# Patient Record
Sex: Female | Born: 1935 | Race: Black or African American | Hispanic: No | Smoking: Never smoker
Health system: Southern US, Community
[De-identification: ages and names within clinical notes are randomized; demographics above are authoritative.]

## PROBLEM LIST (undated history)

## (undated) DIAGNOSIS — I10 Essential (primary) hypertension: Secondary | ICD-10-CM

## (undated) DIAGNOSIS — I671 Cerebral aneurysm, nonruptured: Secondary | ICD-10-CM

## (undated) DIAGNOSIS — M545 Low back pain: Secondary | ICD-10-CM

## (undated) DIAGNOSIS — R209 Unspecified disturbances of skin sensation: Secondary | ICD-10-CM

## (undated) DIAGNOSIS — R0789 Other chest pain: Secondary | ICD-10-CM

## (undated) DIAGNOSIS — Z8679 Personal history of other diseases of the circulatory system: Secondary | ICD-10-CM

## (undated) DIAGNOSIS — T7840XA Allergy, unspecified, initial encounter: Secondary | ICD-10-CM

## (undated) DIAGNOSIS — Z8719 Personal history of other diseases of the digestive system: Secondary | ICD-10-CM

## (undated) DIAGNOSIS — I872 Venous insufficiency (chronic) (peripheral): Secondary | ICD-10-CM

## (undated) DIAGNOSIS — D649 Anemia, unspecified: Secondary | ICD-10-CM

## (undated) DIAGNOSIS — G4700 Insomnia, unspecified: Secondary | ICD-10-CM

## (undated) DIAGNOSIS — I639 Cerebral infarction, unspecified: Secondary | ICD-10-CM

## (undated) DIAGNOSIS — D696 Thrombocytopenia, unspecified: Secondary | ICD-10-CM

## (undated) DIAGNOSIS — M542 Cervicalgia: Secondary | ICD-10-CM

## (undated) DIAGNOSIS — K219 Gastro-esophageal reflux disease without esophagitis: Secondary | ICD-10-CM

## (undated) DIAGNOSIS — IMO0001 Reserved for inherently not codable concepts without codable children: Secondary | ICD-10-CM

## (undated) DIAGNOSIS — Z0000 Encounter for general adult medical examination without abnormal findings: Secondary | ICD-10-CM

## (undated) DIAGNOSIS — F329 Major depressive disorder, single episode, unspecified: Secondary | ICD-10-CM

## (undated) DIAGNOSIS — R51 Headache: Secondary | ICD-10-CM

## (undated) DIAGNOSIS — J984 Other disorders of lung: Secondary | ICD-10-CM

## (undated) DIAGNOSIS — E162 Hypoglycemia, unspecified: Secondary | ICD-10-CM

## (undated) DIAGNOSIS — E785 Hyperlipidemia, unspecified: Secondary | ICD-10-CM

## (undated) DIAGNOSIS — M1990 Unspecified osteoarthritis, unspecified site: Secondary | ICD-10-CM

## (undated) DIAGNOSIS — K589 Irritable bowel syndrome without diarrhea: Secondary | ICD-10-CM

## (undated) DIAGNOSIS — R269 Unspecified abnormalities of gait and mobility: Secondary | ICD-10-CM

## (undated) DIAGNOSIS — M797 Fibromyalgia: Secondary | ICD-10-CM

## (undated) DIAGNOSIS — D72819 Decreased white blood cell count, unspecified: Secondary | ICD-10-CM

## (undated) DIAGNOSIS — M79609 Pain in unspecified limb: Secondary | ICD-10-CM

## (undated) DIAGNOSIS — K5790 Diverticulosis of intestine, part unspecified, without perforation or abscess without bleeding: Secondary | ICD-10-CM

## (undated) DIAGNOSIS — K5909 Other constipation: Secondary | ICD-10-CM

## (undated) DIAGNOSIS — Z87898 Personal history of other specified conditions: Secondary | ICD-10-CM

## (undated) DIAGNOSIS — E039 Hypothyroidism, unspecified: Secondary | ICD-10-CM

## (undated) DIAGNOSIS — F411 Generalized anxiety disorder: Secondary | ICD-10-CM

## (undated) DIAGNOSIS — F32A Depression, unspecified: Secondary | ICD-10-CM

## (undated) DIAGNOSIS — J309 Allergic rhinitis, unspecified: Secondary | ICD-10-CM

## (undated) DIAGNOSIS — M5430 Sciatica, unspecified side: Secondary | ICD-10-CM

## (undated) DIAGNOSIS — K5730 Diverticulosis of large intestine without perforation or abscess without bleeding: Secondary | ICD-10-CM

## (undated) HISTORY — PX: ANEURYSM COILING: SHX5349

## (undated) HISTORY — DX: Low back pain: M54.5

## (undated) HISTORY — DX: Fibromyalgia: M79.7

## (undated) HISTORY — DX: Generalized anxiety disorder: F41.1

## (undated) HISTORY — PX: JOINT REPLACEMENT: SHX530

## (undated) HISTORY — DX: Cervicalgia: M54.2

## (undated) HISTORY — PX: CHOLECYSTECTOMY: SHX55

## (undated) HISTORY — PX: ABDOMINAL HYSTERECTOMY: SHX81

## (undated) HISTORY — PX: APPENDECTOMY: SHX54

## (undated) HISTORY — DX: Allergic rhinitis, unspecified: J30.9

## (undated) HISTORY — DX: Pain in unspecified limb: M79.609

## (undated) HISTORY — DX: Encounter for general adult medical examination without abnormal findings: Z00.00

## (undated) HISTORY — DX: Unspecified abnormalities of gait and mobility: R26.9

## (undated) HISTORY — DX: Anemia, unspecified: D64.9

## (undated) HISTORY — DX: Hypothyroidism, unspecified: E03.9

## (undated) HISTORY — DX: Essential (primary) hypertension: I10

## (undated) HISTORY — DX: Irritable bowel syndrome without diarrhea: K58.9

## (undated) HISTORY — DX: Gastro-esophageal reflux disease without esophagitis: K21.9

## (undated) HISTORY — DX: Venous insufficiency (chronic) (peripheral): I87.2

## (undated) HISTORY — DX: Decreased white blood cell count, unspecified: D72.819

## (undated) HISTORY — DX: Unspecified disturbances of skin sensation: R20.9

## (undated) HISTORY — DX: Insomnia, unspecified: G47.00

## (undated) HISTORY — DX: Thrombocytopenia, unspecified: D69.6

## (undated) HISTORY — DX: Personal history of other diseases of the circulatory system: Z86.79

## (undated) HISTORY — DX: Allergy, unspecified, initial encounter: T78.40XA

## (undated) HISTORY — DX: Headache: R51

## (undated) HISTORY — DX: Major depressive disorder, single episode, unspecified: F32.9

## (undated) HISTORY — DX: Personal history of other diseases of the digestive system: Z87.19

## (undated) HISTORY — DX: Other disorders of lung: J98.4

## (undated) HISTORY — DX: Personal history of other specified conditions: Z87.898

## (undated) HISTORY — DX: Other constipation: K59.09

## (undated) HISTORY — DX: Cerebral infarction, unspecified: I63.9

## (undated) HISTORY — DX: Other chest pain: R07.89

## (undated) HISTORY — DX: Cerebral aneurysm, nonruptured: I67.1

## (undated) HISTORY — DX: Irritable bowel syndrome, unspecified: K58.9

## (undated) HISTORY — DX: Hypoglycemia, unspecified: E16.2

## (undated) HISTORY — PX: KNEE SURGERY: SHX244

## (undated) HISTORY — DX: Unspecified osteoarthritis, unspecified site: M19.90

## (undated) HISTORY — DX: Sciatica, unspecified side: M54.30

## (undated) HISTORY — DX: Diverticulosis of intestine, part unspecified, without perforation or abscess without bleeding: K57.90

## (undated) HISTORY — DX: Hyperlipidemia, unspecified: E78.5

## (undated) HISTORY — DX: Depression, unspecified: F32.A

## (undated) HISTORY — DX: Reserved for inherently not codable concepts without codable children: IMO0001

## (undated) HISTORY — DX: Diverticulosis of large intestine without perforation or abscess without bleeding: K57.30

---

## 1998-05-19 ENCOUNTER — Other Ambulatory Visit: Admission: RE | Admit: 1998-05-19 | Discharge: 1998-05-19 | Payer: Self-pay | Admitting: Dermatology

## 1998-06-14 ENCOUNTER — Other Ambulatory Visit: Admission: RE | Admit: 1998-06-14 | Discharge: 1998-06-14 | Payer: Self-pay | Admitting: Obstetrics & Gynecology

## 1998-08-28 ENCOUNTER — Emergency Department (HOSPITAL_COMMUNITY): Admission: EM | Admit: 1998-08-28 | Discharge: 1998-08-28 | Payer: Self-pay | Admitting: Emergency Medicine

## 1998-09-10 ENCOUNTER — Emergency Department (HOSPITAL_COMMUNITY): Admission: EM | Admit: 1998-09-10 | Discharge: 1998-09-10 | Payer: Self-pay | Admitting: Emergency Medicine

## 1998-10-31 ENCOUNTER — Emergency Department (HOSPITAL_COMMUNITY): Admission: EM | Admit: 1998-10-31 | Discharge: 1998-10-31 | Payer: Self-pay | Admitting: Emergency Medicine

## 1998-11-11 ENCOUNTER — Emergency Department (HOSPITAL_COMMUNITY): Admission: EM | Admit: 1998-11-11 | Discharge: 1998-11-11 | Payer: Self-pay | Admitting: Emergency Medicine

## 1998-11-19 ENCOUNTER — Emergency Department (HOSPITAL_COMMUNITY): Admission: EM | Admit: 1998-11-19 | Discharge: 1998-11-19 | Payer: Self-pay

## 1998-12-25 ENCOUNTER — Emergency Department (HOSPITAL_COMMUNITY): Admission: EM | Admit: 1998-12-25 | Discharge: 1998-12-25 | Payer: Self-pay | Admitting: Endocrinology

## 1999-01-11 ENCOUNTER — Other Ambulatory Visit: Admission: RE | Admit: 1999-01-11 | Discharge: 1999-01-11 | Payer: Self-pay | Admitting: Gastroenterology

## 1999-04-20 ENCOUNTER — Other Ambulatory Visit: Admission: RE | Admit: 1999-04-20 | Discharge: 1999-04-20 | Payer: Self-pay | Admitting: Obstetrics & Gynecology

## 2000-03-22 ENCOUNTER — Emergency Department (HOSPITAL_COMMUNITY): Admission: EM | Admit: 2000-03-22 | Discharge: 2000-03-22 | Payer: Self-pay | Admitting: Emergency Medicine

## 2000-04-19 ENCOUNTER — Other Ambulatory Visit: Admission: RE | Admit: 2000-04-19 | Discharge: 2000-04-19 | Payer: Self-pay | Admitting: Obstetrics & Gynecology

## 2000-10-05 ENCOUNTER — Emergency Department (HOSPITAL_COMMUNITY): Admission: EM | Admit: 2000-10-05 | Discharge: 2000-10-05 | Payer: Self-pay | Admitting: Emergency Medicine

## 2002-03-30 ENCOUNTER — Encounter: Admission: RE | Admit: 2002-03-30 | Discharge: 2002-03-30 | Payer: Self-pay | Admitting: Neurology

## 2002-05-22 ENCOUNTER — Other Ambulatory Visit: Admission: RE | Admit: 2002-05-22 | Discharge: 2002-05-22 | Payer: Self-pay | Admitting: Obstetrics & Gynecology

## 2003-06-12 ENCOUNTER — Ambulatory Visit (HOSPITAL_COMMUNITY): Admission: RE | Admit: 2003-06-12 | Discharge: 2003-06-12 | Payer: Self-pay | Admitting: Neurology

## 2004-02-08 ENCOUNTER — Emergency Department (HOSPITAL_COMMUNITY): Admission: EM | Admit: 2004-02-08 | Discharge: 2004-02-09 | Payer: Self-pay | Admitting: Emergency Medicine

## 2004-04-13 ENCOUNTER — Inpatient Hospital Stay (HOSPITAL_COMMUNITY): Admission: EM | Admit: 2004-04-13 | Discharge: 2004-04-17 | Payer: Self-pay | Admitting: Emergency Medicine

## 2004-04-14 ENCOUNTER — Encounter: Payer: Self-pay | Admitting: Cardiology

## 2004-04-26 ENCOUNTER — Ambulatory Visit (HOSPITAL_COMMUNITY): Admission: RE | Admit: 2004-04-26 | Discharge: 2004-04-26 | Payer: Self-pay | Admitting: Orthopedic Surgery

## 2004-06-06 ENCOUNTER — Encounter: Admission: RE | Admit: 2004-06-06 | Discharge: 2004-08-03 | Payer: Self-pay | Admitting: Pediatrics

## 2004-06-12 ENCOUNTER — Ambulatory Visit (HOSPITAL_COMMUNITY): Admission: RE | Admit: 2004-06-12 | Discharge: 2004-06-12 | Payer: Self-pay | Admitting: Gastroenterology

## 2004-06-20 ENCOUNTER — Encounter: Admission: RE | Admit: 2004-06-20 | Discharge: 2004-06-20 | Payer: Self-pay | Admitting: Rheumatology

## 2004-07-11 ENCOUNTER — Ambulatory Visit (HOSPITAL_COMMUNITY): Admission: RE | Admit: 2004-07-11 | Discharge: 2004-07-11 | Payer: Self-pay | Admitting: Neurology

## 2004-07-19 ENCOUNTER — Other Ambulatory Visit: Admission: RE | Admit: 2004-07-19 | Discharge: 2004-07-19 | Payer: Self-pay | Admitting: Obstetrics & Gynecology

## 2004-07-24 ENCOUNTER — Encounter: Admission: RE | Admit: 2004-07-24 | Discharge: 2004-07-24 | Payer: Self-pay | Admitting: Interventional Radiology

## 2004-08-01 ENCOUNTER — Emergency Department (HOSPITAL_COMMUNITY): Admission: EM | Admit: 2004-08-01 | Discharge: 2004-08-01 | Payer: Self-pay | Admitting: Emergency Medicine

## 2004-08-22 ENCOUNTER — Ambulatory Visit (HOSPITAL_COMMUNITY): Admission: RE | Admit: 2004-08-22 | Discharge: 2004-08-22 | Payer: Self-pay | Admitting: Interventional Radiology

## 2004-08-24 ENCOUNTER — Ambulatory Visit (HOSPITAL_COMMUNITY): Admission: RE | Admit: 2004-08-24 | Discharge: 2004-08-24 | Payer: Self-pay | Admitting: Interventional Radiology

## 2004-09-07 ENCOUNTER — Ambulatory Visit (HOSPITAL_COMMUNITY): Admission: RE | Admit: 2004-09-07 | Discharge: 2004-09-10 | Payer: Self-pay | Admitting: Interventional Radiology

## 2004-09-21 ENCOUNTER — Inpatient Hospital Stay (HOSPITAL_COMMUNITY): Admission: EM | Admit: 2004-09-21 | Discharge: 2004-09-27 | Payer: Self-pay | Admitting: Emergency Medicine

## 2004-09-21 ENCOUNTER — Ambulatory Visit: Payer: Self-pay | Admitting: Physical Medicine & Rehabilitation

## 2004-10-18 ENCOUNTER — Ambulatory Visit: Payer: Self-pay | Admitting: Pulmonary Disease

## 2004-10-26 ENCOUNTER — Encounter: Admission: RE | Admit: 2004-10-26 | Discharge: 2004-12-21 | Payer: Self-pay | Admitting: Neurology

## 2004-11-01 ENCOUNTER — Ambulatory Visit: Payer: Self-pay | Admitting: Pulmonary Disease

## 2004-12-20 ENCOUNTER — Ambulatory Visit: Payer: Self-pay | Admitting: Pulmonary Disease

## 2004-12-26 ENCOUNTER — Ambulatory Visit: Payer: Self-pay | Admitting: Pulmonary Disease

## 2005-01-11 ENCOUNTER — Ambulatory Visit: Payer: Self-pay | Admitting: Pulmonary Disease

## 2005-01-29 ENCOUNTER — Ambulatory Visit (HOSPITAL_COMMUNITY): Admission: RE | Admit: 2005-01-29 | Discharge: 2005-01-29 | Payer: Self-pay | Admitting: Interventional Radiology

## 2005-02-15 ENCOUNTER — Ambulatory Visit: Payer: Self-pay | Admitting: Gastroenterology

## 2005-02-26 ENCOUNTER — Ambulatory Visit: Payer: Self-pay | Admitting: Pulmonary Disease

## 2005-03-01 ENCOUNTER — Ambulatory Visit: Payer: Self-pay | Admitting: Gastroenterology

## 2005-03-28 ENCOUNTER — Ambulatory Visit: Payer: Self-pay | Admitting: Gastroenterology

## 2005-04-05 ENCOUNTER — Encounter: Admission: RE | Admit: 2005-04-05 | Discharge: 2005-07-04 | Payer: Self-pay | Admitting: Rheumatology

## 2005-04-09 ENCOUNTER — Encounter: Payer: Self-pay | Admitting: Internal Medicine

## 2005-04-09 ENCOUNTER — Ambulatory Visit: Payer: Self-pay | Admitting: Gastroenterology

## 2005-05-01 ENCOUNTER — Ambulatory Visit: Payer: Self-pay | Admitting: Gastroenterology

## 2005-05-14 ENCOUNTER — Ambulatory Visit: Payer: Self-pay | Admitting: Pulmonary Disease

## 2005-05-18 ENCOUNTER — Encounter: Admission: RE | Admit: 2005-05-18 | Discharge: 2005-05-18 | Payer: Self-pay | Admitting: Gastroenterology

## 2005-05-28 ENCOUNTER — Encounter: Admission: RE | Admit: 2005-05-28 | Discharge: 2005-05-28 | Payer: Self-pay | Admitting: Internal Medicine

## 2005-06-04 ENCOUNTER — Emergency Department (HOSPITAL_COMMUNITY): Admission: EM | Admit: 2005-06-04 | Discharge: 2005-06-04 | Payer: Self-pay | Admitting: Emergency Medicine

## 2005-08-15 ENCOUNTER — Ambulatory Visit: Payer: Self-pay | Admitting: Internal Medicine

## 2005-08-30 ENCOUNTER — Ambulatory Visit: Payer: Self-pay | Admitting: Internal Medicine

## 2005-09-11 ENCOUNTER — Ambulatory Visit: Payer: Self-pay | Admitting: Internal Medicine

## 2005-09-24 ENCOUNTER — Ambulatory Visit: Payer: Self-pay | Admitting: Internal Medicine

## 2005-11-13 ENCOUNTER — Ambulatory Visit: Payer: Self-pay | Admitting: Internal Medicine

## 2005-12-05 ENCOUNTER — Ambulatory Visit: Payer: Self-pay | Admitting: Internal Medicine

## 2005-12-10 ENCOUNTER — Ambulatory Visit (HOSPITAL_COMMUNITY): Admission: RE | Admit: 2005-12-10 | Discharge: 2005-12-10 | Payer: Self-pay | Admitting: Interventional Radiology

## 2006-02-27 ENCOUNTER — Ambulatory Visit: Payer: Self-pay | Admitting: Internal Medicine

## 2006-02-28 ENCOUNTER — Ambulatory Visit: Payer: Self-pay | Admitting: Internal Medicine

## 2006-03-12 ENCOUNTER — Ambulatory Visit: Payer: Self-pay | Admitting: Hematology and Oncology

## 2006-03-13 LAB — COMPREHENSIVE METABOLIC PANEL
AST: 17 U/L (ref 0–37)
Albumin: 4.1 g/dL (ref 3.5–5.2)
BUN: 10 mg/dL (ref 6–23)
CO2: 27 mEq/L (ref 19–32)
Calcium: 8.9 mg/dL (ref 8.4–10.5)
Chloride: 103 mEq/L (ref 96–112)
Glucose, Bld: 100 mg/dL — ABNORMAL HIGH (ref 70–99)
Potassium: 4.4 mEq/L (ref 3.5–5.3)

## 2006-03-13 LAB — CBC WITH DIFFERENTIAL/PLATELET
Basophils Absolute: 0 10*3/uL (ref 0.0–0.1)
Eosinophils Absolute: 0.1 10*3/uL (ref 0.0–0.5)
HGB: 10.9 g/dL — ABNORMAL LOW (ref 11.6–15.9)
MONO#: 0.5 10*3/uL (ref 0.1–0.9)
NEUT#: 1.3 10*3/uL — ABNORMAL LOW (ref 1.5–6.5)
RDW: 15.1 % — ABNORMAL HIGH (ref 11.3–14.5)
lymph#: 1.6 10*3/uL (ref 0.9–3.3)

## 2006-03-13 LAB — RETICULOCYTES: RETIC #: 24.8 10*3/uL (ref 19.7–115.1)

## 2006-03-20 LAB — ERYTHROPOIETIN: Erythropoietin: 17.1 m[IU]/mL (ref 2.6–34.0)

## 2006-03-20 LAB — IRON AND TIBC: TIBC: 237 ug/dL — ABNORMAL LOW (ref 250–470)

## 2006-03-20 LAB — FOLATE RBC: RBC Folate: 593 ng/mL (ref 180–600)

## 2006-03-20 LAB — IMMUNOFIXATION ELECTROPHORESIS: IgG (Immunoglobin G), Serum: 1780 mg/dL — ABNORMAL HIGH (ref 694–1618)

## 2006-03-27 ENCOUNTER — Encounter: Payer: Self-pay | Admitting: Emergency Medicine

## 2006-04-02 ENCOUNTER — Ambulatory Visit: Payer: Self-pay | Admitting: Internal Medicine

## 2006-04-04 ENCOUNTER — Encounter (INDEPENDENT_AMBULATORY_CARE_PROVIDER_SITE_OTHER): Payer: Self-pay | Admitting: *Deleted

## 2006-04-04 ENCOUNTER — Other Ambulatory Visit: Admission: RE | Admit: 2006-04-04 | Discharge: 2006-04-04 | Payer: Self-pay | Admitting: Hematology and Oncology

## 2006-04-04 LAB — CBC WITH DIFFERENTIAL/PLATELET
BASO%: 0.6 % (ref 0.0–2.0)
Basophils Absolute: 0 10*3/uL (ref 0.0–0.1)
EOS%: 2.9 % (ref 0.0–7.0)
Eosinophils Absolute: 0.1 10*3/uL (ref 0.0–0.5)
HCT: 34.2 % — ABNORMAL LOW (ref 34.8–46.6)
HGB: 10.8 g/dL — ABNORMAL LOW (ref 11.6–15.9)
LYMPH%: 35.6 % (ref 14.0–48.0)
MCH: 26.3 pg (ref 26.0–34.0)
MCHC: 31.7 g/dL — ABNORMAL LOW (ref 32.0–36.0)
MCV: 83 fL (ref 81.0–101.0)
MONO#: 0.6 10*3/uL (ref 0.1–0.9)
MONO%: 14.6 % — ABNORMAL HIGH (ref 0.0–13.0)
NEUT#: 1.8 10*3/uL (ref 1.5–6.5)
NEUT%: 46.3 % (ref 39.6–76.8)
Platelets: 186 10*3/uL (ref 145–400)
RBC: 4.12 10*6/uL (ref 3.70–5.32)
RDW: 15.4 % — ABNORMAL HIGH (ref 11.3–14.5)
WBC: 3.9 10*3/uL (ref 3.9–10.0)
lymph#: 1.4 10*3/uL (ref 0.9–3.3)

## 2006-04-18 LAB — CBC WITH DIFFERENTIAL/PLATELET
BASO%: 1.9 % (ref 0.0–2.0)
EOS%: 3.5 % (ref 0.0–7.0)
HGB: 11.4 g/dL — ABNORMAL LOW (ref 11.6–15.9)
MCH: 26.7 pg (ref 26.0–34.0)
MCHC: 32.2 g/dL (ref 32.0–36.0)
MONO#: 0.7 10*3/uL (ref 0.1–0.9)
RDW: 14.4 % (ref 11.3–14.5)
WBC: 4.7 10*3/uL (ref 3.9–10.0)
lymph#: 1.6 10*3/uL (ref 0.9–3.3)

## 2006-05-15 ENCOUNTER — Ambulatory Visit: Payer: Self-pay | Admitting: Internal Medicine

## 2006-06-04 ENCOUNTER — Ambulatory Visit: Payer: Self-pay | Admitting: Internal Medicine

## 2006-08-23 ENCOUNTER — Encounter: Admission: RE | Admit: 2006-08-23 | Discharge: 2006-08-23 | Payer: Self-pay | Admitting: Obstetrics & Gynecology

## 2006-08-27 ENCOUNTER — Ambulatory Visit: Payer: Self-pay | Admitting: Internal Medicine

## 2006-10-08 ENCOUNTER — Ambulatory Visit: Payer: Self-pay | Admitting: Internal Medicine

## 2006-11-25 ENCOUNTER — Ambulatory Visit: Payer: Self-pay | Admitting: Internal Medicine

## 2006-12-09 ENCOUNTER — Ambulatory Visit (HOSPITAL_COMMUNITY): Admission: RE | Admit: 2006-12-09 | Discharge: 2006-12-09 | Payer: Self-pay | Admitting: Interventional Radiology

## 2007-01-07 ENCOUNTER — Ambulatory Visit: Payer: Self-pay | Admitting: Internal Medicine

## 2007-01-07 LAB — CONVERTED CEMR LAB
Basophils Absolute: 0 10*3/uL (ref 0.0–0.1)
Eosinophils Relative: 3.2 % (ref 0.0–5.0)
HCT: 33.7 % — ABNORMAL LOW (ref 36.0–46.0)
Neutrophils Relative %: 42.8 % — ABNORMAL LOW (ref 43.0–77.0)
Platelets: 189 10*3/uL (ref 150–400)
RBC: 3.96 M/uL (ref 3.87–5.11)
RDW: 13.5 % (ref 11.5–14.6)
WBC: 3.8 10*3/uL — ABNORMAL LOW (ref 4.5–10.5)

## 2007-03-31 ENCOUNTER — Ambulatory Visit: Payer: Self-pay | Admitting: Internal Medicine

## 2007-03-31 ENCOUNTER — Ambulatory Visit: Payer: Self-pay | Admitting: Cardiovascular Disease

## 2007-03-31 LAB — CONVERTED CEMR LAB
Calcium: 9.2 mg/dL (ref 8.4–10.5)
Chloride: 105 meq/L (ref 96–112)
Creatinine, Ser: 0.8 mg/dL (ref 0.4–1.2)
Sodium: 140 meq/L (ref 135–145)

## 2007-04-15 ENCOUNTER — Ambulatory Visit: Payer: Self-pay | Admitting: Internal Medicine

## 2007-05-09 ENCOUNTER — Ambulatory Visit: Payer: Self-pay | Admitting: Internal Medicine

## 2007-05-27 ENCOUNTER — Encounter: Admission: RE | Admit: 2007-05-27 | Discharge: 2007-05-27 | Payer: Self-pay | Admitting: General Surgery

## 2007-06-13 ENCOUNTER — Ambulatory Visit: Payer: Self-pay | Admitting: Internal Medicine

## 2007-06-21 DIAGNOSIS — E785 Hyperlipidemia, unspecified: Secondary | ICD-10-CM

## 2007-06-21 DIAGNOSIS — F411 Generalized anxiety disorder: Secondary | ICD-10-CM | POA: Insufficient documentation

## 2007-06-21 DIAGNOSIS — M1990 Unspecified osteoarthritis, unspecified site: Secondary | ICD-10-CM | POA: Insufficient documentation

## 2007-06-21 DIAGNOSIS — J309 Allergic rhinitis, unspecified: Secondary | ICD-10-CM

## 2007-06-21 DIAGNOSIS — I671 Cerebral aneurysm, nonruptured: Secondary | ICD-10-CM

## 2007-06-21 DIAGNOSIS — D649 Anemia, unspecified: Secondary | ICD-10-CM

## 2007-06-21 DIAGNOSIS — F329 Major depressive disorder, single episode, unspecified: Secondary | ICD-10-CM

## 2007-06-21 DIAGNOSIS — D72819 Decreased white blood cell count, unspecified: Secondary | ICD-10-CM | POA: Insufficient documentation

## 2007-06-21 DIAGNOSIS — F3289 Other specified depressive episodes: Secondary | ICD-10-CM

## 2007-06-21 DIAGNOSIS — IMO0001 Reserved for inherently not codable concepts without codable children: Secondary | ICD-10-CM

## 2007-06-21 DIAGNOSIS — I10 Essential (primary) hypertension: Secondary | ICD-10-CM

## 2007-06-21 DIAGNOSIS — K589 Irritable bowel syndrome without diarrhea: Secondary | ICD-10-CM

## 2007-06-21 DIAGNOSIS — T466X5A Adverse effect of antihyperlipidemic and antiarteriosclerotic drugs, initial encounter: Secondary | ICD-10-CM | POA: Insufficient documentation

## 2007-06-21 DIAGNOSIS — Z8679 Personal history of other diseases of the circulatory system: Secondary | ICD-10-CM | POA: Insufficient documentation

## 2007-06-21 HISTORY — DX: Personal history of other diseases of the circulatory system: Z86.79

## 2007-06-21 HISTORY — DX: Essential (primary) hypertension: I10

## 2007-06-21 HISTORY — DX: Generalized anxiety disorder: F41.1

## 2007-06-21 HISTORY — DX: Cerebral aneurysm, nonruptured: I67.1

## 2007-06-21 HISTORY — DX: Unspecified osteoarthritis, unspecified site: M19.90

## 2007-06-21 HISTORY — DX: Anemia, unspecified: D64.9

## 2007-06-21 HISTORY — DX: Decreased white blood cell count, unspecified: D72.819

## 2007-06-21 HISTORY — DX: Reserved for inherently not codable concepts without codable children: IMO0001

## 2007-06-21 HISTORY — DX: Allergic rhinitis, unspecified: J30.9

## 2007-06-21 HISTORY — DX: Irritable bowel syndrome, unspecified: K58.9

## 2007-06-21 HISTORY — DX: Major depressive disorder, single episode, unspecified: F32.9

## 2007-06-21 HISTORY — DX: Hyperlipidemia, unspecified: E78.5

## 2007-06-21 HISTORY — DX: Other specified depressive episodes: F32.89

## 2007-07-25 ENCOUNTER — Ambulatory Visit: Payer: Self-pay | Admitting: Internal Medicine

## 2007-08-12 ENCOUNTER — Ambulatory Visit: Payer: Self-pay | Admitting: Internal Medicine

## 2007-08-26 ENCOUNTER — Encounter: Admission: RE | Admit: 2007-08-26 | Discharge: 2007-08-26 | Payer: Self-pay | Admitting: Obstetrics & Gynecology

## 2007-09-16 ENCOUNTER — Ambulatory Visit: Payer: Self-pay | Admitting: Internal Medicine

## 2007-10-15 ENCOUNTER — Telehealth (INDEPENDENT_AMBULATORY_CARE_PROVIDER_SITE_OTHER): Payer: Self-pay | Admitting: *Deleted

## 2007-10-15 ENCOUNTER — Emergency Department (HOSPITAL_COMMUNITY): Admission: EM | Admit: 2007-10-15 | Discharge: 2007-10-15 | Payer: Self-pay | Admitting: Family Medicine

## 2007-11-13 ENCOUNTER — Encounter: Payer: Self-pay | Admitting: Internal Medicine

## 2007-11-26 ENCOUNTER — Telehealth: Payer: Self-pay | Admitting: Internal Medicine

## 2007-12-01 LAB — CONVERTED CEMR LAB

## 2007-12-08 ENCOUNTER — Ambulatory Visit (HOSPITAL_COMMUNITY): Admission: RE | Admit: 2007-12-08 | Discharge: 2007-12-08 | Payer: Self-pay | Admitting: Interventional Radiology

## 2007-12-26 ENCOUNTER — Ambulatory Visit: Payer: Self-pay | Admitting: Internal Medicine

## 2007-12-26 DIAGNOSIS — J984 Other disorders of lung: Secondary | ICD-10-CM

## 2007-12-26 DIAGNOSIS — N898 Other specified noninflammatory disorders of vagina: Secondary | ICD-10-CM | POA: Insufficient documentation

## 2007-12-26 HISTORY — DX: Other disorders of lung: J98.4

## 2007-12-31 ENCOUNTER — Ambulatory Visit: Payer: Self-pay | Admitting: Internal Medicine

## 2007-12-31 ENCOUNTER — Telehealth: Payer: Self-pay | Admitting: Internal Medicine

## 2008-01-05 ENCOUNTER — Ambulatory Visit: Payer: Self-pay | Admitting: Internal Medicine

## 2008-01-06 LAB — CONVERTED CEMR LAB
BUN: 9 mg/dL (ref 6–23)
CO2: 31 meq/L (ref 19–32)
Chloride: 99 meq/L (ref 96–112)
Creatinine, Ser: 0.7 mg/dL (ref 0.4–1.2)
Potassium: 4.2 meq/L (ref 3.5–5.1)

## 2008-01-29 ENCOUNTER — Ambulatory Visit: Payer: Self-pay | Admitting: Endocrinology

## 2008-01-29 DIAGNOSIS — R209 Unspecified disturbances of skin sensation: Secondary | ICD-10-CM

## 2008-01-29 HISTORY — DX: Unspecified disturbances of skin sensation: R20.9

## 2008-01-30 ENCOUNTER — Encounter: Payer: Self-pay | Admitting: Endocrinology

## 2008-01-30 LAB — CONVERTED CEMR LAB
Folate: 16.3 ng/mL
Sed Rate: 28 mm/hr — ABNORMAL HIGH (ref 0–25)
TSH: 2.06 microintl units/mL (ref 0.35–5.50)

## 2008-02-03 ENCOUNTER — Ambulatory Visit: Payer: Self-pay | Admitting: Internal Medicine

## 2008-02-03 DIAGNOSIS — M542 Cervicalgia: Secondary | ICD-10-CM

## 2008-02-03 DIAGNOSIS — J069 Acute upper respiratory infection, unspecified: Secondary | ICD-10-CM | POA: Insufficient documentation

## 2008-02-03 HISTORY — DX: Cervicalgia: M54.2

## 2008-02-04 ENCOUNTER — Ambulatory Visit: Payer: Self-pay

## 2008-04-06 ENCOUNTER — Encounter (INDEPENDENT_AMBULATORY_CARE_PROVIDER_SITE_OTHER): Payer: Self-pay | Admitting: *Deleted

## 2008-04-06 ENCOUNTER — Ambulatory Visit: Payer: Self-pay | Admitting: Internal Medicine

## 2008-04-27 ENCOUNTER — Telehealth: Payer: Self-pay | Admitting: Internal Medicine

## 2008-05-03 DIAGNOSIS — J209 Acute bronchitis, unspecified: Secondary | ICD-10-CM

## 2008-05-03 DIAGNOSIS — I872 Venous insufficiency (chronic) (peripheral): Secondary | ICD-10-CM | POA: Insufficient documentation

## 2008-05-03 DIAGNOSIS — K219 Gastro-esophageal reflux disease without esophagitis: Secondary | ICD-10-CM

## 2008-05-03 HISTORY — DX: Venous insufficiency (chronic) (peripheral): I87.2

## 2008-05-03 HISTORY — DX: Gastro-esophageal reflux disease without esophagitis: K21.9

## 2008-05-04 ENCOUNTER — Ambulatory Visit: Payer: Self-pay | Admitting: Internal Medicine

## 2008-05-04 LAB — CONVERTED CEMR LAB
Eosinophils Relative: 2.2 % (ref 0.0–5.0)
HCT: 35.5 % — ABNORMAL LOW (ref 36.0–46.0)
IgE (Immunoglobulin E), Serum: 18.1 intl units/mL (ref 0.0–180.0)
Lymphocytes Relative: 48.4 % — ABNORMAL HIGH (ref 12.0–46.0)
Monocytes Relative: 8.7 % (ref 3.0–12.0)
Neutrophils Relative %: 40.6 % — ABNORMAL LOW (ref 43.0–77.0)
Platelets: 163 10*3/uL (ref 150–400)
RDW: 13.5 % (ref 11.5–14.6)
WBC: 3.9 10*3/uL — ABNORMAL LOW (ref 4.5–10.5)

## 2008-05-06 ENCOUNTER — Ambulatory Visit: Payer: Self-pay | Admitting: Internal Medicine

## 2008-05-06 DIAGNOSIS — M5430 Sciatica, unspecified side: Secondary | ICD-10-CM

## 2008-05-06 DIAGNOSIS — M5432 Sciatica, left side: Secondary | ICD-10-CM | POA: Insufficient documentation

## 2008-05-06 HISTORY — DX: Sciatica, unspecified side: M54.30

## 2008-05-13 ENCOUNTER — Encounter: Admission: RE | Admit: 2008-05-13 | Discharge: 2008-05-13 | Payer: Self-pay | Admitting: Internal Medicine

## 2008-05-13 ENCOUNTER — Telehealth: Payer: Self-pay | Admitting: Internal Medicine

## 2008-05-20 ENCOUNTER — Ambulatory Visit: Payer: Self-pay | Admitting: Internal Medicine

## 2008-05-21 ENCOUNTER — Telehealth: Payer: Self-pay | Admitting: Internal Medicine

## 2008-05-24 ENCOUNTER — Encounter (INDEPENDENT_AMBULATORY_CARE_PROVIDER_SITE_OTHER): Payer: Self-pay | Admitting: *Deleted

## 2008-05-27 ENCOUNTER — Encounter: Payer: Self-pay | Admitting: Internal Medicine

## 2008-06-02 ENCOUNTER — Encounter: Payer: Self-pay | Admitting: Internal Medicine

## 2008-06-09 DIAGNOSIS — K5730 Diverticulosis of large intestine without perforation or abscess without bleeding: Secondary | ICD-10-CM

## 2008-06-09 HISTORY — DX: Diverticulosis of large intestine without perforation or abscess without bleeding: K57.30

## 2008-06-11 ENCOUNTER — Ambulatory Visit: Payer: Self-pay | Admitting: Internal Medicine

## 2008-06-29 ENCOUNTER — Ambulatory Visit: Payer: Self-pay | Admitting: Internal Medicine

## 2008-07-12 ENCOUNTER — Ambulatory Visit: Payer: Self-pay | Admitting: Internal Medicine

## 2008-07-12 LAB — CONVERTED CEMR LAB
AST: 23 units/L (ref 0–37)
Basophils Absolute: 0.1 10*3/uL (ref 0.0–0.1)
Basophils Relative: 1.1 % (ref 0.0–3.0)
Cholesterol: 184 mg/dL (ref 0–200)
Eosinophils Absolute: 0.4 10*3/uL (ref 0.0–0.7)
HDL: 64.4 mg/dL (ref >39.0)
LDL Cholesterol: 112 mg/dL — ABNORMAL HIGH (ref 0–99)
Lymphocytes Relative: 52.5 % — ABNORMAL HIGH (ref 12.0–46.0)
MCHC: 32.6 g/dL (ref 30.0–36.0)
MCV: 85.8 fL (ref 78.0–100.0)
Neutrophils Relative %: 29.8 % — ABNORMAL LOW (ref 43.0–77.0)
RDW: 13.4 % (ref 11.5–14.6)
TSH: 5.12 microintl units/mL (ref 0.35–5.50)
Triglycerides: 40 mg/dL (ref 0–149)
VLDL: 8 mg/dL (ref 0–40)

## 2008-07-13 ENCOUNTER — Encounter: Payer: Self-pay | Admitting: Internal Medicine

## 2008-07-19 ENCOUNTER — Encounter: Payer: Self-pay | Admitting: Internal Medicine

## 2008-07-20 ENCOUNTER — Telehealth: Payer: Self-pay | Admitting: Internal Medicine

## 2008-07-22 ENCOUNTER — Ambulatory Visit: Payer: Self-pay | Admitting: Internal Medicine

## 2008-07-22 DIAGNOSIS — E039 Hypothyroidism, unspecified: Secondary | ICD-10-CM

## 2008-07-22 HISTORY — DX: Hypothyroidism, unspecified: E03.9

## 2008-08-13 ENCOUNTER — Encounter: Payer: Self-pay | Admitting: Internal Medicine

## 2008-08-13 ENCOUNTER — Encounter: Payer: Self-pay | Admitting: Pulmonary Disease

## 2008-08-25 DIAGNOSIS — Z8719 Personal history of other diseases of the digestive system: Secondary | ICD-10-CM

## 2008-08-25 HISTORY — DX: Personal history of other diseases of the digestive system: Z87.19

## 2008-08-26 ENCOUNTER — Ambulatory Visit: Payer: Self-pay | Admitting: Internal Medicine

## 2008-08-26 LAB — CONVERTED CEMR LAB: Thyroperoxidase Ab SerPl-aCnc: <25 (ref 0.0–60.0)

## 2008-08-27 ENCOUNTER — Encounter: Payer: Self-pay | Admitting: Internal Medicine

## 2008-09-01 ENCOUNTER — Ambulatory Visit: Payer: Self-pay | Admitting: Internal Medicine

## 2008-09-02 ENCOUNTER — Ambulatory Visit: Payer: Self-pay | Admitting: Internal Medicine

## 2008-09-02 ENCOUNTER — Ambulatory Visit (HOSPITAL_BASED_OUTPATIENT_CLINIC_OR_DEPARTMENT_OTHER): Admission: RE | Admit: 2008-09-02 | Discharge: 2008-09-02 | Payer: Self-pay | Admitting: Internal Medicine

## 2008-09-02 DIAGNOSIS — R0789 Other chest pain: Secondary | ICD-10-CM | POA: Insufficient documentation

## 2008-09-02 HISTORY — DX: Other chest pain: R07.89

## 2008-09-08 ENCOUNTER — Ambulatory Visit: Payer: Self-pay | Admitting: Cardiology

## 2008-09-21 ENCOUNTER — Encounter: Admission: RE | Admit: 2008-09-21 | Discharge: 2008-09-21 | Payer: Self-pay | Admitting: Internal Medicine

## 2008-09-21 LAB — HM MAMMOGRAPHY: HM Mammogram: NORMAL

## 2008-09-26 DIAGNOSIS — R0789 Other chest pain: Secondary | ICD-10-CM

## 2008-09-26 HISTORY — DX: Other chest pain: R07.89

## 2008-10-14 ENCOUNTER — Ambulatory Visit: Payer: Self-pay | Admitting: Internal Medicine

## 2008-10-15 ENCOUNTER — Ambulatory Visit: Payer: Self-pay

## 2008-10-15 ENCOUNTER — Encounter: Payer: Self-pay | Admitting: Internal Medicine

## 2008-10-25 ENCOUNTER — Telehealth: Payer: Self-pay | Admitting: Internal Medicine

## 2008-11-08 ENCOUNTER — Telehealth: Payer: Self-pay | Admitting: Internal Medicine

## 2009-02-01 ENCOUNTER — Encounter: Payer: Self-pay | Admitting: Internal Medicine

## 2009-02-01 ENCOUNTER — Ambulatory Visit: Payer: Self-pay | Admitting: Internal Medicine

## 2009-02-01 ENCOUNTER — Telehealth: Payer: Self-pay | Admitting: Internal Medicine

## 2009-02-01 DIAGNOSIS — M79609 Pain in unspecified limb: Secondary | ICD-10-CM

## 2009-02-01 DIAGNOSIS — R7309 Other abnormal glucose: Secondary | ICD-10-CM

## 2009-02-01 HISTORY — DX: Pain in unspecified limb: M79.609

## 2009-02-01 LAB — CONVERTED CEMR LAB
ALT: 12 units/L (ref 0–35)
AST: 24 units/L (ref 0–37)
Alkaline Phosphatase: 75 units/L (ref 39–117)
Basophils Absolute: 0.1 10*3/uL (ref 0.0–0.1)
Bilirubin, Direct: 0.1 mg/dL (ref 0.0–0.3)
CO2: 29 meq/L (ref 19–32)
Chloride: 103 meq/L (ref 96–112)
Cholesterol: 196 mg/dL (ref 0–200)
Glucose, Bld: 87 mg/dL (ref 70–99)
Hemoglobin: 13.2 g/dL (ref 12.0–15.0)
LDL Cholesterol: 98 mg/dL (ref 0–99)
Lymphocytes Relative: 45.3 % (ref 12.0–46.0)
MCHC: 32.5 g/dL (ref 30.0–36.0)
Monocytes Relative: 11.9 % (ref 3.0–12.0)
Neutrophils Relative %: 38.5 % — ABNORMAL LOW (ref 43.0–77.0)
Potassium: 4.3 meq/L (ref 3.5–5.1)
RBC: 4.75 M/uL (ref 3.87–5.11)
RDW: 13.4 % (ref 11.5–14.6)
Sodium: 139 meq/L (ref 135–145)
Total Bilirubin: 0.7 mg/dL (ref 0.3–1.2)
Total CHOL/HDL Ratio: 2.3
Total Protein: 8 g/dL (ref 6.0–8.3)
Triglycerides: 61 mg/dL (ref 0–149)
VLDL: 12 mg/dL (ref 0–40)

## 2009-02-09 ENCOUNTER — Ambulatory Visit: Payer: Self-pay | Admitting: Internal Medicine

## 2009-02-09 DIAGNOSIS — D696 Thrombocytopenia, unspecified: Secondary | ICD-10-CM

## 2009-02-09 HISTORY — DX: Thrombocytopenia, unspecified: D69.6

## 2009-02-09 LAB — CONVERTED CEMR LAB
Eosinophils Absolute: 0.1 10*3/uL (ref 0.0–0.7)
Eosinophils Relative: 2.6 % (ref 0.0–5.0)
HCT: 34.9 % — ABNORMAL LOW (ref 36.0–46.0)
Lymphs Abs: 1.3 10*3/uL (ref 0.7–4.0)
MCHC: 32.9 g/dL (ref 30.0–36.0)
MCV: 84.8 fL (ref 78.0–100.0)
Monocytes Absolute: 0.6 10*3/uL (ref 0.1–1.0)
Platelets: 167 10*3/uL (ref 150.0–400.0)
Prothrombin Time: 10.6 s — ABNORMAL LOW (ref 10.9–13.3)
RDW: 12.9 % (ref 11.5–14.6)
WBC: 3.8 10*3/uL — ABNORMAL LOW (ref 4.5–10.5)

## 2009-02-11 ENCOUNTER — Telehealth: Payer: Self-pay | Admitting: Internal Medicine

## 2009-02-24 ENCOUNTER — Ambulatory Visit: Payer: Self-pay | Admitting: Internal Medicine

## 2009-02-25 LAB — CONVERTED CEMR LAB: Transferrin: 179.2 mg/dL — ABNORMAL LOW (ref 212.0–360.0)

## 2009-03-29 LAB — CONVERTED CEMR LAB: Pap Smear: NORMAL

## 2009-04-12 ENCOUNTER — Ambulatory Visit: Payer: Self-pay | Admitting: Internal Medicine

## 2009-05-17 ENCOUNTER — Ambulatory Visit: Payer: Self-pay | Admitting: Internal Medicine

## 2009-05-24 ENCOUNTER — Encounter: Payer: Self-pay | Admitting: Internal Medicine

## 2009-06-09 ENCOUNTER — Encounter: Payer: Self-pay | Admitting: Internal Medicine

## 2009-07-19 ENCOUNTER — Ambulatory Visit: Payer: Self-pay | Admitting: Internal Medicine

## 2009-07-19 DIAGNOSIS — G4700 Insomnia, unspecified: Secondary | ICD-10-CM | POA: Insufficient documentation

## 2009-07-19 HISTORY — DX: Insomnia, unspecified: G47.00

## 2009-07-20 ENCOUNTER — Telehealth: Payer: Self-pay | Admitting: Internal Medicine

## 2009-07-21 ENCOUNTER — Inpatient Hospital Stay (HOSPITAL_COMMUNITY): Admission: EM | Admit: 2009-07-21 | Discharge: 2009-07-23 | Payer: Self-pay | Admitting: Emergency Medicine

## 2009-07-21 ENCOUNTER — Ambulatory Visit: Payer: Self-pay | Admitting: Internal Medicine

## 2009-07-21 ENCOUNTER — Emergency Department (HOSPITAL_COMMUNITY): Admission: EM | Admit: 2009-07-21 | Discharge: 2009-07-21 | Payer: Self-pay | Admitting: Emergency Medicine

## 2009-07-22 ENCOUNTER — Encounter: Payer: Self-pay | Admitting: Internal Medicine

## 2009-07-22 ENCOUNTER — Encounter (INDEPENDENT_AMBULATORY_CARE_PROVIDER_SITE_OTHER): Payer: Self-pay | Admitting: Internal Medicine

## 2009-07-22 ENCOUNTER — Ambulatory Visit: Payer: Self-pay | Admitting: Vascular Surgery

## 2009-07-28 ENCOUNTER — Telehealth: Payer: Self-pay | Admitting: Internal Medicine

## 2009-07-28 DIAGNOSIS — R269 Unspecified abnormalities of gait and mobility: Secondary | ICD-10-CM

## 2009-07-28 DIAGNOSIS — R2681 Unsteadiness on feet: Secondary | ICD-10-CM | POA: Insufficient documentation

## 2009-07-28 HISTORY — DX: Unspecified abnormalities of gait and mobility: R26.9

## 2009-08-16 ENCOUNTER — Ambulatory Visit: Payer: Self-pay | Admitting: Internal Medicine

## 2009-08-16 DIAGNOSIS — J329 Chronic sinusitis, unspecified: Secondary | ICD-10-CM | POA: Insufficient documentation

## 2009-09-09 ENCOUNTER — Encounter: Payer: Self-pay | Admitting: Internal Medicine

## 2009-09-20 ENCOUNTER — Ambulatory Visit: Payer: Self-pay | Admitting: Internal Medicine

## 2009-09-20 DIAGNOSIS — K5909 Other constipation: Secondary | ICD-10-CM | POA: Insufficient documentation

## 2009-09-20 HISTORY — DX: Other constipation: K59.09

## 2009-09-21 LAB — CONVERTED CEMR LAB
Bilirubin Urine: NEGATIVE
Hemoglobin, Urine: NEGATIVE
Nitrite: NEGATIVE
pH: 6 (ref 5.0–8.0)

## 2009-10-04 ENCOUNTER — Encounter: Admission: RE | Admit: 2009-10-04 | Discharge: 2009-10-04 | Payer: Self-pay | Admitting: Obstetrics & Gynecology

## 2009-10-18 ENCOUNTER — Ambulatory Visit: Payer: Self-pay | Admitting: Internal Medicine

## 2009-10-24 LAB — CONVERTED CEMR LAB
Specific Gravity, Urine: <=1.005 (ref 1.000–1.030)
Total Protein, Urine: NEGATIVE mg/dL
Urine Glucose: NEGATIVE mg/dL

## 2009-12-08 ENCOUNTER — Ambulatory Visit: Payer: Self-pay | Admitting: Internal Medicine

## 2009-12-08 ENCOUNTER — Encounter (INDEPENDENT_AMBULATORY_CARE_PROVIDER_SITE_OTHER): Payer: Self-pay | Admitting: *Deleted

## 2009-12-08 ENCOUNTER — Telehealth: Payer: Self-pay | Admitting: Internal Medicine

## 2009-12-08 LAB — CONVERTED CEMR LAB
Chloride: 96 meq/L (ref 96–112)
Hgb A1c MFr Bld: 6.1 % (ref 4.6–6.5)
Potassium: 4.1 meq/L (ref 3.5–5.1)

## 2009-12-13 ENCOUNTER — Ambulatory Visit: Payer: Self-pay | Admitting: Internal Medicine

## 2009-12-14 ENCOUNTER — Telehealth: Payer: Self-pay | Admitting: Internal Medicine

## 2010-01-06 ENCOUNTER — Encounter (INDEPENDENT_AMBULATORY_CARE_PROVIDER_SITE_OTHER): Payer: Self-pay | Admitting: *Deleted

## 2010-01-09 ENCOUNTER — Ambulatory Visit: Payer: Self-pay | Admitting: Internal Medicine

## 2010-01-23 ENCOUNTER — Ambulatory Visit: Payer: Self-pay | Admitting: Internal Medicine

## 2010-01-23 LAB — HM COLONOSCOPY

## 2010-01-28 ENCOUNTER — Ambulatory Visit: Payer: Self-pay | Admitting: Family Medicine

## 2010-02-15 ENCOUNTER — Telehealth: Payer: Self-pay | Admitting: Internal Medicine

## 2010-02-16 ENCOUNTER — Encounter: Payer: Self-pay | Admitting: Internal Medicine

## 2010-02-21 ENCOUNTER — Encounter: Payer: Self-pay | Admitting: Internal Medicine

## 2010-03-06 ENCOUNTER — Ambulatory Visit: Payer: Self-pay | Admitting: Internal Medicine

## 2010-03-06 DIAGNOSIS — R82998 Other abnormal findings in urine: Secondary | ICD-10-CM | POA: Insufficient documentation

## 2010-03-06 DIAGNOSIS — M545 Low back pain, unspecified: Secondary | ICD-10-CM

## 2010-03-06 HISTORY — DX: Low back pain, unspecified: M54.50

## 2010-03-06 LAB — CONVERTED CEMR LAB
Bilirubin Urine: NEGATIVE
Blood in Urine, dipstick: NEGATIVE
Glucose, Urine, Semiquant: NEGATIVE
Ketones, urine, test strip: NEGATIVE
Specific Gravity, Urine: <1.005
WBC Urine, dipstick: NEGATIVE
pH: 5

## 2010-03-08 ENCOUNTER — Telehealth: Payer: Self-pay | Admitting: Internal Medicine

## 2010-03-14 ENCOUNTER — Telehealth: Payer: Self-pay | Admitting: Internal Medicine

## 2010-03-14 LAB — CONVERTED CEMR LAB: Pap Smear: NORMAL

## 2010-03-20 ENCOUNTER — Encounter: Payer: Self-pay | Admitting: Internal Medicine

## 2010-03-21 ENCOUNTER — Ambulatory Visit: Payer: Self-pay | Admitting: Family

## 2010-03-23 ENCOUNTER — Encounter (INDEPENDENT_AMBULATORY_CARE_PROVIDER_SITE_OTHER): Payer: Self-pay | Admitting: *Deleted

## 2010-03-28 ENCOUNTER — Encounter: Payer: Self-pay | Admitting: Internal Medicine

## 2010-03-29 ENCOUNTER — Telehealth: Payer: Self-pay | Admitting: Internal Medicine

## 2010-04-06 ENCOUNTER — Telehealth: Payer: Self-pay | Admitting: Internal Medicine

## 2010-04-07 ENCOUNTER — Ambulatory Visit: Payer: Self-pay | Admitting: Internal Medicine

## 2010-04-10 ENCOUNTER — Encounter: Admission: RE | Admit: 2010-04-10 | Discharge: 2010-05-23 | Payer: Self-pay | Admitting: Rheumatology

## 2010-04-12 ENCOUNTER — Encounter
Admission: RE | Admit: 2010-04-12 | Discharge: 2010-07-11 | Payer: Self-pay | Admitting: Physical Medicine and Rehabilitation

## 2010-04-14 ENCOUNTER — Ambulatory Visit: Payer: Self-pay | Admitting: Physical Medicine and Rehabilitation

## 2010-04-14 ENCOUNTER — Telehealth: Payer: Self-pay | Admitting: Internal Medicine

## 2010-04-20 ENCOUNTER — Encounter: Admission: RE | Admit: 2010-04-20 | Discharge: 2010-04-20 | Payer: Self-pay | Admitting: Orthopedic Surgery

## 2010-04-25 ENCOUNTER — Telehealth: Payer: Self-pay | Admitting: Internal Medicine

## 2010-05-01 ENCOUNTER — Ambulatory Visit: Payer: Self-pay | Admitting: Internal Medicine

## 2010-05-01 LAB — CONVERTED CEMR LAB
Chloride: 100 meq/L (ref 96–112)
Creatinine, Ser: 0.7 mg/dL (ref 0.4–1.2)
Hgb A1c MFr Bld: 6 % (ref 4.6–6.5)
Potassium: 4.7 meq/L (ref 3.5–5.1)

## 2010-05-09 ENCOUNTER — Ambulatory Visit: Payer: Self-pay | Admitting: Internal Medicine

## 2010-05-12 ENCOUNTER — Ambulatory Visit: Payer: Self-pay | Admitting: Physical Medicine and Rehabilitation

## 2010-06-07 ENCOUNTER — Ambulatory Visit: Payer: Self-pay | Admitting: Physical Medicine and Rehabilitation

## 2010-06-09 LAB — HM DIABETES EYE EXAM: HM Diabetic Eye Exam: NORMAL

## 2010-06-13 ENCOUNTER — Telehealth: Payer: Self-pay | Admitting: Internal Medicine

## 2010-06-13 ENCOUNTER — Ambulatory Visit: Payer: Self-pay | Admitting: Internal Medicine

## 2010-06-21 ENCOUNTER — Ambulatory Visit: Payer: Self-pay | Admitting: Physical Medicine and Rehabilitation

## 2010-06-28 ENCOUNTER — Encounter: Payer: Self-pay | Admitting: Internal Medicine

## 2010-07-11 ENCOUNTER — Ambulatory Visit: Payer: Self-pay | Admitting: Internal Medicine

## 2010-07-11 LAB — CONVERTED CEMR LAB
Glucose, Urine, Semiquant: NEGATIVE
Protein, U semiquant: NEGATIVE
WBC Urine, dipstick: NEGATIVE

## 2010-07-12 ENCOUNTER — Encounter: Payer: Self-pay | Admitting: Internal Medicine

## 2010-07-12 LAB — CONVERTED CEMR LAB
Bilirubin Urine: NEGATIVE
Hemoglobin, Urine: NEGATIVE
Ketones, ur: NEGATIVE mg/dL
Urine Glucose: NEGATIVE mg/dL
Urobilinogen, UA: 0.2 (ref 0.0–1.0)

## 2010-07-13 ENCOUNTER — Telehealth: Payer: Self-pay | Admitting: Internal Medicine

## 2010-08-04 ENCOUNTER — Telehealth: Payer: Self-pay | Admitting: Internal Medicine

## 2010-09-05 ENCOUNTER — Emergency Department (HOSPITAL_COMMUNITY): Admission: EM | Admit: 2010-09-05 | Discharge: 2010-09-05 | Payer: Self-pay | Admitting: Family Medicine

## 2010-10-04 ENCOUNTER — Telehealth: Payer: Self-pay | Admitting: Internal Medicine

## 2010-10-17 ENCOUNTER — Ambulatory Visit: Payer: Self-pay | Admitting: Internal Medicine

## 2010-10-17 LAB — CONVERTED CEMR LAB
BUN: 9 mg/dL (ref 6–23)
Basophils Absolute: 0 10*3/uL (ref 0.0–0.1)
Basophils Relative: 1 % (ref 0–1)
Calcium: 9.7 mg/dL (ref 8.4–10.5)
Creatinine, Ser: 0.83 mg/dL (ref 0.40–1.20)
Eosinophils Absolute: 0.1 10*3/uL (ref 0.0–0.7)
Eosinophils Relative: 3 % (ref 0–5)
HCT: 37.8 % (ref 36.0–46.0)
Hemoglobin: 12 g/dL (ref 12.0–15.0)
MCHC: 31.7 g/dL (ref 30.0–36.0)
MCV: 82.2 fL (ref 78.0–100.0)
Monocytes Absolute: 0.5 10*3/uL (ref 0.1–1.0)
RDW: 14.6 % (ref 11.5–15.5)

## 2010-10-23 ENCOUNTER — Encounter: Payer: Self-pay | Admitting: Internal Medicine

## 2010-10-23 ENCOUNTER — Ambulatory Visit: Payer: Self-pay

## 2010-10-23 ENCOUNTER — Telehealth: Payer: Self-pay | Admitting: Internal Medicine

## 2010-10-24 ENCOUNTER — Telehealth: Payer: Self-pay | Admitting: Internal Medicine

## 2010-10-24 ENCOUNTER — Encounter: Payer: Self-pay | Admitting: Internal Medicine

## 2010-10-24 DIAGNOSIS — E162 Hypoglycemia, unspecified: Secondary | ICD-10-CM

## 2010-10-24 HISTORY — DX: Hypoglycemia, unspecified: E16.2

## 2010-10-26 ENCOUNTER — Telehealth: Payer: Self-pay | Admitting: Internal Medicine

## 2010-10-27 ENCOUNTER — Encounter: Payer: Self-pay | Admitting: Internal Medicine

## 2010-10-27 LAB — CONVERTED CEMR LAB
BUN: 8 mg/dL (ref 6–23)
C-Peptide: 1.76 ng/mL (ref 0.80–3.90)
Calcium: 9 mg/dL (ref 8.4–10.5)
Cortisol - AM: 3.9 ug/dL — ABNORMAL LOW (ref 4.3–22.4)
Potassium: 4.5 meq/L (ref 3.5–5.3)
Sodium: 138 meq/L (ref 135–145)

## 2010-10-30 ENCOUNTER — Encounter: Payer: Self-pay | Admitting: Internal Medicine

## 2010-11-01 ENCOUNTER — Telehealth (INDEPENDENT_AMBULATORY_CARE_PROVIDER_SITE_OTHER): Payer: Self-pay | Admitting: *Deleted

## 2010-11-02 ENCOUNTER — Telehealth: Payer: Self-pay | Admitting: Internal Medicine

## 2010-11-07 ENCOUNTER — Ambulatory Visit: Payer: Self-pay | Admitting: Internal Medicine

## 2010-11-07 LAB — VON WILLEBRAND FACTOR MULTIMER
Factor-VIII Activity: 152 % — ABNORMAL HIGH (ref 75–150)
Von Willebrand Ag: 246 % normal — ABNORMAL HIGH (ref 60–150)
Von Willebrand Multimers: NORMAL

## 2010-11-12 ENCOUNTER — Emergency Department (HOSPITAL_COMMUNITY)
Admission: EM | Admit: 2010-11-12 | Discharge: 2010-11-12 | Payer: Self-pay | Source: Home / Self Care | Admitting: Emergency Medicine

## 2010-11-17 ENCOUNTER — Telehealth: Payer: Self-pay | Admitting: Internal Medicine

## 2010-11-29 ENCOUNTER — Ambulatory Visit: Admit: 2010-11-29 | Payer: Self-pay | Admitting: Physical Medicine and Rehabilitation

## 2010-12-16 ENCOUNTER — Encounter: Payer: Self-pay | Admitting: Obstetrics & Gynecology

## 2010-12-17 ENCOUNTER — Encounter: Payer: Self-pay | Admitting: Internal Medicine

## 2010-12-17 ENCOUNTER — Encounter: Payer: Self-pay | Admitting: Orthopedic Surgery

## 2010-12-26 ENCOUNTER — Telehealth: Payer: Self-pay | Admitting: Internal Medicine

## 2010-12-28 NOTE — Progress Notes (Signed)
Summary: Status Update  Phone Note Call from Patient Call back at Home Phone [phone]   Caller: Patient Call For: D. Thomos Lemons DO Summary of Call: patient called and left voice message stating she was sill having some body aches, and the medication Dr Artist Pais prescribed has not helped any. She would like to know Dr Artist Pais would prescribe her a Z-pak. She states she will be here in the afternoon tomorrow  for blood work and she would like to pick up a rx then.  Initial call taken by: Glendell Docker CMA,  October 26, 2010 11:03 AM  Follow-up for Phone Call        call was returned  to patient, she has pulling sensation in her throat, medication provided is causing stomach pain, she states she is having bad joint pain Follow-up by: Glendell Docker CMA,  October 26, 2010 11:05 AM  Additional Follow-up for Phone Call Additional follow up Details #1::        stop levaquin ok to refill tramadol for pain x 1 If pt having persistent sinus issues,  I suggest pt follow up with her ENT please let us know if she needs referral  (she has seen Dr. Annalee Genta in the past) Additional Follow-up by: D. Thomos Lemons DO,  October 27, 2010 5:07 PM    Additional Follow-up for Phone Call Additional follow up Details #2::    call was returned to patient at [phone], no answer. A detailed voice message was left informing patient rx for Tramadol sent to pharmacy.   Previous message patietnt stated that she has  a follow up appointment with Dr Annalee Genta on 12/22. Will confirm with patient on Monday Glendell Docker [facility]  October 27, 2010 5:16 PM   Additional Follow-up for Phone Call Additional follow up Details #3:: Details for Additional Follow-up Action Taken: call placed to patient at [phone], patient verified that she has stop taking the Levaquin, she is scheduled to follow up with Dr Annalee Genta on 12/22 and she request the rx for Tramadol be sent to [facility].   [facility]  [facility] was cancelled and sent to [facility] per patient request Additional Follow-up by: Glendell Docker CMA,  October 30, 2010 8:27 AM  Prescriptions: TRAMADOL HCL 50 MG TABS (TRAMADOL HCL) Take 1 tablet by mouth once a day as needed back pain  #30 x 1   Entered by:   Glendell Docker CMA   Authorized by:   D. Thomos Lemons DO   Signed by:   Glendell Docker CMA on 10/30/2010   Method used:   Electronically to        [facility]* (retail)       [address]       [place], [place]  [id number]       Ph: [phone]       Fax: [phone]   RxID:   [id number] TRAMADOL HCL 50 MG TABS (TRAMADOL HCL) Take 1 tablet by mouth once a day as needed back pain  #30 x 1   Entered by:   Glendell Docker CMA   Authorized by:   D. Thomos Lemons DO   Signed by:   Glendell Docker CMA on 10/27/2010   Method used:   [facility]        [facility] [address] [phone]* (retail)       [address]  [place], [place]  [address]       Ph: [phone] or [phone]       Fax: [phone]   RxID:   [id number]

## 2010-12-28 NOTE — Progress Notes (Signed)
Summary: Pain Management  Phone Note Other Incoming Call back at [phone]   Caller: Cyndi -Dr Lawrence Santiago Management Summary of Call: Cyndi with Dr Les Pou office called and left voice message stating patient was seen today for a pain management consultation. Her message states patient was not provided any pain medication, pending urine drug screen results.  She states she wanted to make Dr Artist Pais aware in the event the patient calls for a refill on her pain medication. He may want to consider provider her a 1-2 week supply. Initial call taken by: Glendell Docker CMA,  Apr 14, 2010 12:41 PM  Follow-up for Phone Call        noted Follow-up by: D. Thomos Lemons DO,  Apr 14, 2010 1:02 PM     Appended Document: Pain Management Pt reports persistent sciatica pain.  she has been using dilaudid three times a day.  Pt advised to only use pain medication as directed. Pt warned not to self titrate pain medications.    Dondra Spry DO  Apr 14, 2010 4:29 PM   Phone Note Outgoing Call      New/Updated Medications: DILAUDID 2 MG TABS (HYDROMORPHONE HCL) one by mouth three times a day prn Prescriptions: DILAUDID 2 MG TABS (HYDROMORPHONE HCL) one by mouth three times a day prn  #42 x 0   Entered and Authorized by:   D. Thomos Lemons DO   Signed by:   D. Thomos Lemons DO on 04/14/2010   Method used:   Print then Give to Patient   RxID:   [id number]

## 2010-12-28 NOTE — Assessment & Plan Note (Signed)
Summary: 1 MONTH FU/DT--Rm 2   Vital Signs:  Patient profile:   77 year old female Height:      66 inches Weight:      153 pounds BMI:     24.78 Temp:     98.3 degrees F oral Pulse rate:   66 / minute Pulse rhythm:   regular Resp:     12 per minute BP sitting:   130 / 76  (left arm) Cuff size:   regular  Vitals Entered By: Mervin Kung CMA (May 09, 2010 10:17 AM) CC: Room     1 month f/u. Needs written Rxs for: Zegerid, Plavix & Xanax for 90 day supply. Is Patient Diabetic? No   Primary Care Provider:  Dondra Spry, DO  CC:  Room     1 month f/u. Needs written Rxs for: Zegerid and Plavix & Xanax for 90 day supply.Marland Kitchen  History of Present Illness: 77 y/o AA female for f/u pt f/u with pain mgt.  back pain is better  Anxiety - Pt is not taking Lexapro due to hallucinations  Allergies: 1)  ! Codeine 2)  ! Asa 3)  ! Seldane 4)  ! Vicodin 5)  ! Lorazepam (Lorazepam) 6)  ! Amitriptyline Hcl (Amitriptyline Hcl) 7)  ! * Lexapro 8)  ! * Latex 9)  Tylox (Oxycodone-Acetaminophen)  Past History:  Past Medical History: Allergic rhinitis Anemia-NOS Anxiety     Depression   History of Diabetes mellitus, type II (diet controlled)  Hyperlipidemia Hypertension Osteoarthritis Cerebrovascular accident, hx of  History of Right Pulmonary Nodule (stable by CT) Diverticulosis Fibromyalgia Irritable Bowel Syndrome     GERD Atypical C/P - negative myoview 09/2008 Leukopenia, chronic  Hx of chronic sinusitis  Past Surgical History: Appendectomy  Cholecystectomy   Hysterectomy  Total knee replacement  -  right            Family History: Mother deceased age 40 - pancreatic cancer; rheumatism Father deceased age 19 - CAD Brother-living age 17; prostate cancer; allergies Sister-living age 53; breast cancer; asthma  Family History of Ovarian Cancer: Family History of Diabetes:      Family History of Colon Cancer:Maternal Aunt        Social History: Widowed  4  children   Alcohol use-no    Tobacco use-no Retired from tobacco company           Physical Exam  General:  alert, well-developed, and well-nourished.   Lungs:  normal respiratory effort and normal breath sounds.   Heart:  normal rate, regular rhythm, and no gallop.   Psych:  normally interactive and good eye contact.     Impression & Recommendations:  Problem # 1:  ANXIETY (ICD-300.00) Pt could not tolerate lexapro.  she reports hallucinations with lexapro.   trial of sertraline  The following medications were removed from the medication list:    Lexapro 10 Mg Tabs (Escitalopram oxalate) .Marland Kitchen... Take 1 tablet by mouth once a day Her updated medication list for this problem includes:    Xanax 0.5 Mg Tabs (Alprazolam) .Marland Kitchen... Take 1 tablet by mouth three times a day    Zoloft 25 Mg Tabs (Sertraline hcl) .Marland Kitchen... 1/2 by mouth once daily x 7 days, then one by mouth once daily  Problem # 2:  BACK PAIN, LUMBAR (ICD-724.2) Assessment: Improved Pt followed by pain mgt  The following medications were removed from the medication list:    Dilaudid 2 Mg Tabs (Hydromorphone hcl) ..... One by mouth  three times a day prn  Complete Medication List: 1)  Zegerid 40-1100 Mg Caps (Omeprazole-sodium bicarbonate) .... Take 1 capsule by mouth once a day 2)  Calcium 500/d 500-200 Mg-unit Tabs (Calcium carbonate-vitamin d) .... By mouth three times a day 3)  Co-enzyme Q-10 30 Mg Caps (Coenzyme q10) .... Three times a day by mouth 4)  Plavix 75 Mg Tabs (Clopidogrel bisulfate) .... By mouth once daily 5)  Gabapentin 300 Mg Caps (Gabapentin) .... One by mouth three times a day 6)  Voltaren 1 % Gel (Diclofenac sodium) .... Apply three times a day prn 7)  Vitamin B1  .... Take 1 tablet three times a day as needed 8)  Magnesium  .... Take 1 tablet by mouth once a day 9)  Xanax 0.5 Mg Tabs (Alprazolam) .... Take 1 tablet by mouth three times a day 10)  Magic Mouth Wash  .... 1 teaspoon every 4 hours as needed  mouth pain. 11)  Nasonex 50 Mcg/act Susp (Mometasone furoate) .... 2 sprays each nostril qd 12)  Furosemide 20 Mg Tabs (Furosemide) .... One by mouth once daily 13)  Zoloft 25 Mg Tabs (Sertraline hcl) .... 1/2 by mouth once daily x 7 days, then one by mouth once daily  Patient Instructions: 1)  Please schedule a follow-up appointment in 2 months. Prescriptions: ZOLOFT 25 MG TABS (SERTRALINE HCL) 1/2 by mouth once daily x 7 days, then one by mouth once daily Brand medically necessary #30 x 2   Entered and Authorized by:   D. Thomos Lemons DO   Signed by:   D. Thomos Lemons DO on 05/09/2010   Method used:   Print then Give to Patient   RxID:   [id number] ZEGERID 40-1100 MG CAPS (OMEPRAZOLE-SODIUM BICARBONATE) Take 1 capsule by mouth once a day Brand medically necessary #90 x 3   Entered and Authorized by:   D. Thomos Lemons DO   Signed by:   D. Thomos Lemons DO on 05/09/2010   Method used:   Print then Give to Patient   RxID:   [id number] PLAVIX 75 MG  TABS (CLOPIDOGREL BISULFATE) by mouth once daily  #90 x 3   Entered and Authorized by:   D. Thomos Lemons DO   Signed by:   D. Thomos Lemons DO on 05/09/2010   Method used:   Print then Give to Patient   RxID:   [id number] XANAX 0.5 MG TABS (ALPRAZOLAM) Take 1 tablet by mouth three times a day  #90 x 0   Entered and Authorized by:   D. Thomos Lemons DO   Signed by:   D. Thomos Lemons DO on 05/09/2010   Method used:   Print then Give to Patient   RxID:   [id number]   Current Allergies (reviewed today): ! CODEINE ! ASA ! SELDANE ! VICODIN ! LORAZEPAM (LORAZEPAM) ! AMITRIPTYLINE HCL (AMITRIPTYLINE HCL) ! * LEXAPRO ! * LATEX TYLOX (OXYCODONE-ACETAMINOPHEN)

## 2010-12-28 NOTE — Assessment & Plan Note (Signed)
Summary: Right sciatic pain/DK   Vital Signs:  Patient profile:   77 year old female Height:      66 inches Weight:      153.50 pounds BMI:     24.87 O2 Sat:      99 % on Room air Temp:     98.8 degrees F oral Pulse rate:   67 / minute Pulse rhythm:   regular Resp:     18 per minute BP sitting:   144 / 80  (right arm) Cuff size:   regular  Vitals Entered By: Glendell Docker CMA (Apr 07, 2010 1:51 PM)  O2 Flow:  Room air CC: Rm 2- Right Leg throbbing pain and tingling Comments rx for lasix instead of Amlodipine, patient states she feels out of it with the amlodipine   Primary Care Provider:  Dondra Spry, DO  CC:  Rm 2- Right Leg throbbing pain and tingling.  History of Present Illness: 77 y/o AA female right buttock pain that radiates down to foot not dragging right foot painful to wear sock or compression temporary pain relief percocet took 3 vicodin - mild relief muscle relaxer is not helping she has upcoming appt with pain mgt  Allergies: 1)  ! Codeine 2)  ! Asa 3)  ! Seldane 4)  ! Vicodin 5)  ! Lorazepam (Lorazepam) 6)  ! Amitriptyline Hcl (Amitriptyline Hcl) 7)  ! * Latex 8)  Tylox (Oxycodone-Acetaminophen)  Past History:  Past Medical History: Allergic rhinitis Anemia-NOS Anxiety    Depression   History of Diabetes mellitus, type II (diet controlled)  Hyperlipidemia Hypertension Osteoarthritis Cerebrovascular accident, hx of  History of Right Pulmonary Nodule (stable by CT) Diverticulosis Fibromyalgia Irritable Bowel Syndrome     GERD Atypical C/P - negative myoview 09/2008 Leukopenia, chronic  Hx of chronic sinusitis  Past Surgical History: Appendectomy  Cholecystectomy   Hysterectomy  Total knee replacement  -  right           Family History: Mother deceased age 76 - pancreatic cancer; rheumatism Father deceased age 28 - CAD Brother-living age 42; prostate cancer; allergies Sister-living age 24; breast cancer; asthma  Family  History of Ovarian Cancer: Family History of Diabetes:      Family History of Colon Cancer:Maternal Aunt       Social History: Widowed  4 children   Alcohol use-no    Tobacco use-no Retired from tobacco company          Physical Exam  General:  alert, well-developed, and well-nourished.   Lungs:  normal respiratory effort and normal breath sounds.   Heart:  normal rate, regular rhythm, and no gallop.   Msk:  right sciatic tenderness Neurologic:  cranial nerves II-XII intact.  ambulates with cane   Impression & Recommendations:  Problem # 1:  SCIATICA, RIGHT (ICD-724.3) persistent pain despite trial of percocet and vicodin.  avoid excessive acetaminophen.   use dilaudid as directed.  f/u with pain mgt  The following medications were removed from the medication list:    Percocet 5-325 Mg Tabs (Oxycodone-acetaminophen) ..... One by mouth once daily prn    Cyclobenzaprine Hcl 10 Mg Tabs (Cyclobenzaprine hcl) ..... One half tab by mouth once daily as needed  (do not take with alprazolam)    Vicodin 5-500 Mg Tabs (Hydrocodone-acetaminophen) ..... One tablet by mouth every 6 hours as needed for severe pain. Her updated medication list for this problem includes:    Dilaudid 2 Mg Tabs (Hydromorphone hcl) ..... One  by mouth three times a day prn  Complete Medication List: 1)  Zegerid 40-1100 Mg Caps (Omeprazole-sodium bicarbonate) .... Take 1 capsule by mouth once a day 2)  Calcium 500/d 500-200 Mg-unit Tabs (Calcium carbonate-vitamin d) .... By mouth three times a day 3)  Co-enzyme Q-10 30 Mg Caps (Coenzyme q10) .... Three times a day by mouth 4)  Plavix 75 Mg Tabs (Clopidogrel bisulfate) .... By mouth once daily 5)  Gabapentin 300 Mg Caps (Gabapentin) .... One by mouth three times a day 6)  Voltaren 1 % Gel (Diclofenac sodium) .... Apply three times a day prn 7)  Vitamin B1  .... Take 1 tablet three times a day as needed 8)  Magnesium  .... Take 1 tablet by mouth once a day 9)   Xanax 0.5 Mg Tabs (Alprazolam) .... Take 1 tablet by mouth three times a day 10)  Lexapro 10 Mg Tabs (Escitalopram oxalate) .... Take 1 tablet by mouth once a day 11)  Magic Mouth Wash  .... 1 teaspoon every 4 hours as needed mouth pain. 12)  Nasonex 50 Mcg/act Susp (Mometasone furoate) .... 2 sprays each nostril qd 13)  Dilaudid 2 Mg Tabs (Hydromorphone hcl) .... One by mouth three times a day prn 14)  Furosemide 20 Mg Tabs (Furosemide) .... One by mouth once daily  Patient Instructions: 1)  Call our office if your symptoms do not  improve or gets worse. 2)  Please schedule a follow-up appointment in 1 month. 3)  BMP prior to visit, ICD-9: 401.9 4)  HgA1c:  790.29 5)  Please return for lab work one (1) week before your next appointment.  Prescriptions: FUROSEMIDE 20 MG TABS (FUROSEMIDE) one by mouth once daily  #30 x 2   Entered and Authorized by:   D. Thomos Lemons DO   Signed by:   D. Thomos Lemons DO on 04/07/2010   Method used:   [facility] [address] [phone]* (retail)       [address]       [place], [place]  [address]       Ph: [phone] or [phone]       Fax: [phone]   RxID:   [id number] DILAUDID 2 MG TABS (HYDROMORPHONE HCL) one by mouth two times a day as needed right leg pain  #30 x 0   Entered and Authorized by:   D. Thomos Lemons DO   Signed by:   D. Thomos Lemons DO on 04/07/2010   Method used:   Print then Give to Patient   RxID:   [id number]   Current Allergies (reviewed today): ! CODEINE ! ASA ! SELDANE ! VICODIN ! LORAZEPAM (LORAZEPAM) ! AMITRIPTYLINE HCL (AMITRIPTYLINE HCL) ! * LATEX TYLOX (OXYCODONE-ACETAMINOPHEN)

## 2010-12-28 NOTE — Progress Notes (Signed)
Summary: Refill  Alprazolam   Phone Note Refill Request Message from:  Fax from Pharmacy on November 17, 2010 12:03 PM  Refills Requested: Medication #1:  XANAX 0.5 MG TABS Take 1 tablet by mouth three times a day   Dosage confirmed as above?Dosage Confirmed   Brand Name Necessary? No   Supply Requested: 3 months   Last Refilled: 05/12/2010 Next appt   February 28,2012   Method Requested: Electronic Next Appointment Scheduled: February 28,2012 Initial call taken by: Darral Dash,  November 17, 2010 12:07 PM  Follow-up for Phone Call        ok to refill x 3 Follow-up by: D. Thomos Lemons DO,  November 17, 2010 2:40 PM  Additional Follow-up for Phone Call Additional follow up Details #1::        Refills left on pharmacy voicemail ([facility] [place] Nicki Guadalajara Fergerson CMA Duncan Dull)  November 17, 2010 3:35 PM

## 2010-12-28 NOTE — Assessment & Plan Note (Signed)
Summary: PAIN RIGHT LEG/MHF   Vital Signs:  Patient profile:   77 year old female Height:      66 inches Temp:     98.2 degrees F oral Pulse rate:   84 / minute Pulse rhythm:   regular Resp:     16 per minute BP sitting:   138 / 80  (right arm) Cuff size:   regular  Vitals Entered By: Mervin Kung CMA (March 21, 2010 2:43 PM)  Primary Care Provider:  Thomos Lemons, DO   History of Present Illness: Angela Wang is a 77 year old female who presents with 3-4 week history of pain radiating down the back of her right leg.  Chart review notes + hx or right sided sciatica for which she was seen by neurosurgery back in 2009.  She reports that she was offered surgery at that time.  Notes pain is worse this time than last.    Allergies: 1)  ! Codeine 2)  ! Asa 3)  ! Seldane 4)  ! Vicodin 5)  ! Lorazepam (Lorazepam) 6)  ! Amitriptyline Hcl (Amitriptyline Hcl) 7)  ! * Latex 8)  Tylox (Oxycodone-Acetaminophen)  Physical Exam  General:  Uncomfortable appearing AA female in NAD Lungs:  Normal respiratory effort, chest expands symmetrically. Lungs are clear to auscultation, no crackles or wheezes. Heart:  Normal rate and regular rhythm. S1 and S2 normal without gallop, murmur, click, rub or other extra sounds. Neurologic:  4-5/5 strength bilateral lower extremities.  Did not test patellar reflexes due to bilateral knee braces. Unsteady gait due to pain.   Impression & Recommendations:  Problem # 1:  SCIATICA, RIGHT (ICD-724.3) Assessment Deteriorated Patient has history of same.  She saw Dr. Channing Mutters in the past and declined surgical intervention.  She did find some improvment with physical therapy- however feels that her pain is too severe at this point to participate in PT.  Plan as needed vicodin for now, f/u in 2 weeks.  Consider referral to PT at that time.   Her updated medication list for this problem includes:    Percocet 5-325 Mg Tabs (Oxycodone-acetaminophen) ..... One by mouth once  daily prn    Cyclobenzaprine Hcl 10 Mg Tabs (Cyclobenzaprine hcl) ..... One half tab by mouth once daily as needed  (do not take with alprazolam)    Vicodin 5-500 Mg Tabs (Hydrocodone-acetaminophen) ..... One tablet by mouth every 6 hours as needed for severe pain.  Complete Medication List: 1)  Zegerid 40-1100 Mg Caps (Omeprazole-sodium bicarbonate) .... Take 1 capsule by mouth once a day 2)  Calcium 500/d 500-200 Mg-unit Tabs (Calcium carbonate-vitamin d) .... By mouth three times a day 3)  Co-enzyme Q-10 30 Mg Caps (Coenzyme q10) .... Three times a day by mouth 4)  Plavix 75 Mg Tabs (Clopidogrel bisulfate) .... By mouth once daily 5)  Gabapentin 300 Mg Caps (Gabapentin) .... One by mouth three times a day 6)  Voltaren 1 % Gel (Diclofenac sodium) .... Apply three times a day prn 7)  Vitamin B1  .... Take 1 tablet three times a day as needed 8)  Magnesium  .... Take 1 tablet by mouth once a day 9)  Amlodipine Besylate 2.5 Mg Tabs (Amlodipine besylate) .... One by mouth qd 10)  Xanax 0.5 Mg Tabs (Alprazolam) .... Take 1 tablet by mouth three times a day 11)  Lexapro 10 Mg Tabs (Escitalopram oxalate) .... Take 1 tablet by mouth once a day 12)  Magic Mouth Wash  .Marland KitchenMarland KitchenMarland Kitchen  1 teaspoon every 4 hours as needed mouth pain. 13)  Percocet 5-325 Mg Tabs (Oxycodone-acetaminophen) .... One by mouth once daily prn 14)  Nasonex 50 Mcg/act Susp (Mometasone furoate) .... 2 sprays each nostril qd 15)  Cyclobenzaprine Hcl 10 Mg Tabs (Cyclobenzaprine hcl) .... One half tab by mouth once daily as needed  (do not take with alprazolam) 16)  Vicodin 5-500 Mg Tabs (Hydrocodone-acetaminophen) .... One tablet by mouth every 6 hours as needed for severe pain.  Patient Instructions: 1)  Please follow up in 2 weeks, call if symptoms worsen. Prescriptions: VICODIN 5-500 MG TABS (HYDROCODONE-ACETAMINOPHEN) one tablet by mouth every 6 hours as needed for severe pain.  #30 x 0   Entered and Authorized by:   Lemont Fillers  FNP   Signed by:   Lemont Fillers FNP on 03/21/2010   Method used:   Print then Give to Patient   RxID:   [id number]

## 2010-12-28 NOTE — Progress Notes (Signed)
Summary: Zegerid Rx  Phone Note Call from Patient Call back at Home Phone [phone]   Caller: Patient Call For: yoo  Summary of Call: she called the pharmacy and was told it is too early to fill her Zegerid.  She is coming tomorrow for lab work and wants to [employer] up a written rx for the Zegerid so she can take it to the pharmacy when it is time to refill  Initial call taken by: Roselle Locus,  October 24, 2010 11:31 AM  Follow-up for Phone Call        call returned to patient regarding Zegerid. She is requesting a written rx to take to another pharmacy, because her current pharmacy was given her the run around. They would not allow her to refill her rx.  [facility], the pharmacist states patient had a rx filled on 11/ 27 for 20 mg- Generic. The pharmacist was infromed a new rx for 40-1100 was sent in on the 28th. The pharmacist stated patients insurance would not fill brand that she would need to have generic. She was informed the patient will pay out of pocket for the Brand. Pharmacist stated rx could be filled, but may cost the patient close to $300 to have filled. Follow-up by: Glendell Docker CMA,  October 24, 2010 11:51 AM  Additional Follow-up for Phone Call Additional follow up Details #1::        call was returned to patient, she was informed that she will need to let the pharmacy know that she is paying out of pocket for the rx and the cost to her would be close to $300. Patient states she is aware of what the medication will cost her. She was advised the rx is at the pharmacy and she could go ahead and have the rx filled.  Patient verbalized understanding and agrees as instructed.  Additional Follow-up by: Glendell Docker CMA,  October 24, 2010 11:58 AM

## 2010-12-28 NOTE — Miscellaneous (Signed)
Summary: eye exam  Clinical Lists Changes  Observations: Added new observation of DMEYEEXAMNXT: 02/2011 (02/21/2010 16:46) Added new observation of DMEYEEXMRES: diabetic retinopathy (02/16/2010 16:47) Added new observation of EYE EXAM BY: Mateo Flow, MD (02/16/2010 16:47) Added new observation of DIAB EYE EX: diabetic retinopathy (02/16/2010 16:47)        Diabetes Management Exam:    Eye Exam:       Eye Exam done elsewhere          Date: 02/16/2010          Results: diabetic retinopathy          Done by: Mateo Flow, MD

## 2010-12-28 NOTE — Letter (Signed)
Summary: [facility]  [facility]   Imported By: Lanelle Bal 07/07/2010 12:23:22  _____________________________________________________________________  External Attachment:    Type:   Image     Comment:   External Document

## 2010-12-28 NOTE — Progress Notes (Signed)
Summary: Pharmacy request generic  Phone Note From Pharmacy Call back at [phone]   Summary of Call: Pam from [facility] pharmacy called & request Rx be changed to a generic, pt is waiting Initial call taken by: Lannette Donath,  June 13, 2010 12:29 PM  Follow-up for Phone Call        call returned to pharmacist. He, Annette Stable was informed, generic substitute okay per Dr Artist Pais approval Follow-up by: Glendell Docker CMA,  June 13, 2010 12:36 PM

## 2010-12-28 NOTE — Assessment & Plan Note (Signed)
Summary: STRONG ODOR TO URINE-DIFF SLEEPING-WANTS CHOLESTROL CK/DT   Vital Signs:  Patient profile:   77 year old female Height:      66 inches Weight:      154.75 pounds BMI:     25.07 Temp:     98.5 degrees F rectal Pulse rate:   80 / minute Pulse rhythm:   regular Resp:     20 per minute BP sitting:   130 / 80  (left arm) Cuff size:   regular  Vitals Entered By: Glendell Docker CMA (March 06, 2010 2:52 PM) CC: Rm 3-Urinary Odor & Sinus Pain   Primary Care Provider:  Thomos Lemons, DO  CC:  Rm 3-Urinary Odor & Sinus Pain.  History of Present Illness: 77 y/o AA female c/o urinary odor, Saturday she had sudden onset of lower back and abdominal Pain, sinus and ear pain, cold all of the time, states she is having leg pain  and thinks that it is related to her cholesterol medication.  ( she is not taking statin)   still having troubel sleeping despite alprazolam use  Allergies: 1)  ! Codeine 2)  ! Asa 3)  ! Seldane 4)  ! Vicodin 5)  ! Lorazepam (Lorazepam) 6)  ! Amitriptyline Hcl (Amitriptyline Hcl) 7)  ! * Latex 8)  Tylox (Oxycodone-Acetaminophen)  Past History:  Past Medical History: Allergic rhinitis Anemia-NOS Anxiety    Depression  History of Diabetes mellitus, type II (diet controlled)  Hyperlipidemia Hypertension Osteoarthritis Cerebrovascular accident, hx of  History of Right Pulmonary Nodule (stable by CT) Diverticulosis Fibromyalgia Irritable Bowel Syndrome     GERD Atypical C/P - negative myoview 09/2008 Leukopenia, chronic  Hx of chronic sinusitis  Past Surgical History: Appendectomy  Cholecystectomy   Hysterectomy  Total knee replacement  -  right         Family History: Mother deceased age 47 - pancreatic cancer; rheumatism Father deceased age 22 - CAD Brother-living age 15; prostate cancer; allergies Sister-living age 65; breast cancer; asthma  Family History of Ovarian Cancer: Family History of Diabetes:      Family History of Colon  Cancer:Maternal Aunt      Social History: Widowed  4 children   Alcohol use-no    Tobacco use-no Retired from tobacco company         Physical Exam  General:  alert, well-developed, and well-nourished.   Lungs:  Clear throughout to auscultation. Heart:  Regular rate and rhythm; no murmurs, rubs or bruits. Extremities:  trace left pedal edema and trace right pedal edema.   Neurologic:  cranial nerves II-XII intact and gait normal.     Impression & Recommendations:  Problem # 1:  OTHER NONSPECIFIC FINDING EXAMINATION OF URINE (ICD-791.9) Pt c/o strong odor.  u/a is negative.  send for urine culture pt advised to increase fluid intake and drink cranberry juice  Problem # 2:  ANXIETY (ICD-300.00) I advised against higher dose of alprazolam.   refer to psych for further eval and tx Her updated medication list for this problem includes:    Xanax 0.5 Mg Tabs (Alprazolam) .Marland Kitchen... Take 1 tablet by mouth three times a day    Lexapro 10 Mg Tabs (Escitalopram oxalate) .Marland Kitchen... Take 1 tablet by mouth once a day  Orders: Psychiatric Referral (Psych)  Problem # 3:  BACK PAIN, LUMBAR (ICD-724.2)  pt with recent exacerbation after exertion.   tx with oxycodone.  tramadol not helping  she declines referral.  she would not consider back  surgery  The following medications were removed from the medication list:    Tramadol Hcl 50 Mg Tabs (Tramadol hcl) .Marland Kitchen... Take 1 tablet by mouth every day as needed. must keep appt for colonoscopy for further refills! Her updated medication list for this problem includes:    Percocet 5-325 Mg Tabs (Oxycodone-acetaminophen) ..... One by mouth once daily prn    Cyclobenzaprine Hcl 10 Mg Tabs (Cyclobenzaprine hcl) ..... One half tab by mouth once daily as needed  (do not take with alprazolam)  Orders: T-Culture, Urine ([id number]) Specimen Handling (52841)  Complete Medication List: 1)  Zegerid 40-1100 Mg Caps (Omeprazole-sodium bicarbonate) .... Take 1  capsule by mouth once a day 2)  Calcium 500/d 500-200 Mg-unit Tabs (Calcium carbonate-vitamin d) .... By mouth three times a day 3)  Co-enzyme Q-10 30 Mg Caps (Coenzyme q10) .... Three times a day by mouth 4)  Plavix 75 Mg Tabs (Clopidogrel bisulfate) .... By mouth once daily 5)  Gabapentin 300 Mg Caps (Gabapentin) .... One by mouth three times a day 6)  Voltaren 1 % Gel (Diclofenac sodium) .... Apply three times a day prn 7)  Vitamin B1  .... Take 1 tablet three times a day as needed 8)  Magnesium  .... Take 1 tablet by mouth once a day 9)  Amlodipine Besylate 2.5 Mg Tabs (Amlodipine besylate) .... One by mouth qd 10)  Xanax 0.5 Mg Tabs (Alprazolam) .... Take 1 tablet by mouth three times a day 11)  Lexapro 10 Mg Tabs (Escitalopram oxalate) .... Take 1 tablet by mouth once a day 12)  Magic Mouth Wash  .... 1 teaspoon every 4 hours as needed mouth pain. 13)  Percocet 5-325 Mg Tabs (Oxycodone-acetaminophen) .... One by mouth once daily prn 14)  Nasonex 50 Mcg/act Susp (Mometasone furoate) .... 2 sprays each nostril qd 15)  Cyclobenzaprine Hcl 10 Mg Tabs (Cyclobenzaprine hcl) .... One half tab by mouth once daily as needed  (do not take with alprazolam)  Other Orders: UA Dipstick w/o Micro (manual) (32440)  Patient Instructions: 1)  Please schedule a follow-up appointment in 1 month. Prescriptions: PERCOCET 5-325 MG TABS (OXYCODONE-ACETAMINOPHEN) one by mouth once daily prn Brand medically necessary #30 x 0   Entered and Authorized by:   D. Thomos Lemons DO   Signed by:   D. Thomos Lemons DO on 03/06/2010   Method used:   Print then Give to Patient   RxID:   [id number] NASONEX 50 MCG/ACT SUSP (MOMETASONE FUROATE) 2 sprays each nostril qd  #1 x 3   Entered and Authorized by:   D. Thomos Lemons DO   Signed by:   D. Thomos Lemons DO on 03/06/2010   Method used:   [facility]        [facility] [facility] [address] [phone][facility] (retail)       [address]       [place],  [place]  [address]       Ph: [phone] or [phone]       Fax: [phone]   RxID:   [id number] OXYCODONE-ACETAMINOPHEN 5-325 MG TABS (OXYCODONE-ACETAMINOPHEN) one by mouth once daily as needed for low back pain  #30 x 0   Entered and Authorized by:   D. Thomos Lemons DO   Signed by:   D. Thomos Lemons DO on 03/06/2010   Method used:   Print then Give to Patient   RxID:   [id number]   Current Allergies (reviewed today): !  CODEINE ! ASA ! SELDANE ! VICODIN ! LORAZEPAM (LORAZEPAM) ! AMITRIPTYLINE HCL (AMITRIPTYLINE HCL) ! * LATEX TYLOX (OXYCODONE-ACETAMINOPHEN)     Laboratory Results   Urine Tests    Routine Urinalysis   Color: yellow Appearance: Clear Glucose: negative   (Normal Range: Negative) Bilirubin: negative   (Normal Range: Negative) Ketone: negative   (Normal Range: Negative) Spec. Gravity: <1.005   (Normal Range: 1.003-1.035) Blood: negative   (Normal Range: Negative) pH: 5.0   (Normal Range: 5.0-8.0) Protein: negative   (Normal Range: Negative) Urobilinogen: 0.2   (Normal Range: 0-1) Nitrite: negative   (Normal Range: Negative) Leukocyte Esterace: negative   (Normal Range: Negative)

## 2010-12-28 NOTE — Miscellaneous (Signed)
Summary: Pt. states cannot take mirilax/wants moviprep  Clinical Lists Changes  Medications: Added new medication of MOVIPREP 100 GM  SOLR (PEG-KCL-NACL-NASULF-NA ASC-C) As directed. - Signed Rx of MOVIPREP 100 GM  SOLR (PEG-KCL-NACL-NASULF-NA ASC-C) As directed.;  #1 x 0;  Signed;  Entered by: Clide Cliff RN;  Authorized by: Hart Carwin MD;  Method used: Electronically to [facility] [address] [place], [place]  [address], Ph: [phone], Fax: [phone]   PLEASE GIVE PATIENT THE MOVIPREP AND NOT THE MIRILAX. Prescriptions: MOVIPREP 100 GM  SOLR (PEG-KCL-NACL-NASULF-NA ASC-C) As directed.  #1 x 0   Entered by:   Clide Cliff RN   Authorized by:   Hart Carwin MD   Signed by:   Clide Cliff RN on 01/09/2010   Method used:   Electronically to        [facility]* (retail)       [address]       [place], [place]  [address]       Ph: [phone]       Fax: [phone]   RxID:   [id number]

## 2010-12-28 NOTE — Progress Notes (Signed)
Summary: Flexiril Rx  Phone Note Call from Patient Call back at Home Phone [phone]   Caller: Patient Call For: D. Thomos Lemons DO Reason for Call: Refill Medication Summary of Call: patient called and left voice message requesting a refill on Flexiril 10 mg to [facility], she states she is still having pain in her back and believes this medication will help with her Initial call taken by: Glendell Docker CMA,  March 08, 2010 10:37 AM  Follow-up for Phone Call        ok to refill x 1  Additional Follow-up for Phone Call Additional follow up Details #1::        call placed to patient,advised rx sent , she request rx sent to [facility], it is closer to her home. She has been advised rx would be cancelled and sent to [facility].   [facility], [facility] has been cancelled at that location and submitted to [facility] Additional Follow-up by: Glendell Docker CMA,  March 08, 2010 1:54 PM    New/Updated Medications: CYCLOBENZAPRINE HCL 10 MG TABS (CYCLOBENZAPRINE HCL) one half tab by mouth once daily as needed  (do not take with alprazolam) Prescriptions: CYCLOBENZAPRINE HCL 10 MG TABS (CYCLOBENZAPRINE HCL) one half tab by mouth once daily as needed  (do not take with alprazolam)  #30 x 0   Entered by:   Glendell Docker CMA   Authorized by:   D. Thomos Lemons DO   Signed by:   Glendell Docker CMA on 03/08/2010   Method used:   Electronically to        [facility]* (retail)       [address]       [place], [place]  [address]       Ph: [phone]       Fax: [phone]   RxID:   [id number] CYCLOBENZAPRINE HCL 10 MG TABS (CYCLOBENZAPRINE HCL) one half tab by mouth once daily as needed  (do not take with alprazolam)  #30 x 0   Entered and Authorized by:   D. Thomos Lemons DO   Signed by:   D. Thomos Lemons DO on 03/08/2010   Method used:   [facility]        [facility] [address] [phone][facility] (retail)       [address]       [place], [place]  [address]       Ph: [phone] or [phone]       Fax: [phone]   RxID:   [id number]

## 2010-12-28 NOTE — Letter (Signed)
Summary: [facility]  [facility]   Imported By: Lanelle Bal 04/03/2010 12:09:20  _____________________________________________________________________  External Attachment:    Type:   Image     Comment:   External Document

## 2010-12-28 NOTE — Progress Notes (Signed)
Summary: Tramadol Refill  Phone Note Call from Patient Call back at Home Phone [phone]   Caller: Patient Call For: D. Thomos Lemons DO Summary of Call: patient called and left voice message stating  the Flexiril is causing her body to jerk and she is still having pain. She would like to know if she could have refills on Tramadol 50mg   to [facility]. Her message states she is still unable to drive and is having her  daughter transport her and run errands due to the pain Initial call taken by: Glendell Docker CMA,  March 14, 2010 11:38 AM  Follow-up for Phone Call        she can not use tramadol long term.   I suggest referral to pain mgt Follow-up by: D. Thomos Lemons DO,  March 14, 2010 4:48 PM  Additional Follow-up for Phone Call Additional follow up Details #1::        patient advised per Dr Artist Pais instructions, she states that her knee has started hurting in the process and she has scheduled an office visit with her orthopaedist for Monday Additional Follow-up by: Glendell Docker CMA,  March 14, 2010 4:55 PM

## 2010-12-28 NOTE — Letter (Signed)
Summary: [facility]   Imported By: Lanelle Bal 02/28/2010 12:23:15  _____________________________________________________________________  External Attachment:    Type:   Image     Comment:   External Document

## 2010-12-28 NOTE — Procedures (Signed)
Summary: Colonoscopy  Patient: [name] Note: All result statuses are Final unless otherwise noted.  Tests: (1) Colonoscopy (COL)   COL Colonoscopy           DONE     [facility]     [address]     [place], [place]  [address]           COLONOSCOPY PROCEDURE REPORT           PATIENT:  Angela Wang, Angela Wang  MR#:  [id number]     BIRTHDATE:  June 03, 1931, 78 yrs. old  GENDER:  female           ENDOSCOPIST:  Hedwig Morton. Juanda Chance, MD     Referred by:  Thomos Lemons, DO           PROCEDURE DATE:  01/23/2010     PROCEDURE:  Colonoscopy 32440     ASA CLASS:  Class III     INDICATIONS:  hematochezia pt on Plavix     colonoscopy in 2006     aunt with colon cancer           MEDICATIONS:   Versed 9 mg, Fentanyl 75 mcg           DESCRIPTION OF PROCEDURE:   After the risks benefits and     alternatives of the procedure were thoroughly explained, informed     consent was obtained.  Digital rectal exam was performed and     revealed no rectal masses.   The LB [id number] endoscope     was introduced through the anus and advanced to the cecum, which     was identified by both the appendix and ileocecal valve, without     limitations.  The quality of the prep was good, using MiraLax.     The instrument was then slowly withdrawn as the colon was fully     examined.     <<PROCEDUREIMAGES>>           FINDINGS:  Mild diverticulosis was found in the ascending colon     (see image5, image6, and image7). scattered large, shallow     diverticuli in the right colon  This was otherwise a normal     examination of the colon (see image9, image8, image4, image3, and     image2).  Melanosis coli was found (see image1).   Retroflexed     views in the rectum revealed no abnormalities.    The scope was     then withdrawn from the patient and the procedure completed.           COMPLICATIONS:  None           ENDOSCOPIC IMPRESSION:     1) Mild diverticulosis in the ascending colon     2) Otherwise  normal examination     3) Melanosis     suspect rectal source of bleeding, no definite hemorrhoids     RECOMMENDATIONS:     high fiber diet, [place] prn, Benefiber daily     continue Plavix           REPEAT EXAM:  In 10 year(s) for.  may not need a recall colon due     to age           ______________________________     Hedwig Morton. Juanda Chance, MD           CC:           n.  eSIGNED:   Hedwig Morton. Marcie Shearon at 01/23/2010 12:04 PM           Cleda Daub, [id number]  Note: An exclamation mark (!) indicates a result that was not dispersed into the flowsheet. Document Creation Date: 01/23/2010 12:04 PM _______________________________________________________________________  (1) Order result status: Final Collection or observation date-time: 01/23/2010 11:55 Requested date-time:  Receipt date-time:  Reported date-time:  Referring Physician:   Ordering Physician: Lina Sar [phone]) Specimen Source:  Source: Launa Grill Order Number: [id number] Lab site:   Appended Document: Colonoscopy    Clinical Lists Changes  Observations: Added new observation of COLONNXTDUE: 12/2019 (01/23/2010 12:39)

## 2010-12-28 NOTE — Miscellaneous (Signed)
Summary: 569.3/dn...as.  Clinical Lists Changes  Medications: Added new medication of MIRALAX   POWD (POLYETHYLENE GLYCOL 3350) As directed - Signed Added new medication of REGLAN 10 MG  TABS (METOCLOPRAMIDE HCL) As directed - Signed Added new medication of DULCOLAX 5 MG  TBEC (BISACODYL) As directed - Signed Rx of MIRALAX   POWD (POLYETHYLENE GLYCOL 3350) As directed;  #255 GMs x 0;  Signed;  Entered by: Clide Cliff RN;  Authorized by: Hart Carwin MD;  Method used: Electronically to [facility]*, [address], [place], [place]  [address], Ph: [phone], Fax: [phone] Rx of REGLAN 10 MG  TABS (METOCLOPRAMIDE HCL) As directed;  #2 x 0;  Signed;  Entered by: Clide Cliff RN;  Authorized by: Hart Carwin MD;  Method used: Electronically to [facility]*, [address], [place], [place]  [address], Ph: [phone], Fax: [phone] Rx of DULCOLAX 5 MG  TBEC (BISACODYL) As directed;  #4 x 0;  Signed;  Entered by: Clide Cliff RN;  Authorized by: Hart Carwin MD;  Method used: Electronically to [facility]*, [address] [place], [place]  [address], Ph: [phone], Fax: [phone] Allergies: Changed allergy or adverse reaction from CODEINE to CODEINE Changed allergy or adverse reaction from ASA to ASA Changed allergy or adverse reaction from REGLAN to [facility] Changed allergy or adverse reaction from VICODIN to VICODIN Changed allergy or adverse reaction from * LATEX to * LATEX    Prescriptions: DULCOLAX 5 MG  TBEC (BISACODYL) As directed  #4 x 0   Entered by:   Clide Cliff RN   Authorized by:   Hart Carwin MD   Signed by:   Clide Cliff RN on 01/09/2010   Method used:   Electronically to        [facility]* (retail)       [address]       [place], [place]  [address]       Ph: [phone]       Fax: [phone]   RxID:   [id number] REGLAN 10 MG  TABS (METOCLOPRAMIDE HCL) As directed  #2 x 0  Entered by:   Clide Cliff RN   Authorized by:   Hart Carwin MD   Signed by:   Clide Cliff RN on 01/09/2010   Method used:   Electronically to        [facility]* (retail)       [address]       [place], [place]  [address]       Ph: [phone]       Fax: [phone]   RxID:   [id number] MIRALAX   POWD (POLYETHYLENE GLYCOL 3350) As directed  #255 GMs x 0   Entered by:   Clide Cliff RN   Authorized by:   Hart Carwin MD   Signed by:   Clide Cliff RN on 01/09/2010   Method used:   Electronically to        [facility]* (retail)       [address]       [place], [place]  [address]       Ph: [phone]       Fax: [phone]   RxID:   [id number]

## 2010-12-28 NOTE — Miscellaneous (Signed)
Summary: mammogram  Clinical Lists Changes  Observations: Added new observation of MAMMOGRAM: normal (10/23/2010 8:28)      Preventive Care Screening  Mammogram:    Date:  10/23/2010    Results:  normal

## 2010-12-28 NOTE — Assessment & Plan Note (Signed)
Summary: 2 month fu/dt   Vital Signs:  Patient profile:   77 year old female Weight:      160 pounds BMI:     25.92 O2 Sat:      99 % on Room air Temp:     98.4 degrees F oral Pulse rate:   85 / minute Pulse rhythm:   regular Resp:     20 per minute BP sitting:   124 / 74  (right arm) Cuff size:   regular  Vitals Entered By: Glendell Docker CMA (July 11, 2010 10:44 AM)  O2 Flow:  Room air CC: 2 Month Follow up Is Patient Diabetic? No Pain Assessment Patient in pain? no      Comments c/o urinary odor, request Zostavax , c/o right hand numbing in the finger tips, requesting refill on Zegerid and Plavix Brand name only   Primary Care Provider:  Dondra Spry, DO  CC:  2 Month Follow up.  History of Present Illness: 77 y/o AA female c/o intermittent strong odor to urine no dysuria no fever or chills no back pain not related to medication use  Preventive Screening-Counseling & Management  Alcohol-Tobacco     Smoking Status: never  Allergies: 1)  ! Codeine 2)  ! Asa 3)  ! Seldane 4)  ! Vicodin 5)  ! Lorazepam (Lorazepam) 6)  ! Amitriptyline Hcl (Amitriptyline Hcl) 7)  ! * Lexapro 8)  ! * Latex 9)  Tylox (Oxycodone-Acetaminophen)  Past History:  Past Medical History: Allergic rhinitis Anemia-NOS  Anxiety     Depression   History of Diabetes mellitus, type II (diet controlled)   Hyperlipidemia Hypertension Osteoarthritis Cerebrovascular accident, hx of  History of Right Pulmonary Nodule (stable by CT) Diverticulosis Fibromyalgia Irritable Bowel Syndrome     GERD Atypical C/P - negative myoview 09/2008 Leukopenia, chronic  Hx of chronic sinusitis  Past Surgical History: Appendectomy  Cholecystectomy   Hysterectomy  Total knee replacement  -  right             Family History: Mother deceased age 36 - pancreatic cancer; rheumatism Father deceased age 36 - CAD Brother-living age 60; prostate cancer; allergies Sister-living age 78; breast  cancer; asthma  Family History of Ovarian Cancer: Family History of Diabetes:      Family History of Colon Cancer:Maternal Aunt          Physical Exam  General:  alert, well-developed, and well-nourished.   Lungs:  normal respiratory effort and normal breath sounds.   Heart:  normal rate, regular rhythm, and no gallop.   Abdomen:  soft, non-tender, and normal bowel sounds.   Extremities:  trace left pedal edema and trace right pedal edema.     Impression & Recommendations:  Problem # 1:  OTHER NONSPECIFIC FINDING EXAMINATION OF URINE (ICD-791.9) 77 y/o intermittent strong odor to urine.  urine dip is negative.  increase water intake  Orders: UA Dipstick w/o Micro (manual) (04540) T-Culture, Urine ([address]) TLB-Udip w/ Micro (81001-URINE)  Problem # 2:  INSOMNIA, CHRONIC (ICD-307.42) avoid furthe increase in sedatives.  pt to try low dose melatonin over the counter  Complete Medication List: 1)  Zegerid 40-1100 Mg Caps (Omeprazole-sodium bicarbonate) .... Take 1 capsule by mouth once a day 2)  Calcium 500/d 500-200 Mg-unit Tabs (Calcium carbonate-vitamin d) .... By mouth three times a day 3)  Co-enzyme Q-10 30 Mg Caps (Coenzyme q10) .... Three times a day by mouth 4)  Plavix 75 Mg Tabs (Clopidogrel bisulfate) .Marland KitchenMarland KitchenMarland Kitchen  By mouth once daily 5)  Gabapentin 300 Mg Caps (Gabapentin) .... One by mouth three times a day 6)  Voltaren 1 % Gel (Diclofenac sodium) .... Apply three times a day prn 7)  B-100 Tabs (Vitamins-lipotropics) .... Take 1 tablet by mouth once a day b 8)  Magnesium 500 Mg Tabs (Magnesium) .... Take 1 tablet by mouth once a day 9)  Xanax 0.5 Mg Tabs (Alprazolam) .... Take 1 tablet by mouth three times a day 10)  Nasonex 50 Mcg/act Susp (Mometasone furoate) .... 2 sprays each nostril qd 11)  Furosemide 20 Mg Tabs (Furosemide) .... One by mouth once daily 12)  Lotemax 0.5 % Susp (Loteprednol etabonate) .... One drop right eye two times a day 13)  Melatonin 1 Mg Tabs  (Melatonin) .... 1/2 tab at 4pm and 8 pm 14)  Zostavax 16109 Unt/0.76ml Solr (Zoster vaccine live) .... Administer vaccine x 1  Patient Instructions: 1)  Please schedule a follow-up appointment in 3 months. 2)  Drink 4-6 glasses of water per day 3)  Take OTC cranberry pills daily 4)  Call our office if you develop fever or back pain Prescriptions: ZOSTAVAX 60454 UNT/0.65ML SOLR (ZOSTER VACCINE LIVE) administer vaccine x 1  #1 x 0   Entered and Authorized by:   D. Thomos Lemons DO   Signed by:   D. Thomos Lemons DO on 07/11/2010   Method used:   Print then Give to Patient   RxID:   [id number] PLAVIX 75 MG  TABS (CLOPIDOGREL BISULFATE) by mouth once daily Brand medically necessary #90 x 3   Entered and Authorized by:   D. Thomos Lemons DO   Signed by:   D. Thomos Lemons DO on 07/11/2010   Method used:   Faxed to ...       [facility] (mail-order)       [address]       [place], [place]  [address]       Ph: [phone]       Fax: [phone]   RxID:   [id number] ZEGERID 40-1100 MG CAPS (OMEPRAZOLE-SODIUM BICARBONATE) Take 1 capsule by mouth once a day Brand medically necessary #90 x 3   Entered and Authorized by:   D. Thomos Lemons DO   Signed by:   D. Thomos Lemons DO on 07/11/2010   Method used:   Faxed to ...       [facility] (mail-order)       [address]       [place], [place]  [address]       Ph: [phone]       Fax: [phone]   RxID:   [id number]   Current Allergies (reviewed today): ! CODEINE ! ASA ! SELDANE ! VICODIN ! LORAZEPAM (LORAZEPAM) ! AMITRIPTYLINE HCL (AMITRIPTYLINE HCL) ! * LEXAPRO ! * LATEX TYLOX (OXYCODONE-ACETAMINOPHEN)  Laboratory Results   Urine Tests    Routine Urinalysis   Color: yellow Appearance: Clear Glucose: negative   (Normal Range: Negative) Bilirubin: negative   (Normal Range: Negative) Ketone: negative   (Normal Range: Negative) Spec. Gravity: 1.015   (Normal Range: 1.003-1.035) Blood:  negative   (Normal Range: Negative) pH: 5.0   (Normal Range: 5.0-8.0) Protein: negative   (Normal Range: Negative) Urobilinogen: 0.2   (Normal Range: 0-1) Nitrite: negative   (Normal Range: Negative) Leukocyte Esterace: negative   (Normal Range: Negative)

## 2010-12-28 NOTE — Progress Notes (Signed)
  Phone Note Other Incoming Call back at Patient walk in to lobby   Summary of Call: Patient walked into office requesting change in medication to Pantoprazole 40 mg. When I looked up medication, Dr Artist Pais ordered this medication. Called and left a message on patient's answering machine that she needs to call Dr. Artist Pais to get this changed. Initial call taken by: Jesse Fall RN,  November 01, 2010 4:10 PM

## 2010-12-28 NOTE — Progress Notes (Signed)
Summary: Xanax Refill  Phone Note Refill Request Message from:  Fax from Pharmacy on February 15, 2010 8:43 AM  Refills Requested: Medication #1:  XANAX 0.5 MG TABS Take 1 tablet by mouth three times a day   Dosage confirmed as above?Dosage Confirmed   Brand Name Necessary? No   Supply Requested: 3 months   Last Refilled: 12/13/2009 [facility] [address] [place]  [phone] FAX [phone]   Method Requested: Electronic Next Appointment Scheduled: 06-13-2010 10 AM dR Austan Nicholl  Initial call taken by: Roselle Locus,  February 15, 2010 8:44 AM  Follow-up for Phone Call        ok to refill x 3 Follow-up by: D. Thomos Lemons DO,  February 16, 2010 1:07 PM  Additional Follow-up for Phone Call Additional follow up Details #1::        Rx called to pharmacy Additional Follow-up by: Glendell Docker CMA,  February 16, 2010 2:58 PM    Prescriptions: XANAX 0.5 MG TABS (ALPRAZOLAM) Take 1 tablet by mouth three times a day  #30 x 3   Entered by:   Glendell Docker CMA   Authorized by:   D. Thomos Lemons DO   Signed by:   Glendell Docker CMA on 02/16/2010   Method used:   Telephoned to ...       [facility] [address] [phone]* (retail)       [address]       [place], [place]  [address]       Ph: [phone] or [phone]       Fax: [phone]   RxID:   [id number]

## 2010-12-28 NOTE — Assessment & Plan Note (Signed)
Summary: COLD/DLO   Vital Signs:  Patient profile:   77 year old female Weight:      155 pounds Temp:     98.5 degrees F oral Pulse rate:   76 / minute Pulse rhythm:   regular BP sitting:   144 / 80  (left arm) Cuff size:   large  Vitals Entered By: Lowella Petties CMA (January 28, 2010 12:27 PM) CC: Cold symptoms   Primary Care Provider:  Thomos Lemons, DO   History of Present Illness: 77 year old:  77 year old:  Acute R maxillary sinusitis:  Terrible headache, throat, glands are swollen. Feel really bad. no fever. Really had bad in the front of her sinuses. primary pain is at the right maxillary sinus. She also some mild pain on left maxillary sinus, and much less so on the upper frontal sinuses.   She does have some nasal congestion and some mild coughing as well. She has had some symptoms for about 3 weeks, had a cold for about this long.  ROS: no fever, some fatigue, cough, sinus symptoms as above. No rashes, no dysuria.  Gen: WDWN, NAD; alert,appropriate and cooperative throughout exam  HEENT: Normocephalic and atraumatic. Throat clear, w/o exudate, no LAD, R TM clear, L TM - good landmarks, No fluid present. rhinnorhea.  Left frontal and maxillary sinuses: Tender, mild L max Right frontal and maxillary sinuses: Tender mostly at R Max  Neck: No ant or post LAD  CV: RRR, No M/G/R  Pulm: Breathing comfortably in no resp distress. no w/c/r  Abd: S,NT,ND,+BS  Extr: no c/c/e  Psych: full affect, pleasant   Allergies: 1)  ! Codeine 2)  ! Asa 3)  ! Seldane 4)  ! Vicodin 5)  ! Lorazepam (Lorazepam) 6)  ! Amitriptyline Hcl (Amitriptyline Hcl) 7)  ! * Latex 8)  Tylox (Oxycodone-Acetaminophen)  Past History:  Past medical, surgical, family and social histories (including risk factors) reviewed, and no changes noted (except as noted below).  Past Medical History: Reviewed history from 12/13/2009 and no changes required. Allergic rhinitis Anemia-NOS Anxiety     Depression  History of Diabetes mellitus, type II (diet controlled)  Hyperlipidemia Hypertension Osteoarthritis Cerebrovascular accident, hx of  History of Right Pulmonary Nodule (stable by CT) Diverticulosis Fibromyalgia Irritable Bowel Syndrome    GERD Atypical C/P - negative myoview 09/2008 Leukopenia, chronic  Hx of chronic sinusitis  Past Surgical History: Reviewed history from 12/13/2009 and no changes required. Appendectomy  Cholecystectomy   Hysterectomy  Total knee replacement  -  right        Family History: Reviewed history from 12/13/2009 and no changes required. Mother deceased age 33 - pancreatic cancer; rheumatism Father deceased age 73 - CAD Brother-living age 44; prostate cancer; allergies Sister-living age 3; breast cancer; asthma  Family History of Ovarian Cancer: Family History of Diabetes:     Family History of Colon Cancer:Maternal Aunt     Social History: Reviewed history from 12/13/2009 and no changes required. Widowed  4 children   Alcohol use-no   Tobacco use-no Retired from tobacco company         Impression & Recommendations:  Problem # 1:  SINUSITIS - ACUTE-NOS (ICD-461.9) Assessment New Treat with Levaquin  Patient declined my recommendation of Amoxicillin or Augmentin. Patient asked for a Decadron shot - To my knowledge, following Rhinosinusitis treatment algorithm from [employer] of Otolaryngology, this is not in the routine treatment algorithm. Not given steroids.  c/w supportive care, nasal  saline, Afrin for the next 3-4 days  Her updated medication list for this problem includes:    Levaquin 500 Mg Tabs (Levofloxacin) .Marland Kitchen... 1 by mouth daily  Complete Medication List: 1)  Zegerid 40-1100 Mg Caps (Omeprazole-sodium bicarbonate) .... Take 1 capsule by mouth once a day 2)  Calcium 500/d 500-200 Mg-unit Tabs (Calcium carbonate-vitamin d) .... By mouth three times a day 3)  Co-enzyme Q-10 30 Mg Caps (Coenzyme q10) ....  Three times a day by mouth 4)  Plavix 75 Mg Tabs (Clopidogrel bisulfate) .... By mouth once daily 5)  Gabapentin 300 Mg Caps (Gabapentin) .... One by mouth three times a day 6)  Voltaren 1 % Gel (Diclofenac sodium) .... Apply three times a day prn 7)  Vitamin B1  .... Take 1 tablet three times a day as needed 8)  Magnesium  .... Take 1 tablet by mouth once a day 9)  Tramadol Hcl 50 Mg Tabs (Tramadol hcl) .... Take 1 tablet by mouth every day as needed. must keep appt for colonoscopy for further refills! 10)  Amlodipine Besylate 2.5 Mg Tabs (Amlodipine besylate) .... One by mouth qd 11)  Xanax 0.5 Mg Tabs (Alprazolam) .... Take 1 tablet by mouth three times a day 12)  Lexapro 10 Mg Tabs (Escitalopram oxalate) .... Take 1 tablet by mouth once a day 13)  Magic Mouth Wash  .... 1 teaspoon every 4 hours as needed mouth pain. 14)  Levaquin 500 Mg Tabs (Levofloxacin) .Marland Kitchen.. 1 by mouth daily Prescriptions: LEVAQUIN 500 MG TABS (LEVOFLOXACIN) 1 by mouth daily  #10 x 0   Entered and Authorized by:   Hannah Beat MD   Signed by:   Hannah Beat MD on 01/28/2010   Method used:   Print then Give to Patient   RxID:   [id number] MAGIC MOUTH WASH 1 teaspoon every 4 hours as needed mouth pain.  #240 mL x 0   Entered and Authorized by:   Hannah Beat MD   Signed by:   Hannah Beat MD on 01/28/2010   Method used:   Print then Give to Patient   RxID:   [id number]

## 2010-12-28 NOTE — Progress Notes (Signed)
Summary: [facility] -Mail Order  Phone Note Call from Patient Call back at Home Phone [phone]   Caller: Patient Summary of Call: patient called and left voice message requesting a rx for the brand name Zegerid, she states the was the previous rx was writtent the [facility] pharmacy will send her the generic and she does not want the generic. If approved she would like the rx sent to [facility] Initial call taken by: Glendell Docker CMA,  December 14, 2009 10:38 AM  Follow-up for Phone Call        Rx sent to mail order per Dr Artist Pais ok, patient informed Follow-up by: Glendell Docker CMA,  December 14, 2009 10:40 AM    Prescriptions: ZEGERID 40-1100 MG CAPS (OMEPRAZOLE-SODIUM BICARBONATE) Take 1 capsule by mouth once a day Brand medically necessary #90 x 1   Entered by:   Glendell Docker CMA   Authorized by:   D. Thomos Lemons DO   Signed by:   Glendell Docker CMA on 12/14/2009   Method used:   Faxed to ...       [facility] (mail-order)       [address]       [place], [place]  [id number]       Ph: [phone]       Fax: [phone]   RxID:   [id number]

## 2010-12-28 NOTE — Assessment & Plan Note (Signed)
Summary: 4 MONTH FU/DT   Vital Signs:  Patient profile:   77 year old female Height:      66 inches Weight:      158.50 pounds BMI:     25.68 O2 Sat:      98 % on Room air Temp:     98.2 degrees F oral Pulse rate:   86 / minute Resp:     20 per minute BP sitting:   132 / 70  (right arm) Cuff size:   regular  Vitals Entered By: Glendell Docker CMA (October 17, 2010 10:33 AM)  O2 Flow:  Room air CC: 4 Month Follow up  Is Patient Diabetic? No Pain Assessment Patient in pain? no        Primary Care Provider:  Dondra Spry, DO  CC:  4 Month Follow up .  History of Present Illness: 77 y/o female recently seen at ER for sinus symptoms- completed Z pak about 3 weeks ago pt co sinus congestion ear feels plugged up occ spits out mucus headache     Preventive Screening-Counseling & Management  Alcohol-Tobacco     Smoking Status: never  Allergies: 1)  ! Codeine 2)  ! Asa 3)  ! Seldane 4)  ! Vicodin 5)  ! Lorazepam (Lorazepam) 6)  ! Amitriptyline Hcl (Amitriptyline Hcl) 7)  ! * Lexapro 8)  ! * Latex 9)  Tylox (Oxycodone-Acetaminophen)  Past History:  Past Medical History: Allergic rhinitis Anemia-NOS   Depression   History of Diabetes mellitus, type II (diet controlled)   Hyperlipidemia Hypertension Osteoarthritis Cerebrovascular accident, hx of  History of Right Pulmonary Nodule (stable by CT) Diverticulosis Fibromyalgia Irritable Bowel Syndrome     GERD Atypical C/P - negative myoview 09/2008 Leukopenia, chronic  Hx of chronic sinusitis  Past Surgical History: Appendectomy  Cholecystectomy   Hysterectomy  Total knee replacement  -  right              Family History: Mother deceased age 33 - pancreatic cancer; rheumatism Father deceased age 45 - CAD Brother-living age 80; prostate cancer; allergies Sister-living age 56; breast cancer; asthma  Family History of Ovarian Cancer: Family History of Diabetes:      Family History of Colon  Cancer:Maternal Aunt           Social History: Widowed  4 children   Alcohol use-no    Tobacco use-no   Retired from tobacco company           Physical Exam  General:  alert, well-developed, and well-nourished.   Ears:  R ear normal and L ear normal.   Nose:  mucosal edema.   Mouth:  pharynx pink and moist.   Lungs:  normal respiratory effort and normal breath sounds.   Heart:  normal rate, regular rhythm, and no gallop.     Impression & Recommendations:  Problem # 1:  SINUSITIS - ACUTE-NOS (ICD-461.9)  Her updated medication list for this problem includes:    Nasonex 50 Mcg/act Susp (Mometasone furoate) .Marland Kitchen... 2 sprays each nostril qd    Levaquin 500 Mg Tabs (Levofloxacin) ..... One by mouth once daily  Problem # 2:  GERD (ICD-530.81)  Her updated medication list for this problem includes:    Omeprazole-sodium Bicarbonate 40-1100 Mg Caps (Omeprazole-sodium bicarbonate) ..... One by mouth once daily  Orders: T-CBC w/Diff ([id number])  Problem # 3:  BACK PAIN, LUMBAR (ICD-724.2)  Her updated medication list for this problem includes:    Tramadol Hcl 50  Mg Tabs (Tramadol hcl) .Marland Kitchen... Take 1 tablet by mouth once a day as needed back pain  Complete Medication List: 1)  Omeprazole-sodium Bicarbonate 40-1100 Mg Caps (Omeprazole-sodium bicarbonate) .... One by mouth once daily 2)  Calcium 500/d 500-200 Mg-unit Tabs (Calcium carbonate-vitamin d) .... By mouth three times a day 3)  Co-enzyme Q-10 30 Mg Caps (Coenzyme q10) .... Three times a day by mouth 4)  Plavix 75 Mg Tabs (Clopidogrel bisulfate) .... By mouth once daily 5)  Gabapentin 300 Mg Caps (Gabapentin) .... One by mouth three times a day 6)  Voltaren 1 % Gel (Diclofenac sodium) .... Apply three times a day prn 7)  B-100 Tabs (Vitamins-lipotropics) .... Take 1 tablet by mouth once a day b 8)  Magnesium 500 Mg Tabs (Magnesium) .... Take 1 tablet by mouth once a day 9)  Xanax 0.5 Mg Tabs (Alprazolam) .... Take 1 tablet  by mouth three times a day 10)  Nasonex 50 Mcg/act Susp (Mometasone furoate) .... 2 sprays each nostril qd 11)  Furosemide 20 Mg Tabs (Furosemide) .... One by mouth once daily 12)  Lotemax 0.5 % Susp (Loteprednol etabonate) .... One drop right eye two times a day 13)  Melatonin 1 Mg Tabs (Melatonin) .... 1/2 tab at 4pm and 8 pm 14)  Tramadol Hcl 50 Mg Tabs (Tramadol hcl) .... Take 1 tablet by mouth once a day as needed back pain 15)  Levaquin 500 Mg Tabs (Levofloxacin) .... One by mouth once daily  Other Orders: T-Basic Metabolic Panel [phone]) T- Hemoglobin A1C ([id number]) T- * Misc. Laboratory test [phone])  Patient Instructions: 1)  Please schedule a follow-up appointment in 3 months. Prescriptions: PLAVIX 75 MG  TABS (CLOPIDOGREL BISULFATE) by mouth once daily Brand medically necessary #90 x 1   Entered and Authorized by:   D. Thomos Lemons DO   Signed by:   D. Thomos Lemons DO on 10/17/2010   Method used:   Print then Give to Patient   RxID:   [id number] TRAMADOL HCL 50 MG TABS (TRAMADOL HCL) Take 1 tablet by mouth once a day as needed back pain  #30 x 1   Entered and Authorized by:   D. Thomos Lemons DO   Signed by:   D. Thomos Lemons DO on 10/17/2010   Method used:   [facility]ronically to        [facility] [address] [phone]* (retail)       [address]       [place], [place]  [address]       Ph: [phone] or [phone]       Fax: [phone]   RxID:   [id number] GABAPENTIN 300 MG CAPS (GABAPENTIN) one by mouth three times a day  #90 x 2   Entered and Authorized by:   D. Thomos Lemons DO   Signed by:   D. Thomos Lemons DO on 10/17/2010   Method used:   Electronically to        [facility] [address] [phone]* (retail)       [address]       [place], [place]  [address]       Ph: [phone] or [phone]       Fax: [phone]   RxID:   [id number] LEVAQUIN 500 MG TABS (LEVOFLOXACIN) one by mouth once daily  #10 x 0    Entered and Authorized by:  Dondra Spry DO   Signed by:   D. Thomos Lemons DO on 10/17/2010   Method used:   Print then Give to Patient   RxID:   [id number] OMEPRAZOLE-SODIUM BICARBONATE 40-1100 MG CAPS (OMEPRAZOLE-SODIUM BICARBONATE) one by mouth once daily  #90 x 1   Entered and Authorized by:   D. Thomos Lemons DO   Signed by:   D. Thomos Lemons DO on 10/17/2010   Method used:   Print then Give to Patient   RxID:   [id number]    Orders Added: 1)  T-Basic Metabolic Panel [[id number]] 2)  T- Hemoglobin A1C [[id number]] 3)  T-CBC w/Diff [[id number]] 4)  T- * Misc. Laboratory test [99999] 5)  Est. Patient Level III [[address]]   Immunization History:  Influenza Immunization History:    Influenza:  historical (08/15/2010)   Immunization History:  Influenza Immunization History:    Influenza:  Historical (08/15/2010)    Preventive Care Screening  Mammogram:    Date:  10/17/2010    Results:  Appt 11/28  Last Flu Shot:    Date:  08/15/2010    Results:  Historical    Current Allergies (reviewed today): ! CODEINE ! ASA ! SELDANE ! VICODIN ! LORAZEPAM (LORAZEPAM) ! AMITRIPTYLINE HCL (AMITRIPTYLINE HCL) ! * LEXAPRO ! * LATEX TYLOX (OXYCODONE-ACETAMINOPHEN)

## 2010-12-28 NOTE — Progress Notes (Signed)
Summary: refill--alprazolam  Phone Note Refill Request Message from:  Fax from Pharmacy on August 04, 2010 1:33 PM  Refills Requested: Medication #1:  XANAX 0.5 MG TABS Take 1 tablet by mouth three times a day   Dosage confirmed as above?Dosage Confirmed   Supply Requested: 1 month   Last Refilled: 07/15/2010 Last seen 07-11-10.  Next Appointment Scheduled: 10-16-10 Dr Artist Pais Initial call taken by: Mervin Kung CMA Duncan Dull),  August 04, 2010 1:35 PM  Follow-up for Phone Call        ok to refill x 3 Follow-up by: D. Thomos Lemons DO,  August 04, 2010 5:03 PM  Additional Follow-up for Phone Call Additional follow up Details #1::        Rx called to pharmacy to [facility] Additional Follow-up by: Glendell Docker CMA,  August 07, 2010 8:57 AM    Prescriptions: Prudy Feeler 0.5 MG TABS (ALPRAZOLAM) Take 1 tablet by mouth three times a day  #90 x 3   Entered by:   Glendell Docker CMA   Authorized by:   D. Thomos Lemons DO   Signed by:   Glendell Docker CMA on 08/07/2010   Method used:   Telephoned to ...       [facility] [address] [phone]* (retail)       [address]       [place], [place]  [address]       Ph: [phone] or [phone]       Fax: [phone]   RxID:   [id number]

## 2010-12-28 NOTE — Progress Notes (Signed)
Summary: Pantoprazole Rx  Phone Note Call from Patient Call back at Home Phone [phone]   Caller: Patient Call For: D. Thomos Lemons DO Summary of Call: patient called and left voice message requesting a printed rx for Pantoprazole. Her message states that she can not afford the cost of the Zegerid right now. If approved she is requesting the rx be mailed to her home address Initial call taken by: Glendell Docker CMA,  November 02, 2010 2:40 PM  Follow-up for Phone Call        call returned to patient at [phone], she has been advised rx was approved and will be mailed to her home address on file.  Follow-up by: Glendell Docker CMA,  November 03, 2010 1:45 PM    New/Updated Medications: PROTONIX 40 MG TBEC (PANTOPRAZOLE SODIUM) one by mouth once daily [BMN] Prescriptions: PROTONIX 40 MG TBEC (PANTOPRAZOLE SODIUM) one by mouth once daily Brand medically necessary #90 x 1   Entered and Authorized by:   D. Thomos Lemons DO   Signed by:   D. Thomos Lemons DO on 11/03/2010   Method used:   Print then Mail to Patient   RxID:   [id number]

## 2010-12-28 NOTE — Progress Notes (Signed)
Summary: requesting oxycodone  Phone Note Refill Request Call back at [phone]   Summary of Call: Pt called and stated she has had some vaginal pain and bleeding that is now stopping but the Dilaudid does not seem to be helping. She is requesting Oxycodone. Please advise.  Mervin Kung CMA  Apr 25, 2010 12:21 PM   Follow-up for Phone Call        she needs to follow up with pain mgt specialist Follow-up by: D. Thomos Lemons DO,  April 26, 2010 12:13 PM  Additional Follow-up for Phone Call Additional follow up Details #1::        Pt notified to contact pain management specialist. She states they cannot see her until 05/12/10.  I advised her to contact them to see if they can see her earlier.  Pt not happy but voices understanding. Mervin Kung CMA  April 26, 2010 2:23 PM

## 2010-12-28 NOTE — Progress Notes (Signed)
Summary: Lab Results  Phone Note Outgoing Call   Summary of Call: call pt - urinalysis normal Initial call taken by: D. Thomos Lemons DO,  July 13, 2010 12:08 PM  Follow-up for Phone Call        attempted to contact patient at [phone], no answer. A detailed voice message was ledft informing patient per Dr Artist Pais instructions. She was advised to call back if any questions Follow-up by: Glendell Docker CMA,  July 14, 2010 12:40 PM

## 2010-12-28 NOTE — Progress Notes (Signed)
Summary: Lab Work  Programme researcher, broadcasting/film/video of Call: call pt - pt's blood sugar was low - 47.  Is pt taking any diabetes medication we don't know about.   If no,  pt needs to come in for additional blood tests Initial call taken by: D. Thomos Lemons DO,  October 24, 2010 10:31 AM  Follow-up for Phone Call        call placed to patient at [phone], she states that she has not been taking any other medications , and she does not know why her blood sugar would be low. She was informed that Dr Artist Pais would like for her to have additional blood test done. She states she will have that done on Wednesday Follow-up by: Glendell Docker CMA,  October 24, 2010 11:02 AM  New Problems: HYPOGLYCEMIA (ICD-251.2)   New Problems: HYPOGLYCEMIA (ICD-251.2)

## 2010-12-28 NOTE — Progress Notes (Signed)
Summary: request pain med  Phone Note Call from Patient Call back at Home Phone [phone] Call back at [phone]   Caller: Patient Call For: Sandford Craze, np Summary of Call: Pt called stating the pain in her legs is not any better. She states she has taken 2 Vicoden at a time as 1 did not seem to be helping.  Pt states the pain has not stopped since her last visit here and wants something else called in for her pain.  She specifically asked for Tramadol. She wants enough med to last until 04/14/10 when she sees pain management doctor.  Please advise?    Mervin Kung CMA  Mar 29, 2010 1:08 PM   Follow-up for Phone Call        Dr Artist Pais does not want her to be on Tramadol long term.  We could try a prednisone taper, however she will need to come in to be seen as she will need teaching on glucose meter due to history of high sugars.  Please notify patient. Follow-up by: Lemont Fillers FNP,  Mar 29, 2010 1:41 PM  Additional Follow-up for Phone Call Additional follow up Details #1::        Advised pt. of instructions per Sandford Craze, NP. Pt states that Dr. Thurston Hole (ortho on [address]) gave her Prednisone 10mg  pack to take for her pain. She took a couple of days but stopped because it did not seem to help. She states she would like just enough of Vicodin to last her until 04/14/10 as it did take the edge off her pain. She states that she did double up on the original script and is now out. Please advise?   Mervin Kung CMA  Mar 29, 2010 2:46 PM     Additional Follow-up for Phone Call Additional follow up Details #2::    I am not comfortable giving her further refills.  Dr. Artist Pais will in tomorrow AM- if he agrees to give refill we will let her know otherwise.  She should use Tylenol 1000mg  by mouth every 6 hours as needed for pain in the meantime. thanks  Left message on machine to return my call.  Mervin Kung CMA  Mar 29, 2010 4:03 PM   Advised pt. of Melissa's  instructions. Pt. states she has enough Vicodin to get her through Wednesday and she would like enough called in to get her through 04/14/10.  Mervin Kung CMA  Mar 30, 2010 8:53 AM  Follow-up by: Lemont Fillers FNP,  Mar 29, 2010 3:50 PM  Additional Follow-up for Phone Call Additional follow up Details #3:: Details for Additional Follow-up Action Taken:  ok to call in one refill of vicodin  # 30 Additional Follow-up by: D. Thomos Lemons DO,  Mar 30, 2010 10:34 AM  New/Updated Medications: VICODIN 5-500 MG TABS (HYDROCODONE-ACETAMINOPHEN) one tablet by mouth every 6 hours as needed for severe pain. Prescriptions: VICODIN 5-500 MG TABS (HYDROCODONE-ACETAMINOPHEN) one tablet by mouth every 6 hours as needed for severe pain.  #30 x 0   Entered by:   Glendell Docker CMA   Authorized by:   D. Thomos Lemons DO   Signed by:   Glendell Docker CMA on 03/30/2010   Method used:   Telephoned to ...       [facility] [address] [phone]* (retail)       [address]       [place]  [place], [place]  [address]       Ph: [phone] or [phone]       Fax: [phone]   RxID:   [id number]  Rx called to pharmacy to [facility], patient advised rx sent to pharmacy Glendell Docker CMA  Mar 30, 2010 2:33 PM

## 2010-12-28 NOTE — Progress Notes (Signed)
Summary: Pain Medication  Phone Note Call from Patient Call back at Home Phone [phone]   Caller: Patient Call For: D. Thomos Lemons DO Reason for Call: Refill Medication Summary of Call: patient called and left voice message requesting refill for a stronger pain medication. Her message states she is continuing to have pain in her foot, ankle and leg. She states she is having to take 8 pain pills a day.  Initial call taken by: Glendell Docker CMA,  Apr 06, 2010 10:22 AM  Follow-up for Phone Call        please check status of pain clinic referral.  If no appt within 1 week, pt needs f/u ov before we can change pain med Follow-up by: D. Thomos Lemons DO,  Apr 06, 2010 12:41 PM  Additional Follow-up for Phone Call Additional follow up Details #1::        Called patient   she has appt Pain Clinic   May 20th  ,she will keep appt.    Pt said she wants to go to granddaughter gradution in [place] this weekend and can she have enough to get her thru this weekend  Additional Follow-up by: Darral Dash,  Apr 06, 2010 1:51 PM    Additional Follow-up for Phone Call Additional follow up Details #2::    attempted  to contact patient at [phone], no answer, voice message left informing patient to return call Follow-up by: Glendell Docker CMA,  Apr 06, 2010 5:34 PM  Additional Follow-up for Phone Call Additional follow up Details #3:: Details for Additional Follow-up Action Taken: patient advised to shcedule  office visit per Dr Artist Pais. She was provided and appointment for 04/07/2010 @ 1:30p Additional Follow-up by: Glendell Docker CMA,  Apr 07, 2010 8:11 AM

## 2010-12-28 NOTE — Letter (Signed)
Summary: [facility] Instructions  [facility]  [address] [place], [place] [address]   Phone: [phone]  Fax: [phone]       ZHANE DONLAN    77 years old    MRN: [id number]       Procedure Day Dorna Bloom:  Duanne Limerick  01/23/10     Arrival Time: 10:00AM     Procedure Time:  11:00AM     Location of Procedure:                    _ X_  [facility] (4th Floor)    PREPARATION FOR COLONOSCOPY WITH MIRALAX  Starting 5 days prior to your procedure 2/23/11_ do not eat nuts, seeds, popcorn, corn, beans, peas,  salads, or any raw vegetables.  Do not take any fiber supplements (e.g. Metamucil, Citrucel, and Benefiber). ____________________________________________________________________________________________________   THE DAY BEFORE YOUR PROCEDURE         DATE: 01/22/10  DAY: SUNDAY  1   Drink clear liquids the entire day-NO SOLID FOOD  2   Do not drink anything colored red or purple.  Avoid juices with pulp.  No orange juice.  3   Drink at least 64 oz. (8 glasses) of fluid/clear liquids during the day to prevent dehydration and help the prep work efficiently.  CLEAR LIQUIDS INCLUDE: Water Jello Ice Popsicles Tea (sugar ok, no milk/cream) Powdered fruit flavored drinks Coffee (sugar ok, no milk/cream) Gatorade Juice: apple, white grape, white cranberry  Lemonade Clear bullion, consomm, broth Carbonated beverages (any kind) Strained chicken noodle soup Hard Candy  4   Mix the entire bottle of Miralax with 64 oz. of Gatorade/Powerade in the morning and put in the refrigerator to chill.  5   At 3:00 pm take 2 Dulcolax/Bisacodyl tablets.  6   At 4:30 pm take one Reglan/Metoclopramide tablet.  7  Starting at 5:00 pm drink one 8 oz glass of the Miralax mixture every 15-20 minutes until you have finished drinking the entire 64 oz.  You should finish drinking prep around 7:30 or 8:00 pm.  8   If you are nauseated, you may take the 2nd Reglan/Metoclopramide  tablet at 6:30 pm.        9    At 8:00 pm take 2 more DULCOLAX/Bisacodyl tablets.     THE DAY OF YOUR PROCEDURE      DATE:  01/23/10  DAY: Duanne Limerick  You may drink clear liquids until 9:00AM  (2 HOURS BEFORE PROCEDURE).   MEDICATION INSTRUCTIONS  Unless otherwise instructed, you should take regular prescription medications with a small sip of water as early as possible the morning of your procedure.   Additional medication instructions: _Continue your plavix  _         OTHER INSTRUCTIONS  You will need a responsible adult at least 75 years old to accompany you and drive you home.   This person must remain in the waiting room during your procedure.  Wear loose fitting clothing that is easily removed.  Leave jewelry and other valuables at home.  However, you may wish to bring a book to read or an iPod/MP3 player to listen to music as you wait for your procedure to start.  Remove all body piercing jewelry and leave at home.  Total time from sign-in until discharge is approximately 2-3 hours.  You should go home directly after your procedure and rest.  You can resume normal activities the day after your procedure.  The day  of your procedure you should not:   Drive   Make legal decisions   Operate machinery   Drink alcohol   Return to work  You will receive specific instructions about eating, activities and medications before you leave.   The above instructions have been reviewed and explained to me by   Clide Cliff, RN______________________    I fully understand and can verbalize these instructions _____________________________ Date _______

## 2010-12-28 NOTE — Letter (Signed)
Summary: Primary Care Consult Scheduled Letter  [place] at [facility]  [address]   [place], [place] [address]   Phone: [phone]  Fax: [phone]      03/23/2010 MRN: [id number]  [facility] [address] [place], [place]  [address]    Dear Ms. Hamberger,      We have scheduled an appointment for you.  At the recommendation of Sandford Craze FNP , we have scheduled you a consult with DR [facility]  on MAY 20,2011 at 10AM.  Their address is_[address], [place]   [address] . The office phone number is [phone].  If this appointment day and time is not convenient for you, please feel free to call the office of the doctor you are being referred to at the number listed above and reschedule the appointment.     It is important for you to keep your scheduled appointments. We are here to make sure you are given good patient care. If you have questions or you have made changes to your appointment, please notify us at  [phone], ask for HELEN.    Thank you,  Patient Care Coordinator [place] at [facility]

## 2010-12-28 NOTE — Progress Notes (Signed)
Summary: Mammogram Order/referral  Phone Note Call from Patient Call back at Home Phone [phone]   Caller: Patient Call For: D. Thomos Lemons DO Summary of Call: patient called and left voice message stating [facility] no longer accepts her insurance for fher mammogram.  She would like to know if Dr Artist Pais would provide a referral to [facility] in [place] to have her mammorgram done. The phone number there is [phone] and the fax numnber is [phone] Ned Card. Initial call taken by: Glendell Docker CMA,  October 04, 2010 2:17 PM  Follow-up for Phone Call        Pt scheduled @  [facility]  in [place] [facility]   Need order Follow-up by: Darral Dash,  October 05, 2010 2:35 PM  New Problems: HEALTH MAINTENANCE EXAM (ICD-V70.0)   New Problems: HEALTH MAINTENANCE EXAM (ICD-V70.0)

## 2010-12-28 NOTE — Letter (Signed)
Summary: Previsit letter  [facility]  [address] [place], [place] [address]   Phone: [phone]  Fax: [phone]       12/08/2009 MRN: [id number]  [facility] [address] [place], [place]  [address]  Dear Ms. Anastasia,  Welcome to the Gastroenterology Division at [facility].    You are scheduled to see a nurse for your pre-procedure visit on 01/09/2010 at 9:00AM on the 3rd floor at [facility], [address]  We ask that you try to arrive at our office 15 minutes prior to your appointment time to allow for check-in.  Your nurse visit will consist of discussing your medical and surgical history, your immediate family medical history, and your medications.    Please bring a complete list of all your medications or, if you prefer, bring the medication bottles and we will list them.  We will need to be aware of both prescribed and over the counter drugs.  We will need to know exact dosage information as well.  If you are on blood thinners (Coumadin, Plavix, Aggrenox, Ticlid, etc.) please call our office today/prior to your appointment, as we need to consult with your physician about holding your medication.   Please be prepared to read and sign documents such as consent forms, a financial agreement, and acknowledgement forms.  If necessary, and with your consent, a friend or relative is welcome to sit-in on the nurse visit with you.  Please bring your insurance card so that we may make a copy of it.  If your insurance requires a referral to see a specialist, please bring your referral form from your primary care physician.  No co-pay is required for this nurse visit.     If you cannot keep your appointment, please call [phone] to cancel or reschedule prior to your appointment date.  This allows [place] the opportunity to schedule an appointment for another patient in need of care.    Thank you for choosing [facility] for your medical needs.  We  appreciate the opportunity to care for you.  Please visit [place] at our website  to learn more about our practice.                     Sincerely.                                                                                                                   The Gastroenterology Division

## 2010-12-28 NOTE — Progress Notes (Signed)
Summary: pain meds  Phone Note Call from Patient Call back at [phone]  (cell)   Caller: Patient Call For: Dr. Juanda Chance Reason for Call: Refill Medication Summary of Call: pt would like refill of pain meds... [facility] pharmacy Initial call taken by: Vallarie Mare,  December 08, 2009 2:39 PM  Follow-up for Phone Call        Rx sent. Follow-up by: Hortense Ramal CMA Duncan Dull),  December 08, 2009 2:41 PM    New/Updated Medications: TRAMADOL HCL 50 MG  TABS (TRAMADOL HCL) Take 1 tablet by mouth every day as needed. MUST KEEP APPT FOR COLONOSCOPY FOR FURTHER REFILLS! Prescriptions: TRAMADOL HCL 50 MG  TABS (TRAMADOL HCL) Take 1 tablet by mouth every day as needed. MUST KEEP APPT FOR COLONOSCOPY FOR FURTHER REFILLS!  #30 x 1   Entered by:   Hortense Ramal CMA (AAMA)   Authorized by:   Hart Carwin MD   Signed by:   Hortense Ramal CMA (AAMA) on 12/08/2009   Method used:   Electronically to        [facility]* (retail)       [address]       [place], [place]  [id number]       Ph: [phone]       Fax: [phone]   RxID:   [id number]

## 2010-12-28 NOTE — Consult Note (Signed)
Summary: [facility]  [facility]   Imported By: Lanelle Bal 11/07/2010 12:46:38  _____________________________________________________________________  External Attachment:    Type:   Image     Comment:   External Document

## 2010-12-28 NOTE — Letter (Signed)
   [place] at [facility] [address] [place], [place]  [address]  [place] Phone: [phone]      October 24, 2010   Angela Wang [address] [place], [place] [address]  RE:  LAB RESULTS  Dear  [name],  The following is an interpretation of your most recent lab tests.  Please take note of any instructions provided or changes to medications that have resulted from your lab work.  ELECTROLYTES:  Good - no changes needed  KIDNEY FUNCTION TESTS:  Good - no changes needed     CBC:  Stable - no changes needed       Sincerely Yours,    Dr. Thomos Lemons  Appended Document:  mailed

## 2010-12-28 NOTE — Assessment & Plan Note (Signed)
Summary: 4 month ofllow up/mhf   Vital Signs:  Patient profile:   77 year old female Weight:      160.50 pounds BMI:     26.00 O2 Sat:      100 % on Room air Temp:     98.1 degrees F oral Pulse rate:   76 / minute Pulse rhythm:   regular Resp:     16 per minute BP sitting:   142 / 70  (right arm) Cuff size:   large  Vitals Entered By: Glendell Docker CMA (December 13, 2009 11:19 AM)  O2 Flow:  Room air  Primary Care Provider:  Thomos Lemons, DO  CC:  4 Month Follow up .  History of Present Illness: 4 Month Follow up  77 y/o AA female for follow up.  Anxiety - she has not been taking her lexapro on a regular basis.  she has been on alprazolam for years.    she is unable to taper.  citalopram not helpful.  recent family event caused more stress  htn - stable  Allergies: 1)  ! Codeine 2)  ! Asa 3)  ! Reglan 4)  ! Vicodin 5)  ! Lorazepam (Lorazepam) 6)  ! Amitriptyline Hcl (Amitriptyline Hcl) 7)  ! * Latex 8)  Tylox (Oxycodone-Acetaminophen)  Past History:  Past Medical History: Allergic rhinitis Anemia-NOS Anxiety   Depression  History of Diabetes mellitus, type II (diet controlled)  Hyperlipidemia Hypertension Osteoarthritis Cerebrovascular accident, hx of  History of Right Pulmonary Nodule (stable by CT) Diverticulosis Fibromyalgia Irritable Bowel Syndrome    GERD Atypical C/P - negative myoview 09/2008 Leukopenia, chronic  Hx of chronic sinusitis  Past Surgical History: Appendectomy  Cholecystectomy   Hysterectomy  Total knee replacement  -  right        Family History: Mother deceased age 59 - pancreatic cancer; rheumatism Father deceased age 35 - CAD Brother-living age 52; prostate cancer; allergies Sister-living age 66; breast cancer; asthma  Family History of Ovarian Cancer: Family History of Diabetes:     Family History of Colon Cancer:Maternal Aunt     Social History: Widowed  4 children   Alcohol use-no   Tobacco use-no Retired  from tobacco company        Physical Exam  General:  alert, well-developed, and well-nourished.   Lungs:  Clear throughout to auscultation. Heart:  Regular rate and rhythm; no murmurs, rubs or bruits. Extremities:  trace left pedal edema and trace right pedal edema.   Psych:  normally interactive and good eye contact.     Impression & Recommendations:  Problem # 1:  ANXIETY (ICD-300.00) Pt has been using lexapro on as needed basis.  pt advised to take 1/2 of lexapro daily. citalopram not effective.  pt unable to taper xanax.  she also tried longer acting lorazepam which did not help  The following medications were removed from the medication list:    Citalopram Hydrobromide 20 Mg Tabs (Citalopram hydrobromide) .Marland Kitchen... 1/2 tablet by mouth once daily Her updated medication list for this problem includes:    Xanax 0.5 Mg Tabs (Alprazolam) .Marland Kitchen... Take 1 tablet by mouth three times a day    Lexapro 10 Mg Tabs (Escitalopram oxalate) .Marland Kitchen... Take 1 tablet by mouth once a day  Problem # 2:  HYPERTENSION (ICD-401.9) Isolated higher reading today.  monitor.  continue amlodipine Her updated medication list for this problem includes:    Amlodipine Besylate 2.5 Mg Tabs (Amlodipine besylate) ..... One by mouth qd  BP today: 142/70 Prior BP: 110/72 (09/20/2009)  Labs Reviewed: K+: 4.1 (12/08/2009) Creat: : 0.8 (12/08/2009)   Chol: 196 (02/01/2009)   HDL: 85.8 (02/01/2009)   LDL: 98 (02/01/2009)   TG: 61 (02/01/2009)  Problem # 3:  INSOMNIA, CHRONIC (ICD-307.42) she stopped amitriptyline - question throat swelling  Complete Medication List: 1)  Zegerid 40-1100 Mg Caps (Omeprazole-sodium bicarbonate) .... Take 1 capsule by mouth once a day 2)  Calcium 500/d 500-200 Mg-unit Tabs (Calcium carbonate-vitamin d) .... By mouth three times a day 3)  Co-enzyme Q-10 30 Mg Caps (Coenzyme q10) .... Three times a day by mouth 4)  Plavix 75 Mg Tabs (Clopidogrel bisulfate) .... By mouth once daily 5)   Gabapentin 300 Mg Caps (Gabapentin) .... One by mouth three times a day 6)  Voltaren 1 % Gel (Diclofenac sodium) .... Apply three times a day prn 7)  Vitamin B1  .... Take 1 tablet three times a day as needed 8)  Magnesium  .... Take 1 tablet by mouth once a day 9)  Tramadol Hcl 50 Mg Tabs (Tramadol hcl) .... Take 1 tablet by mouth every day as needed. must keep appt for colonoscopy for further refills! 10)  Amlodipine Besylate 2.5 Mg Tabs (Amlodipine besylate) .... One by mouth qd 11)  Xanax 0.5 Mg Tabs (Alprazolam) .... Take 1 tablet by mouth three times a day 12)  Lexapro 10 Mg Tabs (Escitalopram oxalate) .... Take 1 tablet by mouth once a day  Patient Instructions: 1)  Please schedule a follow-up appointment in 6 months. Prescriptions: XANAX 0.5 MG TABS (ALPRAZOLAM) Take 1 tablet by mouth three times a day  #90 x 1   Entered and Authorized by:   D. Thomos Lemons DO   Signed by:   D. Thomos Lemons DO on 12/13/2009   Method used:   Print then Give to Patient   RxID:   [id number] LEXAPRO 10 MG TABS (ESCITALOPRAM OXALATE) Take 1 tablet by mouth once a day  #90 x 1   Entered and Authorized by:   D. Thomos Lemons DO   Signed by:   D. Thomos Lemons DO on 12/13/2009   Method used:   Print then Give to Patient   RxID:   [id number] AMLODIPINE BESYLATE 2.5 MG TABS (AMLODIPINE BESYLATE) one by mouth qd  #90 x 1   Entered and Authorized by:   D. Thomos Lemons DO   Signed by:   D. Thomos Lemons DO on 12/13/2009   Method used:   Print then Give to Patient   RxID:   [id number] GABAPENTIN 300 MG CAPS (GABAPENTIN) one by mouth three times a day  #90 x 1   Entered and Authorized by:   D. Thomos Lemons DO   Signed by:   D. Thomos Lemons DO on 12/13/2009   Method used:   Print then Give to Patient   RxID:   [id number] PLAVIX 75 MG  TABS (CLOPIDOGREL BISULFATE) by mouth once daily  #90 x 1   Entered and Authorized by:   D. Thomos Lemons DO   Signed by:   D. Thomos Lemons DO on 12/13/2009   Method used:    Print then Give to Patient   RxID:   [id number] ZEGERID 40-1100 MG CAPS (OMEPRAZOLE-SODIUM BICARBONATE) Take 1 capsule by mouth once a day  #90 x 1   Entered and Authorized by:   D. Thomos Lemons DO   Signed by:   D. Thomos Lemons DO on 12/13/2009   Method  used:   Print then Give to Patient   RxID:   [id number]    Immunization History:  Influenza Immunization History:    Influenza:  historical (09/12/2009)    Current Allergies (reviewed today): ! CODEINE ! ASA ! REGLAN ! VICODIN ! LORAZEPAM (LORAZEPAM) ! AMITRIPTYLINE HCL (AMITRIPTYLINE HCL) ! * LATEX TYLOX (OXYCODONE-ACETAMINOPHEN)

## 2010-12-28 NOTE — Progress Notes (Signed)
Summary: Brand Zegerid 40-1100  Phone Note Call from Patient Call back at Home Phone [phone]   Caller: Patient Call For: D. Thomos Lemons DO Summary of Call: patient called and left voice message requesting rx for brand name Zegerid 40-1100 [facility] Initial call taken by: Glendell Docker CMA,  October 23, 2010 3:57 PM  Follow-up for Phone Call        ok to refill # 30 x 5 of zegerid Follow-up by: D. Thomos Lemons DO,  October 23, 2010 4:36 PM  Additional Follow-up for Phone Call Additional follow up Details #1::        Rx sent to pharmacy, patient advised Additional Follow-up by: Glendell Docker CMA,  October 23, 2010 4:52 PM    New/Updated Medications: ZEGERID 40-1100 MG CAPS (OMEPRAZOLE-SODIUM BICARBONATE) Take 1 capsule by mouth once a day [BMN] Prescriptions: ZEGERID 40-1100 MG CAPS (OMEPRAZOLE-SODIUM BICARBONATE) Take 1 capsule by mouth once a day Brand medically necessary #30 x 5   Entered by:   Glendell Docker CMA   Authorized by:   D. Thomos Lemons DO   Signed by:   Glendell Docker CMA on 10/23/2010   Method used:   [facility] [phone]* (retail)       [address]       [place], [place]  [address]       Ph: [phone] or [phone]       Fax: [phone]   RxID:   [id number]

## 2010-12-28 NOTE — Letter (Signed)
Summary: [facility]  [facility]   Imported By: Lanelle Bal 04/05/2010 12:12:32  _____________________________________________________________________  External Attachment:    Type:   Image     Comment:   External Document

## 2010-12-28 NOTE — Assessment & Plan Note (Signed)
Summary: 6 MO. F/U - JR   Vital Signs:  Patient profile:   77 year old female Weight:      156.25 pounds BMI:     25.31 O2 Sat:      100 % on Room air Temp:     98.0 degrees F oral Pulse rate:   85 / minute Pulse rhythm:   regular Resp:     16 per minute BP sitting:   140 / 80  (right arm) Cuff size:   regular  Vitals Entered By: Glendell Docker CMA (June 13, 2010 9:58 AM)  O2 Flow:  Room air CC: Rm 3- 6 Month Follow up Is Patient Diabetic? No Pain Assessment Patient in pain? no       Does patient need assistance? Functional Status Self care Ambulation Normal Comments she waas informed that she has cataracts on Friday, was they did not need to be removed , she would like to know if she could get a higher dose of Alprazolam to help relax her,  She completed physical therapy 2 weeks ago,  pain has imporved and she was given exercises to do, twice a day.   Primary Care Provider:  Dondra Spry, DO  CC:  Rm 3- 6 Month Follow up.  History of Present Illness: 77 y/o AA female for f/u she has long hx of anxiety tx with alprazolam we have tried to use several SSRIs. she has not tolerated   - she prev reported lexapro caused hallucinations sertraline makes her feel "hyper"  she notes current alprazolam dose not strong enough she denies panic attacks but feels "on edge"   Allergies: 1)  ! Codeine 2)  ! Asa 3)  ! Seldane 4)  ! Vicodin 5)  ! Lorazepam (Lorazepam) 6)  ! Amitriptyline Hcl (Amitriptyline Hcl) 7)  ! * Lexapro 8)  ! * Latex 9)  Tylox (Oxycodone-Acetaminophen)  Past History:  Past Medical History: Allergic rhinitis Anemia-NOS Anxiety     Depression   History of Diabetes mellitus, type II (diet controlled)   Hyperlipidemia Hypertension Osteoarthritis Cerebrovascular accident, hx of  History of Right Pulmonary Nodule (stable by CT) Diverticulosis Fibromyalgia Irritable Bowel Syndrome     GERD Atypical C/P - negative myoview 09/2008 Leukopenia,  chronic  Hx of chronic sinusitis  Family History: Mother deceased age 66 - pancreatic cancer; rheumatism Father deceased age 60 - CAD Brother-living age 12; prostate cancer; allergies Sister-living age 21; breast cancer; asthma  Family History of Ovarian Cancer: Family History of Diabetes:      Family History of Colon Cancer:Maternal Aunt         Social History: Widowed  4 children   Alcohol use-no    Tobacco use-no  Retired from tobacco company           Physical Exam  General:  alert, well-developed, and well-nourished.   Lungs:  normal respiratory effort and normal breath sounds.   Heart:  normal rate, regular rhythm, and no gallop.   Psych:  normally interactive and good eye contact.    Diabetes Management Exam:    Eye Exam:       Eye Exam done elsewhere          Date: 06/09/2010          Results: normal          Done by: Dr Oneal Grout- [facility]    Impression & Recommendations:  Problem # 1:  ANXIETY (ICD-300.00) sertraline makes her feel "  hyper".  I would not rec increasing her alprazolam dose.  I suggest referral to psych other tx options for anxiety.  The following medications were removed from the medication list:    Zoloft 25 Mg Tabs (Sertraline hcl) .Marland Kitchen... 1/2 by mouth once daily x 7 days, then one by mouth once daily Her updated medication list for this problem includes:    Xanax 0.5 Mg Tabs (Alprazolam) .Marland Kitchen... Take 1 tablet by mouth three times a day  Orders: Psychiatric Referral (Psych)  Complete Medication List: 1)  Zegerid 40-1100 Mg Caps (Omeprazole-sodium bicarbonate) .... Take 1 capsule by mouth once a day 2)  Calcium 500/d 500-200 Mg-unit Tabs (Calcium carbonate-vitamin d) .... By mouth three times a day 3)  Co-enzyme Q-10 30 Mg Caps (Coenzyme q10) .... Three times a day by mouth 4)  Plavix 75 Mg Tabs (Clopidogrel bisulfate) .... By mouth once daily 5)  Gabapentin 300 Mg Caps (Gabapentin) .... One by mouth three times a day 6)   Voltaren 1 % Gel (Diclofenac sodium) .... Apply three times a day prn 7)  B-100 Tabs (Vitamins-lipotropics) .... Take 1 tablet by mouth once a day b 8)  Magnesium 500 Mg Tabs (Magnesium) .... Take 1 tablet by mouth once a day 9)  Xanax 0.5 Mg Tabs (Alprazolam) .... Take 1 tablet by mouth three times a day 10)  Nasonex 50 Mcg/act Susp (Mometasone furoate) .... 2 sprays each nostril qd 11)  Furosemide 20 Mg Tabs (Furosemide) .... One by mouth once daily 12)  Lotemax 0.5 % Susp (Loteprednol etabonate) .... One drop right eye two times a day  Patient Instructions: 1)  Please schedule a follow-up appointment in 4 months. Prescriptions: XANAX 0.5 MG TABS (ALPRAZOLAM) Take 1 tablet by mouth three times a day  #90 x 1   Entered and Authorized by:   D. Thomos Lemons DO   Signed by:   D. Thomos Lemons DO on 06/13/2010   Method used:   Print then Give to Patient   RxID:   [id number]   Current Allergies (reviewed today): ! CODEINE ! ASA ! SELDANE ! VICODIN ! LORAZEPAM (LORAZEPAM) ! AMITRIPTYLINE HCL (AMITRIPTYLINE HCL) ! * LEXAPRO ! * LATEX TYLOX (OXYCODONE-ACETAMINOPHEN)   Preventive Care Screening  Pap Smear:    Date:  03/14/2010    Results:  normal   Mammogram:    Date:  09/12/2009    Results:  normal

## 2011-01-02 ENCOUNTER — Encounter: Payer: Self-pay | Admitting: Internal Medicine

## 2011-01-02 ENCOUNTER — Ambulatory Visit (INDEPENDENT_AMBULATORY_CARE_PROVIDER_SITE_OTHER): Payer: MEDICARE | Admitting: Internal Medicine

## 2011-01-02 ENCOUNTER — Encounter (INDEPENDENT_AMBULATORY_CARE_PROVIDER_SITE_OTHER): Payer: Self-pay | Admitting: *Deleted

## 2011-01-02 DIAGNOSIS — J329 Chronic sinusitis, unspecified: Secondary | ICD-10-CM

## 2011-01-02 DIAGNOSIS — E162 Hypoglycemia, unspecified: Secondary | ICD-10-CM

## 2011-01-02 DIAGNOSIS — K219 Gastro-esophageal reflux disease without esophagitis: Secondary | ICD-10-CM

## 2011-01-03 NOTE — Progress Notes (Signed)
Summary: Meclizine  Phone Note Call from Patient Call back at Home Phone [phone]   Caller: Patient Call For: D. Thomos Lemons DO Summary of Call: patient called and left voice messge requesting a rx for Meclizine 25mg  for dizziness. If approved she is requesting the rx to [facility] pharmacy Initial call taken by: Glendell Docker CMA,  December 26, 2010 2:38 PM  Follow-up for Phone Call        If we have not prescribed medication in the past, we can not call in w/o first assessing pt Follow-up by: D. Thomos Lemons DO,  December 26, 2010 4:24 PM  Additional Follow-up for Phone Call Additional follow up Details #1::        call returned to patient at [phone], she has been advised office visit is needed. She states she last had the medication prescribed in 2008. Patient was advised to shcedule appointment. Sent to appointments to schedule Additional Follow-up by: Glendell Docker CMA,  December 26, 2010 4:42 PM

## 2011-01-08 ENCOUNTER — Emergency Department (HOSPITAL_COMMUNITY): Payer: MEDICARE

## 2011-01-08 ENCOUNTER — Emergency Department (HOSPITAL_COMMUNITY)
Admission: EM | Admit: 2011-01-08 | Discharge: 2011-01-09 | Disposition: A | Discharge status: Home / Self Care | Payer: MEDICARE | Attending: Emergency Medicine | Admitting: Emergency Medicine

## 2011-01-08 DIAGNOSIS — R51 Headache: Secondary | ICD-10-CM | POA: Insufficient documentation

## 2011-01-08 DIAGNOSIS — J329 Chronic sinusitis, unspecified: Secondary | ICD-10-CM | POA: Insufficient documentation

## 2011-01-08 DIAGNOSIS — K219 Gastro-esophageal reflux disease without esophagitis: Secondary | ICD-10-CM | POA: Insufficient documentation

## 2011-01-08 DIAGNOSIS — M542 Cervicalgia: Secondary | ICD-10-CM | POA: Insufficient documentation

## 2011-01-09 ENCOUNTER — Encounter: Payer: Self-pay | Admitting: Internal Medicine

## 2011-01-09 ENCOUNTER — Telehealth: Payer: Self-pay | Admitting: Internal Medicine

## 2011-01-09 LAB — POCT I-STAT, CHEM 8
BUN: 8 mg/dL (ref 6–23)
Creatinine, Ser: 0.9 mg/dL (ref 0.4–1.2)
Sodium: 140 mEq/L (ref 135–145)
TCO2: 26 mmol/L (ref 0–100)

## 2011-01-11 ENCOUNTER — Encounter: Payer: Self-pay | Admitting: Internal Medicine

## 2011-01-11 ENCOUNTER — Ambulatory Visit (INDEPENDENT_AMBULATORY_CARE_PROVIDER_SITE_OTHER): Payer: MEDICARE | Admitting: Internal Medicine

## 2011-01-11 DIAGNOSIS — J329 Chronic sinusitis, unspecified: Secondary | ICD-10-CM

## 2011-01-11 DIAGNOSIS — M542 Cervicalgia: Secondary | ICD-10-CM

## 2011-01-11 NOTE — Letter (Signed)
Summary: Primary Care Consult Scheduled Letter  [facility]  [address]   [place], [place] [address]   Phone: [phone]  Fax: [phone]      01/02/2011 MRN: [id number]  [facility] [address] [place], [place]  [address]  [place]    Dear Ms. Pichardo,      We have scheduled an appointment for you.  At the recommendation of Dr.YOO, we have scheduled you a consult with [facility] , DR Alvy Beal on APRIL 9,2012 at 12:55PM.  Their address is_[facility] . The office phone number is [phone].  If this appointment day and time is not convenient for you, please feel free to call the office of the doctor you are being referred to at the number listed above and reschedule the appointment.     It is important for you to keep your scheduled appointments. We are here to make sure you are given good patient care.    Thank you,  Darral Dash Patient Care Coordinator [place] [facility]

## 2011-01-16 ENCOUNTER — Encounter: Payer: Self-pay | Admitting: Internal Medicine

## 2011-01-17 NOTE — Progress Notes (Addendum)
Summary: Coverage Appeal -Zegerid  Phone Note Outgoing Call Call back at [phone]   Call placed by: Glendell Docker CMA,  January 09, 2011 11:36 AM Call placed to: [facility] Summary of Call: call placed to [facility] at [phone], per patient request for appeal to obtain coverage for Zegerid 40-1100. Spoke with CSR Patton Salles. Appeal process has been initiated. Medications attempted Omeprazoel, Protonix, and Aciphex provided as medications attempted and failed.  Synetta Fail stated the office should received appeal status within 72 hours. Case ID number [id number] Initial call taken by: Glendell Docker CMA,  January 09, 2011 11:37 AM  Follow-up for Phone Call        Approval recieved from [employer] for Omeprazole  through 11/26/2011 Follow-up by: Glendell Docker CMA,  January 11, 2011 8:28 AM     Appended Document: Coverage Appeal -Zegerid Received fax from pharmacy that zegerid not covered. Called and spoke to [place] and gave auth to fill Gen. Zegerid as this was approved by insurance on 01/10/11. Rx approved.

## 2011-01-23 ENCOUNTER — Ambulatory Visit: Payer: Self-pay | Admitting: Internal Medicine

## 2011-01-23 NOTE — Miscellaneous (Signed)
Summary: zegerid changed to generic  Clinical Lists Changes  Medications: Added new medication of OMEPRAZOLE-SODIUM BICARBONATE 40-1100 MG CAPS (OMEPRAZOLE-SODIUM BICARBONATE) Take 1 tablet by mouth once a day. - Signed Removed medication of ZEGERID 40-1100 MG CAPS (OMEPRAZOLE-SODIUM BICARBONATE) one by mouth once daily [BMN] Rx of OMEPRAZOLE-SODIUM BICARBONATE 40-1100 MG CAPS (OMEPRAZOLE-SODIUM BICARBONATE) Take 1 tablet by mouth once a day.;  #30 x 2;  Signed;  Entered by: Mervin Kung CMA (AAMA);  Authorized by: D. Thomos Lemons DO;  Method used: [facility]  [address] [phone]*, [address], [place], [place], [place]  [address], Ph: [phone] or [phone], Fax: [phone]    Prescriptions: OMEPRAZOLE-SODIUM BICARBONATE 40-1100 MG CAPS (OMEPRAZOLE-SODIUM BICARBONATE) Take 1 tablet by mouth once a day.  #30 x 2   Entered by:   Mervin Kung CMA (AAMA)   Authorized by:   D. Thomos Lemons DO   Signed by:   Mervin Kung CMA (AAMA) on 01/16/2011   Method used:   Telephoned to ...       [facility] [phone]* (retail)       [address]       [place], [place]  [address]       Ph: [phone] or [phone]       Fax: [phone]   RxID:   [id number]

## 2011-01-23 NOTE — Medication Information (Signed)
Summary: Omeprazole Approved  Omeprazole Approved   Imported By: Maryln Gottron 01/18/2011 14:25:37  _____________________________________________________________________  External Attachment:    Type:   Image     Comment:   External Document

## 2011-01-23 NOTE — Letter (Signed)
Summary: [facility]  [facility]   Imported By: Maryln Gottron 01/18/2011 16:01:54  _____________________________________________________________________  External Attachment:    Type:   Image     Comment:   External Document

## 2011-01-23 NOTE — Medication Information (Signed)
Summary: Prior Authorization Request for Zegerid  Prior Authorization Request for Zegerid   Imported By: Maryln Gottron 01/18/2011 12:39:36  _____________________________________________________________________  External Attachment:    Type:   Image     Comment:   External Document

## 2011-01-23 NOTE — Assessment & Plan Note (Signed)
Summary: 3 mth follow up/ss   Vital Signs:  Patient profile:   77 year old female Height:      66 inches Weight:      156 pounds BMI:     25.27 O2 Sat:      97 % on Room air Temp:     97.9 degrees F oral Pulse rate:   76 / minute Resp:     20 per minute BP sitting:   128 / 72  (right arm) Cuff size:   regular  Vitals Entered By: Glendell Docker CMA (January 02, 2011 10:23 AM)  O2 Flow:  Room air CC: 3 month follow up  Is Patient Diabetic? No Pain Assessment Patient in pain? no        Primary Care Provider:  Dondra Spry, DO  CC:  3 month follow up .  History of Present Illness: 77 y/o female for f/u   Int hx: ENT eval-Dr. Annalee Genta CT of sinuses showed deviated septum and chronic right maxillary sinusitis - primarily involving the right maxillary sinus   pt c/o chronic nasal congestion.  took 1200 mg of mucinex.  helped but caused some dizziness.    pt c/o easy bruising  GERD - tried other PPIs.   zegerid seems to only ppi that controls her symptoms  Preventive Screening-Counseling & Management  Alcohol-Tobacco     Smoking Status: never  Allergies: 1)  ! Codeine 2)  ! Asa 3)  ! Seldane 4)  ! Vicodin 5)  ! Lorazepam (Lorazepam) 6)  ! Amitriptyline Hcl (Amitriptyline Hcl) 7)  ! * Lexapro 8)  ! * Latex 9)  Tylox (Oxycodone-Acetaminophen)  Past History:  Past Medical History: Allergic rhinitis Anemia-NOS   Depression   History of Diabetes mellitus, type II (diet controlled)    Hyperlipidemia Hypertension Osteoarthritis Cerebrovascular accident, hx of  History of Right Pulmonary Nodule (stable by CT) Diverticulosis Fibromyalgia Irritable Bowel Syndrome     GERD Atypical C/P - negative myoview 09/2008 Leukopenia, chronic  Hx of chronic sinusitis  Past Surgical History: Appendectomy  Cholecystectomy   Hysterectomy  Total knee replacement  -  right               Family History: Mother deceased age 21 - pancreatic cancer;  rheumatism Father deceased age 39 - CAD Brother-living age 36; prostate cancer; allergies Sister-living age 58; breast cancer; asthma  Family History of Ovarian Cancer: Family History of Diabetes:      Family History of Colon Cancer:Maternal Aunt             Social History: Widowed  4 children   Alcohol use-no    Tobacco use-no    Retired from tobacco company           Review of Systems       chronic fatigue, cold intolerance, easy bruising  Physical Exam  General:  alert, well-developed, and well-nourished.   Ears:  R ear normal and L ear normal.   Nose:  no mucosal pallor and no mucosal edema.   Mouth:  pharynx pink and moist.   Lungs:  normal respiratory effort and normal breath sounds.   Heart:  normal rate, regular rhythm, and no gallop.   Extremities:  trace left pedal edema and trace right pedal edema.     Impression & Recommendations:  Problem # 1:  SINUSITIS, CHRONIC (ICD-473.9) pt seen by ENT  she could not tolerate abx - Avelox, Bactrim, and cefdinir  (due to stomach  upset) we discussed limitations and side effects of long term abx use  pt relatively asymptomatic use nasal saline sprays as directed  Her updated medication list for this problem includes:    Nasonex 50 Mcg/act Susp (Mometasone furoate) .Marland Kitchen... 2 sprays each nostril qd    Bactrim Ds 800-160 Mg Tabs (Sulfamethoxazole-trimethoprim) .Marland Kitchen... 2 tablets by mouth two times a day  Problem # 2:  GERD (ICD-530.81) pt has refractory symptoms despite prev use of nexium, protonix and omeprazole pt would like to continue using zegerid if possible.  she feels it is only agent that adequately controlls her GERD symptoms  The following medications were removed from the medication list:    Protonix 40 Mg Tbec (Pantoprazole sodium) ..... One by mouth once daily Her updated medication list for this problem includes:    Zegerid 40-1100 Mg Caps (Omeprazole-sodium bicarbonate) ..... One by mouth once  daily  Problem # 3:  HYPOGLYCEMIA (ICD-251.2)  Orders: Endocrinology Referral (Endocrine)  Complete Medication List: 1)  Calcium 500/d 500-200 Mg-unit Tabs (Calcium carbonate-vitamin d) .... By mouth three times a day 2)  Co-enzyme Q-10 30 Mg Caps (Coenzyme q10) .... Three times a day by mouth 3)  Plavix 75 Mg Tabs (Clopidogrel bisulfate) .... By mouth once daily 4)  Gabapentin 300 Mg Caps (Gabapentin) .... One by mouth three times a day 5)  B-100 Tabs (Vitamins-lipotropics) .... Take 1 tablet by mouth once a day b 6)  Magnesium 500 Mg Tabs (Magnesium) .... Take 1 tablet by mouth once a day 7)  Xanax 0.5 Mg Tabs (Alprazolam) .... Take 1 tablet by mouth three times a day 8)  Nasonex 50 Mcg/act Susp (Mometasone furoate) .... 2 sprays each nostril qd 9)  Furosemide 20 Mg Tabs (Furosemide) .... One by mouth once daily 10)  Lotemax 0.5 % Susp (Loteprednol etabonate) .... One drop right eye two times a day 11)  Melatonin 1 Mg Tabs (Melatonin) .... 1/2 tab at 4pm and 8 pm 12)  Tramadol Hcl 50 Mg Tabs (Tramadol hcl) .... Take 1 tablet by mouth once a day as needed back pain 13)  Zegerid 40-1100 Mg Caps (Omeprazole-sodium bicarbonate) .... One by mouth once daily 14)  Hydromorphone Hcl 4 Mg Tabs (Hydromorphone hcl) .... One talet by mouth every 6 hours as needed pain 15)  Bactrim Ds 800-160 Mg Tabs (Sulfamethoxazole-trimethoprim) .... 2 tablets by mouth two times a day  Patient Instructions: 1)  Please schedule a follow-up appointment in 2 months. Prescriptions: PLAVIX 75 MG  TABS (CLOPIDOGREL BISULFATE) by mouth once daily Brand medically necessary #90 x 1   Entered and Authorized by:   D. Thomos Lemons DO   Signed by:   D. Thomos Lemons DO on 01/02/2011   Method used:   Print then Give to Patient   RxID:   [id number] ZEGERID 40-1100 MG CAPS (OMEPRAZOLE-SODIUM BICARBONATE) one by mouth once daily Brand medically necessary #90 x 1   Entered and Authorized by:   D. Thomos Lemons DO   Signed by:    D. Thomos Lemons DO on 01/02/2011   Method used:   Print then Give to Patient   RxID:   [id number]    Orders Added: 1)  Endocrinology Referral [Endocrine] 2)  Est. Patient Level III [84696]    Current Allergies (reviewed today): ! CODEINE ! ASA ! SELDANE ! VICODIN ! LORAZEPAM (LORAZEPAM) ! AMITRIPTYLINE HCL (AMITRIPTYLINE HCL) ! * LEXAPRO ! * LATEX TYLOX (OXYCODONE-ACETAMINOPHEN)

## 2011-02-01 NOTE — Assessment & Plan Note (Signed)
Summary: neck hurts to move and stiff/ss   Vital Signs:  Patient profile:   77 year old female Height:      66 inches Weight:      152.75 pounds BMI:     24.74 O2 Sat:      99 % on Room air Temp:     98.6 degrees F oral Pulse rate:   88 / minute Resp:     22 per minute BP sitting:   136 / 80  (right arm) Cuff size:   regular  Vitals Entered By: Glendell Docker CMA (January 11, 2011 1:07 PM)  O2 Flow:  Room air CC: Head Congestion Is Patient Diabetic? No Pain Assessment Patient in pain? no      Comments neck stiffness and head ache, right ear pain, started Bactrim on Monday-requesting refill on pain medication, right side facial swelling, and tingling in her hands   Primary Care Provider:  Dondra Spry, DO  CC:  Head Congestion.  History of Present Illness: 77 y/o was seen in ER for severe headache and neck pain pt given hydromorhone for headache with some improvement in symptoms CT of brain and sinus reviewed: Marland Kitchen No acute intracranial findings. 2. Chronic right maxillary sinusitis /fungal infection.   CT CERVICAL SPINE   Findings: No prevertebral soft tissue swelling. Normal alignment of the cervical vertebral bodies. Normal facet articulation. Normal craniocervical junction. There is mild calcification of the alar ligaments.   No evidence epidural or paraspinal hematoma.   IMPRESSION: No evidence cervical spine fracture       Preventive Screening-Counseling & Management  Alcohol-Tobacco     Smoking Status: never  Allergies: 1)  ! Codeine 2)  ! Asa 3)  ! Seldane 4)  ! Vicodin 5)  ! Lorazepam (Lorazepam) 6)  ! Amitriptyline Hcl (Amitriptyline Hcl) 7)  ! * Lexapro 8)  ! * Latex 9)  Tylox (Oxycodone-Acetaminophen)  Past History:  Past Medical History: Allergic rhinitis Anemia-NOS   Depression   History of Diabetes mellitus, type II (diet controlled)   Hyperlipidemia Hypertension Osteoarthritis Cerebrovascular accident, hx of  History of  Right Pulmonary Nodule (stable by CT) Diverticulosis Fibromyalgia Irritable Bowel Syndrome     GERD Atypical C/P - negative myoview 09/2008 Leukopenia, chronic  Chronic sinusitis  Past Surgical History: Appendectomy  Cholecystectomy   Hysterectomy   Total knee replacement  -  right              Family History: Mother deceased age 76 - pancreatic cancer; rheumatism Father deceased age 3 - CAD Brother-living age 68; prostate cancer; allergies Sister-living age 64; breast cancer; asthma  Family History of Ovarian Cancer: Family History of Diabetes:      Family History of Colon Cancer:Maternal Aunt            Social History: Widowed  4 children   Alcohol use-no    Tobacco use-no   Retired from tobacco company            Physical Exam  General:  alert, well-developed, and well-nourished.   Head:  mild right facial swelling Ears:  R ear normal and L ear normal.   Nose:  no nasal discharge, no airflow obstruction, and no intranasal foreign body.   Mouth:  pharynx pink and moist.   Neck:  no masses.   Lungs:  normal respiratory effort, normal breath sounds, no crackles, and no wheezes.   Heart:  normal rate, regular rhythm, no murmur, and no gallop.  Impression & Recommendations:  Problem # 1:  SINUSITIS, CHRONIC (ICD-62.51) 77 year old white female with chronic sinusitis Patient previously seen by ENT but she has multiple intolerances to antibiotics She did not take previously prescribed antibiotics by ENT CT scan suggests fungal right maxillary sinusitis Discussed patient and CT findings with Dr. Annalee Genta. He agrees that patient will likely require sinus surgery arrange f/u The following medications were removed from the medication list:    Nasonex 50 Mcg/act Susp (Mometasone furoate) .Marland Kitchen... 2 sprays each nostril qd    Bactrim Ds 800-160 Mg Tabs (Sulfamethoxazole-trimethoprim) .Marland Kitchen... 2 tablets by mouth two times a day  Orders: ENT Referral (ENT)  Problem #  2:  NECK PAIN (ICD-723.1) Assessment: Deteriorated  The following medications were removed from the medication list:    Hydromorphone Hcl 4 Mg Tabs (Hydromorphone hcl) ..... One talet by mouth every 6 hours as needed pain Her updated medication list for this problem includes:    Tramadol Hcl 50 Mg Tabs (Tramadol hcl) .Marland Kitchen... Take 1 tablet by mouth once a day as needed back pain    Hydromorphone Hcl 4 Mg Tabs (Hydromorphone hcl) .Marland Kitchen... 1/2 to one tab by mouth once daily as needed  Complete Medication List: 1)  Calcium 500/d 500-200 Mg-unit Tabs (Calcium carbonate-vitamin d) .... By mouth three times a day 2)  Co-enzyme Q-10 30 Mg Caps (Coenzyme q10) .... Three times a day by mouth 3)  Plavix 75 Mg Tabs (Clopidogrel bisulfate) .... By mouth once daily 4)  Gabapentin 300 Mg Caps (Gabapentin) .... One by mouth three times a day 5)  B-100 Tabs (Vitamins-lipotropics) .... Take 1 tablet by mouth once a day b 6)  Magnesium 500 Mg Tabs (Magnesium) .... Take 1 tablet by mouth once a day 7)  Xanax 0.5 Mg Tabs (Alprazolam) .... Take 1 tablet by mouth three times a day 8)  Furosemide 20 Mg Tabs (Furosemide) .... One by mouth once daily 9)  Lotemax 0.5 % Susp (Loteprednol etabonate) .... One drop right eye two times a day 10)  Melatonin 1 Mg Tabs (Melatonin) .... 1/2 tab at 4pm and 8 pm 11)  Tramadol Hcl 50 Mg Tabs (Tramadol hcl) .... Take 1 tablet by mouth once a day as needed back pain 12)  Hydromorphone Hcl 4 Mg Tabs (Hydromorphone hcl) .... 1/2 to one tab by mouth once daily as needed 13)  Omeprazole-sodium Bicarbonate 40-1100 Mg Caps (Omeprazole-sodium bicarbonate) .... Take 1 tablet by mouth once a day.  Patient Instructions: 1)  Our office will contact you re:  referral to Dr. Annalee Genta 2)  Please schedule a follow-up appointment in 2 months. 3)  Stop Nasonex Prescriptions: HYDROMORPHONE HCL 4 MG TABS (HYDROMORPHONE HCL) 1/2 to one tab by mouth once daily as needed  #15 x 0   Entered and  Authorized by:   D. Thomos Lemons DO   Signed by:   D. Thomos Lemons DO on 01/11/2011   Method used:   Print then Give to Patient   RxID:   [id number]    Orders Added: 1)  ENT Referral [ENT] 2)  Est. Patient Level III [44034]    Current Allergies (reviewed today): ! CODEINE ! ASA ! SELDANE ! VICODIN ! LORAZEPAM (LORAZEPAM) ! AMITRIPTYLINE HCL (AMITRIPTYLINE HCL) ! * LEXAPRO ! * LATEX TYLOX (OXYCODONE-ACETAMINOPHEN)

## 2011-02-20 ENCOUNTER — Telehealth: Payer: Self-pay | Admitting: *Deleted

## 2011-02-20 DIAGNOSIS — F411 Generalized anxiety disorder: Secondary | ICD-10-CM

## 2011-02-20 MED ORDER — ALPRAZOLAM 0.5 MG PO TABS: 0.5000 mg | ORAL_TABLET | Freq: Three times a day (TID) | ORAL | Status: DC | PRN

## 2011-02-20 NOTE — Telephone Encounter (Signed)
Call placed to patient at [phone], she was informed per Dr Artist Pais instructions.  [facility] ref[facility] per patient request

## 2011-02-20 NOTE — Telephone Encounter (Signed)
Patient called and left voice message requesting a refill on her Alprazolam. She is wanting Dr Artist Pais to know that she has decided that she did not want to have the surgery done. However she states that she continues to have ear, throat, and head pain, and she states she generally does not feel well. She has only been able to make it to from the bed to the bathroom and back.

## 2011-02-20 NOTE — Telephone Encounter (Signed)
Ok to refill alprazolam x 3. I advise pt follow up with ENT and reconsider her decision re:  Sinus surgery

## 2011-03-03 LAB — COMPREHENSIVE METABOLIC PANEL
ALT: 9 U/L (ref 0–35)
AST: 19 U/L (ref 0–37)
Albumin: 3.5 g/dL (ref 3.5–5.2)
Alkaline Phosphatase: 56 U/L (ref 39–117)
BUN: 6 mg/dL (ref 6–23)
Chloride: 106 mEq/L (ref 96–112)
Potassium: 3.7 mEq/L (ref 3.5–5.1)
Sodium: 140 mEq/L (ref 135–145)
Total Bilirubin: 0.7 mg/dL (ref 0.3–1.2)
Total Protein: 7 g/dL (ref 6.0–8.3)

## 2011-03-03 LAB — CARDIAC PANEL(CRET KIN+CKTOT+MB+TROPI)
CK, MB: 1.3 ng/mL (ref 0.3–4.0)
Relative Index: INVALID (ref 0.0–2.5)
Total CK: 60 U/L (ref 7–177)
Total CK: 61 U/L (ref 7–177)
Troponin I: 0.02 ng/mL (ref 0.00–0.06)
Troponin I: 0.02 ng/mL (ref 0.00–0.06)

## 2011-03-03 LAB — URINALYSIS, ROUTINE W REFLEX MICROSCOPIC
Glucose, UA: NEGATIVE mg/dL
Hgb urine dipstick: NEGATIVE
Protein, ur: NEGATIVE mg/dL

## 2011-03-03 LAB — CBC
HCT: 35.3 % — ABNORMAL LOW (ref 36.0–46.0)
Platelets: 150 10*3/uL (ref 150–400)
Platelets: 175 10*3/uL (ref 150–400)
RDW: 14.9 % (ref 11.5–15.5)
WBC: 3.7 10*3/uL — ABNORMAL LOW (ref 4.0–10.5)
WBC: 4.2 10*3/uL (ref 4.0–10.5)

## 2011-03-03 LAB — LIPID PANEL
LDL Cholesterol: 90 mg/dL (ref 0–99)
Total CHOL/HDL Ratio: 2.5 RATIO
VLDL: 8 mg/dL (ref 0–40)

## 2011-03-03 LAB — PROTIME-INR: Prothrombin Time: 13.5 seconds (ref 11.6–15.2)

## 2011-03-03 LAB — URINE CULTURE

## 2011-03-03 LAB — HEMOGLOBIN A1C: Mean Plasma Glucose: 131 mg/dL

## 2011-03-03 LAB — CK TOTAL AND CKMB (NOT AT ARMC)
CK, MB: 1.8 ng/mL (ref 0.3–4.0)
Relative Index: INVALID (ref 0.0–2.5)

## 2011-03-03 LAB — BASIC METABOLIC PANEL
BUN: 5 mg/dL — ABNORMAL LOW (ref 6–23)
Calcium: 9.4 mg/dL (ref 8.4–10.5)
Creatinine, Ser: 0.67 mg/dL (ref 0.4–1.2)
GFR calc non Af Amer: >60 mL/min (ref >60)

## 2011-03-03 LAB — DIFFERENTIAL
Basophils Relative: 1 % (ref 0–1)
Eosinophils Absolute: 0.2 10*3/uL (ref 0.0–0.7)
Eosinophils Relative: 5 % (ref 0–5)
Neutrophils Relative %: 37 % — ABNORMAL LOW (ref 43–77)

## 2011-03-03 LAB — URINE MICROSCOPIC-ADD ON

## 2011-03-03 LAB — TROPONIN I: Troponin I: 0.02 ng/mL (ref 0.00–0.06)

## 2011-03-07 ENCOUNTER — Ambulatory Visit: Payer: MEDICARE | Admitting: Internal Medicine

## 2011-03-08 ENCOUNTER — Other Ambulatory Visit: Payer: Self-pay | Admitting: *Deleted

## 2011-03-08 NOTE — Telephone Encounter (Signed)
Patient called stating she is having stabbing pain, and she is wanting to know if she could get a Rx for Rx strength Tylenol. She was informed there were no providers in the office today. She was offered to schedule appointment with [facility] office and patient declined. She was offered to see if she could get an appointment at the [facility] office, patient refuses to seek care at that location. She states she is in a bad way and could not come out the house to be seen for evaluation. Patient was advised that if she is having as bad a headache as she states she was having, she should be seen in urgent care for evaluation. She states she may have to go that route, and in the mean time she says she will see how she does. No further action required at this time, as patient stated she will call back if needed or seek care in urgent care.

## 2011-03-12 ENCOUNTER — Telehealth: Payer: Self-pay | Admitting: *Deleted

## 2011-03-12 NOTE — Telephone Encounter (Signed)
I have not seen any consultant reports

## 2011-03-12 NOTE — Telephone Encounter (Signed)
Patient called and left voice message stating she had blood work done with Dr Stefano Gaul in [facility] for low blood, and wanted to know if Dr Artist Pais received the test results. She states she is also continues to have a lot of body aches and no energy.

## 2011-03-14 ENCOUNTER — Encounter: Payer: Self-pay | Admitting: Internal Medicine

## 2011-03-14 ENCOUNTER — Ambulatory Visit: Payer: MEDICARE | Admitting: Internal Medicine

## 2011-03-15 ENCOUNTER — Ambulatory Visit (INDEPENDENT_AMBULATORY_CARE_PROVIDER_SITE_OTHER): Payer: MEDICARE | Admitting: Internal Medicine

## 2011-03-15 ENCOUNTER — Ambulatory Visit: Payer: MEDICARE | Admitting: Internal Medicine

## 2011-03-15 ENCOUNTER — Encounter: Payer: Self-pay | Admitting: Internal Medicine

## 2011-03-15 VITALS — BP 138/80 | HR 79 | Temp 98.4°F | Resp 18 | Wt 154.0 lb

## 2011-03-15 DIAGNOSIS — J329 Chronic sinusitis, unspecified: Secondary | ICD-10-CM

## 2011-03-15 DIAGNOSIS — R519 Headache, unspecified: Secondary | ICD-10-CM | POA: Insufficient documentation

## 2011-03-15 DIAGNOSIS — IMO0001 Reserved for inherently not codable concepts without codable children: Secondary | ICD-10-CM

## 2011-03-15 DIAGNOSIS — R51 Headache: Secondary | ICD-10-CM

## 2011-03-15 DIAGNOSIS — F411 Generalized anxiety disorder: Secondary | ICD-10-CM

## 2011-03-15 HISTORY — DX: Headache: R51

## 2011-03-15 MED ORDER — MILNACIPRAN HCL 12.5 & 25 & 50 MG PO MISC: ORAL | Status: DC

## 2011-03-15 MED ORDER — TRAMADOL HCL 50 MG PO TABS: ORAL_TABLET | ORAL | Status: DC

## 2011-03-15 NOTE — Patient Instructions (Addendum)
Consider trigger point injections or massage therapy Do not use tramadol while you are taking Savella Our office will contact you re:  Neurology referral

## 2011-03-15 NOTE — Telephone Encounter (Signed)
Called Dr Peterson Ao (endocrinology) office [phone] and spoke with Cordelia Pen. She states the consultation report has already been sent to [place] but she will fax it now.

## 2011-03-23 ENCOUNTER — Telehealth: Payer: Self-pay | Admitting: Internal Medicine

## 2011-03-23 NOTE — Telephone Encounter (Signed)
Patient returned call and left voice message stating she wanted Dr Angela Wang to know that she has stopped taking the [facility] because it was causing her to have sweats, heart racing and upsetting her stomach.

## 2011-03-23 NOTE — Telephone Encounter (Signed)
SAVELLA IS MAKING HER NAUSEATED AND MAKES HER SWEAT AND MAKES HER HEART RACE.  [phone]

## 2011-03-23 NOTE — Telephone Encounter (Signed)
Noted  

## 2011-03-23 NOTE — Telephone Encounter (Signed)
Call returned to patient at [phone], no answer. A voice message was left for patient to return call regarding her symptoms

## 2011-04-01 NOTE — Assessment & Plan Note (Signed)
Pt c/o chronic neck and back pain.     Trial of savella. We discussed common side effects

## 2011-04-01 NOTE — Assessment & Plan Note (Signed)
No change in meds.

## 2011-04-01 NOTE — Assessment & Plan Note (Signed)
Pt seen by Dr. Annalee Genta.  She declines sinus surgery for now.   Pt continues to have sinus symptoms and headache.  Pt advised to use nasal saline irrigation. If symptoms worsen, I urged pt to reconsider sinus surgery.

## 2011-04-01 NOTE — Progress Notes (Signed)
Subjective:    Patient ID: Angela Wang, female    DOB: 1931/04/19, 77 y.o.   MRN: [id number]  HPI  77 y/o AA female for follow up.   Pt c/o chronic neck pains, intermittent headache and back pain. She was prev referred to ENT for chronic sinsuitis.  She declined surgery.  She insisted on using azithromycin.  Pt unable to tolerate any other abx  Pt accompanied by her daugther   Review of Systems No fever or chills    Past Medical History  Diagnosis Date  . Allergy     allergic rhinitis  . Anemia     nos  . Depression   . Diabetes mellitus     type II, diet controlled  . Hyperlipidemia   . Hypertension   . Arthritis     osteoarthritis  . Stroke   . History of solitary pulmonary nodule     right, stable by CT  . Diverticulosis   . Fibromyalgia   . IBS (irritable bowel syndrome)   . GERD (gastroesophageal reflux disease)   . Atypical chest pain 09/2008    negative myoview  . Leukopenia     chronic  . Chronic sinusitis   . Routine general medical examination at a health care facility     History   Social History  . Marital Status: Widowed    Spouse Name: N/A    Number of Children: 4  . Years of Education: N/A   Occupational History  . retired     tob[employer] company   Social History Main Topics  . Smoking status: Never Smoker   . Smokeless tobacco: Never Used  . Alcohol Use: No  . Drug Use: Not on file  . Sexually Active: Not on file   Other Topics Concern  . Not on file   Social History Narrative  . No narrative on file    Past Surgical History  Procedure Date  . Appendectomy   . Cholecystectomy   . Abdominal hysterectomy   . Joint replacement     total right knee replacement    Family History  Problem Relation Age of Onset  . Cancer Mother     pancreatic  . Coronary artery disease Father   . Cancer Sister      breast  . Asthma Sister   . Cancer Brother     prostate  . Allergies Brother   . Cancer Maternal Aunt     colon  . Diabetes  Other   . Cancer Other     ovarian    Allergies  Allergen Reactions  . Amitriptyline Hcl     REACTION: throat swelling  . Aspirin     REACTION: stomach upset  . Codeine     REACTION: stomach upset  . Escitalopram Oxalate     REACTION: Hallucinations  . Hydrocodone-Acetaminophen     REACTION: stomach upset  . Latex     REACTION: rash  . Lorazepam     REACTION: Throat Swelling  . Oxycodone-Acetaminophen     REACTION: Nausea and upset stomach  . Terfenadine     REACTION: stomach upset    Current Outpatient Prescriptions on File Prior to Visit  Medication Sig Dispense Refill  . ALPRAZolam (XANAX) 0.5 MG tablet Take 1 tablet (0.5 mg total) by mouth 3 (three) times daily as needed.  90 tablet  3  . B Complex Vitamins (B COMPLEX 100 PO) Take 1 tablet by mouth daily.        Marland Kitchen  Calcium Carbonate-Vitamin D (CALCIUM-VITAMIN D) 500-200 MG-UNIT per tablet Take 1 tablet by mouth 3 (three) times daily.        . clopidogrel (PLAVIX) 75 MG tablet Take 75 mg by mouth daily.        Marland Kitchen co-enzyme Q-10 30 MG capsule Take 30 mg by mouth 3 (three) times daily.        . furosemide (LASIX) 20 MG tablet Take 20 mg by mouth daily.        Marland Kitchen gabapentin (NEURONTIN) 300 MG capsule Take 300 mg by mouth 3 (three) times daily.        Marland Kitchen loteprednol (LOTEMAX) 0.5 % ophthalmic suspension Place 1 drop into the right eye 2 (two) times daily.        . Magnesium 500 MG TABS Take 1 tablet by mouth daily.        . Melatonin 1 MG TABS Take 1/2 tablet at 4pm and 8pm       . omeprazole-sodium bicarbonate (ZEGERID) 40-1100 MG per capsule Take 1 capsule by mouth daily.        Marland Kitchen HYDROmorphone (DILAUDID) 4 MG tablet Take 1/2 to 1 tablet by mouth daily as needed.         BP 138/80  Pulse 79  Temp(Src) 98.4 F (36.9 C) (Oral)  Resp 18  Wt 154 lb (69.854 kg)    Objective:   Physical Exam  Constitutional: She is oriented to person, place, and time. She appears well-developed and well-nourished.  Cardiovascular: Normal  rate, regular rhythm and normal heart sounds.   Pulmonary/Chest: Effort normal and breath sounds normal. She has no wheezes. She has no rales.  Neurological: She is oriented to person, place, and time.  Psychiatric: Her speech is normal. Thought content normal. She exhibits a depressed mood.          Assessment & Plan:

## 2011-04-02 ENCOUNTER — Other Ambulatory Visit (INDEPENDENT_AMBULATORY_CARE_PROVIDER_SITE_OTHER): Payer: Self-pay | Admitting: Otolaryngology

## 2011-04-02 DIAGNOSIS — M542 Cervicalgia: Secondary | ICD-10-CM

## 2011-04-04 ENCOUNTER — Telehealth: Payer: Self-pay | Admitting: Internal Medicine

## 2011-04-04 DIAGNOSIS — M545 Low back pain: Secondary | ICD-10-CM

## 2011-04-04 DIAGNOSIS — M542 Cervicalgia: Secondary | ICD-10-CM

## 2011-04-04 NOTE — Telephone Encounter (Signed)
tramadal is not working

## 2011-04-04 NOTE — Telephone Encounter (Signed)
If pt requires chronic narcotic medicaiton, I suggest referral to pain mgt See orders

## 2011-04-04 NOTE — Telephone Encounter (Signed)
She is having sharp pains in her head neck and ear and it is not sinus.  She would like an rx [facility]aud [facility]

## 2011-04-05 ENCOUNTER — Inpatient Hospital Stay (INDEPENDENT_AMBULATORY_CARE_PROVIDER_SITE_OTHER)
Admission: RE | Admit: 2011-04-05 | Discharge: 2011-04-05 | Disposition: A | Discharge status: Home / Self Care | Payer: MEDICARE | Source: Ambulatory Visit | Attending: Family Medicine | Admitting: Family Medicine

## 2011-04-05 DIAGNOSIS — M542 Cervicalgia: Secondary | ICD-10-CM

## 2011-04-05 DIAGNOSIS — J019 Acute sinusitis, unspecified: Secondary | ICD-10-CM

## 2011-04-05 LAB — POCT RAPID STREP A (OFFICE): Streptococcus, Group A Screen (Direct): NEGATIVE

## 2011-04-05 MED ORDER — HYDROMORPHONE HCL 4 MG PO TABS: ORAL_TABLET | ORAL | Status: DC

## 2011-04-05 NOTE — Telephone Encounter (Signed)
Call placed to patient at [phone], she was advised per Dr Artist Pais instructions. Patient wanted to know if Dr Artist Pais would provide a rx for pain until she is seen by pain management.   Patient was informed Dr Artist Pais has authorized one refill on Dilaudid, and the Rx would need to be pick up. She states she will have her daughter stop by and pick up the Rx. Rx left at front desk for patient pick up

## 2011-04-10 NOTE — Assessment & Plan Note (Signed)
[facility]                            CARDIOLOGY OFFICE NOTE   NAME:Angela Wang, Angela Wang                        MRN:          [id number]  DATE:09/08/2008                            DOB:          March 01, 1931    [name] is a 77 year old female who I am asked to evaluate for chest  pain.  She has had no prior cardiac history.  She did have a CVA in 2005  and also had coiling of the cerebral aneurysm at that time.  Since then  she has had intermittent chest pain.  It always occurs at night and  wakes her from sleep.  It is in the left chest area and radiates down  her left arm.  It is described as an aching sensation.  It is not  pleuritic or positional nor is it related to food.  There is no  associated nausea, vomiting, shortness of breath or diaphoresis.  When  she sits up the pain improves/resolves.  She has these episodes at  intermittent frequencies.  Because of the above, we were asked to  further evaluate.  Note, she denies any dyspnea on exertion, orthopnea,  PND, palpitations, presyncope, syncope or exertional chest pain.  She  occasionally has mild pedal edema, left greater than right.   PRESENT MEDICATIONS:  1. Lasix 20 mg p.o. daily.  2. Lexapro 10 mg p.o. daily.  3. Neurontin 300 mg p.o. b.i.d.  4. Plavix 75 mg p.o. daily.  5. Zegerid.  6. Calcium.  7. Magnesium.  8. Vitamin B1.  9. Astelin nasal spray.  10.She also takes alprazolam as needed.  11.Voltaren as needed.  12.Tramadol as needed.   ALLERGIES:  She has an allergy to ASPIRIN, CODEINE, KRISTALOSE,  METOCLOPRAMIDE, MIRALAX, TYLOX, REGLAN and VICODIN.   SOCIAL HISTORY:  She does not smoke nor does she consume alcohol.  She  is widowed.   FAMILY HISTORY:  Significant for father had a myocardial infarction in  his early 29s.   PAST MEDICAL HISTORY:  There is no diabetes mellitus, hypertension or  hyperlipidemia.  She does have a history of stroke and coiling of the  cerebral  aneurysm as described in the HPI.  She has had the prior ulcer  as well as gastroesophageal reflux disease.  She has had a prior  hysterectomy, appendectomy, cholecystectomy.   REVIEW OF SYSTEMS:  She denies any headaches, fevers or chills.  There  is no productive cough or hemoptysis.  There is no dysphagia,  odynophagia, melena or hematochezia.  There is no dysuria or hematuria.  No rash or seizure activity.  There is no orthopnea, PND or pedal edema.  There is no claudication noted.  The remaining systems are negative.   PHYSICAL EXAMINATION:  VITAL SIGNS:  Today shows a blood pressure of  140/83 and her pulse is 76.  She weighs 163 pounds.  GENERAL:  She is well-developed, well-nourished and in no acute  distress.  SKIN:  Warm and dry.  She does not to be depressed.  There is no  peripheral clubbing.  BACK:  Normal.  HEENT:  Normal with normal eyelids.  NECK:  Supple with normal upstroke bilaterally.  There are no bruits  noted.  There is no jugular distention and no thyromegaly noted.  CHEST:  Clear.  No expansion.  CARDIOVASCULAR:  Regular rate and rhythm.  Normal S1 and S2.  There are  no murmurs, rubs, or gallops noted.  ABDOMEN:  Nontender, distended.  Positive bowel sounds.  No  hepatosplenomegaly.  No masses appreciated.  There is no abdominal  bruit.  She has 2+ femoral pulses bilaterally.  No bruits.  EXTREMITIES:  No edema that I can palpate.  No cords.  She has 2+  dorsalis pedis pulses bilaterally.  NEUROLOGIC:  Grossly intact.   Electrocardiogram shows normal sinus rhythm at a rate of 71.  The axis  is normal.  There are no ST changes noted.   DIAGNOSES:  1. Atypical chest pain - The patient's symptoms are very atypical.      Her electrocardiogram is normal.  We will plan to proceed with an      adenosine Myoview.  If this shows no abnormalities, we will not      pursue further cardiac workup.  2. History of cerebrovascular accident/coiling of cerebral  aneurysm.  3. Gastroesophageal reflux disease - She will continue on her Zegerid.   We will see her back on as-needed basis pending the results of her  Myoview.     Madolyn Frieze Jens Som, MD, [facility]  Electronically Signed    BSC/MedQ  DD: 09/08/2008  DT: 09/09/2008  Job #: [id number]   cc:   Barbette Hair. Artist Pais, DO

## 2011-04-10 NOTE — H&P (Signed)
Angela Wang, Angela Wang                 ACCOUNT NO.:  [id number]   MEDICAL RECORD NO.:  [id number]          PATIENT TYPE:  INP   LOCATION:  1829                         FACILITY:  [facility]   PHYSICIAN:  Eduard Clos, MDDATE OF BIRTH:  05-08-1931   DATE OF ADMISSION:  07/21/2009  DATE OF DISCHARGE:                              HISTORY & PHYSICAL   PRIMARY CARE PHYSICIAN:  Dr. Barbette Hair. Artist Pais, of Adult nurse at [employer].   CHIEF COMPLAINT:  Right upper extremity and lower extremity weakness and  numbness.   HISTORY OF PRESENT ILLNESS:  This 77 year old female with a history of  cerebral aneurysm, status post coiling in 2005, a history of peptic  ulcer disease and reflux disease, presented with the complaint of right  upper extremity and right lower extremity weakness.  The patient stated  her symptoms started off at 1:00 p.m. yesterday, which is almost more  than 24 hours now.  The patient was unable to lift her right leg against  gravity, and along with that she also had some foot drop.  The patient  also had some clumsiness of the right upper extremity.  Also had  difficulty lifting her right arm against gravity.  The patient came  today to the emergency room.  At this time her symptoms have largely  improved, but still has some persistent numbness in the right upper  extremity with vague weakness in the right lower extremity.  Her foot  drop has almost completely resolved at this time.   The patient denies any visual symptoms.  Denies any loss of  consciousness.  Denies any difficulty speaking or difficulty swallowing.  The patient also denies any chest pain, shortness of breath, abdominal  pain, nausea, vomiting, diarrhea, fever or chills, dysuria, discharges.  The patient has been admitted for further observation and management,  for a possible CVA.   PAST MEDICAL HISTORY:  1. History of previous cerebral aneurysm with coiling in 2005.  2. Hypertension.  3. Anxiety disorder.  4.  Neuropathy.   PAST SURGICAL HISTORY:  1. A right knee replacement.  2. Post-cerebral aneurysm coiling in 2005.  3. Hysterectomy.  4. Cholecystectomy.   MEDICATIONS ON ADMISSION:  1. Zegerid p.o. daily.  2. Lasix 20 mg p.o. daily.  3. Calcium with vitamin D p.o. three times daily.  4. Co-Enzyme-Q  p.o. daily.  5. Plavix 75 mg p.o. daily.  6. Gabapentin 300 mg p.o. three times daily.  7. Astelin nasal spray, two sprays in each nostril b.i.d.  8. Xanax 0.5 mg three times a day as needed for anxiety.  9. Voltaren 1% gel,  apply three times daily p.r.n.  10.Vitamin B1 p.o. three times daily.  11.Magnesium one tablet daily.  12.Tramadol 50 mg p.o. q.6h. p.r.n. for pain.  13.Amlodipine 2.5 mg p.o. daily.   ALLERGIES:  ASPIRIN, CODEINE, LATEX, CYCLOBENZAPRINE.   FAMILY HISTORY:  Noncontributory.   SOCIAL HISTORY:  The patient lives alone.  Denies smoking cigarettes,  drinking alcohol use or illegal drugs.   REVIEW OF SYSTEMS:  Per history of present illness.  Nothing else  significant.   PHYSICAL EXAMINATION:  GENERAL:  The patient examined at bedside, not in  acute distress.  VITAL SIGNS:  Blood pressure is 164/80, pulse 60 per minute, temperature  97.5, respirations 18, O2 sat 100%.  HEENT:  Anicteric.  No pallor.  No facial asymmetry.  Tongue is midline.  NECK:  No rigidity.  LUNGS:  Bilateral air entry present.  No rhonchi, no crepitation.  HEART:  S1 and S2 heard.  ABDOMEN:  Soft, nontender.  Bowel sounds heard.  CNS:  The patient is alert, awake and oriented to time, place and  person.  Moves upper and lower extremities 5/5.  There is no  dysdiadochokinesia or no ataxia.  The patient did have some limping  yesterday, which she has resolved now.  EXTREMITIES:  Peripheral pulses felt.  No edema.   LABORATORY DATA:  ELECTROCARDIOGRAM:  Normal sinus rhythm, with no acute  ST-T changes.   CT of the head without contrast shows no acute intracranial findings,  chronic  microvascular ischemia, coiling of the right MCA aneurysm, acute  and chronic sinusitis.  Chest x-ray shows no acute cardiopulmonary  disease, borderline cardiomegaly without congestive failure.  Old  granulomatous  disease.   CBC:  WBC 4.20, hemoglobin 12, hematocrit 37.2, platelets 175,  neutrophils 37%.  PT/INR 13.5 and 1 respectively.  Basic metabolic  panel:  Sodium 139, potassium 3.8, chloride 102, carbon dioxide 26,  glucose 82, BUN 5, creatinine 0.6, calcium 9.4, creatinine kinase 68.  Troponin-I 0.02.  UA is negative for ketones, blood, nitrites, moderate  leukocytes, WBCs 11 to 20.   ASSESSMENT:  1. Possible cerebrovascular accident, given the persistent nature of      her symptoms.  2. Hypertension.  3. History of cerebral aneurysm, status post coiling.  4. Possible urinary tract infection.  5. Acute and chronic sinusitis.   PLAN:  1. Will admit the patient to telemetry.  2. Will place the patient on neuro checks and cycle cardiac markers.  3. Will get a 2-D echo, carotid Doppler, transcranial Doppler and      MRI/MRA of the brain.  4. Wave get urine cultures and place on empiric antibiotics at this      time for possible urinary tract infection.  Order urine cultures.  5. Further recommendations will be based on her above tests and as the      condition evolves.      Eduard Clos, MD  Electronically Signed     ANK/MEDQ  D:  07/21/2009  T:  07/21/2009  Job:  [id number]   cc:   Barbette Hair. Artist Pais, DO

## 2011-04-10 NOTE — Assessment & Plan Note (Signed)
[facility]                         GASTROENTEROLOGY OFFICE NOTE   NAME:Angela Wang, Angela Wang                        MRN:          [id number]  DATE:05/09/2007                            DOB:          1931/07/12    CHIEF COMPLAINT:  Problem swallowing.   HISTORY:  Angela Wang is a 77 year old African-American who was previously  followed by Dr. Jarold Motto (requested to change to me).  Angela Wang has a long  history of irritable bowel, reflux problems.  Angela Wang is actually  complaining of regurgitation problems several times a week, but no real  dysphagia at this time.  Angela Wang has chronic constipation and bloating and  feels full, particularly on the right upper quadrant, and there is a  bulge there.  Angela Wang has not been losing weight or anything like that that  I am aware of.  Angela Wang had full workup multiple times before with Dr.  Jarold Motto.  Angela Wang is not having any rectal bleeding.  There is no vomiting  but there is some nausea.   MEDICATIONS:  Listed and reviewed in the chart.   PAST MEDICAL HISTORY:  1. Irritable bowel syndrome.  Some suspected possible pancreatic      exocrine insufficiency (mild), and bacterial overgrowth.  2. Hiatal hernia and gastroesophageal reflux disease, last upper      endoscopy 2006.  3. Colonoscopy, May 2006, diverticulosis.  4. Degenerative joint disease.  5. Anxiety and depression.  6. Stroke with right hemiparesis.  7. Right knee replacement.  8. Chronic anemia, stable.  9. Osteoarthritis.  10.Dyslipidemia.  11.Hypertension.  12.Diabetes mellitus.  13.Fibromyalgia.  14.Leukopenia, chronic, evaluated by hematology.  15.Prior cholecystectomy.  16.Prior appendectomy.  17.Prior hysterectomy and bilateral salpingo-oophorectomy.  18.Allergies.   PHYSICAL EXAMINATION:  Well developed, well nourished.  Weight 159 pounds, blood pressure 108/62.  EYES:  Anicteric.  SKIN:  Warm and dry, no acute rash.  Angela Wang is alert and oriented x3.  ABDOMEN:   Soft, nontender, multiple surgical scars.  There is a slight  bulge in the right upper quadrant, there is no real hernia.  The abdomen  is benign without organomegaly or mass.  LOWER EXTREMITIES:  Free of edema.   ASSESSMENT:  1. Gastroesophageal reflux disease with breakthrough symptoms despite      Aciphex daily.  2. Irritable bowel syndrome, mainly manifested by constipation.  The      bloating and gassiness that Angela Wang has is relieved with constipation,      and sometimes associated crampy abdominal pain.  Angela Wang says Angela Wang has      tried Clinical biochemist and multiple laxatives including stimulant laxatives,      and despite this Angela Wang only moves her bowels 2-3 times a day.  Angela Wang      says Tessie Fass is the best thing for her so far and Angela Wang is on that.   RECOMMENDATIONS AND PLAN:  1. Add Aciphex 20 mg before supper for a month.  2. Align 1 each day for a month.  3. Add MiraLax to her fiber, maybe that will help her have better      defecation pattern.  Followup p.r.n.  Angela Wang may need to make some of these medication changes  chronically, but we will see what a month's worth of therapy does for  her and how long that lasts.  Angela Wang knows to follow up as needed.   I do not think any endoscopy studies are indicated at this time.     Iva Boop, MD,FACG  Electronically Signed    CEG/MedQ  DD: 05/09/2007  DT: 05/09/2007  Job #: [id number]   cc:   Barbette Hair. Artist Pais, DO

## 2011-04-11 ENCOUNTER — Telehealth: Payer: Self-pay | Admitting: Internal Medicine

## 2011-04-11 NOTE — Telephone Encounter (Signed)
Pt wants an rx for meclizine 25mg  for her dizziness?

## 2011-04-12 NOTE — Telephone Encounter (Signed)
Pt needs OV 

## 2011-04-13 NOTE — Discharge Summary (Signed)
NAME:  Angela Wang, Angela Wang                 ACCOUNT NO.:  [id number]   MEDICAL RECORD NO.:  [id number]          PATIENT TYPE:  INP   LOCATION:  5509                         FACILITY:  [facility]   PHYSICIAN:  Pramod P. Pearlean Brownie, MD    DATE OF BIRTH:  Mar 28, 1931   DATE OF ADMISSION:  09/21/2004  DATE OF DISCHARGE:  09/27/2004                                 DISCHARGE SUMMARY   DISCHARGE DIAGNOSES:  1.  Old left subcortical infarct.  2.  Diabetes.  3.  Hypertension.  4.  Fibromyalgia.  5.  History of bronchitis.  6.  Gastroesophageal reflux disease.  7.  Irritable bowel syndrome.  8.  Osteoarthritis.  9.  Anxiety/depression.  10. Hyperlipidemia.   DISCHARGE MEDICATIONS:  1.  Neurontin 300 mg t.i.d.  2.  Lasix 20 mg daily.  3.  Plavix 75 mg daily.  4.  Protonix 80 mg daily.  5.  Zelnorm 6 mg daily.  6.  Claritin 10 mg daily.  7.  Xanax 0.5 mg q.h.s.  8.  Cipro 500 mg b.i.d., started 09/26/04, give x3 days then discontinue.   STUDIES PERFORMED:  1.  MRI of the brain shows no acute infarct.  Recent right middle cerebral      aneurysm.  Scattered tiny small areas of ultra signal intensity probably      related to small infarct ischemia that are old.  Mild paranasal sinus      disease.  2.  MRA of the brain shows mild intracranial atherosclerotic-type changes.      No discrete aneurysm noted.  3.  Renal ultrasound shows probable scarring in the right kidney, otherwise      unremarkable ultrasound.   LABORATORY DATA:  Hemoglobin A1c 5.8.  UA on 09/21/04 was normal but repeat  on 09/26/04 showed white blood cells too numerous to count, red blood cells  too numerous to count, many bacteria and positive nitrate.  Urine culture  pending at time of discharge.  CBC with white blood cell count 5.5, red  blood cells 3.97, hemoglobin 10.8, hematocrit 33.0, MCV 82.9, MCH 332.6, RDW  14.5, platelets 313,000.  Coagulation studies normal on admission.  Chemistries normal.  Liver panel normal.  Urine culture  from 09/21/04 showed  5000 colonies for insignificant growth.   HISTORY OF PRESENT ILLNESS:  Angela Wang is a 77 year old right handed, black  female who had a left hemispheric infarction in May 2005.  She has some  residual right-sided weakness and paresthesias from that.  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  She underwent a successful  endovascular coiling on 09/07/04 by Dr. Eliezer Mccoy.  Since that time,  the patient has been complaining of worsening of right-sided deficits with  increased weakness and paresthesias as well as difficulty doing her  activities of daily living, like preparing meals, taking care of herself,  etc.  She lives alone.  Her daughter is a Financial controller and she is in and  out of the home.  The  patient requested consequently evaluation and  consideration for nursing home placement or rehab until she improves.  She  was admitted to the hospital for further evaluation.   HOSPITAL COURSE:  MRI did not reveal new acute infarct.  Right weakness and  hemiparesis are only subjectively worse.  She has been followed at home by  home health therapies.  Therapy evaluated her and did recommend some  outpatient follow-up therapy, however, the patient reluctant to go home  alone and requested nursing home placement.  The case manager became  involved and nursing home placement was obtained.   The patient did complain of some right flank and abdominal pain in the  hospital.  UA On 09/21/04 was normal but repeat on 09/26/04 showed positive  UTI for which she was started on Cipro for 3 days.  Urine culture is pending  at the time of discharge.  Renal ultrasound was done with a history of  kidney stones and that was normal.   CONDITION ON DISCHARGE:  The patient is alert and oriented x3.  Speech  clear, no aphasia.  Chest is clear to auscultation.  Heart rate and rhythm  regular.   Her abdomen is soft and non-distended.  She does have some mild  right flank tenderness but decreased since prior.  She has no arm drift.  She does have some decreased right shoulder movement secondary to right  shoulder adhesions and capsulitis.  Her gait is steady.  Her strength is  essentially normal.   DISCHARGE PLANS:  1.  Transfer to [facility] for continuation of physical      therapy and occupational therapy until the patient able to return home      alone.  2.  Cipro for urinary tract infection.  Follow up culture results.  3.  Plavix secondary to stroke prevention.  4.  Followup Dr. Alroy Dust with any medical issues.       SB/MEDQ  D:  09/27/2004  T:  09/27/2004  Job:  [id number]   cc:   Lonzo Cloud. Kriste Basque, M.D. [facility]   Pramod P. Pearlean Brownie, MD  Fax: [phone]

## 2011-04-13 NOTE — H&P (Signed)
NAME:  Angela Wang, Angela Wang                           ACCOUNT NO.:  [id number]   MEDICAL RECORD NO.:  [id number]                   PATIENT TYPE:  INP   LOCATION:  4739                                 FACILITY:  [facility]   PHYSICIAN:  Pramod P. Pearlean Brownie, MD                 DATE OF BIRTH:  [birth year]   DATE OF ADMISSION:  04/13/2004  DATE OF DISCHARGE:  04/17/2004                                HISTORY & PHYSICAL   DISCHARGE DIAGNOSES:  1. 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  2. 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  3. Fibromyalgia.  4. Chronic pain syndrome.  5. Gastroesophageal reflux.  6. Bronchitis.  7. Irritable bowel syndrome.  8. Osteoarthritis.  9. Anxiety.  10.      Depression.   MEDICINES AT TIME OF DISCHARGE:  1. Premarin 1.25 mg every day.  2. Xanax 0.5 mg t.i.d.  3. Zelnorm 6 mg b.i.d. before meals.  4. Lasix 40 mg every day.  5. Plavix 75 mg every day.  6. Neurontin 100 mg t.i.d.  7. Zyrtec-D b.i.d.  8. Nexium 40 mg every day.   LABORATORY STUDIES:  Cholesterol 198, triglycerides 67, HDL 103, LDL 82.  Hemoglobin A1c 5.7 and homocystine 11.42.  CBC with hemoglobin 11.3,  hematocrit 35.5, white blood cells 4.8, and platelets 205.  Coagulation  studies were normal on admission.  Chemistry was normal.  Liver function  tests were normal.   STUDIES PERFORMED:  1. CT of the brain.  Negative for acute abnormality.  Right maxillary sinus     disease.  2. MRI of the brain shows an acute ischemic infarct at the junction of the     posterior rim of the left internal capsule and left thalamus.  Aneurism     right middle cerebral artery.  Mild small vessel disease type changes.     Paranasal sinus partial opacification, right greater than left.     Thrombosed cortical vein in the anterior right frontal __________ region.  3. MRA of the brain shows significant  atherosclerotic type changes.     Suspicious 4 x 4.5-mm right MCA aneurysm, angiogram recommended.  4. EKG shows a normal sinus rhythm with no old tracing to compare.  5. Carotid Doppler is normal.  6. A 2D echocardiogram was unable to evaluate left ventricular wall     function, although ejection fraction of the left atrium appeared normal.     Study was read by Dr. Myrtis Ser.  7. Transcranial Doppler.  Not completed.   HISTORY OF PRESENT ILLNESS:  Angela Wang is a 77 year old, right handed,  white female who had sudden onset of right facial paresthesias, slurred  speech, and difficulty swallowing between 12:30 and 1 p.m. on the day of  admission, while she was finishing  work.  She called her physician's office  and was seen immediately by the P.A. and was noted to have right sided  weakness and sensory loss.  Dr. Pearlean Brownie was contacted and advised the patient  to be seen in the emergency room.  She was evaluated around 5 p.m.  At that  time, she denied any right sided weakness but did note significant  paresthesias effecting the right face and body.  She admitted to having some  speech difficulty and swallowing trouble - pills.  She had no prior history  of stroke, TIA, or other significant complaint.  She does have a history of  fibromyalgia and chronic pain syndrome.  She was admitted for further stroke  evaluation.  The patient was not a TPA or Saint candidate secondary to time  and mildness of symptoms.   HOSPITAL COURSE:  MRI did reveal an acute infarction in the left posterior  (internal capsule).  She was placed on Aggrenox initially.  She has a  history of intolerance to aspirin so was removed from this and placed on  Plavix 75 every day for secondary stroke prevention.  Other risk factors  including homocystine and lipids were negative.  Patient without obvious  weakness but has a history of right knee replacement.  Ambulation was  limited over the weekend.  PT did see the  patient on Monday, and the patient  was able to walk 250 feet with supervision and plans were made to discharge  her home.  Per the patient's request, we will continue PT and OT at home to  increase strength after deconditioning over the weekend.   CONDITION ON DISCHARGE:  The patient was alert and oriented  x 3.  No  aphasia or dysarthria.  No extremity weakness, but right hand moves  deliberately and slow.   DISCHARGE PLAN:  1. Discharge the patient home.  2. Plavix for secondary stroke prevention.  3. Home health PT and OT for strengthening.  4. Followup with Dr. Janalyn Shy P. Sethi in two months.  5. Followup with Dr. Lonzo Cloud. Nadel in one month.      Annie Main, N.P.                         Pramod P. Pearlean Brownie, MD    SB/MEDQ  D:  04/17/2004  T:  04/18/2004  Job:  [id number]   cc:   Pramod P. Pearlean Brownie, MD  Fax: [phone]   Lonzo Cloud. Kriste Basque, M.D. [facility]

## 2011-04-13 NOTE — H&P (Signed)
NAME:  Angela Wang, Angela Wang                           ACCOUNT NO.:  [id number]   MEDICAL RECORD NO.:  [id number]                   PATIENT TYPE:  INP   LOCATION:  4739                                 FACILITY:  [facility]   PHYSICIAN:  Pramod P. Pearlean Brownie, MD                 DATE OF BIRTH:  07-23-1931   DATE OF ADMISSION:  04/13/2004  DATE OF DISCHARGE:                                HISTORY & PHYSICAL   REFERRING PHYSICIAN:  Lonzo Cloud. Kriste Basque, M.D. [facility]   REASON FOR REFERRAL:  Stroke.   HISTORY OF PRESENT ILLNESS:  Angela Wang is a 77 year old lady who developed  sudden onset of right facial paresthesia, slurred speech, and some  difficulty swallowing at sometime between 12:30 and 1:00 p.m. today while  she was finishing work.  The patient called her physician's office and was  seen immediately by the P.A. and noted to have right-sided weakness and  sensory loss.  I was called on the phone and advised the patient to be seen  in the emergency room.  I evaluated her at 5:00 p.m.  The patient denied any  significant right-sided weakness, but noted significant paresthesia  affecting the right face as well as the body.  She admitted to having some  speech difficulty and some swallowing trouble.  She denied any gait or  balance difficulty, vertigo, headache, blurred vision or double vision.  She  has no prior history of stroke, TIA, or significant complaints.   PAST MEDICAL HISTORY:  Significant for fibromyalgia, bronchitis,  gastroesophageal reflux disease, irritable bowel syndrome, osteoarthritis,  anxiety, and depression.   PAST SURGICAL HISTORY:  Right knee replacement.   ALLERGIES:  ASPIRIN, CODEINE.   MEDICATIONS:  1. Nexium.  2. Premarin.  3. Xanax.  4. Zelnorm.  5. Zyrtec.  6. Percocet.  7. Ultram.   REVIEW OF SYSTEMS:  Significant for recent knee and right back pain.  She  had a fall three months ago. She was worked up for this by Coletta Memos,  M.D. with a negative examination.  She, in  fact saw orthopedic surgeon for  this.  She denies any chest pain, shortness of breath, or cough.  She  complaints of some difficulty swallowing and slurred speech.   PHYSICAL EXAMINATION:  GENERAL:  Anxious, elderly lady in no acute distress.  VITAL SIGNS:  Pulse 64, regular, sinus. Blood pressure 140/60, respiratory  rate 18 per minute, saturation 98% on 2 liters of oxygen. Distal pulses are  well felt.  HEENT:  Atraumatic.  ENT unremarkable.  NECK:  Supple without bruit.  HEART:  No murmur or gallop.  LUNGS:  Clear to auscultation.  ABDOMEN:  Soft and nontender.  NEUROLOGY:  The patient is awake, alert, oriented x3, with normal speech and  language function. She appears quite anxious.  There does appear to be  element of functional to her symptoms.  There is no  clear dysarthria,  although, her speech is slow and deliberate.  There is no aphasia.  She has  intact attention __________ recall.  Eye movements are full range without  nystagmus.  Visual field full to confrontational testing.  Face is symmetric  and bilaterally works normally.  Tongue is midline.  Her cough appears to be  weak, but I doubt her effort.  Palatal movements are symmetric.  There is no  upper extremity drift.  Finger tapping is symmetric bilaterally.  She does  not orbit left or right upper extremity.  Deep tendon reflexes are  symmetric. Plantars are downgoing.  She complains of subjective decreased  touch and pinprick on the whole right side of the body including the upper  and lower half of the face.  She appears to split in the midline.   LABORATORY DATA:  Admission labs and CT scan of the head are pending.   IMPRESSION:  A 77 year old lady with sudden onset of right hemibody sensory  loss, speech and swallowing difficulty.  Possibly small brainstem stroke,  however, there does appear to be a functional element to her symptoms.   PLAN:  The patient was admitted for stroke workup since she is in the   appropriate age range, though, she does not have any other risk factors.  MRI scan of the brain with MRA of the brain, neck, and carotid arteries and  transcranial Doppler studies, cardiac echo.  Stroke risk stratification  labs.  Plavix for secondary stroke prevention as she is allergic to aspirin.   The patient complains of chronic right thigh and knee pain.  She will be  treated for this by her family physician, Dr. Alroy Dust, who was also  consulted.                                                Pramod P. Pearlean Brownie, MD    PPS/MEDQ  D:  04/13/2004  T:  04/14/2004  Job:  [id number]   cc:   Lonzo Cloud. Kriste Basque, M.D. [facility]

## 2011-04-13 NOTE — Consult Note (Signed)
NAME:  Angela Wang, Angela Wang                 ACCOUNT NO.:  [id number]   MEDICAL RECORD NO.:  [id number]          PATIENT TYPE:  AMB   LOCATION:  SDS                          FACILITY:  [facility]   PHYSICIAN:  Sanjeev K. Deveshwar, M.D.DATE OF BIRTH:  [birth year]   DATE OF CONSULTATION:  DATE OF DISCHARGE:                                   CONSULTATION   CHIEF COMPLAINT:  The patient is here for cerebral angiogram today.   HISTORY OF PRESENT ILLNESS:  This is a 77 year old female with a history of  a right middle cerebral artery aneurysm that was coiled by Dr. Corliss Skains  September 07, 2004.  The patient has been doing well since that time. She is  here, today, for a repeat angiogram to further evaluate her cerebrovascular  disease.   PAST MEDICAL HISTORY:  The patient had a left subcortical CVA in May of 2005  She has never mild residual right-sided weakness.  She has a history of  diabetes mellitus, hyperlipidemia, hypertension, gastroesophageal reflux  disease, fibromyalgia, irritable bowel syndrome, degenerative joint disease,  and anxiety and depression.   SURGICAL HISTORY:  The patient is status post right total knee replacement.  She is status post hysterectomy.  She has had surgery for gallstones.   ALLERGIES:  The patient is allergic to aspirin which causes swelling. She  states that she is also allergic to CODEINE, PENICILLIN, and LATEX.   CURRENT MEDICATIONS:  Include Plavix, Lasix, Neurontin, Lexapro, Zyrtec and  Xanax.   SOCIAL HISTORY:  The patient is widowed.  She has four children, she lives  alone in [place].  She does not use alcohol or tobacco.   FAMILY HISTORY:  Her mother died in her 90s from cancer.  Her father died in  his 70s from an MI.   LABORATORY DATA:  An INR is 1.0.  PTT is 28. CBC reveals hemoglobin 11,  hematocrit 39.1, WBC is 4300, platelets 218,000.  BUN 9, creatinine 0.8.  The remainder of the chemistry profile was normal.   PHYSICAL EXAMINATION:   GENERAL:  Reveals a pleasant, 77 year old African-  American female in no acute distress.  VITAL SIGNS:  Blood pressure 125/80, pulse 80, respirations at 16,  temperature 97.6.  O2 saturation 98%.  HEENT:  Unremarkable.  NECK:  Reveals no bruits no jugular venous distention.  HEART:  Reveals a regular rate and rhythm with a question of a soft systolic  murmur, however, this was a noisy room.  LUNGS:  Clear.  ABDOMEN:  Soft, nontender.  EXTREMITIES:  Revealed pulses to be weak, but intact.  There is no  significant edema.  NEUROLOGICAL EXAM:  Mental status the patient is alert and oriented and  follows commands.  Cranial nerves II-XII are grossly intact.  Sensation is  slightly diminished on the right.  Motor strength is 4/2 on the right and  5/5 on the left.  Cerebellar testing was performed with slight difficulty on  the right.   PLAN:  As noted, the patient will undergo cerebral angiogram today to  further evaluate the previously coiled aneurysm.  Delton See, P.A.    ______________________________  Grandville Silos. Corliss Skains, M.D.    DR/MEDQ  D:  12/10/2005  T:  12/10/2005  Job:  [id number]   cc:   Pramod P. Pearlean Brownie, MD  Fax: [phone]   Thomos Lemons, D.O. [facility]  [address] [place], [place] [address]

## 2011-04-13 NOTE — H&P (Signed)
NAMEKHANH, CORDNER                 ACCOUNT NO.:  [id number]   MEDICAL RECORD NO.:  [id number]          PATIENT TYPE:  INP   LOCATION:                               FACILITY:  [facility]   PHYSICIAN:  Pramod P. Pearlean Brownie, MD    DATE OF BIRTH:  [birth year]   DATE OF ADMISSION:  09/21/2004  DATE OF DISCHARGE:                                HISTORY & PHYSICAL   REASON FOR ADMISSION:  Worsening of old neurologic deficit.   PRIMARY CARE PHYSICIAN:  Lonzo Cloud. Kriste Basque, M.D. [facility]   HISTORY OF PRESENT ILLNESS:  Ms. Augenstein is a 77 year old lady who is known to  me from left hemispheric infarction in May 2005. She had residual right-  sided weakness and paresthesias from that. At that time on evaluation she  was also found to have incidental 5 x 3.8 mm right middle cerebral artery  bifurcation aneurysm. She underwent successful endovascular coiling of this  on October 13th this year by Dr. Corliss Skains. Since then the patient has been  complaining of worsening of right-sided deficits with increased weakness and  paresthesias as well as difficulty with doing her activities of daily living  like preparing meals, taking care of herself, grooming, bathing, and  dressing herself. She lives alone and family members to assist her, but not  more than once or twice a week. The patient called requesting to be  evaluated and possibly considered for placement in a nursing home or rehab  until she gets better. She denies any new headaches following the  endovascular coiling procedure or specifically any new left-sided symptoms  with weakness and numbness. Her past neurological history, as stated  earlier, significant for small subcortical left hemispheric infarction in  May 2005 thought to be related to small vessel disease. Vascular risk  factors include hypertension, diabetes, and hyperlipidemia.   PAST MEDICAL HISTORY:  1.  Fibromyalgia.  2.  Bronchitis.  3.  Gastroesophageal reflux disease.  4.  Irritable bowel  syndrome.  5.  Osteoarthritis.  6.  Anxiety/depression.  7.  Hypertension.  8.  Diabetes.  9.  Hyperlipidemia.   PAST SURGICAL HISTORY:  Right knee replacement.   MEDICATION ALLERGIES:  ASPIRIN and CODEINE.   CURRENT MEDICATIONS:  Zyrtec, Flomax, Percocet, Neurontin, Lexapro, and  Plavix.   FAMILY HISTORY:  Not significant for family members with aneurysms or  neurologic problems.   REVIEW OF SYSTEMS:  Significant for recent weakness, numbness, neck pain,  chest pain on the right side, and difficulty walking.   PHYSICAL EXAMINATION:  GENERAL: Anxious, elderly lady who is in not in  distress.  VITAL SIGNS: She is afebrile with pulse rate of 88 per minute and regular,  respirations 16 per minute. Distal pulses well felt.  HEENT: Head is normocephalic.  NECK: Supple without bruits. The patient is wearing a cervical collar  secondary to her recent procedure.  EXTREMITIES: She has a right foot brace.  CARDIAC: Regular heart sounds.  LUNGS: Clear to auscultation bilaterally.  ABDOMEN: Soft, nontender.  NEUROLOGIC: The patient is awake, alert, and oriented times three. There is  no aphasia, apraxia, or dysarthria. Pupils are equal, reactive to light and  accommodation. Visual acuity and fields are adequate. Fields are symmetric  with minimum asymmetry of the right lower half of the face when she smiles.  Palatal and tongue movements are normal. Motor system exam reveals minimum  right upper extremity strength drift. She has mild weakness of the intrinsic  hand muscles on the right with weak right grip, but there is poor effort.  _________left over right upper extremity. Tone is increased on the right  side, more so in the leg than the arm. She has weakness of the right foot,  mainly ankle dorsal flexors. She is wearing an AFO. She has right knee pain.  She has 4+/5 strength in the right lower extremity except for ankle dorsal  flexors, which are weaker.  She has subjective decrease  in touch and  pinprick sensation in the right face, upper and lower extremities. She walks  with unsteady gait with dragging of the right foot, with the right foot  being stiff. She has a four-prong cane to walk.   DATA REVIEWED:  As stated earlier, recent endovascular coiling of the right  MCA bifurcation aneurysm by Dr. Corliss Skains on September 07, 2004.  Previous  stroke workup as well as my office visits were reviewed.   IMPRESSION:  A 77 year old lady with worsening of preexisting right  hemiparesis following endovascular coiling for right middle cerebral artery  aneurysm 12 days ago. I doubt this is a complication of aneurysm as her  symptoms are not on the appropriate side.  Perhaps she may have some  deconditioning or another small left hemispheric infarction.   PLAN:  The patient is being admitted to [facility] for further  evaluation. Will check admission panel labs, CBC, UA, and check MRI scan of  the brain. Will discuss with Dr. Corliss Skains the safety of doing an MRI so  soon after endovascular coiling. The patient would benefit with referral to  physical and occupational therapy and perhaps rehab consult.  If she does  not improve significantly and still is unable to care for herself, may need  to consider nursing home placement. We discussed this with her primary care  physician, Dr. Alroy Dust, and will seek his advice for managing her  medical problems.        ___________________________________________  Sunny Schlein. Pearlean Brownie, MD    PPS/MEDQ  D:  09/21/2004  T:  09/21/2004  Job:  [id number]   cc:   Lonzo Cloud. Kriste Basque, M.D. [facility]

## 2011-04-13 NOTE — Telephone Encounter (Signed)
Call placed to patient at [phone], she was advised per Dr Artist Pais instructions. Patient stated the dizziness lasted for 2 days and has gone away.Nothing further needed

## 2011-04-19 ENCOUNTER — Ambulatory Visit: Payer: MEDICARE | Admitting: Internal Medicine

## 2011-08-15 LAB — CBC
Platelets: 166
WBC: 3.9 — ABNORMAL LOW

## 2011-08-15 LAB — BASIC METABOLIC PANEL
BUN: 6
Calcium: 9.2
Creatinine, Ser: 0.67
GFR calc non Af Amer: >60
Potassium: 3.9

## 2011-08-15 LAB — PROTIME-INR
INR: 0.9
Prothrombin Time: 12.6

## 2011-08-24 LAB — GLUCOSE, CAPILLARY: Glucose-Capillary: 85

## 2012-02-08 DIAGNOSIS — H35359 Cystoid macular degeneration, unspecified eye: Secondary | ICD-10-CM | POA: Insufficient documentation

## 2012-02-08 DIAGNOSIS — H3530 Unspecified macular degeneration: Secondary | ICD-10-CM | POA: Insufficient documentation

## 2012-02-08 DIAGNOSIS — H348392 Tributary (branch) retinal vein occlusion, unspecified eye, stable: Secondary | ICD-10-CM | POA: Insufficient documentation

## 2012-05-19 DIAGNOSIS — H26493 Other secondary cataract, bilateral: Secondary | ICD-10-CM | POA: Insufficient documentation

## 2012-05-20 ENCOUNTER — Ambulatory Visit (INDEPENDENT_AMBULATORY_CARE_PROVIDER_SITE_OTHER): Payer: Medicare Other | Admitting: Ophthalmology

## 2012-08-08 ENCOUNTER — Other Ambulatory Visit: Payer: Self-pay | Admitting: Family Medicine

## 2012-08-08 DIAGNOSIS — Z1231 Encounter for screening mammogram for malignant neoplasm of breast: Secondary | ICD-10-CM

## 2012-09-03 ENCOUNTER — Ambulatory Visit
Admission: RE | Admit: 2012-09-03 | Discharge: 2012-09-03 | Disposition: A | Discharge status: Home / Self Care | Payer: Medicare Other | Source: Ambulatory Visit | Attending: Family Medicine | Admitting: Family Medicine

## 2012-09-03 DIAGNOSIS — Z1231 Encounter for screening mammogram for malignant neoplasm of breast: Secondary | ICD-10-CM

## 2013-01-25 ENCOUNTER — Emergency Department (INDEPENDENT_AMBULATORY_CARE_PROVIDER_SITE_OTHER)
Admission: EM | Admit: 2013-01-25 | Discharge: 2013-01-25 | Disposition: A | Discharge status: Nursing Home (Includes ICF) | Payer: Medicare Other | Source: Home / Self Care | Attending: Emergency Medicine | Admitting: Emergency Medicine

## 2013-01-25 ENCOUNTER — Encounter (HOSPITAL_COMMUNITY): Payer: Self-pay | Admitting: *Deleted

## 2013-01-25 ENCOUNTER — Emergency Department (HOSPITAL_COMMUNITY)
Admission: EM | Admit: 2013-01-25 | Discharge: 2013-01-25 | Disposition: A | Discharge status: Home / Self Care | Payer: Medicare Other | Attending: Emergency Medicine | Admitting: Emergency Medicine

## 2013-01-25 ENCOUNTER — Encounter (HOSPITAL_COMMUNITY): Payer: Self-pay | Admitting: Physical Medicine and Rehabilitation

## 2013-01-25 DIAGNOSIS — F3289 Other specified depressive episodes: Secondary | ICD-10-CM | POA: Insufficient documentation

## 2013-01-25 DIAGNOSIS — E785 Hyperlipidemia, unspecified: Secondary | ICD-10-CM | POA: Insufficient documentation

## 2013-01-25 DIAGNOSIS — I10 Essential (primary) hypertension: Secondary | ICD-10-CM | POA: Insufficient documentation

## 2013-01-25 DIAGNOSIS — S80862A Insect bite (nonvenomous), left lower leg, initial encounter: Secondary | ICD-10-CM

## 2013-01-25 DIAGNOSIS — Z862 Personal history of diseases of the blood and blood-forming organs and certain disorders involving the immune mechanism: Secondary | ICD-10-CM | POA: Insufficient documentation

## 2013-01-25 DIAGNOSIS — R5381 Other malaise: Secondary | ICD-10-CM

## 2013-01-25 DIAGNOSIS — M7989 Other specified soft tissue disorders: Secondary | ICD-10-CM

## 2013-01-25 DIAGNOSIS — Z7902 Long term (current) use of antithrombotics/antiplatelets: Secondary | ICD-10-CM | POA: Insufficient documentation

## 2013-01-25 DIAGNOSIS — Z8709 Personal history of other diseases of the respiratory system: Secondary | ICD-10-CM | POA: Insufficient documentation

## 2013-01-25 DIAGNOSIS — Y9229 Other specified public building as the place of occurrence of the external cause: Secondary | ICD-10-CM | POA: Insufficient documentation

## 2013-01-25 DIAGNOSIS — F329 Major depressive disorder, single episode, unspecified: Secondary | ICD-10-CM | POA: Insufficient documentation

## 2013-01-25 DIAGNOSIS — S90569A Insect bite (nonvenomous), unspecified ankle, initial encounter: Secondary | ICD-10-CM | POA: Insufficient documentation

## 2013-01-25 DIAGNOSIS — E119 Type 2 diabetes mellitus without complications: Secondary | ICD-10-CM | POA: Insufficient documentation

## 2013-01-25 DIAGNOSIS — Z8719 Personal history of other diseases of the digestive system: Secondary | ICD-10-CM | POA: Insufficient documentation

## 2013-01-25 DIAGNOSIS — Z8673 Personal history of transient ischemic attack (TIA), and cerebral infarction without residual deficits: Secondary | ICD-10-CM | POA: Insufficient documentation

## 2013-01-25 DIAGNOSIS — Z8739 Personal history of other diseases of the musculoskeletal system and connective tissue: Secondary | ICD-10-CM | POA: Insufficient documentation

## 2013-01-25 DIAGNOSIS — Y9389 Activity, other specified: Secondary | ICD-10-CM | POA: Insufficient documentation

## 2013-01-25 DIAGNOSIS — Z79899 Other long term (current) drug therapy: Secondary | ICD-10-CM | POA: Insufficient documentation

## 2013-01-25 DIAGNOSIS — R531 Weakness: Secondary | ICD-10-CM

## 2013-01-25 DIAGNOSIS — W57XXXA Bitten or stung by nonvenomous insect and other nonvenomous arthropods, initial encounter: Secondary | ICD-10-CM

## 2013-01-25 DIAGNOSIS — K219 Gastro-esophageal reflux disease without esophagitis: Secondary | ICD-10-CM | POA: Insufficient documentation

## 2013-01-25 MED ORDER — CEPHALEXIN 500 MG PO CAPS: 500.0000 mg | ORAL_CAPSULE | Freq: Four times a day (QID) | ORAL | Status: DC

## 2013-01-25 MED ORDER — SODIUM CHLORIDE 0.9 % IV SOLN
Freq: Once | INTRAVENOUS | Status: AC
  Administered 2013-01-25: 15:00:00 via INTRAVENOUS

## 2013-01-25 NOTE — ED Provider Notes (Signed)
History     CSN: [id number]  Arrival date & time 01/25/13  1411   First MD Initiated Contact with Patient 01/25/13 1438      Chief Complaint  Patient presents with  . Oral Swelling  . Numbness  . Leg Swelling    HPI: Patient is a 79 y.o. female presenting with weakness. The history is provided by the patient.  Weakness This is a new problem. The current episode started 1 to 2 hours ago. The problem has been gradually improving. Associated symptoms include shortness of breath. Pertinent negatives include no chest pain, no abdominal pain and no headaches.  Angela Wang is a 79 y/o female that presents today with c/o weaknwess to her (L) foot, numbness and tingling to her (R) hand and face and feeling as though her throat was swelling. States while at church she became somewhat foggy, dizzy and nauseated. She returned home and began to feel very "anxious that something was going to happen". At some point during this time she states he (L) foot felt like "jelly" and was difficulty to walk on. At the same time she noted numbness and tingling in her (R) hand and face. She states these symptoms have resolved but she still has the sensation of SOB and that her throat feels swollen. Admits to h/o CVA, aneurysnm (coiling). Pt believes she was bitten by a spider 2 to 3 days ago on her LLE and thinks her symptoms may be related to this. States her LLE is swollen.  Denies CP, vomiting or abd pain. .   Past Medical History  Diagnosis Date  . Allergy     allergic rhinitis  . Anemia     nos  . Depression   . Diabetes mellitus     type II, diet controlled  . Hyperlipidemia   . Hypertension   . Arthritis     osteoarthritis  . Stroke   . History of solitary pulmonary nodule     right, stable by CT  . Diverticulosis   . Fibromyalgia   . IBS (irritable bowel syndrome)   . GERD (gastroesophageal reflux disease)   . Atypical chest pain 09/2008    negative myoview  . Leukopenia     chronic  .  Chronic sinusitis   . Routine general medical examination at a health care facility     Past Surgical History  Procedure Laterality Date  . Appendectomy    . Cholecystectomy    . Abdominal hysterectomy    . Joint replacement      total right knee replacement  . Aneurysm coiling      Family History  Problem Relation Age of Onset  . Cancer Mother     pancreatic  . Coronary artery disease Father   . Cancer Sister      breast  . Asthma Sister   . Cancer Brother     prostate  . Allergies Brother   . Cancer Maternal Aunt     colon  . Diabetes Other   . Cancer Other     ovarian    History  Substance Use Topics  . Smoking status: Never Smoker   . Smokeless tobacco: Never Used  . Alcohol Use: No    OB History   Grav Para Term Preterm Abortions TAB SAB Ect Mult Living                  Review of Systems  Respiratory: Positive for shortness of breath.   Cardiovascular:  Negative for chest pain.  Gastrointestinal: Negative for abdominal pain.  Neurological: Positive for weakness. Negative for headaches.  All other systems reviewed and are negative.    Allergies  Amitriptyline hcl; Aspirin; Codeine; Escitalopram oxalate; Hydrocodone-acetaminophen; Latex; Lorazepam; Oxycodone-acetaminophen; and Terfenadine  Home Medications   Current Outpatient Rx  Name  Route  Sig  Dispense  Refill  . ALPRAZolam (XANAX) 0.5 MG tablet   Oral   Take 1 tablet (0.5 mg total) by mouth 3 (three) times daily as needed.   90 tablet   3   . B Complex Vitamins (B COMPLEX 100 PO)   Oral   Take 1 tablet by mouth daily.           . Calcium Carbonate-Vitamin D (CALCIUM-VITAMIN D) 500-200 MG-UNIT per tablet   Oral   Take 1 tablet by mouth 3 (three) times daily.           . clopidogrel (PLAVIX) 75 MG tablet   Oral   Take 75 mg by mouth daily.           Marland Kitchen co-enzyme Q-10 30 MG capsule   Oral   Take 30 mg by mouth 3 (three) times daily.           . furosemide (LASIX) 20 MG  tablet   Oral   Take 20 mg by mouth daily.           Marland Kitchen gabapentin (NEURONTIN) 300 MG capsule   Oral   Take 300 mg by mouth 3 (three) times daily.           . Magnesium 500 MG TABS   Oral   Take 1 tablet by mouth daily.           . traMADol (ULTRAM) 50 MG tablet      Take 1 tablet by mouth once a day as needed for neck pain   30 tablet   0   . HYDROmorphone (DILAUDID) 4 MG tablet      Take 1/2 to 1 tablet by mouth daily as needed.   15 tablet   0   . loteprednol (LOTEMAX) 0.5 % ophthalmic suspension   Right Eye   Place 1 drop into the right eye 2 (two) times daily.           . Melatonin 1 MG TABS      Take 1/2 tablet at 4pm and 8pm          . Milnacipran HCl (SAVELLA TITRATION PACK) 12.5 & 25 & 50 MG MISC      Take as directed   1 each   0   . omeprazole-sodium bicarbonate (ZEGERID) 40-1100 MG per capsule   Oral   Take 1 capsule by mouth daily.             There were no vitals taken for this visit.  Physical Exam  Constitutional: She is oriented to person, place, and time. She appears well-developed and well-nourished.  HENT:  Head: Normocephalic and atraumatic.  Mouth/Throat: Uvula is midline, oropharynx is clear and moist and mucous membranes are normal.  Eyes: Conjunctivae and EOM are normal. Pupils are equal, round, and reactive to light.  Neck: Neck supple.  Cardiovascular: Normal rate and regular rhythm.   Pulmonary/Chest: Effort normal and breath sounds normal.  Musculoskeletal: Normal range of motion.  1-2+ pitting edema to BLE L>R. No signs of inflammation.infectious process. Appears chronic.  Neurological: She is alert and oriented to person, place, and time. She  has normal strength. No cranial nerve deficit. GCS eye subscore is 4. GCS verbal subscore is 5. GCS motor subscore is 6.    ED Course  Procedures   Labs Reviewed - No data to display No results found.   No diagnosis found.    MDM  79 y/o old w/ h/o CVA and aneurysm  coiling w/ c/o (L) foot weakness, numbness and tingling to (R) face and (R) hand while after a period of feeling "foggy", dizzy and nauseated at church. Also states she feels her throat is swollen and it is difficult to breathe. Denies CP. No focal neurological deficits on exam. BBS CTA, no objective SOB. VSS. Given pt's age and hx will transfer to [facility] ED for further eval.         Leanne Chang, NP 01/25/13 1500

## 2013-01-25 NOTE — ED Provider Notes (Signed)
Medical screening examination/treatment/procedure(s) were performed by non-physician practitioner and as supervising physician I was immediately available for consultation/collaboration.  David Keller, M.D.  David C Keller, MD 01/25/13 2020 

## 2013-01-25 NOTE — ED Notes (Signed)
Security at bedside to transport to [facility].

## 2013-01-25 NOTE — ED Notes (Signed)
HR regular; sinus rhythm in 70s.  Denies any CP.

## 2013-01-25 NOTE — ED Notes (Signed)
Report given to [facility]. 

## 2013-01-25 NOTE — ED Provider Notes (Signed)
History     CSN: [id number]  Arrival date & time 01/25/13  1521   First MD Initiated Contact with Patient 01/25/13 1601      Chief Complaint  Patient presents with  . Leg Swelling    HPI States while at church she became somewhat foggy, dizzy and nauseated. She returned home and began to feel very "anxious that something was going to happen". At some point during this time she states he (L) foot felt like "jelly" and was difficulty to walk on. At the same time she noted numbness and tingling in her (R) hand and face. She states these symptoms have resolved but she still has the sensation of SOB and that her throat feels swollen. Admits to h/o CVA, aneurysnm (coiling). Pt believes she was bitten by a spider 2 to 3 days ago on her LLE and thinks her symptoms may be related to this. States her LLE is swollen. Denies CP, vomiting or abd pain.  Past Medical History  Diagnosis Date  . Allergy     allergic rhinitis  . Anemia     nos  . Depression   . Diabetes mellitus     type II, diet controlled  . Hyperlipidemia   . Hypertension   . Arthritis     osteoarthritis  . Stroke   . History of solitary pulmonary nodule     right, stable by CT  . Diverticulosis   . Fibromyalgia   . IBS (irritable bowel syndrome)   . GERD (gastroesophageal reflux disease)   . Atypical chest pain 09/2008    negative myoview  . Leukopenia     chronic  . Chronic sinusitis   . Routine general medical examination at a health care facility     Past Surgical History  Procedure Laterality Date  . Appendectomy    . Cholecystectomy    . Abdominal hysterectomy    . Joint replacement      total right knee replacement  . Aneurysm coiling      Family History  Problem Relation Age of Onset  . Cancer Mother     pancreatic  . Coronary artery disease Father   . Cancer Sister      breast  . Asthma Sister   . Cancer Brother     prostate  . Allergies Brother   . Cancer Maternal Aunt     colon  .  Diabetes Other   . Cancer Other     ovarian    History  Substance Use Topics  . Smoking status: Never Smoker   . Smokeless tobacco: Never Used  . Alcohol Use: No    OB History   Grav Para Term Preterm Abortions TAB SAB Ect Mult Living                  Review of Systems All other systems reviewed and are negative Allergies  Amitriptyline hcl; Aspirin; Codeine; Escitalopram oxalate; Hydrocodone-acetaminophen; Latex; Lorazepam; Oxycodone-acetaminophen; Terfenadine; and Tramadol  Home Medications   Current Outpatient Rx  Name  Route  Sig  Dispense  Refill  . ALPRAZolam (XANAX) 0.5 MG tablet   Oral   Take 0.5 mg by mouth 3 (three) times daily as needed for sleep or anxiety.         . B Complex Vitamins (B COMPLEX 100 PO)   Oral   Take 1 tablet by mouth daily.           . Calcium Carbonate-Vitamin D (CALCIUM-VITAMIN D) 500-200  MG-UNIT per tablet   Oral   Take 1 tablet by mouth 3 (three) times daily.           . clopidogrel (PLAVIX) 75 MG tablet   Oral   Take 75 mg by mouth daily.           Marland Kitchen co-enzyme Q-10 30 MG capsule   Oral   Take 30 mg by mouth 3 (three) times daily.           . furosemide (LASIX) 20 MG tablet   Oral   Take 20 mg by mouth daily.          Marland Kitchen gabapentin (NEURONTIN) 300 MG capsule   Oral   Take 300 mg by mouth 3 (three) times daily.           Marland Kitchen HYDROmorphone (DILAUDID) 4 MG tablet   Oral   Take 2-4 mg by mouth every 6 (six) hours as needed for pain.         . Magnesium 500 MG TABS   Oral   Take 1 tablet by mouth daily.           Marland Kitchen olopatadine (PATANOL) 0.1 % ophthalmic solution   Right Eye   Place 1 drop into the right eye 2 (two) times daily.         . cephALEXin (KEFLEX) 500 MG capsule   Oral   Take 1 capsule (500 mg total) by mouth 4 (four) times daily.   20 capsule   0     BP 135/75  Temp(Src) 97.7 F (36.5 C) (Oral)  Resp 13  SpO2 100%  Physical Exam  Constitutional: She is oriented to person, place,  and time. She appears well-developed and well-nourished.  HENT:  Head: Normocephalic and atraumatic.  Mouth/Throat: Uvula is midline, oropharynx is clear and moist and mucous membranes are normal.  Eyes: Conjunctivae and EOM are normal. Pupils are equal, round, and reactive to light.  Neck: Neck supple.  Cardiovascular: Normal rate and regular rhythm.   Pulmonary/Chest: Effort normal and breath sounds normal.  Musculoskeletal: Normal range of motion.  1-2+ pitting edema to BLE L>R. No signs of inflammation.infectious process. Appears chronic.  Neurological: She is alert and oriented to person, place, and time. She has normal strength. No cranial nerve deficit. GCS eye subscore is 4. GCS verbal subscore is 5. GCS motor subscore is 6.   Patient did have a very tight knee brace on her left knee which she says she wore most of the time.  Question is whether this may be the source of her lower extremity edema in that the thigh appears to have no edema.  All bleeding appears to be related to knee and below.  I can't really appreciate a bite area on her lower extremity. ED Course  Procedures (including critical care time)  Labs Reviewed - No data to display No results found.   1. Swelling of left lower extremity   2. Nonvenomous spider bite of lower leg, left, initial encounter       MDM   After the patient was examined for a blood clot and found not to have one she was very anxious to go home and she did not want to stay for further testing.       Nelia Shi, MD 01/25/13 [phone]

## 2013-01-25 NOTE — ED Notes (Signed)
Pt placed on cardiac monitor (bpm 76) and 2L O2 by [place] (spO2 100).  CBG obtained (83mg /dl). [facility] was called, truck is en route. ED charge nurse ([place]) was called and given transfer report. Pt is awake and alert currently.

## 2013-01-25 NOTE — ED Notes (Signed)
Pt states L lower leg pain, 9/10 at the time, increases with movement. Also states she became SOB this morning and felt like her throat was closing up. Swelling noted to L lower leg. Respirations unlabored. Lung sounds clear and equal bilaterally. She is alert and oriented x4. No signs of acute distress noted.

## 2013-01-25 NOTE — ED Notes (Signed)
Pt presents to department via [facility] from [facility] for evaluation of L lower leg pain. Swelling noted to lower leg, no numbness/tingling. States "I think there is a spider bite on my L leg." CBG 83. Pt is conscious alert and oriented x4. Able to move all extremities.

## 2013-01-25 NOTE — ED Notes (Addendum)
Believes she was bitten "3 times" by unseen spider 7 days ago - had LLE itching.  Has since had LLE swelling and pain.  Today @ approx 1230 felt "woozy, throat feels swollen", started with right facial numbness and RUE numbness after church - drove herselft to [facility].  Very unsteady gait per assisting EMT.  States she normally has slight right hemiparesis secondary to previous CVA.  Bilat hand grasps = and strong; no arm drift noted.  Patient has "oxycodone-acetaminophen" listed as allergy, but takes it on occasion for pain - pt seems unsure of reaction.  Pt A & Ox3, conversing appropriately and without difficulty.  Daughter notified per pt of transfer to ED.

## 2013-01-25 NOTE — Progress Notes (Signed)
VASCULAR LAB PRELIMINARY  PRELIMINARY  PRELIMINARY  PRELIMINARY  Left lower extremity venous Doppler completed.    Preliminary report:  There is no DVT or SVT noted in the left lower extremity.  Chadric Kimberley, RVT 01/25/2013, 4:47 PM

## 2013-03-04 ENCOUNTER — Other Ambulatory Visit (HOSPITAL_COMMUNITY): Payer: Self-pay | Admitting: Orthopedic Surgery

## 2013-03-04 DIAGNOSIS — M25561 Pain in right knee: Secondary | ICD-10-CM

## 2013-03-10 ENCOUNTER — Encounter (HOSPITAL_COMMUNITY): Payer: Medicare Other

## 2013-03-10 ENCOUNTER — Encounter (HOSPITAL_COMMUNITY): Payer: Medicare Other | Attending: Orthopedic Surgery

## 2013-04-08 ENCOUNTER — Ambulatory Visit: Payer: Self-pay | Admitting: Nurse Practitioner

## 2013-06-29 ENCOUNTER — Ambulatory Visit (INDEPENDENT_AMBULATORY_CARE_PROVIDER_SITE_OTHER): Payer: Medicare Other | Admitting: Internal Medicine

## 2013-06-29 ENCOUNTER — Encounter: Payer: Self-pay | Admitting: Internal Medicine

## 2013-06-29 ENCOUNTER — Other Ambulatory Visit (INDEPENDENT_AMBULATORY_CARE_PROVIDER_SITE_OTHER): Payer: Medicare Other

## 2013-06-29 VITALS — BP 148/82 | HR 81 | Temp 97.0°F | Ht 67.0 in | Wt 154.1 lb

## 2013-06-29 DIAGNOSIS — E785 Hyperlipidemia, unspecified: Secondary | ICD-10-CM

## 2013-06-29 DIAGNOSIS — M8580 Other specified disorders of bone density and structure, unspecified site: Secondary | ICD-10-CM

## 2013-06-29 DIAGNOSIS — D649 Anemia, unspecified: Secondary | ICD-10-CM

## 2013-06-29 DIAGNOSIS — IMO0001 Reserved for inherently not codable concepts without codable children: Secondary | ICD-10-CM

## 2013-06-29 DIAGNOSIS — E162 Hypoglycemia, unspecified: Secondary | ICD-10-CM

## 2013-06-29 DIAGNOSIS — E039 Hypothyroidism, unspecified: Secondary | ICD-10-CM

## 2013-06-29 DIAGNOSIS — M79609 Pain in unspecified limb: Secondary | ICD-10-CM

## 2013-06-29 DIAGNOSIS — I10 Essential (primary) hypertension: Secondary | ICD-10-CM

## 2013-06-29 DIAGNOSIS — M899 Disorder of bone, unspecified: Secondary | ICD-10-CM

## 2013-06-29 DIAGNOSIS — M5430 Sciatica, unspecified side: Secondary | ICD-10-CM

## 2013-06-29 DIAGNOSIS — M5431 Sciatica, right side: Secondary | ICD-10-CM

## 2013-06-29 NOTE — Assessment & Plan Note (Signed)
Will recheck CBC today. 

## 2013-06-29 NOTE — Progress Notes (Signed)
HPI  Pt presents to the clinic today to establish care. She is transferring care from [facility]. She does have multiple chronic conditions, but the main one that concerns her is her chronic pain. She has chronic pain in her breast, abdomen and right leg. She describes the pain as a burning sensation, sharp and shooting. She has taken tylenol for this. It has not helped. She has allergies to many other pain medications. She was referred to pain management but has not been seen. She did not want to go. She is on Neurontin but this did not help the pain.  Flu: 2012 Tetanus: 2013 Pneumovax: 2013 Zostovax: 2014 Pap smear: 2014 Mammogram: 2014 Colonoscopy: 2012 Bone Scan: 2012 Eye doctor: yearly Dentist: as needed  Past Medical History  Diagnosis Date  . Allergy     allergic rhinitis  . Anemia     nos  . Depression   . Diabetes mellitus     type II, diet controlled  . Hyperlipidemia   . Hypertension   . Arthritis     osteoarthritis  . Stroke   . History of solitary pulmonary nodule     right, stable by CT  . Diverticulosis   . Fibromyalgia   . IBS (irritable bowel syndrome)   . GERD (gastroesophageal reflux disease)   . Atypical chest pain 09/2008    negative myoview  . Leukopenia     chronic  . Chronic sinusitis   . Routine general medical examination at a health care facility   . VENOUS INSUFFICIENCY 05/03/2008    Qualifier: Diagnosis of  By: Renaldo Fiddler CMA, Marliss Czar    . UNSTEADY GAIT 07/28/2009    Qualifier: Diagnosis of  By: Nena Jordan   . THROMBOCYTOPENIA 02/09/2009    Qualifier: Diagnosis of  By: Nena Jordan   . SCIATICA, RIGHT 05/06/2008    Qualifier: Diagnosis of  By: Riley Lam, CMA, Sarah    . PULMONARY NODULE 12/26/2007    Qualifier: Diagnosis of  By: Nena Jordan   . Lonna Duval NOS-Unspec 06/21/2007    Centricity Description: DEGENERATIVE JOINT DISEASE, MILD Qualifier: Diagnosis of  By: Nena Jordan  Centricity Description: OSTEOARTHRITIS  Qualifier: Diagnosis of  By: Nena Jordan   . NUMBNESS 01/29/2008    Qualifier: Diagnosis of  By: Everardo All MD, Cleophas Dunker     Current Outpatient Prescriptions  Medication Sig Dispense Refill  . ALPRAZolam (XANAX) 0.5 MG tablet Take 0.5 mg by mouth 3 (three) times daily as needed for sleep or anxiety.      Marland Kitchen amoxicillin (AMOXIL) 875 MG tablet Take 1 by mouth twice a day      . B Complex Vitamins (B COMPLEX 100 PO) Take 1 tablet by mouth daily.        . Calcium Carbonate-Vitamin D (CALCIUM-VITAMIN D) 500-200 MG-UNIT per tablet Take 1 tablet by mouth 3 (three) times daily.        . clopidogrel (PLAVIX) 75 MG tablet Take 75 mg by mouth daily.        Marland Kitchen co-enzyme Q-10 30 MG capsule Take 30 mg by mouth 3 (three) times daily.        . furosemide (LASIX) 20 MG tablet Take 20 mg by mouth daily.       Marland Kitchen gabapentin (NEURONTIN) 300 MG capsule Take 300 mg by mouth 3 (three) times daily.        Marland Kitchen HYDROmorphone (DILAUDID) 4 MG tablet Take 2-4 mg by mouth  every 6 (six) hours as needed for pain.      . Magnesium 500 MG TABS Take 1 tablet by mouth daily.        Marland Kitchen olopatadine (PATANOL) 0.1 % ophthalmic solution Place 1 drop into the right eye 2 (two) times daily.       No current facility-administered medications for this visit.    Allergies  Allergen Reactions  . Amitriptyline Hcl     REACTION: throat swelling  . Aspirin     REACTION: stomach upset  . Codeine     REACTION: stomach upset  . Escitalopram Oxalate     REACTION: Hallucinations  . Hydrocodone-Acetaminophen     REACTION: stomach upset  . Latex     REACTION: rash  . Lorazepam     REACTION: Throat Swelling  . Oxycodone-Acetaminophen     REACTION: Nausea and upset stomach  . Terfenadine     REACTION: stomach upset  . Tramadol Nausea And Vomiting    Family History  Problem Relation Age of Onset  . Cancer Mother     pancreatic  . Coronary artery disease Father   . Cancer Sister      breast  . Asthma Sister   . Cancer Brother      prostate  . Allergies Brother   . Cancer Maternal Aunt     colon  . Diabetes Other   . Cancer Other     ovarian    History   Social History  . Marital Status: Widowed    Spouse Name: N/A    Number of Children: 4  . Years of Education: N/A   Occupational History  . retired     [employer]   Social History Main Topics  . Smoking status: Never Smoker   . Smokeless tobacco: Never Used  . Alcohol Use: No  . Drug Use: No  . Sexually Active: Not on file   Other Topics Concern  . Not on file   Social History Narrative  . No narrative on file    ROS:  Constitutional: Denies fever, malaise, fatigue, headache or abrupt weight changes.  HEENT: Pt reports blurred vision in right eye since stroke. Denies eye pain, eye redness, ear pain, ringing in the ears, wax buildup, runny nose, nasal congestion, bloody nose, or sore throat. Respiratory: Denies difficulty breathing, shortness of breath, cough or sputum production.   Cardiovascular: Denies chest pain, chest tightness, palpitations or swelling in the hands or feet.  Gastrointestinal: Pt reports abdominal pain. Denies abdominal pain, bloating, constipation, diarrhea or blood in the stool.  GU: Denies frequency, urgency, pain with urination, blood in urine, odor or discharge. Musculoskeletal: Pt reports right leg pain. Denies decrease in range of motion, difficulty with gait, muscle pain or joint pain and swelling.  Skin: Denies redness, rashes, lesions or ulcercations.  Neurological: Denies dizziness, difficulty with memory, difficulty with speech or problems with balance and coordination.   No other specific complaints in a complete review of systems (except as listed in HPI above).  PE:  BP 148/82  Pulse 81  Temp(Src) 97 F (36.1 C) (Oral)  Ht 5\' 7"  (1.702 m)  Wt 154 lb 1.9 oz (69.908 kg)  BMI 24.13 kg/m2  SpO2 92% Wt Readings from Last 3 Encounters:  06/29/13 154 lb 1.9 oz (69.908 kg)  03/15/11 154 lb (69.854 kg)   01/11/11 152 lb 12 oz (69.287 kg)    General: Appears her stated age, chronically ill appearing in NAD. HEENT: Head:  normal shape and size; Eyes: sclera white, no icterus, conjunctiva pink, PERRLA and EOMs intact; Ears: Tm's gray and intact, normal light reflex; Nose: mucosa pink and moist, septum midline; Throat/Mouth: Teeth present, mucosa pink and moist, no lesions or ulcerations noted.  Neck: Normal range of motion. Neck supple, trachea midline. No massses, lumps or thyromegaly present.  Cardiovascular: Normal rate and rhythm. S1,S2 noted.  No murmur, rubs or gallops noted. No JVD or BLE edema. No carotid bruits noted. Pulmonary/Chest: Normal effort and positive vesicular breath sounds. No respiratory distress. No wheezes, rales or ronchi noted.  Abdomen: Soft and nontender. Normal bowel sounds, no bruits noted. No distention or masses noted. Liver, spleen and kidneys non palpable. Musculoskeletal: Normal range of motion. No signs of joint swelling. No difficulty with gait.  Neurological: Alert and oriented. Cranial nerves II-XII intact. Coordination normal. +DTRs bilaterally. Psychiatric: Mood and affect normal. Behavior is normal. Judgment and thought content normal.     BMET    Component Value Date/Time   NA 140 01/09/2011 0040   K 3.8 01/09/2011 0040   CL 105 01/09/2011 0040   CO2 26 10/27/2010 1819   GLUCOSE 103* 01/09/2011 0040   BUN 8 01/09/2011 0040   CREATININE 0.9 01/09/2011 0040   CALCIUM 9.0 10/27/2010 1819   GFRNONAA 107.87 05/01/2010 1611   GFRAA  Value: >60        The eGFR has been calculated using the MDRD equation. This calculation has not been validated in all clinical situations. eGFR's persistently <60 mL/min signify possible Chronic Kidney Disease. 07/22/2009 0500    Lipid Panel     Component Value Date/Time   CHOL  Value: 165        ATP III CLASSIFICATION:  <200     mg/dL   Desirable  161-096  mg/dL   Borderline High  >=045    mg/dL   High        03/04/8118 0500    TRIG 39 07/22/2009 0500   HDL 67 07/22/2009 0500   CHOLHDL 2.5 07/22/2009 0500   VLDL 8 07/22/2009 0500   LDLCALC  Value: 90        Total Cholesterol/HDL:CHD Risk Coronary Heart Disease Risk Table                     Men   Women  1/2 Average Risk   3.4   3.3  Average Risk       5.0   4.4  2 X Average Risk   9.6   7.1  3 X Average Risk  23.4   11.0        Use the calculated Patient Ratio above and the CHD Risk Table to determine the patient's CHD Risk.        ATP III CLASSIFICATION (LDL):  <100     mg/dL   Optimal  147-829  mg/dL   Near or Above                    Optimal  130-159  mg/dL   Borderline  562-130  mg/dL   High  >865     mg/dL   Very High 7/84/6962 9528    CBC    Component Value Date/Time   WBC 3.6* 10/17/2010 2040   WBC 4.7 04/18/2006 0953   RBC 4.60 10/17/2010 2040   RBC 4.26 04/18/2006 0953   HGB 12.6 01/09/2011 0040   HGB 11.4* 04/18/2006 0953   HCT 37.0 01/09/2011 0040  HCT 35.2 04/18/2006 0953   PLT 188 10/17/2010 2040   PLT 189 04/18/2006 0953   MCV 82.2 10/17/2010 2040   MCV 82.8 04/18/2006 0953   MCH 26.7 04/18/2006 0953   MCHC 31.7 10/17/2010 2040   MCHC 32.2 04/18/2006 0953   RDW 14.6 10/17/2010 2040   RDW 14.4 04/18/2006 0953   LYMPHSABS 1.8 10/17/2010 2040   LYMPHSABS 1.6 04/18/2006 0953   MONOABS 0.5 10/17/2010 2040   MONOABS 0.7 04/18/2006 0953   EOSABS 0.1 10/17/2010 2040   EOSABS 0.2 04/18/2006 0953   BASOSABS 0.0 10/17/2010 2040   BASOSABS 0.1 04/18/2006 0953    Hgb A1C Lab Results  Component Value Date   HGBA1C 6.0* 10/17/2010     Assessment and Plan:  Prevent health:  Encouraged pt to visit dentist at least annually We will obtain your medical records from [facility]

## 2013-06-29 NOTE — Assessment & Plan Note (Signed)
Will recheck lipid profile today 

## 2013-06-29 NOTE — Assessment & Plan Note (Signed)
Chronic sciatica MRI done and reviewed Will refer to neurosurgeon

## 2013-06-29 NOTE — Assessment & Plan Note (Signed)
Uncontrolled ? Flare Unable to take most pain meds eRx for pred taper

## 2013-06-29 NOTE — Assessment & Plan Note (Signed)
Will recheck TSH today

## 2013-06-29 NOTE — Assessment & Plan Note (Signed)
Elevated today ? Pain, given new recommendation by [facility] 8 will not adjust meds at this time

## 2013-06-29 NOTE — Patient Instructions (Addendum)

## 2013-06-30 ENCOUNTER — Other Ambulatory Visit: Payer: Medicare Other

## 2013-06-30 LAB — CBC
Hemoglobin: 11.6 g/dL — ABNORMAL LOW (ref 12.0–15.0)
MCHC: 31.4 g/dL (ref 30.0–36.0)
MCV: 86 fl (ref 78.0–100.0)
Platelets: 190 10*3/uL (ref 150.0–400.0)

## 2013-07-01 LAB — COMPREHENSIVE METABOLIC PANEL
AST: 21 U/L (ref 0–37)
Alkaline Phosphatase: 60 U/L (ref 39–117)
BUN: 13 mg/dL (ref 6–23)
Creatinine, Ser: 0.8 mg/dL (ref 0.4–1.2)

## 2013-07-01 LAB — LIPID PANEL
HDL: 93.7 mg/dL (ref >39.00)
LDL Cholesterol: 70 mg/dL (ref 0–99)
Total CHOL/HDL Ratio: 2
Triglycerides: 51 mg/dL (ref 0.0–149.0)

## 2013-07-10 ENCOUNTER — Encounter: Payer: Self-pay | Admitting: Nurse Practitioner

## 2013-07-10 ENCOUNTER — Ambulatory Visit (INDEPENDENT_AMBULATORY_CARE_PROVIDER_SITE_OTHER): Payer: Medicare Other | Admitting: Nurse Practitioner

## 2013-07-10 VITALS — BP 116/66 | HR 88 | Ht 67.0 in | Wt 159.0 lb

## 2013-07-10 DIAGNOSIS — I6340 Cerebral infarction due to embolism of unspecified cerebral artery: Secondary | ICD-10-CM | POA: Insufficient documentation

## 2013-07-10 DIAGNOSIS — R51 Headache: Secondary | ICD-10-CM

## 2013-07-10 DIAGNOSIS — M542 Cervicalgia: Secondary | ICD-10-CM

## 2013-07-10 MED ORDER — GABAPENTIN 300 MG PO CAPS: 300.0000 mg | ORAL_CAPSULE | Freq: Three times a day (TID) | ORAL | Status: DC

## 2013-07-10 NOTE — Progress Notes (Signed)
Reason for visit  Follow up for neck pain and headache HPI: Angela Wang, 79 year old female returns for followup. She was last seen in our office 07/15/12  She continues to have neck pain but has refused epidural steroids. She is ambulating with a cane, no falls in over a year.  She is driving without difficulty. She remains on gabapentin and she says her headaches and neck pain are well controlled. She has no tenderness over the temporal area bilaterally. She has hip pain,back pain,   knee pain and foot pain. She says she is seeing Dr. Gerlene Fee next week for evaluation of back pain. She has recently been on prednisone which caused  weight gain      PRIOR HPI: With history of stroke and aneurysm, status post coiling, depression and anxiety, here for evaluation of headache and neck pain since February 2012.  Patient describes progressive global headache with sharp and throbbing sensation.  It is associated with feeling of her complaints being swollen in her neck, neck stiffness and neck pain. Sudden movements aggravate her neck pain. She denies any problems with her arms or legs. She has chronic right neck and right arm pain and weakness. She is history of shingles affecting this region.  She denies nausea, vomiting, photophobia, phonophobia, vision changes with these headaches. She has tried Topamax, amitriptyline, Dilaudid, Flexeril, Valium, tramadol without relief.  She is into the emergency room in Feb and May 2012 without relief.  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 She has been  seen by neurology at [facility] and [facility]. Had MRI brain with [facility] and apparently no significant abnl. On gabapentin and percocet. Also was recommended to have neck episudral steroid injections, but she declined.  ROS:  Weight gain, constipation, feeling hot, joint pain, joint swelling, numbness,  weakness, tremor, anxiety, disinterest in activities   Medications Current Outpatient Prescriptions on File Prior to Visit  Medication Sig Dispense Refill  . ALPRAZolam (XANAX) 0.5 MG tablet Take 0.5 mg by mouth 3 (three) times daily as needed for sleep or anxiety.      Marland Kitchen amoxicillin (AMOXIL) 875 MG tablet Take 1 by mouth twice a day      . B Complex Vitamins (B COMPLEX 100 PO) Take 1 tablet by mouth daily.        . Calcium Carbonate-Vitamin D (CALCIUM-VITAMIN D) 500-200 MG-UNIT per tablet Take 1 tablet by mouth 3 (three) times daily.        . clopidogrel (PLAVIX) 75 MG tablet Take 75 mg by mouth daily.        Marland Kitchen co-enzyme Q-10 30 MG capsule Take 30 mg by mouth 3 (three) times daily.        . furosemide (LASIX) 20 MG tablet Take 20 mg by mouth daily.       Marland Kitchen gabapentin (NEURONTIN) 300 MG capsule Take 300 mg by mouth 3 (three) times daily.        . Magnesium 500 MG TABS Take 1 tablet by mouth daily.         No current facility-administered medications on file prior to visit.    Allergies  Allergies  Allergen Reactions  . Amitriptyline Hcl     REACTION: throat swelling  . Aspirin     REACTION: stomach upset  . Codeine     REACTION: stomach upset  . Escitalopram Oxalate     REACTION:  Hallucinations  . Hydrocodone-Acetaminophen     REACTION: stomach upset  . Latex     REACTION: rash  . Lorazepam     REACTION: Throat Swelling  . Oxycodone-Acetaminophen     REACTION: Nausea and upset stomach  . Terfenadine     REACTION: stomach upset  . Tramadol Nausea And Vomiting    Physical Exam General: well developed, well nourished, seated, in no evident distress Head: head normocephalic and atraumatic. Oropharynx benign. No temporal tenderness to palpation bilaterally Neck: supple with left carotid   bruit Cardiovascular: regular rate and rhythm, no murmurs  Neurologic Exam Mental Status: Awake and fully alert. Oriented to place and time. Follows all commands. Speech and language  normal.   Cranial Nerves: Fundoscopic exam hard to visualize. Pupils equal, briskly reactive to light. Extraocular movements full without nystagmus. Visual fields full to confrontation. Hearing intact and symmetric to finger snap. Facial sensation intact. Face, tongue, palate move normally and symmetrically. Neck flexion and extension normal.  Motor: Normal bulk and tone. Normal strength in all tested extremity muscles.No focal weakness Sensory.: intact to touch and pinprick and vibratory in upper and lower extremities.  Coordination: Rapid alternating movements normal in all extremities. Finger-to-nose and heel-to-shin performed accurately bilaterally. No dysmetria Gait and Station: Arises from chair without difficulty. Stance is normal. Gait demonstrates normal stride length and balance . Able to heel, toe and mild difficulty with tandem walk. Using single-point cane  Reflexes: Trace  and symmetric except absent ankle jerks.    ASSESSMENT: History of headache and neck pain since February 2012 well fairly well controlled on gabapentin. Neurologic exam is unremarkable     PLAN: Continue gabapentin at current dose, works best when  taken as prescribed Will refill gabapentin Followup yearly and when necessary  Nilda Riggs, GNP-BC APRN

## 2013-07-10 NOTE — Patient Instructions (Addendum)
Continue gabapentin at current dose, works best when  taken as prescribed Will refill gabapentin Followup yearly and when necessary

## 2013-07-13 ENCOUNTER — Telehealth: Payer: Self-pay | Admitting: *Deleted

## 2013-07-13 ENCOUNTER — Other Ambulatory Visit: Payer: Self-pay | Admitting: Internal Medicine

## 2013-07-13 DIAGNOSIS — G8929 Other chronic pain: Secondary | ICD-10-CM

## 2013-07-13 NOTE — Telephone Encounter (Signed)
Called pt and had to lvm, concerning a letter to [facility]; Angela Wang has put in a referral in to pain management. Told pt that they will call her and schedule this appointment.

## 2013-07-16 ENCOUNTER — Other Ambulatory Visit: Payer: Self-pay | Admitting: Neurosurgery

## 2013-07-16 DIAGNOSIS — M4310 Spondylolisthesis, site unspecified: Secondary | ICD-10-CM

## 2013-07-22 ENCOUNTER — Encounter: Payer: Self-pay | Admitting: Physical Medicine & Rehabilitation

## 2013-08-10 ENCOUNTER — Encounter (HOSPITAL_COMMUNITY): Payer: Self-pay | Admitting: Emergency Medicine

## 2013-08-10 DIAGNOSIS — J309 Allergic rhinitis, unspecified: Secondary | ICD-10-CM | POA: Insufficient documentation

## 2013-08-10 DIAGNOSIS — J329 Chronic sinusitis, unspecified: Secondary | ICD-10-CM | POA: Insufficient documentation

## 2013-08-10 DIAGNOSIS — M1990 Unspecified osteoarthritis, unspecified site: Secondary | ICD-10-CM | POA: Insufficient documentation

## 2013-08-10 DIAGNOSIS — R6883 Chills (without fever): Secondary | ICD-10-CM | POA: Insufficient documentation

## 2013-08-10 DIAGNOSIS — I10 Essential (primary) hypertension: Secondary | ICD-10-CM | POA: Insufficient documentation

## 2013-08-10 DIAGNOSIS — Z8669 Personal history of other diseases of the nervous system and sense organs: Secondary | ICD-10-CM | POA: Insufficient documentation

## 2013-08-10 DIAGNOSIS — H9209 Otalgia, unspecified ear: Secondary | ICD-10-CM | POA: Insufficient documentation

## 2013-08-10 DIAGNOSIS — J029 Acute pharyngitis, unspecified: Secondary | ICD-10-CM | POA: Insufficient documentation

## 2013-08-10 DIAGNOSIS — F329 Major depressive disorder, single episode, unspecified: Secondary | ICD-10-CM | POA: Insufficient documentation

## 2013-08-10 DIAGNOSIS — R059 Cough, unspecified: Secondary | ICD-10-CM | POA: Insufficient documentation

## 2013-08-10 DIAGNOSIS — F411 Generalized anxiety disorder: Secondary | ICD-10-CM | POA: Insufficient documentation

## 2013-08-10 DIAGNOSIS — Z8673 Personal history of transient ischemic attack (TIA), and cerebral infarction without residual deficits: Secondary | ICD-10-CM | POA: Insufficient documentation

## 2013-08-10 DIAGNOSIS — Z888 Allergy status to other drugs, medicaments and biological substances status: Secondary | ICD-10-CM | POA: Insufficient documentation

## 2013-08-10 DIAGNOSIS — M542 Cervicalgia: Secondary | ICD-10-CM | POA: Insufficient documentation

## 2013-08-10 DIAGNOSIS — M129 Arthropathy, unspecified: Secondary | ICD-10-CM | POA: Insufficient documentation

## 2013-08-10 DIAGNOSIS — IMO0001 Reserved for inherently not codable concepts without codable children: Secondary | ICD-10-CM | POA: Insufficient documentation

## 2013-08-10 DIAGNOSIS — G8929 Other chronic pain: Secondary | ICD-10-CM | POA: Insufficient documentation

## 2013-08-10 DIAGNOSIS — R05 Cough: Secondary | ICD-10-CM | POA: Insufficient documentation

## 2013-08-10 DIAGNOSIS — K219 Gastro-esophageal reflux disease without esophagitis: Secondary | ICD-10-CM | POA: Insufficient documentation

## 2013-08-10 DIAGNOSIS — R609 Edema, unspecified: Secondary | ICD-10-CM | POA: Insufficient documentation

## 2013-08-10 DIAGNOSIS — Z8719 Personal history of other diseases of the digestive system: Secondary | ICD-10-CM | POA: Insufficient documentation

## 2013-08-10 DIAGNOSIS — D649 Anemia, unspecified: Secondary | ICD-10-CM | POA: Insufficient documentation

## 2013-08-10 DIAGNOSIS — Z91040 Latex allergy status: Secondary | ICD-10-CM | POA: Insufficient documentation

## 2013-08-10 DIAGNOSIS — R599 Enlarged lymph nodes, unspecified: Secondary | ICD-10-CM | POA: Insufficient documentation

## 2013-08-10 DIAGNOSIS — G4700 Insomnia, unspecified: Secondary | ICD-10-CM | POA: Insufficient documentation

## 2013-08-10 DIAGNOSIS — Z8709 Personal history of other diseases of the respiratory system: Secondary | ICD-10-CM | POA: Insufficient documentation

## 2013-08-10 DIAGNOSIS — Z885 Allergy status to narcotic agent status: Secondary | ICD-10-CM | POA: Insufficient documentation

## 2013-08-10 DIAGNOSIS — E785 Hyperlipidemia, unspecified: Secondary | ICD-10-CM | POA: Insufficient documentation

## 2013-08-10 DIAGNOSIS — Z79899 Other long term (current) drug therapy: Secondary | ICD-10-CM | POA: Insufficient documentation

## 2013-08-10 DIAGNOSIS — L02419 Cutaneous abscess of limb, unspecified: Secondary | ICD-10-CM | POA: Insufficient documentation

## 2013-08-10 DIAGNOSIS — R5381 Other malaise: Secondary | ICD-10-CM | POA: Insufficient documentation

## 2013-08-10 DIAGNOSIS — E039 Hypothyroidism, unspecified: Secondary | ICD-10-CM | POA: Insufficient documentation

## 2013-08-10 DIAGNOSIS — K589 Irritable bowel syndrome without diarrhea: Secondary | ICD-10-CM | POA: Insufficient documentation

## 2013-08-10 DIAGNOSIS — E1159 Type 2 diabetes mellitus with other circulatory complications: Secondary | ICD-10-CM | POA: Insufficient documentation

## 2013-08-10 DIAGNOSIS — F3289 Other specified depressive episodes: Secondary | ICD-10-CM | POA: Insufficient documentation

## 2013-08-10 NOTE — ED Notes (Signed)
Pt. woke up with bilateral lateral neck pain worse with movement and certain positions , denies injury or fall , no fever or chills.

## 2013-08-11 ENCOUNTER — Encounter (HOSPITAL_COMMUNITY): Payer: Self-pay | Admitting: Radiology

## 2013-08-11 ENCOUNTER — Emergency Department (HOSPITAL_COMMUNITY)
Admission: EM | Admit: 2013-08-11 | Discharge: 2013-08-11 | Disposition: A | Discharge status: Home / Self Care | Payer: Medicare Other | Attending: Emergency Medicine | Admitting: Emergency Medicine

## 2013-08-11 ENCOUNTER — Emergency Department (HOSPITAL_COMMUNITY): Payer: Medicare Other

## 2013-08-11 DIAGNOSIS — M542 Cervicalgia: Secondary | ICD-10-CM

## 2013-08-11 DIAGNOSIS — M7989 Other specified soft tissue disorders: Secondary | ICD-10-CM

## 2013-08-11 DIAGNOSIS — L0390 Cellulitis, unspecified: Secondary | ICD-10-CM

## 2013-08-11 LAB — CBC
HCT: 36.4 % (ref 36.0–46.0)
MCV: 85.2 fL (ref 78.0–100.0)
RDW: 14.7 % (ref 11.5–15.5)
WBC: 5.8 10*3/uL (ref 4.0–10.5)

## 2013-08-11 LAB — URINALYSIS, ROUTINE W REFLEX MICROSCOPIC
Bilirubin Urine: NEGATIVE
Glucose, UA: NEGATIVE mg/dL
Hgb urine dipstick: NEGATIVE
Specific Gravity, Urine: 1.015 (ref 1.005–1.030)
Urobilinogen, UA: 0.2 mg/dL (ref 0.0–1.0)
pH: 6 (ref 5.0–8.0)

## 2013-08-11 LAB — BASIC METABOLIC PANEL
BUN: 7 mg/dL (ref 6–23)
Chloride: 100 mEq/L (ref 96–112)
Creatinine, Ser: 0.67 mg/dL (ref 0.50–1.10)
GFR calc Af Amer: >90 mL/min (ref >90)
Glucose, Bld: 92 mg/dL (ref 70–99)

## 2013-08-11 LAB — POCT I-STAT TROPONIN I

## 2013-08-11 MED ORDER — DIAZEPAM 5 MG PO TABS
5.0000 mg | ORAL_TABLET | Freq: Once | ORAL | Status: AC
  Administered 2013-08-11: 5 mg via ORAL
  Filled 2013-08-11: qty 1

## 2013-08-11 MED ORDER — HYDROMORPHONE HCL PF 1 MG/ML IJ SOLN
1.0000 mg | Freq: Once | INTRAMUSCULAR | Status: AC
  Administered 2013-08-11: 1 mg via INTRAVENOUS
  Filled 2013-08-11: qty 1

## 2013-08-11 MED ORDER — CLINDAMYCIN HCL 300 MG PO CAPS
450.0000 mg | ORAL_CAPSULE | Freq: Once | ORAL | Status: AC
  Administered 2013-08-11: 10:00:00 450 mg via ORAL
  Filled 2013-08-11: qty 1

## 2013-08-11 MED ORDER — IOHEXOL 300 MG/ML  SOLN
75.0000 mL | Freq: Once | INTRAMUSCULAR | Status: AC | PRN
  Administered 2013-08-11: 75 mL via INTRAVENOUS

## 2013-08-11 MED ORDER — ONDANSETRON 4 MG PO TBDP: 4.0000 mg | ORAL_TABLET | Freq: Once | ORAL | Status: DC

## 2013-08-11 MED ORDER — CLINDAMYCIN HCL 150 MG PO CAPS: 450.0000 mg | ORAL_CAPSULE | Freq: Three times a day (TID) | ORAL | Status: DC

## 2013-08-11 MED ORDER — ONDANSETRON HCL 4 MG/2ML IJ SOLN
4.0000 mg | Freq: Once | INTRAMUSCULAR | Status: AC
  Administered 2013-08-11: 4 mg via INTRAVENOUS
  Filled 2013-08-11: qty 2

## 2013-08-11 MED ORDER — DIAZEPAM 5 MG PO TABS: 5.0000 mg | ORAL_TABLET | Freq: Once | ORAL | Status: DC

## 2013-08-11 MED ORDER — HYDROMORPHONE HCL PF 1 MG/ML IJ SOLN: 1.0000 mg | Freq: Once | INTRAMUSCULAR | Status: DC

## 2013-08-11 NOTE — ED Notes (Signed)
Dr. Gwendolyn Grant speaking with pt, pt's neck tender to palpation, limited exam d/t guarding and resistance, RN present.

## 2013-08-11 NOTE — ED Notes (Signed)
Pt reports neck pain that began this a.m. - pt also admits to productive cough w/ green sputum x1 week and fatigue x2 weeks pt states "I just get up and eat then go back to bed."

## 2013-08-11 NOTE — ED Notes (Signed)
Pt to CT

## 2013-08-11 NOTE — ED Provider Notes (Signed)
CSN: [id number]     Arrival date & time 08/10/13  2028 History   First MD Initiated Contact with Patient 08/11/13 0024     Chief Complaint  Patient presents with  . Neck Pain   (Consider location/radiation/quality/duration/timing/severity/associated sxs/prior Treatment) HPI Comments: Patient awoke with neck pain. Pain did not wake her up from sleep. States fatigue for past 2 weeks. Productive cough also. Denies CP, N/V/D. No fevers. No abdominal pain.  Record review shows office visit with PCP several months ago chronicling her neck pain and refusal to take steroids.  Patient is a 79 y.o. female presenting with neck pain. The history is provided by the patient.  Neck Pain Pain location:  Generalized neck Quality:  Aching Pain radiates to:  Does not radiate Pain severity:  Moderate Pain is:  Same all the time Onset quality:  Sudden Duration:  1 day Timing:  Constant Chronicity:  Chronic Context: not fall and not recent injury   Relieved by:  Nothing Worsened by:  Nothing tried Ineffective treatments:  None tried Associated symptoms: no chest pain, no fever, no syncope and no visual change     Past Medical History  Diagnosis Date  . Allergy     allergic rhinitis  . Anemia     nos  . Depression   . Diabetes mellitus     type II, diet controlled  . Hyperlipidemia   . Hypertension   . Arthritis     osteoarthritis  . Stroke   . History of solitary pulmonary nodule     right, stable by CT  . Diverticulosis   . Fibromyalgia   . IBS (irritable bowel syndrome)   . GERD (gastroesophageal reflux disease)   . Atypical chest pain 09/2008    negative myoview  . Leukopenia     chronic  . Chronic sinusitis   . Routine general medical examination at a health care facility   . VENOUS INSUFFICIENCY 05/03/2008    Qualifier: Diagnosis of  By: Renaldo Fiddler CMA, Marliss Czar    . UNSTEADY GAIT 07/28/2009    Qualifier: Diagnosis of  By: Nena Jordan   . THROMBOCYTOPENIA 02/09/2009    Qualifier:  Diagnosis of  By: Nena Jordan   . SCIATICA, RIGHT 05/06/2008    Qualifier: Diagnosis of  By: Riley Lam, CMA, Sarah    . PULMONARY NODULE 12/26/2007    Qualifier: Diagnosis of  By: Nena Jordan   . Lonna Duval NOS-Unspec 06/21/2007    Centricity Description: DEGENERATIVE JOINT DISEASE, MILD Qualifier: Diagnosis of  By: Nena Jordan  Centricity Description: OSTEOARTHRITIS Qualifier: Diagnosis of  By: Nena Jordan   . NUMBNESS 01/29/2008    Qualifier: Diagnosis of  By: Everardo All MD, Cleophas Dunker   . NECK PAIN 02/03/2008    Qualifier: Diagnosis of  By: Nena Jordan   . LEUKOPENIA, CHRONIC 06/21/2007    Qualifier: Diagnosis of  By: Nena Jordan   . LEG PAIN, RIGHT 02/01/2009    Qualifier: Diagnosis of  By: Nena Jordan   . INSOMNIA, CHRONIC 07/19/2009    Qualifier: Diagnosis of  By: Nena Jordan   . IBS 06/21/2007    Qualifier: Diagnosis of  By: Nena Jordan   . HYPOTHYROIDISM 07/22/2008    Qualifier: Diagnosis of  By: Nena Jordan   . HYPOGLYCEMIA 10/24/2010    Qualifier: Diagnosis of  By: Nena Jordan   . HYPERTENSION  06/21/2007    Qualifier: Diagnosis of  By: Nena Jordan   . HYPERLIPIDEMIA 06/21/2007    Qualifier: Diagnosis of  By: Nena Jordan   . Headache 03/15/2011  . GERD 05/03/2008    Qualifier: Diagnosis of  By: Renaldo Fiddler CMA, Marliss Czar    . GASTRITIS, HX OF 08/25/2008    Qualifier: Diagnosis of  By: Nelson-Smith CMA (AAMA), Dottie    . FIBROMYALGIA 06/21/2007    Qualifier: Diagnosis of  By: Nena Jordan   . DIVERTICULOSIS-COLON 06/09/2008    Qualifier: Diagnosis of  By: Wilmon Pali NP, Gunnar Fusi    . DEPRESSION 06/21/2007    Qualifier: Diagnosis of  By: Nena Jordan   . CONSTIPATION, CHRONIC 09/20/2009    Qualifier: Diagnosis of  By: Juanda Chance MD, Hedwig Morton   . CHEST PAIN, ATYPICAL 09/02/2008    Qualifier: Diagnosis of  By: Nena Jordan   . CEREBROVASCULAR ACCIDENT, HX OF 06/21/2007    Qualifier: Diagnosis of  By: Nena Jordan   .  CEREBRAL ANEURYSM 06/21/2007    Qualifier: History of  By: Nena Jordan   . BACK PAIN, LUMBAR 03/06/2010    Qualifier: Diagnosis of  By: Nena Jordan   . ANXIETY 06/21/2007    Qualifier: Diagnosis of  By: Nena Jordan   . ANEMIA-NOS 06/21/2007    Qualifier: Diagnosis of  By: Nena Jordan   . ALLERGIC RHINITIS 06/21/2007    Qualifier: Diagnosis of  By: Nena Jordan    Past Surgical History  Procedure Laterality Date  . Appendectomy    . Cholecystectomy    . Abdominal hysterectomy    . Joint replacement      total right knee replacement  . Aneurysm coiling     Family History  Problem Relation Age of Onset  . Cancer Mother     pancreatic  . Coronary artery disease Father   . Cancer Sister      breast  . Asthma Sister   . Cancer Brother     prostate  . Allergies Brother   . Cancer Maternal Aunt     colon  . Diabetes Other   . Cancer Other     ovarian   History  Substance Use Topics  . Smoking status: Never Smoker   . Smokeless tobacco: Never Used  . Alcohol Use: No   OB History   Grav Para Term Preterm Abortions TAB SAB Ect Mult Living                 Review of Systems  Constitutional: Negative for fever.  HENT: Positive for ear pain (R ear) and neck pain.   Cardiovascular: Negative for chest pain and syncope.  All other systems reviewed and are negative.    Allergies  Amitriptyline hcl; Aspirin; Codeine; Escitalopram oxalate; Hydrocodone-acetaminophen; Latex; Lorazepam; Oxycodone-acetaminophen; Terfenadine; and Tramadol  Home Medications   Current Outpatient Rx  Name  Route  Sig  Dispense  Refill  . ALPRAZolam (XANAX) 0.5 MG tablet   Oral   Take 0.5 mg by mouth 3 (three) times daily as needed for sleep or anxiety.         . B Complex Vitamins (B COMPLEX 100 PO)   Oral   Take 1 tablet by mouth daily.           . Calcium Carbonate-Vitamin D (CALCIUM-VITAMIN D) 500-200 MG-UNIT per tablet   Oral  Take 1 tablet by mouth 3  (three) times daily.           Marland Kitchen co-enzyme Q-10 30 MG capsule   Oral   Take 30 mg by mouth 3 (three) times daily.           Marland Kitchen gabapentin (NEURONTIN) 300 MG capsule   Oral   Take 1 capsule (300 mg total) by mouth 3 (three) times daily.   90 capsule   11   . Magnesium 500 MG TABS   Oral   Take 1 tablet by mouth daily.            BP 164/75  Pulse 77  Temp(Src) 97.9 F (36.6 C) (Oral)  Resp 16  SpO2 100% Physical Exam  Nursing note and vitals reviewed. Constitutional: She is oriented to person, place, and time. She appears well-developed and well-nourished. No distress.  HENT:  Head: Normocephalic and atraumatic.  Right Ear: Tympanic membrane normal.  Left Ear: Tympanic membrane normal.  Eyes: EOM are normal. Pupils are equal, round, and reactive to light.  Neck: Normal range of motion. Neck supple.  Tenderness on palpation of posterior neck muscles  Cardiovascular: Normal rate and regular rhythm.  Exam reveals no friction rub.   No murmur heard. Pulmonary/Chest: Effort normal and breath sounds normal. No respiratory distress. She has no wheezes. She has no rales.  Abdominal: Soft. She exhibits no distension. There is no tenderness. There is no rebound.  Musculoskeletal: Normal range of motion. She exhibits edema (RLE 2+).  Lymphadenopathy:    She has no cervical adenopathy.  Neurological: She is alert and oriented to person, place, and time.  Skin: She is not diaphoretic.    ED Course  Procedures (including critical care time) Labs Review Labs Reviewed  CBC  BASIC METABOLIC PANEL  LACTIC ACID, PLASMA  URINALYSIS, ROUTINE W REFLEX MICROSCOPIC   Imaging Review Dg Chest 2 View  08/11/2013   CLINICAL DATA:  Fatigue and neck pain. Productive cough.  EXAM: CHEST  2 VIEW  COMPARISON:  07/21/2009  FINDINGS: Mild cardiomegaly. No edema, infiltrate, effusion, or pneumothorax. Calcified right lower lobe pulmonary nodule with calcified right hilar lymph node. Findings  consistent with previous granulomatous infection. Right rotator cuff tendinopathy with a high riding humeral head. Cholecystectomy clips.  IMPRESSION: 1. No evidence of acute cardiopulmonary disease. 2. Remote granulomatous infection of the lung.   Electronically Signed   By: Tiburcio Pea   On: 08/11/2013 02:20   Ct Soft Tissue Neck W Contrast  08/11/2013   *RADIOLOGY REPORT*  Clinical Data: Stiffness  CT NECK WITH CONTRAST  Technique:  Multidetector CT imaging of the neck was performed with intravenous contrast.  Contrast: 75mL OMNIPAQUE IOHEXOL 300 MG/ML  SOLN  Comparison: Prior CT of the head from 06/13/2011  Findings: Sequelae of prior coil embolization of right MCA aneurysm are seen.  Otherwise, visualized portions of the brain are grossly unremarkable.  Orbits are within normal limits.  There is complete opacification of the right maxillary sinus with hyperdense secretions seen centrally, likely a superimposed fungal elements/desiccated secretions.  Scattered opacity is present within several right-sided ethmoidal air cells. Circumferential mucosal thickening is present within the right sphenoid sinus.  is hyperostosis of the right maxilla, consistent with chronic sinusitis.  The paranasal sinuses are otherwise clear.  The salivary glands, including the parotid glands and submandibular glands are within normal limits.  Evaluation of the oral cavity is somewhat limited due to streak artifact from dental amalgam.  The  oral cavity is grossly unremarkable.  Torus palatini is noted.  The hila seen tonsils, nasopharynx, and hypopharynx are within normal limits and symmetric in appearance.  The parapharyngeal fat is clean.  The lingual tonsils, vallecula, and epiglottis are normal.  The hypopharynx and larynx are normal.  True and false vocal cords are grossly normal.  1 cm thick calcified heterogeneous hypodense nodules present within the posterior aspect of the left thyroid lobe (series 2, image 88). Thyroid  is otherwise unremarkable.  Visualized portions of the upper mediastinum are within normal limits.  Partially visualized lungs are clear.  No pathologically enlarged cervical lymph nodes are identified.  No mass lesions or loculated fluid collections.  Normal intravascular enhancement is seen within the neck.  Mild multilevel degenerative disc disease is seen within the cervical spine, most prominent at the C5-6 level.  No worrisome lytic or blastic osseous lesions are identified.  IMPRESSION:  1.  No CT evidence of acute process identified within the neck. 2.  Findings compatible with chronic right-sided paranasal sinus disease 3.  Sequelae of prior coil embolization for right MCA aneurysm. 4.  1 cm left thyroid nodule, indeterminate.  Correlation with dedicated thyroid ultrasound could be performed as clinically indicated. 5.  Multilevel degenerative disc disease within the cervical spine, most evident at C5-6   Original Report Authenticated By: Rise Mu, M.D.    MDM   1. Right leg swelling   2. Neck pain   3. Cellulitis    89F presents with neck pain. Awoke with neck pain. Denies trauma. She states she thinks it due to her recent cold for past 2 weeks. 2 weeks of chills, sore throat, lymphadenopathy. Denies fevers, vomiting, headache, vision changes.  AFVSS here. Neck with tenderness on palpation, no gross lymphadenopathy, however neck pain diffusely through exam. Normal airway, patent, with swelling. TMs normal. Patient forgetful on exam. Lungs clear.  RLE extremity - 2+ edema, LLE normal. She has a knee brace on her R knee that doesn't seem too tight, will remove and see if edema improves. She is here late at night, unsure if we can get Doppler US for DVT at this time. Record review shows hx of chronic neck pain and hx of unilateral LE swelling (possibly due to knee brace). With hx of productive cough, fatigue - will check CXR, urine studies, basic labs, EKG. Labs all normal. Patient  states continued neck pain. She has a long list of allergies to pain meds. I went over our options, valium, flexeril, robaxin for spasm as she is having difficulty moving her neck. She refused and kept saying, "that won't work!" for each medication. On repeat exam, she again is difficult to examine because she pulls my hands away from her neck. She seems to have muscle spasm. She asked for dilaudid by name and kept referring to an "IV cocktail to help her pain". Dilaudid given without relief here, she kept asking for more doses after the first dose did not help. I scanned her neck with contrast since she was so stiff to ensure no RPA or PTA or other lymphadenopathy/abscess. CT negative. Patient still remains in pain. She finally relented to trying Valium. Will give small amount of valium and flexeril to go home with her. I instructed not to take meds together and she understood.  Awaiting RLE doppler US. Unable to obtain due to hour constrictions initially, however she was in the ED long enough until the next morning and able to obtain her doppler study.  I have reviewed all labs and imaging and considered them in my medical decision making.   Dagmar Hait, MD 08/12/13 [phone]

## 2013-08-11 NOTE — Progress Notes (Signed)
*  Preliminary Results* Right lower extremity venous duplex completed. Visualized veins of the right lower extremity are negative for deep vein thrombosis. There is no evidence of right Baker's cyst.  Preliminary results discussed with Dr. Bernette Mayers.  08/11/2013 9:38 AM  Gertie Fey, RVT, RDCS, RDMS

## 2013-08-11 NOTE — ED Notes (Addendum)
Pt alert, NAD, calm, interactive, skin W&D, resps e/u, speaking in clear complete sentences. C/o circumfrential neck pain, states, "difficult to describe", no dyspnea noted. No obvious swelling, bruising, discolorations, injury, markings or wounds. (Denies: nausea, sob, dizziness, fever, chills, palpitations, fluttering, numbness, tingling or other sx). Bilateral upper extremity CMS intact. Strength equal and strong. Neck pain worse with movement. H/o chronic pain and similar episodes. Pt of Dr. Gerlene Fee, has been on prednisone in last month, has declined epidural injections in the past.

## 2013-08-11 NOTE — ED Notes (Signed)
Pt updated about wait, EDP aware of pt's pain.

## 2013-08-11 NOTE — ED Provider Notes (Signed)
Doppler neg for DVT. Pt discharged with Abx for possible cellulitis per Dr. Tyson Alias plan.   Charles B. Bernette Mayers, MD 08/11/13 [phone]

## 2013-08-13 ENCOUNTER — Ambulatory Visit: Payer: Medicare Other | Admitting: Internal Medicine

## 2013-08-14 ENCOUNTER — Ambulatory Visit: Payer: Medicare Other | Admitting: Physical Medicine & Rehabilitation

## 2013-08-26 ENCOUNTER — Telehealth: Payer: Self-pay | Admitting: Internal Medicine

## 2013-08-26 NOTE — Telephone Encounter (Signed)
Ok for PT/OT

## 2013-08-26 NOTE — Telephone Encounter (Signed)
Left msg for Angela Wang giving verbal order for pt to continue physical therapy....ds,cma

## 2013-08-26 NOTE — Telephone Encounter (Signed)
Pt is out of the hospital since last Tues.  Raynelle Fanning needs verbal orders to continue physical therapy 2 times a week for 4 weeks starting this week.  If you call Julie's cell, you can leave a message with first and last name.

## 2013-08-27 ENCOUNTER — Telehealth: Payer: Self-pay | Admitting: *Deleted

## 2013-08-27 NOTE — Telephone Encounter (Signed)
Angela Wang called requesting OT twice a week for 4 weeks.

## 2013-08-27 NOTE — Telephone Encounter (Signed)
Ok for verbal order  °

## 2013-08-28 NOTE — Telephone Encounter (Signed)
Left message on VM advising of MDs message. 

## 2013-09-28 ENCOUNTER — Emergency Department (HOSPITAL_COMMUNITY)
Admission: EM | Admit: 2013-09-28 | Discharge: 2013-09-28 | Disposition: A | Discharge status: Home / Self Care | Payer: Medicare Other | Attending: Emergency Medicine | Admitting: Emergency Medicine

## 2013-09-28 ENCOUNTER — Encounter (HOSPITAL_COMMUNITY): Payer: Self-pay | Admitting: Emergency Medicine

## 2013-09-28 DIAGNOSIS — F411 Generalized anxiety disorder: Secondary | ICD-10-CM | POA: Insufficient documentation

## 2013-09-28 DIAGNOSIS — K219 Gastro-esophageal reflux disease without esophagitis: Secondary | ICD-10-CM | POA: Insufficient documentation

## 2013-09-28 DIAGNOSIS — F329 Major depressive disorder, single episode, unspecified: Secondary | ICD-10-CM | POA: Insufficient documentation

## 2013-09-28 DIAGNOSIS — Z8709 Personal history of other diseases of the respiratory system: Secondary | ICD-10-CM | POA: Insufficient documentation

## 2013-09-28 DIAGNOSIS — R609 Edema, unspecified: Secondary | ICD-10-CM | POA: Insufficient documentation

## 2013-09-28 DIAGNOSIS — Z79899 Other long term (current) drug therapy: Secondary | ICD-10-CM | POA: Insufficient documentation

## 2013-09-28 DIAGNOSIS — R51 Headache: Secondary | ICD-10-CM | POA: Insufficient documentation

## 2013-09-28 DIAGNOSIS — Z8669 Personal history of other diseases of the nervous system and sense organs: Secondary | ICD-10-CM | POA: Insufficient documentation

## 2013-09-28 DIAGNOSIS — IMO0001 Reserved for inherently not codable concepts without codable children: Secondary | ICD-10-CM | POA: Insufficient documentation

## 2013-09-28 DIAGNOSIS — G8929 Other chronic pain: Secondary | ICD-10-CM | POA: Insufficient documentation

## 2013-09-28 DIAGNOSIS — Z8673 Personal history of transient ischemic attack (TIA), and cerebral infarction without residual deficits: Secondary | ICD-10-CM | POA: Insufficient documentation

## 2013-09-28 DIAGNOSIS — M542 Cervicalgia: Secondary | ICD-10-CM | POA: Insufficient documentation

## 2013-09-28 DIAGNOSIS — Z8739 Personal history of other diseases of the musculoskeletal system and connective tissue: Secondary | ICD-10-CM | POA: Insufficient documentation

## 2013-09-28 DIAGNOSIS — F3289 Other specified depressive episodes: Secondary | ICD-10-CM | POA: Insufficient documentation

## 2013-09-28 DIAGNOSIS — I10 Essential (primary) hypertension: Secondary | ICD-10-CM | POA: Insufficient documentation

## 2013-09-28 DIAGNOSIS — E119 Type 2 diabetes mellitus without complications: Secondary | ICD-10-CM | POA: Insufficient documentation

## 2013-09-28 DIAGNOSIS — Z91040 Latex allergy status: Secondary | ICD-10-CM | POA: Insufficient documentation

## 2013-09-28 DIAGNOSIS — Z862 Personal history of diseases of the blood and blood-forming organs and certain disorders involving the immune mechanism: Secondary | ICD-10-CM | POA: Insufficient documentation

## 2013-09-28 MED ORDER — OXYCODONE-ACETAMINOPHEN 5-325 MG PO TABS: 1.0000 | ORAL_TABLET | Freq: Four times a day (QID) | ORAL | Status: DC | PRN

## 2013-09-28 MED ORDER — OXYCODONE-ACETAMINOPHEN 5-325 MG PO TABS
1.0000 | ORAL_TABLET | Freq: Once | ORAL | Status: AC
  Administered 2013-09-28: 1 via ORAL
  Filled 2013-09-28: qty 1

## 2013-09-28 NOTE — ED Provider Notes (Signed)
CSN: [id number]     Arrival date & time 09/28/13  1205 History   First MD Initiated Contact with Patient 09/28/13 1316     Chief Complaint  Patient presents with  . Neck Pain  . Headache   (Consider location/radiation/quality/duration/timing/severity/associated sxs/prior Treatment) HPI Pt with multiple medical problems including chronic neck pain reports she woke up this morning with moderate to severe L sided neck pain, aching, worse with movement, not associated with falls or injury. Not radiating into arm. No associated numbness or weakness. She has associated L sided headache as well.    Past Medical History  Diagnosis Date  . Allergy     allergic rhinitis  . Anemia     nos  . Depression   . Diabetes mellitus     type II, diet controlled  . Hyperlipidemia   . Hypertension   . Arthritis     osteoarthritis  . Stroke   . History of solitary pulmonary nodule     right, stable by CT  . Diverticulosis   . Fibromyalgia   . IBS (irritable bowel syndrome)   . GERD (gastroesophageal reflux disease)   . Atypical chest pain 09/2008    negative myoview  . Leukopenia     chronic  . Chronic sinusitis   . Routine general medical examination at a health care facility   . VENOUS INSUFFICIENCY 05/03/2008    Qualifier: Diagnosis of  By: Renaldo Fiddler CMA, Marliss Czar    . UNSTEADY GAIT 07/28/2009    Qualifier: Diagnosis of  By: Nena Jordan   . THROMBOCYTOPENIA 02/09/2009    Qualifier: Diagnosis of  By: Nena Jordan   . SCIATICA, RIGHT 05/06/2008    Qualifier: Diagnosis of  By: Riley Lam, CMA, Sarah    . PULMONARY NODULE 12/26/2007    Qualifier: Diagnosis of  By: Nena Jordan   . Lonna Duval NOS-Unspec 06/21/2007    Centricity Description: DEGENERATIVE JOINT DISEASE, MILD Qualifier: Diagnosis of  By: Nena Jordan  Centricity Description: OSTEOARTHRITIS Qualifier: Diagnosis of  By: Nena Jordan   . NUMBNESS 01/29/2008    Qualifier: Diagnosis of  By: Everardo All MD, Cleophas Dunker   . NECK PAIN  02/03/2008    Qualifier: Diagnosis of  By: Nena Jordan   . LEUKOPENIA, CHRONIC 06/21/2007    Qualifier: Diagnosis of  By: Nena Jordan   . LEG PAIN, RIGHT 02/01/2009    Qualifier: Diagnosis of  By: Nena Jordan   . INSOMNIA, CHRONIC 07/19/2009    Qualifier: Diagnosis of  By: Nena Jordan   . IBS 06/21/2007    Qualifier: Diagnosis of  By: Nena Jordan   . HYPOTHYROIDISM 07/22/2008    Qualifier: Diagnosis of  By: Nena Jordan   . HYPOGLYCEMIA 10/24/2010    Qualifier: Diagnosis of  By: Nena Jordan   . HYPERTENSION 06/21/2007    Qualifier: Diagnosis of  By: Nena Jordan   . HYPERLIPIDEMIA 06/21/2007    Qualifier: Diagnosis of  By: Nena Jordan   . Headache 03/15/2011  . GERD 05/03/2008    Qualifier: Diagnosis of  By: Renaldo Fiddler CMA, Marliss Czar    . GASTRITIS, HX OF 08/25/2008    Qualifier: Diagnosis of  By: Nelson-Smith CMA (AAMA), Dottie    . FIBROMYALGIA 06/21/2007    Qualifier: Diagnosis of  By: Nena Jordan   . DIVERTICULOSIS-COLON 06/09/2008  Qualifier: Diagnosis of  By: Wilmon Pali NP, Gunnar Fusi    . DEPRESSION 06/21/2007    Qualifier: Diagnosis of  By: Nena Jordan   . CONSTIPATION, CHRONIC 09/20/2009    Qualifier: Diagnosis of  By: Juanda Chance MD, Hedwig Morton   . CHEST PAIN, ATYPICAL 09/02/2008    Qualifier: Diagnosis of  By: Nena Jordan   . CEREBROVASCULAR ACCIDENT, HX OF 06/21/2007    Qualifier: Diagnosis of  By: Nena Jordan   . CEREBRAL ANEURYSM 06/21/2007    Qualifier: History of  By: Nena Jordan   . BACK PAIN, LUMBAR 03/06/2010    Qualifier: Diagnosis of  By: Nena Jordan   . ANXIETY 06/21/2007    Qualifier: Diagnosis of  By: Nena Jordan   . ANEMIA-NOS 06/21/2007    Qualifier: Diagnosis of  By: Nena Jordan   . ALLERGIC RHINITIS 06/21/2007    Qualifier: Diagnosis of  By: Nena Jordan    Past Surgical History  Procedure Laterality Date  . Appendectomy    . Cholecystectomy    . Abdominal hysterectomy    . Joint  replacement      total right knee replacement  . Aneurysm coiling     Family History  Problem Relation Age of Onset  . Cancer Mother     pancreatic  . Coronary artery disease Father   . Cancer Sister      breast  . Asthma Sister   . Cancer Brother     prostate  . Allergies Brother   . Cancer Maternal Aunt     colon  . Diabetes Other   . Cancer Other     ovarian   History  Substance Use Topics  . Smoking status: Never Smoker   . Smokeless tobacco: Never Used  . Alcohol Use: No   OB History   Grav Para Term Preterm Abortions TAB SAB Ect Mult Living                 Review of Systems All other systems reviewed and are negative except as noted in HPI.   Allergies  Amitriptyline hcl; Aspirin; Codeine; Escitalopram oxalate; Hydrocodone-acetaminophen; Latex; Lorazepam; Oxycodone-acetaminophen; Terfenadine; and Tramadol  Home Medications   Current Outpatient Rx  Name  Route  Sig  Dispense  Refill  . ALPRAZolam (XANAX) 0.5 MG tablet   Oral   Take 0.5 mg by mouth 3 (three) times daily as needed for sleep or anxiety.         . B Complex Vitamins (B COMPLEX 100 PO)   Oral   Take 1 tablet by mouth daily.           . Calcium Carbonate-Vitamin D (CALCIUM-VITAMIN D) 500-200 MG-UNIT per tablet   Oral   Take 1 tablet by mouth 3 (three) times daily.           Marland Kitchen co-enzyme Q-10 30 MG capsule   Oral   Take 30 mg by mouth 3 (three) times daily.           . diazepam (VALIUM) 5 MG tablet   Oral   Take 1 tablet (5 mg total) by mouth once.   20 tablet   0   . gabapentin (NEURONTIN) 300 MG capsule   Oral   Take 1 capsule (300 mg total) by mouth 3 (three) times daily.   90 capsule   11   . Magnesium 500 MG TABS  Oral   Take 1 tablet by mouth daily.            Vitals reviewed on Monitor, not yet documented for this visit Physical Exam  Nursing note and vitals reviewed. Constitutional: She is oriented to person, place, and time. She appears well-developed and  well-nourished.  HENT:  Head: Normocephalic and atraumatic.  Eyes: EOM are normal. Pupils are equal, round, and reactive to light.  Neck: Normal range of motion. Neck supple.  Cardiovascular: Normal rate, normal heart sounds and intact distal pulses.   Pulmonary/Chest: Effort normal and breath sounds normal.  Abdominal: Bowel sounds are normal. She exhibits no distension. There is no tenderness.  Musculoskeletal: Normal range of motion. She exhibits edema (mild bilateral LE edema) and tenderness (tender over the L cervical paraspinal muscles).  Neurological: She is alert and oriented to person, place, and time. She has normal strength. No cranial nerve deficit or sensory deficit.  Skin: Skin is warm and dry. No rash noted.  Psychiatric: She has a normal mood and affect.    ED Course  Procedures (including critical care time) Labs Review Labs Reviewed - No data to display Imaging Review No results found.  EKG Interpretation   None       MDM  No diagnosis found.  Pt with exacerbation of chronic neck pain. She has Tramadol Rx by her PCP at [facility] but states she cannot tolerate the side effects. She has side effects from many different pain medications over the years but states Percocet tends to be less severe. No indication for labs or imaging today. Advised close PCP follow up for long term pain control.    Tashica Provencio B. Bernette Mayers, MD 09/28/13 1406

## 2013-09-28 NOTE — ED Notes (Signed)
Pt states woke up this morning with neck pain and a headache, states hx of same;

## 2013-10-02 DIAGNOSIS — Z96659 Presence of unspecified artificial knee joint: Secondary | ICD-10-CM | POA: Insufficient documentation

## 2013-10-02 DIAGNOSIS — T8484XA Pain due to internal orthopedic prosthetic devices, implants and grafts, initial encounter: Secondary | ICD-10-CM | POA: Insufficient documentation

## 2014-07-01 ENCOUNTER — Telehealth: Payer: Self-pay | Admitting: *Deleted

## 2014-07-01 NOTE — Telephone Encounter (Signed)
Called patient to r/s appointment from 07/13/14 with Np CM, offered first available for 07/02/14, patient refused due to recent surgery, appointment was r/s to 10/07/14 at 11 am with NP CM.

## 2014-07-13 ENCOUNTER — Ambulatory Visit: Payer: Medicare Other | Admitting: Nurse Practitioner

## 2014-09-16 ENCOUNTER — Other Ambulatory Visit: Payer: Self-pay

## 2014-09-16 DIAGNOSIS — Z1231 Encounter for screening mammogram for malignant neoplasm of breast: Secondary | ICD-10-CM

## 2014-10-04 ENCOUNTER — Ambulatory Visit
Admission: RE | Admit: 2014-10-04 | Discharge: 2014-10-04 | Disposition: A | Discharge status: Home / Self Care | Payer: Medicare Other | Source: Ambulatory Visit

## 2014-10-04 ENCOUNTER — Encounter (INDEPENDENT_AMBULATORY_CARE_PROVIDER_SITE_OTHER): Payer: Self-pay

## 2014-10-04 DIAGNOSIS — Z1231 Encounter for screening mammogram for malignant neoplasm of breast: Secondary | ICD-10-CM

## 2014-10-07 ENCOUNTER — Ambulatory Visit: Payer: Self-pay | Admitting: Adult Health

## 2014-12-07 ENCOUNTER — Ambulatory Visit (INDEPENDENT_AMBULATORY_CARE_PROVIDER_SITE_OTHER): Payer: 59 | Admitting: Adult Health

## 2014-12-07 ENCOUNTER — Encounter: Payer: Self-pay | Admitting: Adult Health

## 2014-12-07 VITALS — BP 151/83 | HR 74 | Temp 98.3°F | Ht 66.0 in | Wt 158.0 lb

## 2014-12-07 DIAGNOSIS — G8929 Other chronic pain: Secondary | ICD-10-CM

## 2014-12-07 DIAGNOSIS — M542 Cervicalgia: Secondary | ICD-10-CM

## 2014-12-07 DIAGNOSIS — R51 Headache: Secondary | ICD-10-CM

## 2014-12-07 NOTE — Patient Instructions (Signed)
Continue Gabapentin.  If your symptoms worsen or you develop new symptoms let us know.

## 2014-12-07 NOTE — Progress Notes (Signed)
PATIENT: Angela Wang DOB: [birth year]  REASON FOR VISIT: follow up- headache, neck pain  HISTORY FROM: patient  HISTORY OF PRESENT ILLNESS: Angela Wang is an 81 year old female with a history of neck pain and headaches. She returns today for follow-up. She is currently taking gabapentin and tolerating it well. She states that her neck pain and headaches are controlled. She states that her neck will hurt if she gets a sinus  Infection or cold. Her neck will also hurt if she tries to left heavy objects.  Denies any changes in her gait or balance however she just had a knee replacement and uses a cane. Patient states that she had stroke in 1995 and she has residual numbness in the right side of the face and arm. She states right now she has a sinus infection and therefore her face on the right feels swollen due to the numbness. She states that she has some swollen lymph nodes currently but states that they always stay swollen?  HISTORY 07/10/13 (CM): [name], 81 year old female returns for followup. She was last seen in our office 07/15/12  She continues to have neck pain but has refused epidural steroids. She is ambulating with a cane, no falls in over a year.  She is driving without difficulty. She remains on gabapentin and she says her headaches and neck pain are well controlled. She has no tenderness over the temporal area bilaterally. She has hip pain,back pain,   knee pain and foot pain. She says she is seeing Dr. Hal Neer next week for evaluation of back pain. She has recently been on prednisone which caused  weight gain    PRIOR HPI: With history of stroke and aneurysm, status post coiling, depression and anxiety, here for evaluation of headache and neck pain since February 2012.  Patient describes progressive global headache with sharp and throbbing sensation.  It is associated with feeling of her complaints being swollen in her neck, neck stiffness and neck pain. Sudden movements aggravate her  neck pain. She denies any problems with her arms or legs. She has chronic right neck and right arm pain and weakness. She is history of shingles affecting this region.She denies nausea, vomiting, photophobia, phonophobia, vision changes with these headaches. She has tried Topamax, amitriptyline, Dilaudid, Flexeril, Valium, tramadol without relief.  She is into the emergency room in Feb and May 2012 without relief.  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 She has been  seen by neurology at [facility] and [facility]. Had MRI brain with [facility] and apparently no significant abnl. On gabapentin and percocet. Also was recommended to have neck episudral steroid injections, but she declined.  REVIEW OF SYSTEMS: Out of a complete 14 system review of symptoms, the patient complains only of the following symptoms, and all other reviewed systems are negative.  Facial swelling, runny nose, eye itching, cough, palpitations, murmur, constipation, frequent infections, joint pain, joint swelling, aching muscles, neck pain, neck stiffness, swollen lymph nodes, bruise/bleed easily, anemia, numbness, memory loss, nervous/anxious   ALLERGIES: Allergies  Allergen Reactions  . Amitriptyline Hcl     REACTION: throat swelling  . Aspirin     REACTION: stomach upset  . Codeine     REACTION: stomach upset  . Escitalopram Oxalate     REACTION: Hallucinations  . Hydrocodone-Acetaminophen     REACTION: stomach upset  . Latex  REACTION: rash  . Lorazepam     REACTION: Throat Swelling  . Oxycodone-Acetaminophen     REACTION: Nausea and upset stomach  . Terfenadine     REACTION: stomach upset  . Tramadol Nausea And Vomiting    HOME MEDICATIONS: Outpatient Prescriptions Prior to Visit  Medication Sig Dispense Refill  . ALPRAZolam (XANAX) 0.5 MG tablet Take 0.5 mg by mouth 3 (three) times  daily as needed for sleep or anxiety.    . B Complex Vitamins (B COMPLEX 100 PO) Take 1 tablet by mouth daily.      . Calcium Carbonate-Vitamin D (CALCIUM-VITAMIN D) 500-200 MG-UNIT per tablet Take 1 tablet by mouth 3 (three) times daily.      Marland Kitchen co-enzyme Q-10 30 MG capsule Take 30 mg by mouth 3 (three) times daily.      Marland Kitchen gabapentin (NEURONTIN) 300 MG capsule Take 1 capsule (300 mg total) by mouth 3 (three) times daily. 90 capsule 11  . Magnesium 500 MG TABS Take 1 tablet by mouth daily.      . diazepam (VALIUM) 5 MG tablet Take 1 tablet (5 mg total) by mouth once. (Patient not taking: Reported on 12/07/2014) 20 tablet 0  . oxyCODONE-acetaminophen (PERCOCET/ROXICET) 5-325 MG per tablet Take 1-2 tablets by mouth every 6 (six) hours as needed for pain. (Patient not taking: Reported on 12/07/2014) 20 tablet 0   No facility-administered medications prior to visit.    PAST MEDICAL HISTORY: Past Medical History  Diagnosis Date  . Allergy     allergic rhinitis  . Anemia     nos  . Depression   . Diabetes mellitus     type II, diet controlled  . Hyperlipidemia   . Hypertension   . Arthritis     osteoarthritis  . Stroke   . History of solitary pulmonary nodule     right, stable by CT  . Diverticulosis   . Fibromyalgia   . IBS (irritable bowel syndrome)   . GERD (gastroesophageal reflux disease)   . Atypical chest pain 09/2008    negative myoview  . Leukopenia     chronic  . Chronic sinusitis   . Routine general medical examination at a health care facility   . VENOUS INSUFFICIENCY 05/03/2008    Qualifier: Diagnosis of  By: Julien Girt CMA, Marliss Czar    . UNSTEADY GAIT 07/28/2009    Qualifier: Diagnosis of  By: Wynona Luna   . THROMBOCYTOPENIA 02/09/2009    Qualifier: Diagnosis of  By: Wynona Luna   . SCIATICA, RIGHT 05/06/2008    Qualifier: Diagnosis of  By: Nathaneil Canary, CMA, Sarah    . PULMONARY NODULE 12/26/2007    Qualifier: Diagnosis of  By: Wynona Luna   . Docia Furl  NOS-Unspec 06/21/2007    Centricity Description: DEGENERATIVE JOINT DISEASE, MILD Qualifier: Diagnosis of  By: Wynona Luna  Centricity Description: OSTEOARTHRITIS Qualifier: Diagnosis of  By: Wynona Luna   . NUMBNESS 01/29/2008    Qualifier: Diagnosis of  By: Loanne Drilling MD, Jacelyn Pi   . NECK PAIN 02/03/2008    Qualifier: Diagnosis of  By: Wynona Luna   . LEUKOPENIA, CHRONIC 06/21/2007    Qualifier: Diagnosis of  By: Wynona Luna   . LEG PAIN, RIGHT 02/01/2009    Qualifier: Diagnosis of  By: Wynona Luna   . INSOMNIA, CHRONIC 07/19/2009    Qualifier: Diagnosis of  By: Wynona Luna   .  IBS 06/21/2007    Qualifier: Diagnosis of  By: Wynona Luna   . HYPOTHYROIDISM 07/22/2008    Qualifier: Diagnosis of  By: Wynona Luna   . HYPOGLYCEMIA 10/24/2010    Qualifier: Diagnosis of  By: Wynona Luna   . HYPERTENSION 06/21/2007    Qualifier: Diagnosis of  By: Wynona Luna   . HYPERLIPIDEMIA 06/21/2007    Qualifier: Diagnosis of  By: Wynona Luna   . Headache 03/15/2011  . GERD 05/03/2008    Qualifier: Diagnosis of  By: Julien Girt CMA, Marliss Czar    . GASTRITIS, HX OF 08/25/2008    Qualifier: Diagnosis of  By: Nelson-Smith CMA (AAMA), Dottie    . FIBROMYALGIA 06/21/2007    Qualifier: Diagnosis of  By: Wynona Luna   . DIVERTICULOSIS-COLON 06/09/2008    Qualifier: Diagnosis of  By: Chester Holstein NP, Nevin Bloodgood    . DEPRESSION 06/21/2007    Qualifier: Diagnosis of  By: Wynona Luna   . CONSTIPATION, CHRONIC 09/20/2009    Qualifier: Diagnosis of  By: Olevia Perches MD, Lowella Bandy   . CHEST PAIN, ATYPICAL 09/02/2008    Qualifier: Diagnosis of  By: Wynona Luna   . CEREBROVASCULAR ACCIDENT, HX OF 06/21/2007    Qualifier: Diagnosis of  By: Wynona Luna   . CEREBRAL ANEURYSM 06/21/2007    Qualifier: History of  By: Wynona Luna   . BACK PAIN, LUMBAR 03/06/2010    Qualifier: Diagnosis of  By: Wynona Luna   . ANXIETY 06/21/2007    Qualifier: Diagnosis of  By: Wynona Luna   . ANEMIA-NOS 06/21/2007    Qualifier: Diagnosis of  By: Wynona Luna   . ALLERGIC RHINITIS 06/21/2007    Qualifier: Diagnosis of  By: Wynona Luna     PAST SURGICAL HISTORY: Past Surgical History  Procedure Laterality Date  . Appendectomy    . Cholecystectomy    . Abdominal hysterectomy    . Joint replacement      total right knee replacement  . Aneurysm coiling      FAMILY HISTORY: Family History  Problem Relation Age of Onset  . Cancer Mother     pancreatic  . Coronary artery disease Father   . Cancer Sister      breast  . Asthma Sister   . Cancer Brother     prostate  . Allergies Brother   . Cancer Maternal Aunt     colon  . Diabetes Other   . Cancer Other     ovarian    SOCIAL HISTORY: History   Social History  . Marital Status: Widowed    Spouse Name: N/A    Number of Children: 4  . Years of Education: N/A   Occupational History  . retired     [place] History Main Topics  . Smoking status: Never Smoker   . Smokeless tobacco: Never Used  . Alcohol Use: No  . Drug Use: No  . Sexual Activity: Not on file   Other Topics Concern  . Not on file   Social History Narrative      PHYSICAL EXAM  Filed Vitals:   12/07/14 1109  BP: 151/83  Pulse: 74  Temp: 98.3 F (36.8 C)  TempSrc: Oral  Height: 5\' 6"  (1.676 m)  Weight: 158 lb (71.668 kg)   Body mass index is 25.51 kg/(m^2).  Generalized: Well developed, in no acute distress   Neurological examination  Mentation: Alert oriented to time, place, history taking. Follows all commands speech and language fluent Cranial nerve II-XII: Pupils were equal round reactive to light. Extraocular movements were full, visual field were full on confrontational test. Facial sensation and strength were normal. Uvula tongue midline. Head turning and shoulder shrug  were normal and symmetric. Swollen lymph node noted in the cervical chain on the left. Motor: The motor  testing reveals 5 over 5 strength of all 4 extremities. Good symmetric motor tone is noted throughout. Mild discomfort on the right side of the neck when turning the head to the right.  Sensory: Sensory testing is intact to soft touch on all 4 extremities except decreased on the right arm due to prior stroke. No evidence of extinction is noted.  Coordination: Cerebellar testing reveals good finger-nose-finger and heel-to-shin bilaterally.  Gait and station: Gait is wide based. Uses a cane to ambulate. Tandem gait not attempted. Romberg is negative. No drift is seen.  Reflexes: Deep tendon reflexes are symmetric and normal bilaterally.   DIAGNOSTIC DATA (LABS, IMAGING, TESTING) - I reviewed patient records, labs, notes, testing and imaging myself where available.  Lab Results  Component Value Date   WBC 5.8 08/11/2013   HGB 11.6* 08/11/2013   HCT 36.4 08/11/2013   MCV 85.2 08/11/2013   PLT 199 08/11/2013      Component Value Date/Time   NA 136 08/11/2013 0215   K 4.4 08/11/2013 0215   CL 100 08/11/2013 0215   CO2 27 08/11/2013 0215   GLUCOSE 92 08/11/2013 0215   BUN 7 08/11/2013 0215   CREATININE 0.67 08/11/2013 0215   CALCIUM 9.6 08/11/2013 0215   PROT 7.4 06/29/2013 1024   ALBUMIN 3.8 06/29/2013 1024   AST 21 06/29/2013 1024   ALT 17 06/29/2013 1024   ALKPHOS 60 06/29/2013 1024   BILITOT 0.3 06/29/2013 1024   GFRNONAA 81* 08/11/2013 0215   GFRAA >90 08/11/2013 0215   Lab Results  Component Value Date   CHOL 174 06/29/2013   HDL 93.70 06/29/2013   LDLCALC 70 06/29/2013   TRIG 51.0 06/29/2013   CHOLHDL 2 06/29/2013   Lab Results  Component Value Date   HGBA1C 5.8 06/29/2013   Lab Results  Component Value Date   VITAMINB12 369 02/24/2009   Lab Results  Component Value Date   TSH 1.37 06/29/2013      ASSESSMENT AND PLAN 81 y.o. year old female  has a past medical history of Allergy; Anemia; Depression; Diabetes mellitus; Hyperlipidemia; Hypertension;  Arthritis; Stroke; History of solitary pulmonary nodule; Diverticulosis; Fibromyalgia; IBS (irritable bowel syndrome); GERD (gastroesophageal reflux disease); Atypical chest pain (09/2008); Leukopenia; Chronic sinusitis; Routine general medical examination at a health care facility; VENOUS INSUFFICIENCY (05/03/2008); UNSTEADY GAIT (07/28/2009); THROMBOCYTOPENIA (02/09/2009); SCIATICA, RIGHT (05/06/2008); PULMONARY NODULE (12/26/2007); Osteoarth NOS-Unspec (06/21/2007); NUMBNESS (01/29/2008); NECK PAIN (02/03/2008); LEUKOPENIA, CHRONIC (06/21/2007); LEG PAIN, RIGHT (02/01/2009); INSOMNIA, CHRONIC (07/19/2009); IBS (06/21/2007); HYPOTHYROIDISM (07/22/2008); HYPOGLYCEMIA (10/24/2010); HYPERTENSION (06/21/2007); HYPERLIPIDEMIA (06/21/2007); Headache (03/15/2011); GERD (05/03/2008); GASTRITIS, HX OF (08/25/2008); FIBROMYALGIA (06/21/2007); DIVERTICULOSIS-COLON (06/09/2008); DEPRESSION (06/21/2007); CONSTIPATION, CHRONIC (09/20/2009); CHEST PAIN, ATYPICAL (09/02/2008); CEREBROVASCULAR ACCIDENT, HX OF (06/21/2007); CEREBRAL ANEURYSM (06/21/2007); BACK PAIN, LUMBAR (03/06/2010); ANXIETY (06/21/2007); ANEMIA-NOS (06/21/2007); and ALLERGIC RHINITIS (06/21/2007). here with:  1. Headache 2. Neck pain  Overall the patient is doing about the same. She states that her neck pain and headache has been controlled with gabapentin. She does have triggers that will exacerbate the neck pain.  Her gabapentin was recently increased to 400 mg 3 times a day. She should continue taking gabapentin. She does note that she has swollen lymph nodes that are present consistently. I have encouraged the patient to follow-up with her primary care provider. If the patient's symptoms worsen or she develops new symptoms she should let us know. Otherwise she will follow up in 1 year or sooner if needed.   Ward Givens, MSN, NP-C 12/07/2014, 11:19 AM [facility] [address] [place], [place] [address] [phone]  Note: This document was prepared  with digital dictation and possible smart phrase technology. Any transcriptional errors that result from this process are unintentional.

## 2014-12-10 NOTE — Progress Notes (Signed)
I agree above plan. 

## 2015-03-30 ENCOUNTER — Encounter: Payer: Self-pay | Admitting: Internal Medicine

## 2015-03-30 ENCOUNTER — Encounter: Payer: Self-pay | Admitting: Gastroenterology

## 2015-09-16 ENCOUNTER — Other Ambulatory Visit: Payer: Self-pay

## 2015-09-16 DIAGNOSIS — Z1231 Encounter for screening mammogram for malignant neoplasm of breast: Secondary | ICD-10-CM

## 2015-11-01 ENCOUNTER — Ambulatory Visit
Admission: RE | Admit: 2015-11-01 | Discharge: 2015-11-01 | Disposition: A | Discharge status: Home / Self Care | Payer: Medicare Other | Source: Ambulatory Visit

## 2015-11-01 DIAGNOSIS — Z1231 Encounter for screening mammogram for malignant neoplasm of breast: Secondary | ICD-10-CM

## 2015-11-18 ENCOUNTER — Emergency Department (HOSPITAL_COMMUNITY): Payer: Medicare Other

## 2015-11-18 ENCOUNTER — Emergency Department (HOSPITAL_COMMUNITY)
Admission: EM | Admit: 2015-11-18 | Discharge: 2015-11-18 | Disposition: A | Discharge status: Home / Self Care | Payer: Medicare Other | Attending: Emergency Medicine | Admitting: Emergency Medicine

## 2015-11-18 ENCOUNTER — Encounter (HOSPITAL_COMMUNITY): Payer: Self-pay | Admitting: Emergency Medicine

## 2015-11-18 DIAGNOSIS — F419 Anxiety disorder, unspecified: Secondary | ICD-10-CM | POA: Insufficient documentation

## 2015-11-18 DIAGNOSIS — Z8673 Personal history of transient ischemic attack (TIA), and cerebral infarction without residual deficits: Secondary | ICD-10-CM | POA: Insufficient documentation

## 2015-11-18 DIAGNOSIS — I10 Essential (primary) hypertension: Secondary | ICD-10-CM | POA: Diagnosis not present

## 2015-11-18 DIAGNOSIS — Y998 Other external cause status: Secondary | ICD-10-CM | POA: Diagnosis not present

## 2015-11-18 DIAGNOSIS — Z862 Personal history of diseases of the blood and blood-forming organs and certain disorders involving the immune mechanism: Secondary | ICD-10-CM | POA: Diagnosis not present

## 2015-11-18 DIAGNOSIS — T148XXA Other injury of unspecified body region, initial encounter: Secondary | ICD-10-CM

## 2015-11-18 DIAGNOSIS — S4991XA Unspecified injury of right shoulder and upper arm, initial encounter: Secondary | ICD-10-CM | POA: Insufficient documentation

## 2015-11-18 DIAGNOSIS — Z91040 Latex allergy status: Secondary | ICD-10-CM | POA: Diagnosis not present

## 2015-11-18 DIAGNOSIS — S0990XA Unspecified injury of head, initial encounter: Secondary | ICD-10-CM | POA: Insufficient documentation

## 2015-11-18 DIAGNOSIS — Y9389 Activity, other specified: Secondary | ICD-10-CM | POA: Diagnosis not present

## 2015-11-18 DIAGNOSIS — F329 Major depressive disorder, single episode, unspecified: Secondary | ICD-10-CM | POA: Insufficient documentation

## 2015-11-18 DIAGNOSIS — M1990 Unspecified osteoarthritis, unspecified site: Secondary | ICD-10-CM | POA: Insufficient documentation

## 2015-11-18 DIAGNOSIS — S199XXA Unspecified injury of neck, initial encounter: Secondary | ICD-10-CM | POA: Diagnosis present

## 2015-11-18 DIAGNOSIS — E119 Type 2 diabetes mellitus without complications: Secondary | ICD-10-CM | POA: Insufficient documentation

## 2015-11-18 DIAGNOSIS — Y92410 Unspecified street and highway as the place of occurrence of the external cause: Secondary | ICD-10-CM | POA: Diagnosis not present

## 2015-11-18 DIAGNOSIS — Z79899 Other long term (current) drug therapy: Secondary | ICD-10-CM | POA: Insufficient documentation

## 2015-11-18 DIAGNOSIS — W19XXXA Unspecified fall, initial encounter: Secondary | ICD-10-CM

## 2015-11-18 MED ORDER — ACETAMINOPHEN-CODEINE #3 300-30 MG PO TABS: 1.0000 | ORAL_TABLET | Freq: Four times a day (QID) | ORAL | Status: DC | PRN
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
## 2015-11-18 NOTE — ED Notes (Signed)
Patient transported to X-ray 

## 2015-11-18 NOTE — Discharge Instructions (Signed)

## 2015-11-18 NOTE — ED Notes (Signed)
Family at bedside. 
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
## 2015-11-18 NOTE — ED Notes (Signed)
MD at bedside.KNott

## 2015-11-22 DIAGNOSIS — Z7409 Other reduced mobility: Secondary | ICD-10-CM | POA: Insufficient documentation

## 2015-12-08 ENCOUNTER — Ambulatory Visit (INDEPENDENT_AMBULATORY_CARE_PROVIDER_SITE_OTHER): Payer: Medicare Other | Admitting: Adult Health

## 2015-12-08 ENCOUNTER — Ambulatory Visit: Payer: 59 | Admitting: Adult Health

## 2015-12-08 ENCOUNTER — Encounter: Payer: Self-pay | Admitting: Adult Health

## 2015-12-08 VITALS — BP 124/76 | HR 88 | Ht 66.0 in | Wt 164.0 lb

## 2015-12-08 DIAGNOSIS — M542 Cervicalgia: Secondary | ICD-10-CM | POA: Diagnosis not present

## 2015-12-08 DIAGNOSIS — R51 Headache: Secondary | ICD-10-CM

## 2015-12-08 DIAGNOSIS — R519 Headache, unspecified: Secondary | ICD-10-CM

## 2015-12-08 DIAGNOSIS — G8929 Other chronic pain: Secondary | ICD-10-CM

## 2015-12-08 NOTE — Patient Instructions (Signed)
If your symptoms worsen or you develop new symptoms please let us know.   
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
## 2015-12-20 NOTE — Progress Notes (Signed)
I reviewed note and agree with plan.   Ripley Bogosian R. Brianna Esson, MD  Certified in Neurology, Neurophysiology and Neuroimaging  [facility] [address], [address] [place], [place] [address] [phone]   

## 2016-01-09 ENCOUNTER — Telehealth: Payer: Self-pay | Admitting: Neurology

## 2016-01-09 NOTE — Telephone Encounter (Signed)
I was given the ok to schedule pt with Dr. Leonie Man , per Dr. Leta Baptist. I called to schedule, however; pt was not in. Left a message with relative for her to call back. Thank you.

## 2016-01-10 DIAGNOSIS — Z961 Presence of intraocular lens: Secondary | ICD-10-CM | POA: Insufficient documentation

## 2016-01-10 DIAGNOSIS — D3122 Benign neoplasm of left retina: Secondary | ICD-10-CM | POA: Insufficient documentation

## 2016-01-10 DIAGNOSIS — H3581 Retinal edema: Secondary | ICD-10-CM | POA: Insufficient documentation

## 2016-01-10 NOTE — Telephone Encounter (Signed)
ok 

## 2016-05-09 DIAGNOSIS — I739 Peripheral vascular disease, unspecified: Secondary | ICD-10-CM | POA: Insufficient documentation

## 2016-05-09 DIAGNOSIS — J301 Allergic rhinitis due to pollen: Secondary | ICD-10-CM | POA: Insufficient documentation

## 2016-09-27 ENCOUNTER — Other Ambulatory Visit: Payer: Self-pay | Admitting: Obstetrics & Gynecology

## 2016-09-27 DIAGNOSIS — Z1231 Encounter for screening mammogram for malignant neoplasm of breast: Secondary | ICD-10-CM

## 2016-11-14 ENCOUNTER — Ambulatory Visit: Payer: Medicare Other

## 2016-12-10 ENCOUNTER — Ambulatory Visit (INDEPENDENT_AMBULATORY_CARE_PROVIDER_SITE_OTHER): Payer: Medicare Other | Admitting: Neurology

## 2016-12-10 ENCOUNTER — Encounter: Payer: Self-pay | Admitting: Neurology

## 2016-12-10 ENCOUNTER — Telehealth: Payer: Self-pay | Admitting: Neurology

## 2016-12-10 VITALS — BP 108/72 | HR 80 | Ht 65.0 in | Wt 170.0 lb

## 2016-12-10 DIAGNOSIS — I6529 Occlusion and stenosis of unspecified carotid artery: Secondary | ICD-10-CM

## 2016-12-10 DIAGNOSIS — E785 Hyperlipidemia, unspecified: Secondary | ICD-10-CM | POA: Diagnosis not present

## 2016-12-10 DIAGNOSIS — I6990 Unspecified sequelae of unspecified cerebrovascular disease: Secondary | ICD-10-CM | POA: Diagnosis not present

## 2016-12-10 NOTE — Patient Instructions (Addendum)
I had a long d/w patient about her remote strokes, risk for recurrent stroke/TIAs, personally independently reviewed imaging studies and stroke evaluation results and answered questions.. I strongly encouraged her to resume Plavix 75 mg daily  for secondary stroke prevention and maintain strict control of hypertension with blood pressure goal below 130/90, diabetes with hemoglobin A1c goal below 6.5% and lipids with LDL cholesterol goal below 70 mg/dL. I also advised the patient to eat a healthy diet with plenty of whole grains, cereals, fruits and vegetables, exercise regularly and maintain ideal body weight .check follow-up screening carotid and transcranial Doppler studies as well as fast acting lipid profile and hemoglobin A1c. I encouraged her to do regular neck stretching exercises for her musculoskeletal neck and shoulder pain. Followup in the future with me in one year or call earlier if necessary.   Neck Exercises Neck exercises can be important for many reasons:  They can help you to improve and maintain flexibility in your neck. This can be especially important as you age.  They can help to make your neck stronger. This can make movement easier.  They can reduce or prevent neck pain.  They may help your upper back. Ask your health care provider which neck exercises would be best for you. Exercises Neck Press  Repeat this exercise 10 times. Do it first thing in the morning and right before bed or as told by your health care provider. 1. Lie on your back on a firm bed or on the floor with a pillow under your head. 2. Use your neck muscles to push your head down on the pillow and straighten your spine. 3. Hold the position as well as you can. Keep your head facing up and your chin tucked. 4. Slowly count to 5 while holding this position. 5. Relax for a few seconds. Then repeat. Isometric Strengthening  Do a full set of these exercises 2 times a day or as told by your health care  provider. 1. Sit in a supportive chair and place your hand on your forehead. 2. Push forward with your head and neck while pushing back with your hand. Hold for 10 seconds. 3. Relax. Then repeat the exercise 3 times. 4. Next, do thesequence again, this time putting your hand against the back of your head. Use your head and neck to push backward against the hand pressure. 5. Finally, do the same exercise on either side of your head, pushing sideways against the pressure of your hand. Prone Head Lifts  Repeat this exercise 5 times. Do this 2 times a day or as told by your health care provider. 1. Lie face-down, resting on your elbows so that your chest and upper back are raised. 2. Start with your head facing downward, near your chest. Position your chin either on or near your chest. 3. Slowly lift your head upward. Lift until you are looking straight ahead. Then continue lifting your head as far back as you can stretch. 4. Hold your head up for 5 seconds. Then slowly lower it to your starting position. Supine Head Lifts  Repeat this exercise 8-10 times. Do this 2 times a day or as told by your health care provider. 1. Lie on your back, bending your knees to point to the ceiling and keeping your feet flat on the floor. 2. Lift your head slowly off the floor, raising your chin toward your chest. 3. Hold for 5 seconds. 4. Relax and repeat. Scapular Retraction  Repeat this exercise 5  times. Do this 2 times a day or as told by your health care provider. 1. Stand with your arms at your sides. Look straight ahead. 2. Slowly pull both shoulders backward and downward until you feel a stretch between your shoulder blades in your upper back. 3. Hold for 10-30 seconds. 4. Relax and repeat. Contact a health care provider if:  Your neck pain or discomfort gets much worse when you do an exercise.  Your neck pain or discomfort does not improve within 2 hours after you exercise. If you have any of these  problems, stop exercising right away. Do not do the exercises again unless your health care provider says that you can. Get help right away if:  You develop sudden, severe neck pain. If this happens, stop exercising right away. Do not do the exercises again unless your health care provider says that you can. Exercises Neck Stretch  Repeat this exercise 3-5 times. 1. Do this exercise while standing or while sitting in a chair. 2. Place your feet flat on the floor, shoulder-width apart. 3. Slowly turn your head to the right. Turn it all the way to the right so you can look over your right shoulder. Do not tilt or tip your head. 4. Hold this position for 10-30 seconds. 5. Slowly turn your head to the left, to look over your left shoulder. 6. Hold this position for 10-30 seconds. Neck Retraction  Repeat this exercise 8-10 times. Do this 3-4 times a day or as told by your health care provider. 1. Do this exercise while standing or while sitting in a sturdy chair. 2. Look straight ahead. Do not bend your neck. 3. Use your fingers to push your chin backward. Do not bend your neck for this movement. Continue to face straight ahead. If you are doing the exercise properly, you will feel a slight sensation in your throat and a stretch at the back of your neck. 4. Hold the stretch for 1-2 seconds. Relax and repeat. This information is not intended to replace advice given to you by your health care provider. Make sure you discuss any questions you have with your health care provider. Document Released: 10/24/2015 Document Revised: 04/19/2016 Document Reviewed: 05/23/2015 Elsevier Interactive Patient Education  2017 [employer].

## 2016-12-10 NOTE — Telephone Encounter (Signed)
Called Patient and left her a message asking her to call me back to schedule her Doppler.
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
## 2016-12-11 LAB — LIPID PANEL
CHOLESTEROL TOTAL: 174 mg/dL (ref 100–199)
Chol/HDL Ratio: 2.1 ratio units (ref 0.0–4.4)
HDL: 81 mg/dL (ref >39)
LDL CALC: 75 mg/dL (ref 0–99)
TRIGLYCERIDES: 90 mg/dL (ref 0–149)
VLDL Cholesterol Cal: 18 mg/dL (ref 5–40)

## 2016-12-11 LAB — HEMOGLOBIN A1C
ESTIMATED AVERAGE GLUCOSE: 126 mg/dL
HEMOGLOBIN A1C: 6 % — AB (ref 4.8–5.6)

## 2016-12-17 ENCOUNTER — Telehealth: Payer: Self-pay

## 2016-12-17 NOTE — Telephone Encounter (Signed)
-----   Message from Garvin Fila, MD sent at 12/13/2016  6:22 PM EST ----- Let patient know that lipid profile and screening test for diabetes were  Borderline but satisfactory

## 2016-12-17 NOTE — Telephone Encounter (Signed)
Notes Recorded by Marval Regal, RN on 12/17/2016 at 10:54 AM EST Rn call patient about her hemoglobin a1c. Rn stated her lipids and screening test were borderline but satisfactory. Pt stated she has been borderline all of her life. Pt verbalized understanding of lab work. ------

## 2016-12-19 ENCOUNTER — Other Ambulatory Visit: Payer: Medicare Other

## 2016-12-20 ENCOUNTER — Other Ambulatory Visit: Payer: Self-pay | Admitting: Neurology

## 2016-12-20 ENCOUNTER — Telehealth: Payer: Self-pay

## 2016-12-20 MED ORDER — CLOPIDOGREL BISULFATE 75 MG PO TABS: 75.0000 mg | ORAL_TABLET | Freq: Every day | ORAL | 11 refills | Status: DC

## 2016-12-20 NOTE — Telephone Encounter (Signed)
Rn call patient about her plavix. Rn stated the prescription was sent to [facility] in [place]. Rn stated she takes one pill daily. Pt verbalized understanding.

## 2017-01-02 ENCOUNTER — Other Ambulatory Visit: Payer: Medicare Other

## 2017-01-22 ENCOUNTER — Ambulatory Visit: Payer: Medicare Other

## 2017-02-05 ENCOUNTER — Ambulatory Visit
Admission: RE | Admit: 2017-02-05 | Discharge: 2017-02-05 | Disposition: A | Discharge status: Home / Self Care | Payer: Medicare Other | Source: Ambulatory Visit | Attending: Obstetrics & Gynecology | Admitting: Obstetrics & Gynecology

## 2017-02-05 DIAGNOSIS — Z1231 Encounter for screening mammogram for malignant neoplasm of breast: Secondary | ICD-10-CM

## 2017-10-21 ENCOUNTER — Telehealth (INDEPENDENT_AMBULATORY_CARE_PROVIDER_SITE_OTHER): Payer: Self-pay | Admitting: Orthopaedic Surgery

## 2017-10-21 NOTE — Telephone Encounter (Signed)
Patient has upcoming appointment with Dr. Erlinda Hong and has had surgery on the knee that she is going to be seen for but does not have access to her records and was wondering if her doctor could be called to get the notes. Dr. Kerry Dory [phone]

## 2017-10-23 NOTE — Telephone Encounter (Signed)
Called patient back regarding this. We will need notes faxed to [place]. She states she had SU elsewhere. Advised her they may want her to sign or give verbal consent in order to fax [place] info/notes/etc She will call them to get notes she said.

## 2017-10-28 ENCOUNTER — Ambulatory Visit (INDEPENDENT_AMBULATORY_CARE_PROVIDER_SITE_OTHER): Payer: Medicare Other | Admitting: Orthopaedic Surgery

## 2017-10-28 ENCOUNTER — Encounter (INDEPENDENT_AMBULATORY_CARE_PROVIDER_SITE_OTHER): Payer: Self-pay | Admitting: Orthopaedic Surgery

## 2017-10-28 ENCOUNTER — Ambulatory Visit (INDEPENDENT_AMBULATORY_CARE_PROVIDER_SITE_OTHER): Payer: Medicare Other

## 2017-10-28 DIAGNOSIS — Z96651 Presence of right artificial knee joint: Secondary | ICD-10-CM

## 2017-10-28 MED ORDER — ACETAMINOPHEN-CODEINE #3 300-30 MG PO TABS: 1.0000 | ORAL_TABLET | Freq: Three times a day (TID) | ORAL | 0 refills | Status: DC | PRN

## 2017-10-28 NOTE — Progress Notes (Signed)
Office Visit Note   Patient: Angela Wang           Date of Birth: [birth year]           MRN: [id number] Visit Date: 10/28/2017              Requested by: [place], [place], [place] [address] [place] [place], [place] [address] PCP: Belva Bertin, [place], [place]   Assessment & Plan: Visit Diagnoses:  1. History of right knee joint replacement     Plan: Impression is right knee pain status post revision in 2015.  We will obtain blood work to evaluate for any evidence of infection to begin with.  X-rays overall appear to be stable and without complication.  Prescription for Tylenol #3.  Follow-up in 1 week.  Follow-Up Instructions: Return in about 1 week (around 11/04/2017).   Orders:  Orders Placed This Encounter  Procedures  . XR KNEE 3 VIEW RIGHT  . CBC with Differential  . C-reactive protein  . Sed Rate (ESR)   Meds ordered this encounter  Medications  . acetaminophen-codeine (TYLENOL #3) 300-30 MG tablet    Sig: Take 1-2 tablets by mouth every 8 (eight) hours as needed for moderate pain.    Dispense:  30 tablet    Refill:  0      Procedures: No procedures performed   Clinical Data: No additional findings.   Subjective: Chief Complaint  Patient presents with  . 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 Al Corpus at [facility]  She has been complaining of one month history of constant pain is worse with ambulation.  She is ambulating with a cane.  She does endorse swelling of the right knee but denies any constitutional symptoms or fevers or chills.  Denies any injuries.  Denies any numbness and tingling.    Review of Systems  Constitutional: Negative.   HENT: Negative.   Eyes: Negative.   Respiratory: Negative.   Cardiovascular: Negative.   Endocrine: Negative.   Musculoskeletal: Negative.   Neurological: Negative.   Hematological: Negative.     Psychiatric/Behavioral: Negative.   All other systems reviewed and are negative.    Objective: Vital Signs: There were no vitals taken for this visit.  Physical Exam  Constitutional: She is oriented to person, place, and time. She appears well-developed and well-nourished.  HENT:  Head: Normocephalic and atraumatic.  Eyes: EOM are normal.  Neck: Neck supple.  Pulmonary/Chest: Effort normal.  Abdominal: Soft.  Neurological: She is alert and oriented to person, place, and time.  Skin: Skin is warm. Capillary refill takes less than 2 seconds.  Psychiatric: She has a normal mood and affect. Her behavior is normal. Judgment and thought content normal.  Nursing note and vitals reviewed.   Ortho Exam Right knee exam shows a fully healed surgical scar.  No obvious evidence of effusion.  Skin is just slightly warm to touch but no evidence of cellulitis or erythema.  Collaterals are intact.  No significant pain of the patellofemoral joint with knee flexion. Specialty Comments:  No specialty comments available.  Imaging: Xr Knee 3 View Right  Result Date: 10/28/2017 Status post total knee revision without obvious signs of loosening or complication    PMFS History: Patient Active Problem List   Diagnosis Date Noted  . Cerebral embolism with cerebral infarction ([place]) 07/10/2013  . Headache(784.0)  03/15/2011  . HYPOGLYCEMIA 10/24/2010  . BACK PAIN, LUMBAR 03/06/2010  . CONSTIPATION, CHRONIC 09/20/2009  . SINUSITIS, CHRONIC 08/16/2009  . UNSTEADY GAIT 07/28/2009  . INSOMNIA, CHRONIC 07/19/2009  . THROMBOCYTOPENIA 02/09/2009  . LEG PAIN, RIGHT 02/01/2009  . Other abnormal glucose 02/01/2009  . CHEST PAIN, ATYPICAL 09/02/2008  . GASTRITIS, HX OF 08/25/2008  . HYPOTHYROIDISM 07/22/2008  . DIVERTICULOSIS-COLON 06/09/2008  . SCIATICA, RIGHT 05/06/2008  . VENOUS INSUFFICIENCY 05/03/2008  . GERD 05/03/2008  . NECK PAIN 02/03/2008  . NUMBNESS 01/29/2008  . PULMONARY NODULE  12/26/2007  . HYPERLIPIDEMIA 06/21/2007  . ANEMIA-NOS 06/21/2007  . LEUKOPENIA, CHRONIC 06/21/2007  . ANXIETY 06/21/2007  . DEPRESSION 06/21/2007  . HYPERTENSION 06/21/2007  . CEREBRAL ANEURYSM 06/21/2007  . ALLERGIC RHINITIS 06/21/2007  . IBS 06/21/2007  . Osteoarthrosis, unspecified whether generalized or localized, unspecified site 06/21/2007  . FIBROMYALGIA 06/21/2007  . CEREBROVASCULAR ACCIDENT, HX OF 06/21/2007   Past Medical History:  Diagnosis Date  . ALLERGIC RHINITIS 06/21/2007   Qualifier: Diagnosis of  By: Wynona Luna   . Allergy    allergic rhinitis  . Anemia    nos  . ANEMIA-NOS 06/21/2007   Qualifier: Diagnosis of  By: Wynona Luna   . ANXIETY 06/21/2007   Qualifier: Diagnosis of  By: Wynona Luna   . Arthritis    osteoarthritis  . Atypical chest pain 09/2008   negative myoview  . BACK PAIN, LUMBAR 03/06/2010   Qualifier: Diagnosis of  By: Wynona Luna   . CEREBRAL ANEURYSM 06/21/2007   Qualifier: History of  By: Wynona Luna   . CEREBROVASCULAR ACCIDENT, HX OF 06/21/2007   Qualifier: Diagnosis of  By: Wynona Luna   . CHEST PAIN, ATYPICAL 09/02/2008   Qualifier: Diagnosis of  By: Wynona Luna   . Chronic sinusitis   . CONSTIPATION, CHRONIC 09/20/2009   Qualifier: Diagnosis of  By: Olevia Perches MD, Lowella Bandy   . Depression   . DEPRESSION 06/21/2007   Qualifier: Diagnosis of  By: Wynona Luna   . Diverticulosis   . DIVERTICULOSIS-COLON 06/09/2008   Qualifier: Diagnosis of  By: Chester Holstein NP, Nevin Bloodgood    . Fibromyalgia   . FIBROMYALGIA 06/21/2007   Qualifier: Diagnosis of  By: Wynona Luna   . GASTRITIS, HX OF 08/25/2008   Qualifier: Diagnosis of  By: Nelson-Smith CMA (AAMA), Dottie    . GERD 05/03/2008   Qualifier: Diagnosis of  By: Julien Girt CMA, Marliss Czar    . GERD (gastroesophageal reflux disease)   . Headache 03/15/2011  . History of solitary pulmonary nodule    right, stable by CT  . Hyperlipidemia   . HYPERLIPIDEMIA 06/21/2007     Qualifier: Diagnosis of  By: Wynona Luna   . Hypertension   . HYPERTENSION 06/21/2007   Qualifier: Diagnosis of  By: Wynona Luna   . HYPOGLYCEMIA 10/24/2010   Qualifier: Diagnosis of  By: Wynona Luna   . HYPOTHYROIDISM 07/22/2008   Qualifier: Diagnosis of  By: Wynona Luna   . IBS 06/21/2007   Qualifier: Diagnosis of  By: Wynona Luna   . IBS (irritable bowel syndrome)   . INSOMNIA, CHRONIC 07/19/2009   Qualifier: Diagnosis of  By: Wynona Luna   . LEG PAIN, RIGHT 02/01/2009   Qualifier: Diagnosis of  By: Wynona Luna   . Leukopenia    chronic  .  LEUKOPENIA, CHRONIC 06/21/2007   Qualifier: Diagnosis of  By: Wynona Luna   . NECK PAIN 02/03/2008   Qualifier: Diagnosis of  By: Wynona Luna   . NUMBNESS 01/29/2008   Qualifier: Diagnosis of  By: Loanne Drilling MD, Jacelyn Pi   . Docia Furl NOS-Unspec 06/21/2007   Centricity Description: DEGENERATIVE JOINT DISEASE, MILD Qualifier: Diagnosis of  By: Wynona Luna  Centricity Description: OSTEOARTHRITIS Qualifier: Diagnosis of  By: Wynona Luna   . PULMONARY NODULE 12/26/2007   Qualifier: Diagnosis of  By: Wynona Luna   . Routine general medical examination at a health care facility   . SCIATICA, RIGHT 05/06/2008   Qualifier: Diagnosis of  By: Nathaneil Canary, CMA, Sarah    . Stroke ([place])   . THROMBOCYTOPENIA 02/09/2009   Qualifier: Diagnosis of  By: Wynona Luna   . UNSTEADY GAIT 07/28/2009   Qualifier: Diagnosis of  By: Wynona Luna   . VENOUS INSUFFICIENCY 05/03/2008   Qualifier: Diagnosis of  By: Julien Girt CMA, Leigh      Family History  Problem Relation Age of Onset  . Cancer Mother        pancreatic  . Coronary artery disease Father   . Cancer Sister         breast  . Asthma Sister   . Breast cancer Sister   . Cancer Brother        prostate  . Allergies Brother   . Cancer Maternal Aunt        colon  . Diabetes Other   . Cancer Other        ovarian    Past Surgical History:   Procedure Laterality Date  . ABDOMINAL HYSTERECTOMY    . ANEURYSM COILING    . APPENDECTOMY    . CHOLECYSTECTOMY    . JOINT REPLACEMENT     total right knee replacement   Social History   Occupational History  . 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 Smoking status: Never Smoker  . Smokeless tobacco: Never Used  Substance and Sexual Activity  . Alcohol use: No  . Drug use: No  . Sexual activity: Not on file

## 2017-10-29 LAB — CBC WITH DIFFERENTIAL/PLATELET
BASOS ABS: 22 {cells}/uL (ref 0–200)
Basophils Relative: 0.4 %
EOS ABS: 112 {cells}/uL (ref 15–500)
EOS PCT: 2 %
HCT: 35.1 % (ref 35.0–45.0)
HEMOGLOBIN: 10.9 g/dL — AB (ref 11.7–15.5)
Lymphs Abs: 2218 cells/uL (ref 850–3900)
MCH: 25.6 pg — AB (ref 27.0–33.0)
MCHC: 31.1 g/dL — AB (ref 32.0–36.0)
MCV: 82.6 fL (ref 80.0–100.0)
MONOS PCT: 12.8 %
MPV: 12.1 fL (ref 7.5–12.5)
NEUTROS ABS: 2531 {cells}/uL (ref 1500–7800)
Neutrophils Relative %: 45.2 %
PLATELETS: 209 10*3/uL (ref 140–400)
RBC: 4.25 10*6/uL (ref 3.80–5.10)
RDW: 13 % (ref 11.0–15.0)
TOTAL LYMPHOCYTE: 39.6 %
WBC mixed population: 717 cells/uL (ref 200–950)
WBC: 5.6 10*3/uL (ref 3.8–10.8)

## 2017-10-29 LAB — C-REACTIVE PROTEIN: CRP: 2.5 mg/L (ref <8.0)

## 2017-10-29 LAB — SEDIMENTATION RATE: Sed Rate: 34 mm/h — ABNORMAL HIGH (ref 0–30)

## 2017-11-05 ENCOUNTER — Ambulatory Visit (INDEPENDENT_AMBULATORY_CARE_PROVIDER_SITE_OTHER): Payer: Medicare Other | Admitting: Orthopaedic Surgery

## 2017-11-08 ENCOUNTER — Ambulatory Visit (INDEPENDENT_AMBULATORY_CARE_PROVIDER_SITE_OTHER): Payer: Medicare Other | Admitting: Orthopaedic Surgery

## 2017-11-08 DIAGNOSIS — Z96651 Presence of right artificial knee joint: Secondary | ICD-10-CM | POA: Diagnosis not present

## 2017-11-08 NOTE — Progress Notes (Signed)
Office Visit Note   Patient: Angela Wang           Date of Birth: 1931-01-11           MRN: [id number] Visit Date: 11/08/2017              Requested by: [place], [place], [place] [address] [place] [place], [place] [address] PCP: Belva Bertin, [place], [place]   Assessment & Plan: Visit Diagnoses:  1. History of right knee joint replacement     Plan: Impression is painful right total knee replacement with negative infectious workup.  At this point I recommend physical therapy for strengthening  Follow-Up Instructions: Return if symptoms worsen or fail to improve.   Orders:  No orders of the defined types were placed in this encounter.  No orders of the defined types were placed in this encounter.     Procedures: No procedures performed   Clinical Data: No additional findings.   Subjective: No chief complaint on file.   Patient follows up today for her labs which were normal she is requesting physical therapy.  Overall she is doing a little bit better.    Review of Systems   Objective: Vital Signs: There were no vitals taken for this visit.  Physical Exam  Ortho Exam Stable exam Specialty Comments:  No specialty comments available.  Imaging: No results found.   PMFS History: Patient Active Problem List   Diagnosis Date Noted  . Cerebral embolism with cerebral infarction ([place]) 07/10/2013  . Headache(784.0) 03/15/2011  . HYPOGLYCEMIA 10/24/2010  . BACK PAIN, LUMBAR 03/06/2010  . CONSTIPATION, CHRONIC 09/20/2009  . SINUSITIS, CHRONIC 08/16/2009  . UNSTEADY GAIT 07/28/2009  . INSOMNIA, CHRONIC 07/19/2009  . THROMBOCYTOPENIA 02/09/2009  . LEG PAIN, RIGHT 02/01/2009  . Other abnormal glucose 02/01/2009  . CHEST PAIN, ATYPICAL 09/02/2008  . GASTRITIS, HX OF 08/25/2008  . HYPOTHYROIDISM 07/22/2008  . DIVERTICULOSIS-COLON 06/09/2008  . SCIATICA, RIGHT 05/06/2008  . VENOUS INSUFFICIENCY 05/03/2008  . GERD 05/03/2008  . NECK PAIN 02/03/2008    . NUMBNESS 01/29/2008  . PULMONARY NODULE 12/26/2007  . HYPERLIPIDEMIA 06/21/2007  . ANEMIA-NOS 06/21/2007  . LEUKOPENIA, CHRONIC 06/21/2007  . ANXIETY 06/21/2007  . DEPRESSION 06/21/2007  . HYPERTENSION 06/21/2007  . CEREBRAL ANEURYSM 06/21/2007  . ALLERGIC RHINITIS 06/21/2007  . IBS 06/21/2007  . Osteoarthrosis, unspecified whether generalized or localized, unspecified site 06/21/2007  . FIBROMYALGIA 06/21/2007  . CEREBROVASCULAR ACCIDENT, HX OF 06/21/2007   Past Medical History:  Diagnosis Date  . ALLERGIC RHINITIS 06/21/2007   Qualifier: Diagnosis of  By: Wynona Luna   . Allergy    allergic rhinitis  . Anemia    nos  . ANEMIA-NOS 06/21/2007   Qualifier: Diagnosis of  By: Wynona Luna   . ANXIETY 06/21/2007   Qualifier: Diagnosis of  By: Wynona Luna   . Arthritis    osteoarthritis  . Atypical chest pain 09/2008   negative myoview  . BACK PAIN, LUMBAR 03/06/2010   Qualifier: Diagnosis of  By: Wynona Luna   . CEREBRAL ANEURYSM 06/21/2007   Qualifier: History of  By: Wynona Luna   . CEREBROVASCULAR ACCIDENT, HX OF 06/21/2007   Qualifier: Diagnosis of  By: Wynona Luna   . CHEST PAIN, ATYPICAL 09/02/2008   Qualifier: Diagnosis of  By: Wynona Luna   . Chronic sinusitis   . CONSTIPATION, CHRONIC 09/20/2009   Qualifier: Diagnosis of  By:  Olevia Perches MD, Lowella Bandy   . Depression   . DEPRESSION 06/21/2007   Qualifier: Diagnosis of  By: Wynona Luna   . Diverticulosis   . DIVERTICULOSIS-COLON 06/09/2008   Qualifier: Diagnosis of  By: Chester Holstein NP, Nevin Bloodgood    . Fibromyalgia   . FIBROMYALGIA 06/21/2007   Qualifier: Diagnosis of  By: Wynona Luna   . GASTRITIS, HX OF 08/25/2008   Qualifier: Diagnosis of  By: Nelson-Smith CMA (AAMA), Dottie    . GERD 05/03/2008   Qualifier: Diagnosis of  By: Julien Girt CMA, Marliss Czar    . GERD (gastroesophageal reflux disease)   . Headache 03/15/2011  . History of solitary pulmonary nodule    right, stable by CT  .  Hyperlipidemia   . HYPERLIPIDEMIA 06/21/2007   Qualifier: Diagnosis of  By: Wynona Luna   . Hypertension   . HYPERTENSION 06/21/2007   Qualifier: Diagnosis of  By: Wynona Luna   . HYPOGLYCEMIA 10/24/2010   Qualifier: Diagnosis of  By: Wynona Luna   . HYPOTHYROIDISM 07/22/2008   Qualifier: Diagnosis of  By: Wynona Luna   . IBS 06/21/2007   Qualifier: Diagnosis of  By: Wynona Luna   . IBS (irritable bowel syndrome)   . INSOMNIA, CHRONIC 07/19/2009   Qualifier: Diagnosis of  By: Wynona Luna   . LEG PAIN, RIGHT 02/01/2009   Qualifier: Diagnosis of  By: Wynona Luna   . Leukopenia    chronic  . LEUKOPENIA, CHRONIC 06/21/2007   Qualifier: Diagnosis of  By: Wynona Luna   . NECK PAIN 02/03/2008   Qualifier: Diagnosis of  By: Wynona Luna   . NUMBNESS 01/29/2008   Qualifier: Diagnosis of  By: Loanne Drilling MD, Jacelyn Pi   . Docia Furl NOS-Unspec 06/21/2007   Centricity Description: DEGENERATIVE JOINT DISEASE, MILD Qualifier: Diagnosis of  By: Wynona Luna  Centricity Description: OSTEOARTHRITIS Qualifier: Diagnosis of  By: Wynona Luna   . PULMONARY NODULE 12/26/2007   Qualifier: Diagnosis of  By: Wynona Luna   . Routine general medical examination at a health care facility   . SCIATICA, RIGHT 05/06/2008   Qualifier: Diagnosis of  By: Nathaneil Canary, CMA, Sarah    . Stroke ([place])   . THROMBOCYTOPENIA 02/09/2009   Qualifier: Diagnosis of  By: Wynona Luna   . UNSTEADY GAIT 07/28/2009   Qualifier: Diagnosis of  By: Wynona Luna   . VENOUS INSUFFICIENCY 05/03/2008   Qualifier: Diagnosis of  By: Julien Girt CMA, Leigh      Family History  Problem Relation Age of Onset  . Cancer Mother        pancreatic  . Coronary artery disease Father   . Cancer Sister         breast  . Asthma Sister   . Breast cancer Sister   . Cancer Brother        prostate  . Allergies Brother   . Cancer Maternal Aunt        colon  . Diabetes Other   . Cancer Other         ovarian    Past Surgical History:  Procedure Laterality Date  . ABDOMINAL HYSTERECTOMY    . ANEURYSM COILING    . APPENDECTOMY    . CHOLECYSTECTOMY    . JOINT REPLACEMENT     total right knee replacement   Social History  Occupational History  . 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 Smoking status: Never Smoker  . Smokeless tobacco: Never Used  Substance and Sexual Activity  . Alcohol use: No  . Drug use: No  . Sexual activity: Not on file

## 2017-12-10 ENCOUNTER — Ambulatory Visit: Payer: Medicare Other | Admitting: Neurology

## 2017-12-11 ENCOUNTER — Other Ambulatory Visit: Payer: Self-pay

## 2017-12-11 ENCOUNTER — Encounter: Payer: Self-pay | Admitting: Physical Therapy

## 2017-12-11 ENCOUNTER — Ambulatory Visit: Payer: Medicare Other | Attending: Orthopaedic Surgery | Admitting: Physical Therapy

## 2017-12-11 DIAGNOSIS — M25561 Pain in right knee: Secondary | ICD-10-CM | POA: Diagnosis not present

## 2017-12-11 DIAGNOSIS — M25661 Stiffness of right knee, not elsewhere classified: Secondary | ICD-10-CM | POA: Diagnosis present

## 2017-12-11 DIAGNOSIS — M79604 Pain in right leg: Secondary | ICD-10-CM | POA: Diagnosis present

## 2017-12-11 DIAGNOSIS — M6281 Muscle weakness (generalized): Secondary | ICD-10-CM

## 2017-12-11 NOTE — Therapy (Signed)
[place], [place], [address] Phone: [phone]   Fax:  [phone]  Physical Therapy Evaluation  Patient Details  Name: Angela Wang MRN: [id number] Date of Birth: 1931-04-12 Referring Provider: Leandrew Koyanagi, MD   Encounter Date: 12/11/2017  PT End of Session - 12/11/17 0936    Visit Number  1    Number of Visits  13    Date for PT Re-Evaluation  01/24/18    Authorization Type  [employer]    PT Start Time  0930    PT Stop Time  1014    PT Time Calculation (min)  44 min    Activity Tolerance  Patient tolerated treatment well    Behavior During Therapy  [facility] for tasks assessed/performed       Past Medical History:  Diagnosis Date  . ALLERGIC RHINITIS 06/21/2007   Qualifier: Diagnosis of  By: Wynona Luna   . Allergy    allergic rhinitis  . Anemia    nos  . ANEMIA-NOS 06/21/2007   Qualifier: Diagnosis of  By: Wynona Luna   . ANXIETY 06/21/2007   Qualifier: Diagnosis of  By: Wynona Luna   . Arthritis    osteoarthritis  . Atypical chest pain 09/2008   negative myoview  . BACK PAIN, LUMBAR 03/06/2010   Qualifier: Diagnosis of  By: Wynona Luna   . CEREBRAL ANEURYSM 06/21/2007   Qualifier: History of  By: Wynona Luna   . CEREBROVASCULAR ACCIDENT, HX OF 06/21/2007   Qualifier: Diagnosis of  By: Wynona Luna   . CHEST PAIN, ATYPICAL 09/02/2008   Qualifier: Diagnosis of  By: Wynona Luna   . Chronic sinusitis   . CONSTIPATION, CHRONIC 09/20/2009   Qualifier: Diagnosis of  By: Olevia Perches MD, Lowella Bandy   . Depression   . DEPRESSION 06/21/2007   Qualifier: Diagnosis of  By: Wynona Luna   . Diverticulosis   . DIVERTICULOSIS-COLON 06/09/2008   Qualifier: Diagnosis of  By: Chester Holstein NP, Nevin Bloodgood    . Fibromyalgia   . FIBROMYALGIA 06/21/2007   Qualifier: Diagnosis of  By: Wynona Luna   . GASTRITIS, HX OF 08/25/2008   Qualifier: Diagnosis of  By: Nelson-Smith CMA (AAMA),  Dottie    . GERD 05/03/2008   Qualifier: Diagnosis of  By: Julien Girt CMA, Marliss Czar    . GERD (gastroesophageal reflux disease)   . Headache 03/15/2011  . History of solitary pulmonary nodule    right, stable by CT  . Hyperlipidemia   . HYPERLIPIDEMIA 06/21/2007   Qualifier: Diagnosis of  By: Wynona Luna   . Hypertension   . HYPERTENSION 06/21/2007   Qualifier: Diagnosis of  By: Wynona Luna   . HYPOGLYCEMIA 10/24/2010   Qualifier: Diagnosis of  By: Wynona Luna   . HYPOTHYROIDISM 07/22/2008   Qualifier: Diagnosis of  By: Wynona Luna   . IBS 06/21/2007   Qualifier: Diagnosis of  By: Wynona Luna   . IBS (irritable bowel syndrome)   . INSOMNIA, CHRONIC 07/19/2009   Qualifier: Diagnosis of  By: Wynona Luna   . LEG PAIN, RIGHT 02/01/2009   Qualifier: Diagnosis of  By: Wynona Luna   . Leukopenia    chronic  . LEUKOPENIA, CHRONIC 06/21/2007   Qualifier: Diagnosis of  By: Wynona Luna   . NECK  PAIN 02/03/2008   Qualifier: Diagnosis of  By: Wynona Luna   . NUMBNESS 01/29/2008   Qualifier: Diagnosis of  By: Loanne Drilling MD, Jacelyn Pi   . Docia Furl NOS-Unspec 06/21/2007   Centricity Description: DEGENERATIVE JOINT DISEASE, MILD Qualifier: Diagnosis of  By: Wynona Luna  Centricity Description: OSTEOARTHRITIS Qualifier: Diagnosis of  By: Wynona Luna   . PULMONARY NODULE 12/26/2007   Qualifier: Diagnosis of  By: Wynona Luna   . Routine general medical examination at a health care facility   . SCIATICA, RIGHT 05/06/2008   Qualifier: Diagnosis of  By: Nathaneil Canary, CMA, Sarah    . Stroke ([place])   . THROMBOCYTOPENIA 02/09/2009   Qualifier: Diagnosis of  By: Wynona Luna   . UNSTEADY GAIT 07/28/2009   Qualifier: Diagnosis of  By: Wynona Luna   . VENOUS INSUFFICIENCY 05/03/2008   Qualifier: Diagnosis of  By: Julien Girt CMA, Marliss Czar      Past Surgical History:  Procedure Laterality Date  . ABDOMINAL HYSTERECTOMY    . ANEURYSM COILING    . APPENDECTOMY     . CHOLECYSTECTOMY    . JOINT REPLACEMENT     total right knee replacement    There were no vitals filed for this visit.   Subjective Assessment - 12/11/17 0943    Subjective  My foot goes in now where it used to go straight. I used to be able to raise it way up. It hurts from (point to) anterior hip to distal, anterior knee. It has some heat in it. I can hardly drive and when I wear stockings I cannot drive because my foot goes in. Had infection in heel after last TKA and my heel has not been the same. Used to walk for exercise 2-3/week, went to a center for 2 hr. I sleep okay. Now I have to use my arms to get out of a chair. Had 6 months of PT after revision. I had an accident in 2016 and started back to PT. I got up on the doctors table just before thanksgiving with the wrong knee and it has hurt since then. Denies cavitations with pain. CVA affected Rt side of body in 2000.     How long can you stand comfortably?  no problem, sometimes I think i need more balance but otherwise no problem    Currently in Pain?  Yes    Pain Score  8     Pain Location  Knee    Pain Orientation  Right    Pain Descriptors / Indicators  -- stiff    Pain Radiating Towards  anterior thigh, posterior calf to heel    Aggravating Factors   lifting leg    Pain Relieving Factors  exercises         [facility] PT Assessment - 12/11/17 0001      Assessment   Medical Diagnosis  right knee pain    Referring Provider  Leandrew Koyanagi, MD    Onset Date/Surgical Date  -- revision 2015, pain Thanksgiving 2018    Hand Dominance  Right    Next MD Visit  PRN    Prior Therapy  yes      Precautions   Precautions  None      Restrictions   Weight Bearing Restrictions  No      Balance Screen   Has the patient fallen in the past 6 months  No  Home Environment   Living Environment  Private residence    Living Arrangements  Alone    Additional Comments  no stairs      Prior Function   Level of Independence   Independent      Cognition   Overall Cognitive Status  Within Functional Limits for tasks assessed      Observation/Other Assessments   Focus on Therapeutic Outcomes (FOTO)   68% limited      Sensation   Additional Comments  tingling in Left toes, sometimes in Rt foot      ROM / Strength   AROM / PROM / Strength  AROM;Strength      AROM   AROM Assessment Site  Knee    Right/Left Knee  Right    Right Knee Extension  0    Right Knee Flexion  95      Strength   Strength Assessment Site  Knee;Hip    Right/Left Hip  Right    Right Hip Flexion  3/5 thigh pain    Right Hip Extension  3+/5 meas in supine    Right Hip ABduction  3/5    Right/Left Knee  Right    Right Knee Flexion  4/5    Right Knee Extension  4/5      Palpation   Palpation comment  TTP superior aspect of Rt knee and patellar tendon             Objective measurements completed on examination: See above findings.              PT Education - 12/11/17 1024    Education provided  Yes    Education Details  anatomy of condition, POC, HEP, exercise form/rationale, FOTO    Person(s) Educated  Patient    Methods  Explanation;Demonstration;Tactile cues;Verbal cues;Handout    Comprehension  Verbalized understanding;Need further instruction;Returned demonstration;Verbal cues required;Tactile cues required       PT Short Term Goals - 12/11/17 1028      PT SHORT TERM GOAL #1   Title  Pt will be independent with HEP as it has been established and able to demonstrate proper form    Baseline  will progress as appropriate    Time  3    Period  Weeks    Status  New    Target Date  01/03/18        PT Long Term Goals - 12/11/17 1025      PT LONG TERM GOAL #1   Title  Pt will be able to stand from a standard height chair without use of upper extremities    Baseline  unable at eval but reports she could before Thanksgiving    Time  6    Period  Weeks    Status  New    Target Date  01/24/18      PT  LONG TERM GOAL #2   Title  Gross LE strength 4+/5 for support to biomechanical chain    Baseline  see flowsheet    Time  6    Period  Weeks    Status  New    Target Date  01/24/18      PT LONG TERM GOAL #3   Title  Pt will report ability to drive without limitation by Rt leg    Baseline  significant difficulty noted at eval    Time  6    Period  Weeks    Status  New  Target Date  01/24/18      PT LONG TERM GOAL #4   Title  FOTO to 52% limitation    Baseline  68% limitation at eval    Time  6    Period  Weeks    Status  New    Target Date  01/24/18             Plan - 12/11/17 1019    Clinical Impression Statement  Pt presents to PT with complaints of Rt knee pain that increased just before thanksgiving last year. History of revision. Pt with notable weakness along LE biomechanical chain as well as limited flexibility. Ambulates with cane, slow cadence and flat foot strike. H/o CVA affecting Rt side. Pt will benefit from skilled PT in order to improve gross strength and function to reach long term goals.     History and Personal Factors relevant to plan of care:  h/o CVA, knee revision, anxiety, depression, fibromyalgia, OA    Clinical Presentation  Evolving    Clinical Presentation due to:  pain since revision that was improving with recent increase in pain    Clinical Decision Making  Moderate    Rehab Potential  Good    PT Frequency  2x / week    PT Duration  6 weeks    PT Treatment/Interventions  ADLs/Self Care Home Management;Cryotherapy;Electrical Stimulation;Ultrasound;Moist Heat;Gait training;Stair training;Functional mobility training;Therapeutic exercise;Balance training;Therapeutic activities;Patient/family education;Neuromuscular re-education;Manual techniques;Scar mobilization;Passive range of motion;Taping;Dry needling    PT Next Visit Plan  nu step, quad stretching, gastroc stretching, heel-toe gait training    PT Home Exercise Plan  hooklying clam & ball  squeeze, LAQ, roller to quads    Consulted and Agree with Plan of Care  Patient       Patient will benefit from skilled therapeutic intervention in order to improve the following deficits and impairments:  Abnormal gait, Improper body mechanics, Pain, Increased muscle spasms, Decreased scar mobility, Decreased activity tolerance, Decreased range of motion, Decreased strength, Impaired flexibility, Difficulty walking  Visit Diagnosis: Acute pain of right knee - Plan: PT plan of care cert/re-cert  Pain in right leg - Plan: PT plan of care cert/re-cert  Joint stiffness of right lower leg - Plan: PT plan of care cert/re-cert  Muscle weakness (generalized) - Plan: PT plan of care cert/re-cert     Problem List Patient Active Problem List   Diagnosis Date Noted  . Cerebral embolism with cerebral infarction ([place]) 07/10/2013  . Headache(784.0) 03/15/2011  . HYPOGLYCEMIA 10/24/2010  . BACK PAIN, LUMBAR 03/06/2010  . CONSTIPATION, CHRONIC 09/20/2009  . SINUSITIS, CHRONIC 08/16/2009  . UNSTEADY GAIT 07/28/2009  . INSOMNIA, CHRONIC 07/19/2009  . THROMBOCYTOPENIA 02/09/2009  . LEG PAIN, RIGHT 02/01/2009  . Other abnormal glucose 02/01/2009  . CHEST PAIN, ATYPICAL 09/02/2008  . GASTRITIS, HX OF 08/25/2008  . HYPOTHYROIDISM 07/22/2008  . DIVERTICULOSIS-COLON 06/09/2008  . SCIATICA, RIGHT 05/06/2008  . VENOUS INSUFFICIENCY 05/03/2008  . GERD 05/03/2008  . NECK PAIN 02/03/2008  . NUMBNESS 01/29/2008  . PULMONARY NODULE 12/26/2007  . HYPERLIPIDEMIA 06/21/2007  . ANEMIA-NOS 06/21/2007  . LEUKOPENIA, CHRONIC 06/21/2007  . ANXIETY 06/21/2007  . DEPRESSION 06/21/2007  . HYPERTENSION 06/21/2007  . CEREBRAL ANEURYSM 06/21/2007  . ALLERGIC RHINITIS 06/21/2007  . IBS 06/21/2007  . Osteoarthrosis, unspecified whether generalized or localized, unspecified site 06/21/2007  . FIBROMYALGIA 06/21/2007  . CEREBROVASCULAR ACCIDENT, HX OF 06/21/2007    Jose Corvin C. Zariel Capano PT, DPT 12/11/17  10:32 AM   [facility]  [place], [place], [address] Phone: [phone]   Fax:  [phone]  Name: Angela Wang MRN: [id number] Date of Birth: [birth year]

## 2017-12-16 ENCOUNTER — Ambulatory Visit: Payer: Medicare Other | Admitting: Physical Therapy

## 2017-12-16 ENCOUNTER — Encounter: Payer: Self-pay | Admitting: Physical Therapy

## 2017-12-16 DIAGNOSIS — M25561 Pain in right knee: Secondary | ICD-10-CM | POA: Diagnosis not present

## 2017-12-16 DIAGNOSIS — M79604 Pain in right leg: Secondary | ICD-10-CM

## 2017-12-16 DIAGNOSIS — M6281 Muscle weakness (generalized): Secondary | ICD-10-CM

## 2017-12-16 DIAGNOSIS — M25661 Stiffness of right knee, not elsewhere classified: Secondary | ICD-10-CM

## 2017-12-16 NOTE — Therapy (Signed)
[place], [place], [address] Phone: [phone]   Fax:  [phone]  Physical Therapy Treatment  Patient Details  Name: Angela Wang MRN: [id number] Date of Birth: 1931/11/12 Referring Provider: Leandrew Koyanagi, MD   Encounter Date: 12/16/2017  PT End of Session - 12/16/17 1103    Visit Number  2    Number of Visits  13    Date for PT Re-Evaluation  01/24/18    Authorization Type  [employer]    PT Start Time  1103    PT Stop Time  1154    PT Time Calculation (min)  51 min    Activity Tolerance  Patient tolerated treatment well    Behavior During Therapy  Angela Wang for tasks assessed/performed       Past Medical History:  Diagnosis Date  . ALLERGIC RHINITIS 06/21/2007   Qualifier: Diagnosis of  By: Wynona Luna   . Allergy    allergic rhinitis  . Anemia    nos  . ANEMIA-NOS 06/21/2007   Qualifier: Diagnosis of  By: Wynona Luna   . ANXIETY 06/21/2007   Qualifier: Diagnosis of  By: Wynona Luna   . Arthritis    osteoarthritis  . Atypical chest pain 09/2008   negative myoview  . BACK PAIN, LUMBAR 03/06/2010   Qualifier: Diagnosis of  By: Wynona Luna   . CEREBRAL ANEURYSM 06/21/2007   Qualifier: History of  By: Wynona Luna   . CEREBROVASCULAR ACCIDENT, HX OF 06/21/2007   Qualifier: Diagnosis of  By: Wynona Luna   . CHEST PAIN, ATYPICAL 09/02/2008   Qualifier: Diagnosis of  By: Wynona Luna   . Chronic sinusitis   . CONSTIPATION, CHRONIC 09/20/2009   Qualifier: Diagnosis of  By: Olevia Perches MD, Lowella Bandy   . Depression   . DEPRESSION 06/21/2007   Qualifier: Diagnosis of  By: Wynona Luna   . Diverticulosis   . DIVERTICULOSIS-COLON 06/09/2008   Qualifier: Diagnosis of  By: Chester Holstein NP, Nevin Bloodgood    . Fibromyalgia   . FIBROMYALGIA 06/21/2007   Qualifier: Diagnosis of  By: Wynona Luna   . GASTRITIS, HX OF 08/25/2008   Qualifier: Diagnosis of  By: Nelson-Smith CMA (AAMA),  Dottie    . GERD 05/03/2008   Qualifier: Diagnosis of  By: Julien Girt CMA, Marliss Czar    . GERD (gastroesophageal reflux disease)   . Headache 03/15/2011  . History of solitary pulmonary nodule    right, stable by CT  . Hyperlipidemia   . HYPERLIPIDEMIA 06/21/2007   Qualifier: Diagnosis of  By: Wynona Luna   . Hypertension   . HYPERTENSION 06/21/2007   Qualifier: Diagnosis of  By: Wynona Luna   . HYPOGLYCEMIA 10/24/2010   Qualifier: Diagnosis of  By: Wynona Luna   . HYPOTHYROIDISM 07/22/2008   Qualifier: Diagnosis of  By: Wynona Luna   . IBS 06/21/2007   Qualifier: Diagnosis of  By: Wynona Luna   . IBS (irritable bowel syndrome)   . INSOMNIA, CHRONIC 07/19/2009   Qualifier: Diagnosis of  By: Wynona Luna   . LEG PAIN, RIGHT 02/01/2009   Qualifier: Diagnosis of  By: Wynona Luna   . Leukopenia    chronic  . LEUKOPENIA, CHRONIC 06/21/2007   Qualifier: Diagnosis of  By: Wynona Luna   . NECK  PAIN 02/03/2008   Qualifier: Diagnosis of  By: Wynona Luna   . NUMBNESS 01/29/2008   Qualifier: Diagnosis of  By: Loanne Drilling MD, Jacelyn Pi   . Docia Furl NOS-Unspec 06/21/2007   Centricity Description: DEGENERATIVE JOINT DISEASE, MILD Qualifier: Diagnosis of  By: Wynona Luna  Centricity Description: OSTEOARTHRITIS Qualifier: Diagnosis of  By: Wynona Luna   . PULMONARY NODULE 12/26/2007   Qualifier: Diagnosis of  By: Wynona Luna   . Routine general medical examination at a health care facility   . SCIATICA, RIGHT 05/06/2008   Qualifier: Diagnosis of  By: Nathaneil Canary, CMA, Sarah    . Stroke ([place])   . THROMBOCYTOPENIA 02/09/2009   Qualifier: Diagnosis of  By: Wynona Luna   . UNSTEADY GAIT 07/28/2009   Qualifier: Diagnosis of  By: Wynona Luna   . VENOUS INSUFFICIENCY 05/03/2008   Qualifier: Diagnosis of  By: Julien Girt CMA, Marliss Czar      Past Surgical History:  Procedure Laterality Date  . ABDOMINAL HYSTERECTOMY    . ANEURYSM COILING    . APPENDECTOMY     . CHOLECYSTECTOMY    . JOINT REPLACEMENT     total right knee replacement    There were no vitals filed for this visit.  Subjective Assessment - 12/16/17 1103    Subjective  I feel some pain in my knee bu I did a lot of stretching with the rubber band which helped.     Currently in Pain?  Yes    Pain Score  6     Pain Location  Knee    Pain Orientation  Right    Pain Descriptors / Indicators  Aching                      [facility] Adult PT Treatment/Exercise - 12/16/17 0001      Exercises   Exercises  Knee/Hip      Knee/Hip Exercises: Stretches   Passive Hamstring Stretch  Both;2 reps;30 seconds seated EOB    Hip Flexor Stretch Limitations  thomas test stretch    Knee: Self-Stretch Limitations  heel slides with strap    Gastroc Stretch  Both;30 seconds slant board    Other Knee/Hip Stretches  lower trunk rotation      Knee/Hip Exercises: Aerobic   Nustep  5 min L5 LE only      Knee/Hip Exercises: Seated   Sit to Sand  10 reps;with UE support;without UE support      Knee/Hip Exercises: Supine   Quad Sets  10 reps    Bridges with [employer]  15 reps    Straight Leg Raises  15 reps cues for quad set prior to lift    Other Supine Knee/Hip Exercises  hooklying clam green tband      Modalities   Modalities  Moist Heat      Moist Heat Therapy   Number Minutes Moist Heat  10 Minutes    Moist Heat Location  Knee Rt      Manual Therapy   Manual Therapy  Joint mobilization;Passive ROM    Joint Mobilization  Rt patellar mobs    Passive ROM  RT hip & knee             PT Education - 12/16/17 1300    Education provided  Yes    Education Details  exercise form/rationale    Person(s) Educated  Patient    Methods  Explanation;Demonstration;Tactile cues;Verbal cues;Handout    Comprehension  Verbalized understanding;Need further instruction;Returned demonstration;Verbal cues required;Tactile cues required       PT Short Term Goals - 12/11/17 1028       PT SHORT TERM GOAL #1   Title  Pt will be independent with HEP as it has been established and able to demonstrate proper form    Baseline  will progress as appropriate    Time  3    Period  Weeks    Status  New    Target Date  01/03/18        PT Long Term Goals - 12/11/17 1025      PT LONG TERM GOAL #1   Title  Pt will be able to stand from a standard height chair without use of upper extremities    Baseline  unable at eval but reports she could before Thanksgiving    Time  6    Period  Weeks    Status  New    Target Date  01/24/18      PT LONG TERM GOAL #2   Title  Gross LE strength 4+/5 for support to biomechanical chain    Baseline  see flowsheet    Time  6    Period  Weeks    Status  New    Target Date  01/24/18      PT LONG TERM GOAL #3   Title  Pt will report ability to drive without limitation by Rt leg    Baseline  significant difficulty noted at eval    Time  6    Period  Weeks    Status  New    Target Date  01/24/18      PT LONG TERM GOAL #4   Title  FOTO to 52% limitation    Baseline  68% limitation at eval    Time  6    Period  Weeks    Status  New    Target Date  01/24/18            Plan - 12/16/17 1300    Clinical Impression Statement  Good tolerance to exercise today. difficulty relaxing for passive ROM. was able to stand from raised table without using UE after about 5 reps with hands and cues for use of glutes. Pt demo smoother gait with heel toe pattern without cane than with but reports she needs it for safety.     PT Treatment/Interventions  ADLs/Self Care Home Management;Cryotherapy;Electrical Stimulation;Ultrasound;Moist Heat;Gait training;Stair training;Functional mobility training;Therapeutic exercise;Balance training;Therapeutic activities;Patient/family education;Neuromuscular re-education;Manual techniques;Scar mobilization;Passive range of motion;Taping;Dry needling    PT Next Visit Plan  practice gait with cane-upright, heel toe;  gross LE strengthening, review stretches    PT Home Exercise Plan  hooklying clam & ball squeeze, LAQ, roller to quads, Thomas stretch, sit<>stand, seated HSS;     Consulted and Agree with Plan of Care  Patient       Patient will benefit from skilled therapeutic intervention in order to improve the following deficits and impairments:  Abnormal gait, Improper body mechanics, Pain, Increased muscle spasms, Decreased scar mobility, Decreased activity tolerance, Decreased range of motion, Decreased strength, Impaired flexibility, Difficulty walking  Visit Diagnosis: Acute pain of right knee  Pain in right leg  Joint stiffness of right lower leg  Muscle weakness (generalized)     Problem List Patient Active Problem List   Diagnosis Date Noted  . Cerebral embolism with cerebral infarction ([place]) 07/10/2013  .  Headache(784.0) 03/15/2011  . HYPOGLYCEMIA 10/24/2010  . BACK PAIN, LUMBAR 03/06/2010  . CONSTIPATION, CHRONIC 09/20/2009  . SINUSITIS, CHRONIC 08/16/2009  . UNSTEADY GAIT 07/28/2009  . INSOMNIA, CHRONIC 07/19/2009  . THROMBOCYTOPENIA 02/09/2009  . LEG PAIN, RIGHT 02/01/2009  . Other abnormal glucose 02/01/2009  . CHEST PAIN, ATYPICAL 09/02/2008  . GASTRITIS, HX OF 08/25/2008  . HYPOTHYROIDISM 07/22/2008  . DIVERTICULOSIS-COLON 06/09/2008  . SCIATICA, RIGHT 05/06/2008  . VENOUS INSUFFICIENCY 05/03/2008  . GERD 05/03/2008  . NECK PAIN 02/03/2008  . NUMBNESS 01/29/2008  . PULMONARY NODULE 12/26/2007  . HYPERLIPIDEMIA 06/21/2007  . ANEMIA-NOS 06/21/2007  . LEUKOPENIA, CHRONIC 06/21/2007  . ANXIETY 06/21/2007  . DEPRESSION 06/21/2007  . HYPERTENSION 06/21/2007  . CEREBRAL ANEURYSM 06/21/2007  . ALLERGIC RHINITIS 06/21/2007  . IBS 06/21/2007  . Osteoarthrosis, unspecified whether generalized or localized, unspecified site 06/21/2007  . FIBROMYALGIA 06/21/2007  . CEREBROVASCULAR ACCIDENT, HX OF 06/21/2007   Angela Wang PT, DPT 12/16/17 1:04 PM   Angela Wang [address] [place], [place], [address] Phone: [phone]   Fax:  [phone]  Name: Angela Wang MRN: [id number] Date of Birth: 10-15-1931

## 2017-12-18 ENCOUNTER — Encounter: Payer: Self-pay | Admitting: Physical Therapy

## 2017-12-18 ENCOUNTER — Ambulatory Visit: Payer: Medicare Other | Admitting: Physical Therapy

## 2017-12-18 DIAGNOSIS — M25561 Pain in right knee: Secondary | ICD-10-CM

## 2017-12-18 DIAGNOSIS — M25661 Stiffness of right knee, not elsewhere classified: Secondary | ICD-10-CM

## 2017-12-18 DIAGNOSIS — M79604 Pain in right leg: Secondary | ICD-10-CM

## 2017-12-18 DIAGNOSIS — M6281 Muscle weakness (generalized): Secondary | ICD-10-CM

## 2017-12-18 NOTE — Therapy (Signed)
[place], [place], [address] Phone: [phone]   Fax:  [phone]  Physical Therapy Treatment  Patient Details  Name: Angela Wang MRN: [id number] Date of Birth: [birth year] Referring Provider: Leandrew Koyanagi, MD   Encounter Date: 12/18/2017  PT End of Session - 12/18/17 1628    Visit Number  3    Number of Visits  13    Date for PT Re-Evaluation  01/24/18    Authorization Type  [employer]    PT Start Time  1628    PT Stop Time  1722    PT Time Calculation (min)  54 min    Activity Tolerance  Patient tolerated treatment well    Behavior During Therapy  [facility] for tasks assessed/performed       Past Medical History:  Diagnosis Date  . ALLERGIC RHINITIS 06/21/2007   Qualifier: Diagnosis of  By: Wynona Luna   . Allergy    allergic rhinitis  . Anemia    nos  . ANEMIA-NOS 06/21/2007   Qualifier: Diagnosis of  By: Wynona Luna   . ANXIETY 06/21/2007   Qualifier: Diagnosis of  By: Wynona Luna   . Arthritis    osteoarthritis  . Atypical chest pain 09/2008   negative myoview  . BACK PAIN, LUMBAR 03/06/2010   Qualifier: Diagnosis of  By: Wynona Luna   . CEREBRAL ANEURYSM 06/21/2007   Qualifier: History of  By: Wynona Luna   . CEREBROVASCULAR ACCIDENT, HX OF 06/21/2007   Qualifier: Diagnosis of  By: Wynona Luna   . CHEST PAIN, ATYPICAL 09/02/2008   Qualifier: Diagnosis of  By: Wynona Luna   . Chronic sinusitis   . CONSTIPATION, CHRONIC 09/20/2009   Qualifier: Diagnosis of  By: Olevia Perches MD, Lowella Bandy   . Depression   . DEPRESSION 06/21/2007   Qualifier: Diagnosis of  By: Wynona Luna   . Diverticulosis   . DIVERTICULOSIS-COLON 06/09/2008   Qualifier: Diagnosis of  By: Chester Holstein NP, Nevin Bloodgood    . Fibromyalgia   . FIBROMYALGIA 06/21/2007   Qualifier: Diagnosis of  By: Wynona Luna   . GASTRITIS, HX OF 08/25/2008   Qualifier: Diagnosis of  By: Nelson-Smith CMA (AAMA),  Dottie    . GERD 05/03/2008   Qualifier: Diagnosis of  By: Julien Girt CMA, Marliss Czar    . GERD (gastroesophageal reflux disease)   . Headache 03/15/2011  . History of solitary pulmonary nodule    right, stable by CT  . Hyperlipidemia   . HYPERLIPIDEMIA 06/21/2007   Qualifier: Diagnosis of  By: Wynona Luna   . Hypertension   . HYPERTENSION 06/21/2007   Qualifier: Diagnosis of  By: Wynona Luna   . HYPOGLYCEMIA 10/24/2010   Qualifier: Diagnosis of  By: Wynona Luna   . HYPOTHYROIDISM 07/22/2008   Qualifier: Diagnosis of  By: Wynona Luna   . IBS 06/21/2007   Qualifier: Diagnosis of  By: Wynona Luna   . IBS (irritable bowel syndrome)   . INSOMNIA, CHRONIC 07/19/2009   Qualifier: Diagnosis of  By: Wynona Luna   . LEG PAIN, RIGHT 02/01/2009   Qualifier: Diagnosis of  By: Wynona Luna   . Leukopenia    chronic  . LEUKOPENIA, CHRONIC 06/21/2007   Qualifier: Diagnosis of  By: Wynona Luna   . NECK  PAIN 02/03/2008   Qualifier: Diagnosis of  By: Wynona Luna   . NUMBNESS 01/29/2008   Qualifier: Diagnosis of  By: Loanne Drilling MD, Jacelyn Pi   . Docia Furl NOS-Unspec 06/21/2007   Centricity Description: DEGENERATIVE JOINT DISEASE, MILD Qualifier: Diagnosis of  By: Wynona Luna  Centricity Description: OSTEOARTHRITIS Qualifier: Diagnosis of  By: Wynona Luna   . PULMONARY NODULE 12/26/2007   Qualifier: Diagnosis of  By: Wynona Luna   . Routine general medical examination at a health care facility   . SCIATICA, RIGHT 05/06/2008   Qualifier: Diagnosis of  By: Nathaneil Canary, CMA, Sarah    . Stroke ([place])   . THROMBOCYTOPENIA 02/09/2009   Qualifier: Diagnosis of  By: Wynona Luna   . UNSTEADY GAIT 07/28/2009   Qualifier: Diagnosis of  By: Wynona Luna   . VENOUS INSUFFICIENCY 05/03/2008   Qualifier: Diagnosis of  By: Julien Girt CMA, Marliss Czar      Past Surgical History:  Procedure Laterality Date  . ABDOMINAL HYSTERECTOMY    . ANEURYSM COILING    . APPENDECTOMY     . CHOLECYSTECTOMY    . JOINT REPLACEMENT     total right knee replacement    There were no vitals filed for this visit.  Subjective Assessment - 12/18/17 1640    Subjective  Feeling a little sore and stiff today. I tried to lay on my side a little bit, it didn't work. I think I need some good shoes.     Currently in Pain?  Yes    Pain Score  8     Pain Location  Knee    Pain Orientation  Right    Pain Descriptors / Indicators  Sore stiff    Aggravating Factors   laying on her side    Pain Relieving Factors  stretches, exercises                      [facility] Adult PT Treatment/Exercise - 12/18/17 0001      Knee/Hip Exercises: Stretches   Knee: Self-Stretch Limitations  heel slides with strap    Gastroc Stretch  Both;30 seconds      Knee/Hip Exercises: Aerobic   Nustep  10 min L3 LE only      Knee/Hip Exercises: Standing   Heel Raises  20 reps    Extension Limitations  x15 each flexed at counter      Knee/Hip Exercises: Seated   Long Arc Quad  Both;10 reps alternating, ball bw knees      Knee/Hip Exercises: Supine   Bridges with Clamshell  15 reps green tband    Straight Leg Raises  Both;10 reps    Straight Leg Raise with External Rotation  Both;10 reps      Moist Heat Therapy   Number Minutes Moist Heat  10 Minutes    Moist Heat Location  Knee      Manual Therapy   Manual Therapy  Soft tissue mobilization    Soft tissue mobilization  roller Rt thigh               PT Short Term Goals - 12/11/17 1028      PT SHORT TERM GOAL #1   Title  Pt will be independent with HEP as it has been established and able to demonstrate proper form    Baseline  will progress as appropriate    Time  3    Period  Weeks    Status  New    Target Date  01/03/18        PT Long Term Goals - 12/11/17 1025      PT LONG TERM GOAL #1   Title  Pt will be able to stand from a standard height chair without use of upper extremities    Baseline  unable at eval but  reports she could before Thanksgiving    Time  6    Period  Weeks    Status  New    Target Date  01/24/18      PT LONG TERM GOAL #2   Title  Gross LE strength 4+/5 for support to biomechanical chain    Baseline  see flowsheet    Time  6    Period  Weeks    Status  New    Target Date  01/24/18      PT LONG TERM GOAL #3   Title  Pt will report ability to drive without limitation by Rt leg    Baseline  significant difficulty noted at eval    Time  6    Period  Weeks    Status  New    Target Date  01/24/18      PT LONG TERM GOAL #4   Title  FOTO to 52% limitation    Baseline  68% limitation at eval    Time  6    Period  Weeks    Status  New    Target Date  01/24/18            Plan - 12/18/17 1713    Clinical Impression Statement  Pt tolerated exercises well today. Reported reduced soreness/stiffness after treatment. Is going to get some shoes that feel more supportive for her feet.     PT Treatment/Interventions  ADLs/Self Care Home Management;Cryotherapy;Electrical Stimulation;Ultrasound;Moist Heat;Gait training;Stair training;Functional mobility training;Therapeutic exercise;Balance training;Therapeutic activities;Patient/family education;Neuromuscular re-education;Manual techniques;Scar mobilization;Passive range of motion;Taping;Dry needling    PT Next Visit Plan  balance/gait challenges    PT Home Exercise Plan  hooklying clam & ball squeeze, LAQ, roller to quads, Thomas stretch, sit<>stand, seated HSS; SLR, bridge, heel raises    Consulted and Agree with Plan of Care  Patient       Patient will benefit from skilled therapeutic intervention in order to improve the following deficits and impairments:  Abnormal gait, Improper body mechanics, Pain, Increased muscle spasms, Decreased scar mobility, Decreased activity tolerance, Decreased range of motion, Decreased strength, Impaired flexibility, Difficulty walking  Visit Diagnosis: Acute pain of right knee  Pain in  right leg  Joint stiffness of right lower leg  Muscle weakness (generalized)     Problem List Patient Active Problem List   Diagnosis Date Noted  . Cerebral embolism with cerebral infarction ([place]) 07/10/2013  . Headache(784.0) 03/15/2011  . HYPOGLYCEMIA 10/24/2010  . BACK PAIN, LUMBAR 03/06/2010  . CONSTIPATION, CHRONIC 09/20/2009  . SINUSITIS, CHRONIC 08/16/2009  . UNSTEADY GAIT 07/28/2009  . INSOMNIA, CHRONIC 07/19/2009  . THROMBOCYTOPENIA 02/09/2009  . LEG PAIN, RIGHT 02/01/2009  . Other abnormal glucose 02/01/2009  . CHEST PAIN, ATYPICAL 09/02/2008  . GASTRITIS, HX OF 08/25/2008  . HYPOTHYROIDISM 07/22/2008  . DIVERTICULOSIS-COLON 06/09/2008  . SCIATICA, RIGHT 05/06/2008  . VENOUS INSUFFICIENCY 05/03/2008  . GERD 05/03/2008  . NECK PAIN 02/03/2008  . NUMBNESS 01/29/2008  . PULMONARY NODULE 12/26/2007  . HYPERLIPIDEMIA 06/21/2007  . ANEMIA-NOS 06/21/2007  . LEUKOPENIA, CHRONIC 06/21/2007  . ANXIETY 06/21/2007  .  DEPRESSION 06/21/2007  . HYPERTENSION 06/21/2007  . CEREBRAL ANEURYSM 06/21/2007  . ALLERGIC RHINITIS 06/21/2007  . IBS 06/21/2007  . Osteoarthrosis, unspecified whether generalized or localized, unspecified site 06/21/2007  . FIBROMYALGIA 06/21/2007  . CEREBROVASCULAR ACCIDENT, HX OF 06/21/2007    Arlene Genova C. Chantea Surace PT, DPT 12/18/17 5:15 PM   [facility] [address] [place], [place], [address] Phone: [phone]   Fax:  [phone]  Name: CELISE BAZAR MRN: [id number] Date of Birth: 1931/07/31

## 2017-12-24 ENCOUNTER — Ambulatory Visit: Payer: Medicare Other | Admitting: Physical Therapy

## 2017-12-24 ENCOUNTER — Encounter: Payer: Self-pay | Admitting: Physical Therapy

## 2017-12-24 DIAGNOSIS — M79604 Pain in right leg: Secondary | ICD-10-CM

## 2017-12-24 DIAGNOSIS — M25561 Pain in right knee: Secondary | ICD-10-CM | POA: Diagnosis not present

## 2017-12-24 DIAGNOSIS — M6281 Muscle weakness (generalized): Secondary | ICD-10-CM

## 2017-12-24 DIAGNOSIS — M25661 Stiffness of right knee, not elsewhere classified: Secondary | ICD-10-CM

## 2017-12-24 NOTE — Therapy (Signed)
[place], [place], [address] Phone: [phone]   Fax:  [phone]  Physical Therapy Treatment  Patient Details  Name: Angela Wang MRN: [id number] Date of Birth: 1931/02/27 Referring Provider: Leandrew Koyanagi, MD   Encounter Date: 12/24/2017  PT End of Session - 12/24/17 1109    Visit Number  4    Number of Visits  13    Date for PT Re-Evaluation  01/24/18    Authorization Type  [employer]    PT Start Time  1109 pt arrived late    PT Stop Time  1157    PT Time Calculation (min)  48 min    Activity Tolerance  Patient tolerated treatment well    Behavior During Therapy  [facility] for tasks assessed/performed       Past Medical History:  Diagnosis Date  . ALLERGIC RHINITIS 06/21/2007   Qualifier: Diagnosis of  By: Wynona Luna   . Allergy    allergic rhinitis  . Anemia    nos  . ANEMIA-NOS 06/21/2007   Qualifier: Diagnosis of  By: Wynona Luna   . ANXIETY 06/21/2007   Qualifier: Diagnosis of  By: Wynona Luna   . Arthritis    osteoarthritis  . Atypical chest pain 09/2008   negative myoview  . BACK PAIN, LUMBAR 03/06/2010   Qualifier: Diagnosis of  By: Wynona Luna   . CEREBRAL ANEURYSM 06/21/2007   Qualifier: History of  By: Wynona Luna   . CEREBROVASCULAR ACCIDENT, HX OF 06/21/2007   Qualifier: Diagnosis of  By: Wynona Luna   . CHEST PAIN, ATYPICAL 09/02/2008   Qualifier: Diagnosis of  By: Wynona Luna   . Chronic sinusitis   . CONSTIPATION, CHRONIC 09/20/2009   Qualifier: Diagnosis of  By: Olevia Perches MD, Lowella Bandy   . Depression   . DEPRESSION 06/21/2007   Qualifier: Diagnosis of  By: Wynona Luna   . Diverticulosis   . DIVERTICULOSIS-COLON 06/09/2008   Qualifier: Diagnosis of  By: Chester Holstein NP, Nevin Bloodgood    . Fibromyalgia   . FIBROMYALGIA 06/21/2007   Qualifier: Diagnosis of  By: Wynona Luna   . GASTRITIS, HX OF 08/25/2008   Qualifier: Diagnosis of  By: Nelson-Smith  CMA (AAMA), Dottie    . GERD 05/03/2008   Qualifier: Diagnosis of  By: Julien Girt CMA, Marliss Czar    . GERD (gastroesophageal reflux disease)   . Headache 03/15/2011  . History of solitary pulmonary nodule    right, stable by CT  . Hyperlipidemia   . HYPERLIPIDEMIA 06/21/2007   Qualifier: Diagnosis of  By: Wynona Luna   . Hypertension   . HYPERTENSION 06/21/2007   Qualifier: Diagnosis of  By: Wynona Luna   . HYPOGLYCEMIA 10/24/2010   Qualifier: Diagnosis of  By: Wynona Luna   . HYPOTHYROIDISM 07/22/2008   Qualifier: Diagnosis of  By: Wynona Luna   . IBS 06/21/2007   Qualifier: Diagnosis of  By: Wynona Luna   . IBS (irritable bowel syndrome)   . INSOMNIA, CHRONIC 07/19/2009   Qualifier: Diagnosis of  By: Wynona Luna   . LEG PAIN, RIGHT 02/01/2009   Qualifier: Diagnosis of  By: Wynona Luna   . Leukopenia    chronic  . LEUKOPENIA, CHRONIC 06/21/2007   Qualifier: Diagnosis of  By: Wynona Luna   .  NECK PAIN 02/03/2008   Qualifier: Diagnosis of  By: Wynona Luna   . NUMBNESS 01/29/2008   Qualifier: Diagnosis of  By: Loanne Drilling MD, Jacelyn Pi   . Docia Furl NOS-Unspec 06/21/2007   Centricity Description: DEGENERATIVE JOINT DISEASE, MILD Qualifier: Diagnosis of  By: Wynona Luna  Centricity Description: OSTEOARTHRITIS Qualifier: Diagnosis of  By: Wynona Luna   . PULMONARY NODULE 12/26/2007   Qualifier: Diagnosis of  By: Wynona Luna   . Routine general medical examination at a health care facility   . SCIATICA, RIGHT 05/06/2008   Qualifier: Diagnosis of  By: Nathaneil Canary, CMA, Sarah    . Stroke ([place])   . THROMBOCYTOPENIA 02/09/2009   Qualifier: Diagnosis of  By: Wynona Luna   . UNSTEADY GAIT 07/28/2009   Qualifier: Diagnosis of  By: Wynona Luna   . VENOUS INSUFFICIENCY 05/03/2008   Qualifier: Diagnosis of  By: Julien Girt CMA, Marliss Czar      Past Surgical History:  Procedure Laterality Date  . ABDOMINAL HYSTERECTOMY    . ANEURYSM COILING    .  APPENDECTOMY    . CHOLECYSTECTOMY    . JOINT REPLACEMENT     total right knee replacement    There were no vitals filed for this visit.  Subjective Assessment - 12/24/17 1110    Subjective  Has not had a chance to go shoe shopping. Reports knee feels good even after doing a lot of walking this weekend. I was a little sore after last visit but I had to keep moving so it didn't last too long.     How long can you stand comfortably?  no problem, sometimes I think i need more balance but otherwise no problem    Currently in Pain?  Yes    Pain Score  1     Pain Location  Knee    Pain Orientation  Right    Pain Descriptors / Indicators  Tingling                      [facility] Adult PT Treatment/Exercise - 12/24/17 0001      Knee/Hip Exercises: Stretches   Passive Hamstring Stretch  Both;2 reps;30 seconds    Gastroc Stretch  Both;30 seconds      Knee/Hip Exercises: Aerobic   Nustep  5 min L4 LE only      Knee/Hip Exercises: Standing   Lateral Step Up  Step Height: 4";Other (comment) step taps    Forward Step Up  Step Height: 4";Other (comment) step taps    Other Standing Knee Exercises  in parallel bars: tandem, SLS,     Other Standing Knee Exercises  airex: NBOS, static stance, slow marching, heel raises      Knee/Hip Exercises: Seated   Sit to Sand  5 reps;with UE support      Moist Heat Therapy   Number Minutes Moist Heat  10 Minutes    Moist Heat Location  Knee               PT Short Term Goals - 12/11/17 1028      PT SHORT TERM GOAL #1   Title  Pt will be independent with HEP as it has been established and able to demonstrate proper form    Baseline  will progress as appropriate    Time  3    Period  Weeks    Status  New    Target Date  01/03/18        PT Long Term Goals - 12/11/17 1025      PT LONG TERM GOAL #1   Title  Pt will be able to stand from a standard height chair without use of upper extremities    Baseline  unable at eval but  reports she could before Thanksgiving    Time  6    Period  Weeks    Status  New    Target Date  01/24/18      PT LONG TERM GOAL #2   Title  Gross LE strength 4+/5 for support to biomechanical chain    Baseline  see flowsheet    Time  6    Period  Weeks    Status  New    Target Date  01/24/18      PT LONG TERM GOAL #3   Title  Pt will report ability to drive without limitation by Rt leg    Baseline  significant difficulty noted at eval    Time  6    Period  Weeks    Status  New    Target Date  01/24/18      PT LONG TERM GOAL #4   Title  FOTO to 52% limitation    Baseline  68% limitation at eval    Time  6    Period  Weeks    Status  New    Target Date  01/24/18            Plan - 12/24/17 1247    Clinical Impression Statement  Focused on standing balance challenges today which pt tolerated well but was nervous. The shoes she wears consistently have a small wedge making challenges a little more difficult but we proceeded since these are the shoes she is wearing on a regular basis. Reported "I felt like I had a workout" after treatment. Unable to stand without use of UE today and will continue to challenge.     PT Treatment/Interventions  ADLs/Self Care Home Management;Cryotherapy;Electrical Stimulation;Ultrasound;Moist Heat;Gait training;Stair training;Functional mobility training;Therapeutic exercise;Balance training;Therapeutic activities;Patient/family education;Neuromuscular re-education;Manual techniques;Scar mobilization;Passive range of motion;Taping;Dry needling    PT Next Visit Plan  [facility] quad strength, gait with obstacles    PT Home Exercise Plan  hooklying clam & ball squeeze, LAQ, roller to quads, Thomas stretch, sit<>stand, seated HSS; SLR, bridge, heel raises    Consulted and Agree with Plan of Care  Patient       Patient will benefit from skilled therapeutic intervention in order to improve the following deficits and impairments:  Abnormal gait, Improper body  mechanics, Pain, Increased muscle spasms, Decreased scar mobility, Decreased activity tolerance, Decreased range of motion, Decreased strength, Impaired flexibility, Difficulty walking  Visit Diagnosis: Acute pain of right knee  Pain in right leg  Joint stiffness of right lower leg  Muscle weakness (generalized)     Problem List Patient Active Problem List   Diagnosis Date Noted  . Cerebral embolism with cerebral infarction ([place]) 07/10/2013  . Headache(784.0) 03/15/2011  . HYPOGLYCEMIA 10/24/2010  . BACK PAIN, LUMBAR 03/06/2010  . CONSTIPATION, CHRONIC 09/20/2009  . SINUSITIS, CHRONIC 08/16/2009  . UNSTEADY GAIT 07/28/2009  . INSOMNIA, CHRONIC 07/19/2009  . THROMBOCYTOPENIA 02/09/2009  . LEG PAIN, RIGHT 02/01/2009  . Other abnormal glucose 02/01/2009  . CHEST PAIN, ATYPICAL 09/02/2008  . GASTRITIS, HX OF 08/25/2008  . HYPOTHYROIDISM 07/22/2008  . DIVERTICULOSIS-COLON 06/09/2008  . SCIATICA, RIGHT 05/06/2008  . VENOUS INSUFFICIENCY 05/03/2008  . GERD  05/03/2008  . NECK PAIN 02/03/2008  . NUMBNESS 01/29/2008  . PULMONARY NODULE 12/26/2007  . HYPERLIPIDEMIA 06/21/2007  . ANEMIA-NOS 06/21/2007  . LEUKOPENIA, CHRONIC 06/21/2007  . ANXIETY 06/21/2007  . DEPRESSION 06/21/2007  . HYPERTENSION 06/21/2007  . CEREBRAL ANEURYSM 06/21/2007  . ALLERGIC RHINITIS 06/21/2007  . IBS 06/21/2007  . Osteoarthrosis, unspecified whether generalized or localized, unspecified site 06/21/2007  . FIBROMYALGIA 06/21/2007  . CEREBROVASCULAR ACCIDENT, HX OF 06/21/2007    Robson Trickey C. Comer Devins PT, DPT 12/24/17 12:52 PM   [facility] [address] [place], [place], [address] Phone: [phone]   Fax:  [phone]  Name: KAMRIN SPATH MRN: [id number] Date of Birth: Jun 10, 1931

## 2017-12-27 ENCOUNTER — Ambulatory Visit: Payer: Medicare Other | Attending: Orthopaedic Surgery | Admitting: Physical Therapy

## 2017-12-27 ENCOUNTER — Encounter: Payer: Self-pay | Admitting: Physical Therapy

## 2017-12-27 DIAGNOSIS — M25661 Stiffness of right knee, not elsewhere classified: Secondary | ICD-10-CM

## 2017-12-27 DIAGNOSIS — M25561 Pain in right knee: Secondary | ICD-10-CM

## 2017-12-27 DIAGNOSIS — M79604 Pain in right leg: Secondary | ICD-10-CM | POA: Diagnosis present

## 2017-12-27 DIAGNOSIS — M6281 Muscle weakness (generalized): Secondary | ICD-10-CM | POA: Diagnosis present

## 2017-12-27 NOTE — Therapy (Signed)
[place], [place], [address] Phone: [phone]   Fax:  [phone]  Physical Therapy Treatment  Patient Details  Name: Angela Wang MRN: [id number] Date of Birth: 1931-05-17 Referring Provider: Leandrew Koyanagi, MD   Encounter Date: 12/27/2017  PT End of Session - 12/27/17 1106    Visit Number  5    Number of Visits  13    Date for PT Re-Evaluation  01/24/18    Authorization Type  [employer]    PT Start Time  1103    PT Stop Time  1154    PT Time Calculation (min)  51 min    Activity Tolerance  Patient tolerated treatment well    Behavior During Therapy  [facility] for tasks assessed/performed       Past Medical History:  Diagnosis Date  . ALLERGIC RHINITIS 06/21/2007   Qualifier: Diagnosis of  By: Wynona Luna   . Allergy    allergic rhinitis  . Anemia    nos  . ANEMIA-NOS 06/21/2007   Qualifier: Diagnosis of  By: Wynona Luna   . ANXIETY 06/21/2007   Qualifier: Diagnosis of  By: Wynona Luna   . Arthritis    osteoarthritis  . Atypical chest pain 09/2008   negative myoview  . BACK PAIN, LUMBAR 03/06/2010   Qualifier: Diagnosis of  By: Wynona Luna   . CEREBRAL ANEURYSM 06/21/2007   Qualifier: History of  By: Wynona Luna   . CEREBROVASCULAR ACCIDENT, HX OF 06/21/2007   Qualifier: Diagnosis of  By: Wynona Luna   . CHEST PAIN, ATYPICAL 09/02/2008   Qualifier: Diagnosis of  By: Wynona Luna   . Chronic sinusitis   . CONSTIPATION, CHRONIC 09/20/2009   Qualifier: Diagnosis of  By: Olevia Perches MD, Lowella Bandy   . Depression   . DEPRESSION 06/21/2007   Qualifier: Diagnosis of  By: Wynona Luna   . Diverticulosis   . DIVERTICULOSIS-COLON 06/09/2008   Qualifier: Diagnosis of  By: Chester Holstein NP, Nevin Bloodgood    . Fibromyalgia   . FIBROMYALGIA 06/21/2007   Qualifier: Diagnosis of  By: Wynona Luna   . GASTRITIS, HX OF 08/25/2008   Qualifier: Diagnosis of  By: Nelson-Smith CMA (AAMA),  Dottie    . GERD 05/03/2008   Qualifier: Diagnosis of  By: Julien Girt CMA, Marliss Czar    . GERD (gastroesophageal reflux disease)   . Headache 03/15/2011  . History of solitary pulmonary nodule    right, stable by CT  . Hyperlipidemia   . HYPERLIPIDEMIA 06/21/2007   Qualifier: Diagnosis of  By: Wynona Luna   . Hypertension   . HYPERTENSION 06/21/2007   Qualifier: Diagnosis of  By: Wynona Luna   . HYPOGLYCEMIA 10/24/2010   Qualifier: Diagnosis of  By: Wynona Luna   . HYPOTHYROIDISM 07/22/2008   Qualifier: Diagnosis of  By: Wynona Luna   . IBS 06/21/2007   Qualifier: Diagnosis of  By: Wynona Luna   . IBS (irritable bowel syndrome)   . INSOMNIA, CHRONIC 07/19/2009   Qualifier: Diagnosis of  By: Wynona Luna   . LEG PAIN, RIGHT 02/01/2009   Qualifier: Diagnosis of  By: Wynona Luna   . Leukopenia    chronic  . LEUKOPENIA, CHRONIC 06/21/2007   Qualifier: Diagnosis of  By: Wynona Luna   . NECK  PAIN 02/03/2008   Qualifier: Diagnosis of  By: Wynona Luna   . NUMBNESS 01/29/2008   Qualifier: Diagnosis of  By: Loanne Drilling MD, Jacelyn Pi   . Docia Furl NOS-Unspec 06/21/2007   Centricity Description: DEGENERATIVE JOINT DISEASE, MILD Qualifier: Diagnosis of  By: Wynona Luna  Centricity Description: OSTEOARTHRITIS Qualifier: Diagnosis of  By: Wynona Luna   . PULMONARY NODULE 12/26/2007   Qualifier: Diagnosis of  By: Wynona Luna   . Routine general medical examination at a health care facility   . SCIATICA, RIGHT 05/06/2008   Qualifier: Diagnosis of  By: Nathaneil Canary, CMA, Sarah    . Stroke ([place])   . THROMBOCYTOPENIA 02/09/2009   Qualifier: Diagnosis of  By: Wynona Luna   . UNSTEADY GAIT 07/28/2009   Qualifier: Diagnosis of  By: Wynona Luna   . VENOUS INSUFFICIENCY 05/03/2008   Qualifier: Diagnosis of  By: Julien Girt CMA, Marliss Czar      Past Surgical History:  Procedure Laterality Date  . ABDOMINAL HYSTERECTOMY    . ANEURYSM COILING    . APPENDECTOMY     . CHOLECYSTECTOMY    . JOINT REPLACEMENT     total right knee replacement    There were no vitals filed for this visit.  Subjective Assessment - 12/27/17 1107    Subjective  knee is feeling pretty good today. Has not gone shoe shopping, it has been too cold     How long can you stand comfortably?  no problem, sometimes I think i need more balance but otherwise no problem                      [facility] Adult PT Treatment/Exercise - 12/27/17 0001      Knee/Hip Exercises: Stretches   Passive Hamstring Stretch  Both;2 reps;30 seconds    Hip Flexor Stretch Limitations  thomas test stretch    Knee: Self-Stretch Limitations  heel slide with green strap    Gastroc Stretch  Both;30 seconds      Knee/Hip Exercises: Aerobic   Nustep  6 min LE only L4      Knee/Hip Exercises: Standing   Gait Training  with obstacles      Knee/Hip Exercises: Seated   Sit to Sand  5 reps;with UE support      Knee/Hip Exercises: Supine   Bridges with [employer]  15 reps    Straight Leg Raises  15 reps;Both    Straight Leg Raise with External Rotation  Both;10 reps      Moist Heat Therapy   Number Minutes Moist Heat  10 Minutes    Moist Heat Location  Knee             PT Education - 12/27/17 1134    Education provided  Yes    Education Details  transition to independent program, exercise form/rationale    Person(s) Educated  Patient    Methods  Explanation;Demonstration;Tactile cues;Verbal cues    Comprehension  Verbalized understanding;Need further instruction;Returned demonstration;Verbal cues required;Tactile cues required       PT Short Term Goals - 12/11/17 1028      PT SHORT TERM GOAL #1   Title  Pt will be independent with HEP as it has been established and able to demonstrate proper form    Baseline  will progress as appropriate    Time  3    Period  Weeks    Status  New  Target Date  01/03/18        PT Long Term Goals - 12/11/17 1025      PT LONG TERM  GOAL #1   Title  Pt will be able to stand from a standard height chair without use of upper extremities    Baseline  unable at eval but reports she could before Thanksgiving    Time  6    Period  Weeks    Status  New    Target Date  01/24/18      PT LONG TERM GOAL #2   Title  Gross LE strength 4+/5 for support to biomechanical chain    Baseline  see flowsheet    Time  6    Period  Weeks    Status  New    Target Date  01/24/18      PT LONG TERM GOAL #3   Title  Pt will report ability to drive without limitation by Rt leg    Baseline  significant difficulty noted at eval    Time  6    Period  Weeks    Status  New    Target Date  01/24/18      PT LONG TERM GOAL #4   Title  FOTO to 52% limitation    Baseline  68% limitation at eval    Time  6    Period  Weeks    Status  New    Target Date  01/24/18            Plan - 12/27/17 1145    Clinical Impression Statement  Will continue for 2 more weeks of PT and then pt will transition to independent program. Pt is fearful that she will not continue if she is not held accountable. Knows of a place in [place] that she enjoyed exercising at and will look into it.     PT Treatment/Interventions  ADLs/Self Care Home Management;Cryotherapy;Electrical Stimulation;Ultrasound;Moist Heat;Gait training;Stair training;Functional mobility training;Therapeutic exercise;Balance training;Therapeutic activities;Patient/family education;Neuromuscular re-education;Manual techniques;Scar mobilization;Passive range of motion;Taping;Dry needling    PT Next Visit Plan  did she look into exercise in [place], pt wants to focus on knee    PT Home Exercise Plan  hooklying clam & ball squeeze, LAQ, roller to quads, Thomas stretch, sit<>stand, seated HSS; SLR, bridge, heel raises    Consulted and Agree with Plan of Care  Patient       Patient will benefit from skilled therapeutic intervention in order to improve the following deficits and impairments:   Abnormal gait, Improper body mechanics, Pain, Increased muscle spasms, Decreased scar mobility, Decreased activity tolerance, Decreased range of motion, Decreased strength, Impaired flexibility, Difficulty walking  Visit Diagnosis: Acute pain of right knee  Pain in right leg  Joint stiffness of right lower leg  Muscle weakness (generalized)     Problem List Patient Active Problem List   Diagnosis Date Noted  . Cerebral embolism with cerebral infarction ([place]) 07/10/2013  . Headache(784.0) 03/15/2011  . HYPOGLYCEMIA 10/24/2010  . BACK PAIN, LUMBAR 03/06/2010  . CONSTIPATION, CHRONIC 09/20/2009  . SINUSITIS, CHRONIC 08/16/2009  . UNSTEADY GAIT 07/28/2009  . INSOMNIA, CHRONIC 07/19/2009  . THROMBOCYTOPENIA 02/09/2009  . LEG PAIN, RIGHT 02/01/2009  . Other abnormal glucose 02/01/2009  . CHEST PAIN, ATYPICAL 09/02/2008  . GASTRITIS, HX OF 08/25/2008  . HYPOTHYROIDISM 07/22/2008  . DIVERTICULOSIS-COLON 06/09/2008  . SCIATICA, RIGHT 05/06/2008  . VENOUS INSUFFICIENCY 05/03/2008  . GERD 05/03/2008  . NECK PAIN 02/03/2008  .  NUMBNESS 01/29/2008  . PULMONARY NODULE 12/26/2007  . HYPERLIPIDEMIA 06/21/2007  . ANEMIA-NOS 06/21/2007  . LEUKOPENIA, CHRONIC 06/21/2007  . ANXIETY 06/21/2007  . DEPRESSION 06/21/2007  . HYPERTENSION 06/21/2007  . CEREBRAL ANEURYSM 06/21/2007  . ALLERGIC RHINITIS 06/21/2007  . IBS 06/21/2007  . Osteoarthrosis, unspecified whether generalized or localized, unspecified site 06/21/2007  . FIBROMYALGIA 06/21/2007  . CEREBROVASCULAR ACCIDENT, HX OF 06/21/2007    Achaia Garlock C. Jaiana Sheffer PT, DPT 12/27/17 11:50 AM   [facility] [address] [place], [place], [address] Phone: [phone]   Fax:  [phone]  Name: Angela Wang MRN: [id number] Date of Birth: 1930/12/08

## 2017-12-30 ENCOUNTER — Ambulatory Visit: Payer: Medicare Other | Admitting: Physical Therapy

## 2017-12-30 ENCOUNTER — Encounter: Payer: Self-pay | Admitting: Physical Therapy

## 2017-12-30 DIAGNOSIS — M25561 Pain in right knee: Secondary | ICD-10-CM | POA: Diagnosis not present

## 2017-12-30 DIAGNOSIS — M25661 Stiffness of right knee, not elsewhere classified: Secondary | ICD-10-CM

## 2017-12-30 DIAGNOSIS — M6281 Muscle weakness (generalized): Secondary | ICD-10-CM

## 2017-12-30 DIAGNOSIS — M79604 Pain in right leg: Secondary | ICD-10-CM

## 2017-12-30 NOTE — Therapy (Signed)
[place], [place], [address] Phone: [phone]   Fax:  [phone]  Physical Therapy Treatment  Patient Details  Name: Angela Wang MRN: [id number] Date of Birth: 1931/09/14 Referring Provider: Leandrew Koyanagi, MD   Encounter Date: 12/30/2017  PT End of Session - 12/30/17 1332    Visit Number  6    Number of Visits  13    Date for PT Re-Evaluation  01/24/18    Authorization Type  [employer]    PT Start Time  1330    PT Stop Time  1420    PT Time Calculation (min)  50 min    Activity Tolerance  Patient tolerated treatment well    Behavior During Therapy  [facility] for tasks assessed/performed       Past Medical History:  Diagnosis Date  . ALLERGIC RHINITIS 06/21/2007   Qualifier: Diagnosis of  By: Wynona Luna   . Allergy    allergic rhinitis  . Anemia    nos  . ANEMIA-NOS 06/21/2007   Qualifier: Diagnosis of  By: Wynona Luna   . ANXIETY 06/21/2007   Qualifier: Diagnosis of  By: Wynona Luna   . Arthritis    osteoarthritis  . Atypical chest pain 09/2008   negative myoview  . BACK PAIN, LUMBAR 03/06/2010   Qualifier: Diagnosis of  By: Wynona Luna   . CEREBRAL ANEURYSM 06/21/2007   Qualifier: History of  By: Wynona Luna   . CEREBROVASCULAR ACCIDENT, HX OF 06/21/2007   Qualifier: Diagnosis of  By: Wynona Luna   . CHEST PAIN, ATYPICAL 09/02/2008   Qualifier: Diagnosis of  By: Wynona Luna   . Chronic sinusitis   . CONSTIPATION, CHRONIC 09/20/2009   Qualifier: Diagnosis of  By: Olevia Perches MD, Lowella Bandy   . Depression   . DEPRESSION 06/21/2007   Qualifier: Diagnosis of  By: Wynona Luna   . Diverticulosis   . DIVERTICULOSIS-COLON 06/09/2008   Qualifier: Diagnosis of  By: Chester Holstein NP, Nevin Bloodgood    . Fibromyalgia   . FIBROMYALGIA 06/21/2007   Qualifier: Diagnosis of  By: Wynona Luna   . GASTRITIS, HX OF 08/25/2008   Qualifier: Diagnosis of  By: Nelson-Smith CMA (AAMA),  Dottie    . GERD 05/03/2008   Qualifier: Diagnosis of  By: Julien Girt CMA, Marliss Czar    . GERD (gastroesophageal reflux disease)   . Headache 03/15/2011  . History of solitary pulmonary nodule    right, stable by CT  . Hyperlipidemia   . HYPERLIPIDEMIA 06/21/2007   Qualifier: Diagnosis of  By: Wynona Luna   . Hypertension   . HYPERTENSION 06/21/2007   Qualifier: Diagnosis of  By: Wynona Luna   . HYPOGLYCEMIA 10/24/2010   Qualifier: Diagnosis of  By: Wynona Luna   . HYPOTHYROIDISM 07/22/2008   Qualifier: Diagnosis of  By: Wynona Luna   . IBS 06/21/2007   Qualifier: Diagnosis of  By: Wynona Luna   . IBS (irritable bowel syndrome)   . INSOMNIA, CHRONIC 07/19/2009   Qualifier: Diagnosis of  By: Wynona Luna   . LEG PAIN, RIGHT 02/01/2009   Qualifier: Diagnosis of  By: Wynona Luna   . Leukopenia    chronic  . LEUKOPENIA, CHRONIC 06/21/2007   Qualifier: Diagnosis of  By: Wynona Luna   . NECK  PAIN 02/03/2008   Qualifier: Diagnosis of  By: Wynona Luna   . NUMBNESS 01/29/2008   Qualifier: Diagnosis of  By: Loanne Drilling MD, Jacelyn Pi   . Docia Furl NOS-Unspec 06/21/2007   Centricity Description: DEGENERATIVE JOINT DISEASE, MILD Qualifier: Diagnosis of  By: Wynona Luna  Centricity Description: OSTEOARTHRITIS Qualifier: Diagnosis of  By: Wynona Luna   . PULMONARY NODULE 12/26/2007   Qualifier: Diagnosis of  By: Wynona Luna   . Routine general medical examination at a health care facility   . SCIATICA, RIGHT 05/06/2008   Qualifier: Diagnosis of  By: Nathaneil Canary, CMA, Sarah    . Stroke ([place])   . THROMBOCYTOPENIA 02/09/2009   Qualifier: Diagnosis of  By: Wynona Luna   . UNSTEADY GAIT 07/28/2009   Qualifier: Diagnosis of  By: Wynona Luna   . VENOUS INSUFFICIENCY 05/03/2008   Qualifier: Diagnosis of  By: Julien Girt CMA, Marliss Czar      Past Surgical History:  Procedure Laterality Date  . ABDOMINAL HYSTERECTOMY    . ANEURYSM COILING    . APPENDECTOMY     . CHOLECYSTECTOMY    . JOINT REPLACEMENT     total right knee replacement    There were no vitals filed for this visit.  Subjective Assessment - 12/30/17 1332    Subjective  Knee is kind of sore today. This other knee (Left) is starting to crack, like the bone is moving around.    How long can you stand comfortably?  no problem, sometimes I think i need more balance but otherwise no problem         [facility] PT Assessment - 12/30/17 0001      AROM   Right Knee Flexion  105      Strength   Right Hip Flexion  4-/5    Right Knee Flexion  5/5    Right Knee Extension  4+/5                  [facility] Adult PT Treatment/Exercise - 12/30/17 0001      Knee/Hip Exercises: Stretches   Passive Hamstring Stretch  Both;2 reps;30 seconds also with strap later in session    Knee: Self-Stretch Limitations  heel slide with green strap    Gastroc Stretch  Both;30 seconds;2 reps      Knee/Hip Exercises: Aerobic   Nustep  L5 LE only 5 min      Knee/Hip Exercises: Supine   Short Arc Quad Sets  Both;20 reps 3#    Bridges with [employer]  15 reps multiple breaks for hamstring cramps      Knee/Hip Exercises: Sidelying   Clams  x30 each      Moist Heat Therapy   Number Minutes Moist Heat  10 Minutes    Moist Heat Location  Knee             PT Education - 12/30/17 1413    Education provided  Yes    Education Details  exercise form/rationale, HEP, progress    Person(s) Educated  Patient    Methods  Explanation;Demonstration;Tactile cues;Verbal cues;Handout    Comprehension  Verbalized understanding;Need further instruction;Returned demonstration;Verbal cues required;Tactile cues required       PT Short Term Goals - 12/11/17 1028      PT SHORT TERM GOAL #1   Title  Pt will be independent with HEP as it has been established and able to demonstrate proper form  Baseline  will progress as appropriate    Time  3    Period  Weeks    Status  New    Target Date  01/03/18         PT Long Term Goals - 12/11/17 1025      PT LONG TERM GOAL #1   Title  Pt will be able to stand from a standard height chair without use of upper extremities    Baseline  unable at eval but reports she could before Thanksgiving    Time  6    Period  Weeks    Status  New    Target Date  01/24/18      PT LONG TERM GOAL #2   Title  Gross LE strength 4+/5 for support to biomechanical chain    Baseline  see flowsheet    Time  6    Period  Weeks    Status  New    Target Date  01/24/18      PT LONG TERM GOAL #3   Title  Pt will report ability to drive without limitation by Rt leg    Baseline  significant difficulty noted at eval    Time  6    Period  Weeks    Status  New    Target Date  01/24/18      PT LONG TERM GOAL #4   Title  FOTO to 52% limitation    Baseline  68% limitation at eval    Time  6    Period  Weeks    Status  New    Target Date  01/24/18            Plan - 12/30/17 1411    Clinical Impression Statement  Good tolerance to exercise. Rest breaks required in clams for fatigue and in bridges for hamstring cramping in Rt leg. Pt has made progress in strength and ROM. Pt states "I need to do more at home" and was encouarged by progress.     PT Treatment/Interventions  ADLs/Self Care Home Management;Cryotherapy;Electrical Stimulation;Ultrasound;Moist Heat;Gait training;Stair training;Functional mobility training;Therapeutic exercise;Balance training;Therapeutic activities;Patient/family education;Neuromuscular re-education;Manual techniques;Scar mobilization;Passive range of motion;Taping;Dry needling    PT Next Visit Plan  pt wants to focus on knee, side stepping, mini wall slides    PT Home Exercise Plan  hooklying clam & ball squeeze, LAQ, roller to quads, Thomas stretch, sit<>stand, seated HSS; SLR, bridge, heel raises; clam, SAQ    Consulted and Agree with Plan of Care  Patient       Patient will benefit from skilled therapeutic intervention in order  to improve the following deficits and impairments:  Abnormal gait, Improper body mechanics, Pain, Increased muscle spasms, Decreased scar mobility, Decreased activity tolerance, Decreased range of motion, Decreased strength, Impaired flexibility, Difficulty walking  Visit Diagnosis: Acute pain of right knee  Pain in right leg  Joint stiffness of right lower leg  Muscle weakness (generalized)     Problem List Patient Active Problem List   Diagnosis Date Noted  . Cerebral embolism with cerebral infarction ([place]) 07/10/2013  . Headache(784.0) 03/15/2011  . HYPOGLYCEMIA 10/24/2010  . BACK PAIN, LUMBAR 03/06/2010  . CONSTIPATION, CHRONIC 09/20/2009  . SINUSITIS, CHRONIC 08/16/2009  . UNSTEADY GAIT 07/28/2009  . INSOMNIA, CHRONIC 07/19/2009  . THROMBOCYTOPENIA 02/09/2009  . LEG PAIN, RIGHT 02/01/2009  . Other abnormal glucose 02/01/2009  . CHEST PAIN, ATYPICAL 09/02/2008  . GASTRITIS, HX OF 08/25/2008  . HYPOTHYROIDISM 07/22/2008  . DIVERTICULOSIS-COLON 06/09/2008  .  SCIATICA, RIGHT 05/06/2008  . VENOUS INSUFFICIENCY 05/03/2008  . GERD 05/03/2008  . NECK PAIN 02/03/2008  . NUMBNESS 01/29/2008  . PULMONARY NODULE 12/26/2007  . HYPERLIPIDEMIA 06/21/2007  . ANEMIA-NOS 06/21/2007  . LEUKOPENIA, CHRONIC 06/21/2007  . ANXIETY 06/21/2007  . DEPRESSION 06/21/2007  . HYPERTENSION 06/21/2007  . CEREBRAL ANEURYSM 06/21/2007  . ALLERGIC RHINITIS 06/21/2007  . IBS 06/21/2007  . Osteoarthrosis, unspecified whether generalized or localized, unspecified site 06/21/2007  . FIBROMYALGIA 06/21/2007  . CEREBROVASCULAR ACCIDENT, HX OF 06/21/2007   Daritza Brees C. Shivangi Lutz PT, DPT 12/30/17 2:13 PM   [facility] [address] [place], [place], [address] Phone: [phone]   Fax:  [phone]  Name: Angela Wang MRN: [id number] Date of Birth: 1931-04-12

## 2018-01-02 ENCOUNTER — Encounter: Payer: Self-pay | Admitting: Physical Therapy

## 2018-01-02 ENCOUNTER — Ambulatory Visit: Payer: Medicare Other | Admitting: Physical Therapy

## 2018-01-02 DIAGNOSIS — M6281 Muscle weakness (generalized): Secondary | ICD-10-CM

## 2018-01-02 DIAGNOSIS — M25561 Pain in right knee: Secondary | ICD-10-CM | POA: Diagnosis not present

## 2018-01-02 DIAGNOSIS — M79604 Pain in right leg: Secondary | ICD-10-CM

## 2018-01-02 DIAGNOSIS — M25661 Stiffness of right knee, not elsewhere classified: Secondary | ICD-10-CM

## 2018-01-02 NOTE — Therapy (Signed)
[place], [place], [address] Phone: [phone]   Fax:  [phone]  Physical Therapy Treatment  Patient Details  Name: Angela Wang MRN: [id number] Date of Birth: 06/02/1931 Referring Provider: Leandrew Koyanagi, MD   Encounter Date: 01/02/2018  PT End of Session - 01/02/18 1057    Visit Number  7    Number of Visits  13    Date for PT Re-Evaluation  01/24/18    Authorization Type  [employer]    PT Start Time  1100    PT Stop Time  1150    PT Time Calculation (min)  50 min    Activity Tolerance  Patient tolerated treatment well    Behavior During Therapy  [facility] for tasks assessed/performed       Past Medical History:  Diagnosis Date  . ALLERGIC RHINITIS 06/21/2007   Qualifier: Diagnosis of  By: Wynona Luna   . Allergy    allergic rhinitis  . Anemia    nos  . ANEMIA-NOS 06/21/2007   Qualifier: Diagnosis of  By: Wynona Luna   . ANXIETY 06/21/2007   Qualifier: Diagnosis of  By: Wynona Luna   . Arthritis    osteoarthritis  . Atypical chest pain 09/2008   negative myoview  . BACK PAIN, LUMBAR 03/06/2010   Qualifier: Diagnosis of  By: Wynona Luna   . CEREBRAL ANEURYSM 06/21/2007   Qualifier: History of  By: Wynona Luna   . CEREBROVASCULAR ACCIDENT, HX OF 06/21/2007   Qualifier: Diagnosis of  By: Wynona Luna   . CHEST PAIN, ATYPICAL 09/02/2008   Qualifier: Diagnosis of  By: Wynona Luna   . Chronic sinusitis   . CONSTIPATION, CHRONIC 09/20/2009   Qualifier: Diagnosis of  By: Olevia Perches MD, Lowella Bandy   . Depression   . DEPRESSION 06/21/2007   Qualifier: Diagnosis of  By: Wynona Luna   . Diverticulosis   . DIVERTICULOSIS-COLON 06/09/2008   Qualifier: Diagnosis of  By: Chester Holstein NP, Nevin Bloodgood    . Fibromyalgia   . FIBROMYALGIA 06/21/2007   Qualifier: Diagnosis of  By: Wynona Luna   . GASTRITIS, HX OF 08/25/2008   Qualifier: Diagnosis of  By: Nelson-Smith CMA (AAMA),  Dottie    . GERD 05/03/2008   Qualifier: Diagnosis of  By: Julien Girt CMA, Marliss Czar    . GERD (gastroesophageal reflux disease)   . Headache 03/15/2011  . History of solitary pulmonary nodule    right, stable by CT  . Hyperlipidemia   . HYPERLIPIDEMIA 06/21/2007   Qualifier: Diagnosis of  By: Wynona Luna   . Hypertension   . HYPERTENSION 06/21/2007   Qualifier: Diagnosis of  By: Wynona Luna   . HYPOGLYCEMIA 10/24/2010   Qualifier: Diagnosis of  By: Wynona Luna   . HYPOTHYROIDISM 07/22/2008   Qualifier: Diagnosis of  By: Wynona Luna   . IBS 06/21/2007   Qualifier: Diagnosis of  By: Wynona Luna   . IBS (irritable bowel syndrome)   . INSOMNIA, CHRONIC 07/19/2009   Qualifier: Diagnosis of  By: Wynona Luna   . LEG PAIN, RIGHT 02/01/2009   Qualifier: Diagnosis of  By: Wynona Luna   . Leukopenia    chronic  . LEUKOPENIA, CHRONIC 06/21/2007   Qualifier: Diagnosis of  By: Wynona Luna   . NECK  PAIN 02/03/2008   Qualifier: Diagnosis of  By: Wynona Luna   . NUMBNESS 01/29/2008   Qualifier: Diagnosis of  By: Loanne Drilling MD, Jacelyn Pi   . Docia Furl NOS-Unspec 06/21/2007   Centricity Description: DEGENERATIVE JOINT DISEASE, MILD Qualifier: Diagnosis of  By: Wynona Luna  Centricity Description: OSTEOARTHRITIS Qualifier: Diagnosis of  By: Wynona Luna   . PULMONARY NODULE 12/26/2007   Qualifier: Diagnosis of  By: Wynona Luna   . Routine general medical examination at a health care facility   . SCIATICA, RIGHT 05/06/2008   Qualifier: Diagnosis of  By: Nathaneil Canary, CMA, Sarah    . Stroke ([place])   . THROMBOCYTOPENIA 02/09/2009   Qualifier: Diagnosis of  By: Wynona Luna   . UNSTEADY GAIT 07/28/2009   Qualifier: Diagnosis of  By: Wynona Luna   . VENOUS INSUFFICIENCY 05/03/2008   Qualifier: Diagnosis of  By: Julien Girt CMA, Marliss Czar      Past Surgical History:  Procedure Laterality Date  . ABDOMINAL HYSTERECTOMY    . ANEURYSM COILING    . APPENDECTOMY     . CHOLECYSTECTOMY    . JOINT REPLACEMENT     total right knee replacement    There were no vitals filed for this visit.  Subjective Assessment - 01/02/18 1103    Subjective  R knee is stiff today, 6/10 stiffness. feels like walking a little better with the new shoes.          [facility] PT Assessment - 01/02/18 0001      AROM   AROM Assessment Site  Knee    Right/Left Knee  Right    Right Knee Flexion  103                  [facility] Adult PT Treatment/Exercise - 01/02/18 0001      Knee/Hip Exercises: Stretches   Passive Hamstring Stretch  Both;10 seconds    Knee: Self-Stretch Limitations  heel slide with green strap 10x10"       Knee/Hip Exercises: Aerobic   Nustep  LE only 5 min      Knee/Hip Exercises: Standing   Stairs  5 times up/down, focus on R leg support descending stairs      Knee/Hip Exercises: Seated   Sit to Sand  5 reps with UE support, Eccentric control to sit      Knee/Hip Exercises: Supine   Short Arc Quad Sets  Both;10 reps    Bridges with [employer]  15 reps    Straight Leg Raises  15 reps;Both      Moist Heat Therapy   Number Minutes Moist Heat  10 Minutes    Moist Heat Location  Knee               PT Short Term Goals - 12/11/17 1028      PT SHORT TERM GOAL #1   Title  Pt will be independent with HEP as it has been established and able to demonstrate proper form    Baseline  will progress as appropriate    Time  3    Period  Weeks    Status  New    Target Date  01/03/18        PT Long Term Goals - 12/11/17 1025      PT LONG TERM GOAL #1   Title  Pt will be able to stand from a standard height chair without use of upper extremities  Baseline  unable at eval but reports she could before Thanksgiving    Time  6    Period  Weeks    Status  New    Target Date  01/24/18      PT LONG TERM GOAL #2   Title  Gross LE strength 4+/5 for support to biomechanical chain    Baseline  see flowsheet    Time  6    Period  Weeks     Status  New    Target Date  01/24/18      PT LONG TERM GOAL #3   Title  Pt will report ability to drive without limitation by Rt leg    Baseline  significant difficulty noted at eval    Time  6    Period  Weeks    Status  New    Target Date  01/24/18      PT LONG TERM GOAL #4   Title  FOTO to 52% limitation    Baseline  68% limitation at eval    Time  6    Period  Weeks    Status  New    Target Date  01/24/18            Plan - 01/02/18 1144    Clinical Impression Statement  Pt tolerated treatment well. Pt's ROM similar to last visit even though complained of increased stiffness today. Pt required minimal cuing to engage quads and straighten leg before SLR. Pt was able to ascend/descend stairs with R leg support reporting "it feels better". provided with handout for group therapy post d/c. Will d/c next week.     PT Treatment/Interventions  ADLs/Self Care Home Management;Cryotherapy;Electrical Stimulation;Ultrasound;Moist Heat;Gait training;Stair training;Functional mobility training;Therapeutic exercise;Balance training;Therapeutic activities;Patient/family education;Neuromuscular re-education;Manual techniques;Scar mobilization;Passive range of motion;Taping;Dry needling    PT Next Visit Plan  pt wants to focus on knee, side stepping, mini wall slides    PT Home Exercise Plan  hooklying clam & ball squeeze, LAQ, roller to quads, Thomas stretch, sit<>stand, seated HSS; SLR, bridge, heel raises; clam, SAQ       Patient will benefit from skilled therapeutic intervention in order to improve the following deficits and impairments:  Abnormal gait, Improper body mechanics, Pain, Increased muscle spasms, Decreased scar mobility, Decreased activity tolerance, Decreased range of motion, Decreased strength, Impaired flexibility, Difficulty walking  Visit Diagnosis: Acute pain of right knee  Pain in right leg  Joint stiffness of right lower leg  Muscle weakness  (generalized)     Problem List Patient Active Problem List   Diagnosis Date Noted  . Cerebral embolism with cerebral infarction ([place]) 07/10/2013  . Headache(784.0) 03/15/2011  . HYPOGLYCEMIA 10/24/2010  . BACK PAIN, LUMBAR 03/06/2010  . CONSTIPATION, CHRONIC 09/20/2009  . SINUSITIS, CHRONIC 08/16/2009  . UNSTEADY GAIT 07/28/2009  . INSOMNIA, CHRONIC 07/19/2009  . THROMBOCYTOPENIA 02/09/2009  . LEG PAIN, RIGHT 02/01/2009  . Other abnormal glucose 02/01/2009  . CHEST PAIN, ATYPICAL 09/02/2008  . GASTRITIS, HX OF 08/25/2008  . HYPOTHYROIDISM 07/22/2008  . DIVERTICULOSIS-COLON 06/09/2008  . SCIATICA, RIGHT 05/06/2008  . VENOUS INSUFFICIENCY 05/03/2008  . GERD 05/03/2008  . NECK PAIN 02/03/2008  . NUMBNESS 01/29/2008  . PULMONARY NODULE 12/26/2007  . HYPERLIPIDEMIA 06/21/2007  . ANEMIA-NOS 06/21/2007  . LEUKOPENIA, CHRONIC 06/21/2007  . ANXIETY 06/21/2007  . DEPRESSION 06/21/2007  . HYPERTENSION 06/21/2007  . CEREBRAL ANEURYSM 06/21/2007  . ALLERGIC RHINITIS 06/21/2007  . IBS 06/21/2007  . Osteoarthrosis, unspecified whether generalized or localized, unspecified site  06/21/2007  . FIBROMYALGIA 06/21/2007  . CEREBROVASCULAR ACCIDENT, HX OF 06/21/2007   Adlene Adduci C. Annielee Jemmott PT, DPT 01/02/18 1:01 PM   [facility] [address] [place], [place], [address] Phone: [phone]   Fax:  [phone]  Name: Angela Wang MRN: [id number] Date of Birth: 1931-09-18

## 2018-01-07 ENCOUNTER — Encounter: Payer: Self-pay | Admitting: Physical Therapy

## 2018-01-07 ENCOUNTER — Ambulatory Visit: Payer: Medicare Other | Admitting: Physical Therapy

## 2018-01-07 DIAGNOSIS — M79604 Pain in right leg: Secondary | ICD-10-CM

## 2018-01-07 DIAGNOSIS — M25561 Pain in right knee: Secondary | ICD-10-CM | POA: Diagnosis not present

## 2018-01-07 DIAGNOSIS — M6281 Muscle weakness (generalized): Secondary | ICD-10-CM

## 2018-01-07 DIAGNOSIS — M25661 Stiffness of right knee, not elsewhere classified: Secondary | ICD-10-CM

## 2018-01-07 NOTE — Therapy (Signed)
[place], [place], [address] Phone: [phone]   Fax:  [phone]  Physical Therapy Treatment  Patient Details  Name: Angela Wang MRN: [id number] Date of Birth: 1931-03-01 Referring Provider: Leandrew Koyanagi, MD   Encounter Date: 01/07/2018  PT End of Session - 01/07/18 1105    Visit Number  8    Number of Visits  13    Date for PT Re-Evaluation  01/24/18    Authorization Type  [employer]    PT Start Time  1100    PT Stop Time  1151    PT Time Calculation (min)  51 min    Activity Tolerance  Patient tolerated treatment well    Behavior During Therapy  [facility] for tasks assessed/performed       Past Medical History:  Diagnosis Date  . ALLERGIC RHINITIS 06/21/2007   Qualifier: Diagnosis of  By: Wynona Luna   . Allergy    allergic rhinitis  . Anemia    nos  . ANEMIA-NOS 06/21/2007   Qualifier: Diagnosis of  By: Wynona Luna   . ANXIETY 06/21/2007   Qualifier: Diagnosis of  By: Wynona Luna   . Arthritis    osteoarthritis  . Atypical chest pain 09/2008   negative myoview  . BACK PAIN, LUMBAR 03/06/2010   Qualifier: Diagnosis of  By: Wynona Luna   . CEREBRAL ANEURYSM 06/21/2007   Qualifier: History of  By: Wynona Luna   . CEREBROVASCULAR ACCIDENT, HX OF 06/21/2007   Qualifier: Diagnosis of  By: Wynona Luna   . CHEST PAIN, ATYPICAL 09/02/2008   Qualifier: Diagnosis of  By: Wynona Luna   . Chronic sinusitis   . CONSTIPATION, CHRONIC 09/20/2009   Qualifier: Diagnosis of  By: Olevia Perches MD, Lowella Bandy   . Depression   . DEPRESSION 06/21/2007   Qualifier: Diagnosis of  By: Wynona Luna   . Diverticulosis   . DIVERTICULOSIS-COLON 06/09/2008   Qualifier: Diagnosis of  By: Chester [place] NP, Nevin Bloodgood    . Fibromyalgia   . FIBROMYALGIA 06/21/2007   Qualifier: Diagnosis of  By: Wynona Luna   . GASTRITIS, HX OF 08/25/2008   Qualifier: Diagnosis of  By: Nelson-Smith CMA (AAMA),  Dottie    . GERD 05/03/2008   Qualifier: Diagnosis of  By: Julien Girt CMA, Marliss Czar    . GERD (gastroesophageal reflux disease)   . Headache 03/15/2011  . History of solitary pulmonary nodule    right, stable by CT  . Hyperlipidemia   . HYPERLIPIDEMIA 06/21/2007   Qualifier: Diagnosis of  By: Wynona Luna   . Hypertension   . HYPERTENSION 06/21/2007   Qualifier: Diagnosis of  By: Wynona Luna   . HYPOGLYCEMIA 10/24/2010   Qualifier: Diagnosis of  By: Wynona Luna   . HYPOTHYROIDISM 07/22/2008   Qualifier: Diagnosis of  By: Wynona Luna   . IBS 06/21/2007   Qualifier: Diagnosis of  By: Wynona Luna   . IBS (irritable bowel syndrome)   . INSOMNIA, CHRONIC 07/19/2009   Qualifier: Diagnosis of  By: Wynona Luna   . LEG PAIN, RIGHT 02/01/2009   Qualifier: Diagnosis of  By: Wynona Luna   . Leukopenia    chronic  . LEUKOPENIA, CHRONIC 06/21/2007   Qualifier: Diagnosis of  By: Wynona Luna   . NECK  PAIN 02/03/2008   Qualifier: Diagnosis of  By: Wynona Luna   . NUMBNESS 01/29/2008   Qualifier: Diagnosis of  By: Loanne Drilling MD, Jacelyn Pi   . Docia Furl NOS-Unspec 06/21/2007   Centricity Description: DEGENERATIVE JOINT DISEASE, MILD Qualifier: Diagnosis of  By: Wynona Luna  Centricity Description: OSTEOARTHRITIS Qualifier: Diagnosis of  By: Wynona Luna   . PULMONARY NODULE 12/26/2007   Qualifier: Diagnosis of  By: Wynona Luna   . Routine general medical examination at a health care facility   . SCIATICA, RIGHT 05/06/2008   Qualifier: Diagnosis of  By: Nathaneil Canary, CMA, Sarah    . Stroke ([place])   . THROMBOCYTOPENIA 02/09/2009   Qualifier: Diagnosis of  By: Wynona Luna   . UNSTEADY GAIT 07/28/2009   Qualifier: Diagnosis of  By: Wynona Luna   . VENOUS INSUFFICIENCY 05/03/2008   Qualifier: Diagnosis of  By: Julien Girt CMA, Marliss Czar      Past Surgical History:  Procedure Laterality Date  . ABDOMINAL HYSTERECTOMY    . ANEURYSM COILING    . APPENDECTOMY     . CHOLECYSTECTOMY    . JOINT REPLACEMENT     total right knee replacement    There were no vitals filed for this visit.                   [facility] Adult PT Treatment/Exercise - 01/07/18 0001      Knee/Hip Exercises: Stretches   Passive Hamstring Stretch  Both seated eOB    Hip Flexor Stretch Limitations  thomas test stretch      Knee/Hip Exercises: Aerobic   Stationary Bike  5 min L2      Knee/Hip Exercises: Seated   Sit to Sand  15 reps;with UE support;without UE support airex, attempted therapad      Knee/Hip Exercises: Supine   Short Arc Quad Sets  Right;10 reps    Straight Leg Raises  10 reps;Right    Straight Leg Raise with External Rotation  10 reps;Right      Moist Heat Therapy   Number Minutes Moist Heat  10 Minutes    Moist Heat Location  Knee      Manual Therapy   Soft tissue mobilization  roller Rt quads               PT Short Term Goals - 12/11/17 1028      PT SHORT TERM GOAL #1   Title  Pt will be independent with HEP as it has been established and able to demonstrate proper form    Baseline  will progress as appropriate    Time  3    Period  Weeks    Status  New    Target Date  01/03/18        PT Long Term Goals - 12/11/17 1025      PT LONG TERM GOAL #1   Title  Pt will be able to stand from a standard height chair without use of upper extremities    Baseline  unable at eval but reports she could before Thanksgiving    Time  6    Period  Weeks    Status  New    Target Date  01/24/18      PT LONG TERM GOAL #2   Title  Gross LE strength 4+/5 for support to biomechanical chain    Baseline  see flowsheet    Time  6  Period  Weeks    Status  New    Target Date  01/24/18      PT LONG TERM GOAL #3   Title  Pt will report ability to drive without limitation by Rt leg    Baseline  significant difficulty noted at eval    Time  6    Period  Weeks    Status  New    Target Date  01/24/18      PT LONG TERM GOAL #4    Title  FOTO to 52% limitation    Baseline  68% limitation at eval    Time  6    Period  Weeks    Status  New    Target Date  01/24/18            Plan - 01/07/18 1106    Clinical Impression Statement  Exercises on Rt leg only today due to pt fear that exercises at last visit bothered her Left knee. Was able to stand from airex pad on table without hands but unable with slightly smaller therapad.     PT Treatment/Interventions  ADLs/Self Care Home Management;Cryotherapy;Electrical Stimulation;Ultrasound;Moist Heat;Gait training;Stair training;Functional mobility training;Therapeutic exercise;Balance training;Therapeutic activities;Patient/family education;Neuromuscular re-education;Manual techniques;Scar mobilization;Passive range of motion;Taping;Dry needling    PT Next Visit Plan  d/c next visit to group therapy    PT Home Exercise Plan  hooklying clam & ball squeeze, LAQ, roller to quads, Thomas stretch, sit<>stand, seated HSS; SLR, bridge, heel raises; clam, SAQ    Consulted and Agree with Plan of Care  Patient       Patient will benefit from skilled therapeutic intervention in order to improve the following deficits and impairments:  Abnormal gait, Improper body mechanics, Pain, Increased muscle spasms, Decreased scar mobility, Decreased activity tolerance, Decreased range of motion, Decreased strength, Impaired flexibility, Difficulty walking  Visit Diagnosis: Acute pain of right knee  Pain in right leg  Joint stiffness of right lower leg  Muscle weakness (generalized)     Problem List Patient Active Problem List   Diagnosis Date Noted  . Cerebral embolism with cerebral infarction ([place]) 07/10/2013  . Headache(784.0) 03/15/2011  . HYPOGLYCEMIA 10/24/2010  . BACK PAIN, LUMBAR 03/06/2010  . CONSTIPATION, CHRONIC 09/20/2009  . SINUSITIS, CHRONIC 08/16/2009  . UNSTEADY GAIT 07/28/2009  . INSOMNIA, CHRONIC 07/19/2009  . THROMBOCYTOPENIA 02/09/2009  . LEG PAIN, RIGHT  02/01/2009  . Other abnormal glucose 02/01/2009  . CHEST PAIN, ATYPICAL 09/02/2008  . GASTRITIS, HX OF 08/25/2008  . HYPOTHYROIDISM 07/22/2008  . DIVERTICULOSIS-COLON 06/09/2008  . SCIATICA, RIGHT 05/06/2008  . VENOUS INSUFFICIENCY 05/03/2008  . GERD 05/03/2008  . NECK PAIN 02/03/2008  . NUMBNESS 01/29/2008  . PULMONARY NODULE 12/26/2007  . HYPERLIPIDEMIA 06/21/2007  . ANEMIA-NOS 06/21/2007  . LEUKOPENIA, CHRONIC 06/21/2007  . ANXIETY 06/21/2007  . DEPRESSION 06/21/2007  . HYPERTENSION 06/21/2007  . CEREBRAL ANEURYSM 06/21/2007  . ALLERGIC RHINITIS 06/21/2007  . IBS 06/21/2007  . Osteoarthrosis, unspecified whether generalized or localized, unspecified site 06/21/2007  . FIBROMYALGIA 06/21/2007  . CEREBROVASCULAR ACCIDENT, HX OF 06/21/2007   Adamarys Shall C. Arbor Leer PT, DPT 01/07/18 11:44 AM   [facility] [address] [place], [place], [address] Phone: [phone]   Fax:  [phone]  Name: Angela Wang MRN: [id number] Date of Birth: September 03, 1931

## 2018-01-09 ENCOUNTER — Other Ambulatory Visit: Payer: Self-pay | Admitting: Obstetrics and Gynecology

## 2018-01-09 ENCOUNTER — Ambulatory Visit: Payer: Medicare Other | Admitting: Physical Therapy

## 2018-01-09 ENCOUNTER — Encounter: Payer: Self-pay | Admitting: Physical Therapy

## 2018-01-09 DIAGNOSIS — M25561 Pain in right knee: Secondary | ICD-10-CM | POA: Diagnosis not present

## 2018-01-09 DIAGNOSIS — M6281 Muscle weakness (generalized): Secondary | ICD-10-CM

## 2018-01-09 DIAGNOSIS — Z1231 Encounter for screening mammogram for malignant neoplasm of breast: Secondary | ICD-10-CM

## 2018-01-09 DIAGNOSIS — M25661 Stiffness of right knee, not elsewhere classified: Secondary | ICD-10-CM

## 2018-01-09 DIAGNOSIS — M79604 Pain in right leg: Secondary | ICD-10-CM

## 2018-01-09 NOTE — Therapy (Signed)
[place], [place], [address] Phone: [phone]   Fax:  [phone]  Physical Therapy Treatment/Discharge Summary  Patient Details  Name: Angela Wang MRN: [id number] Date of Birth: 1931/02/19 Referring Provider: Leandrew Koyanagi, MD   Encounter Date: 01/09/2018  PT End of Session - 01/09/18 1007    Visit Number  9    Number of Visits  13    Date for PT Re-Evaluation  01/24/18    Authorization Type  [employer]    PT Start Time  1007    PT Stop Time  1100    PT Time Calculation (min)  53 min    Activity Tolerance  Patient tolerated treatment well    Behavior During Therapy  [facility] for tasks assessed/performed       Past Medical History:  Diagnosis Date  . ALLERGIC RHINITIS 06/21/2007   Qualifier: Diagnosis of  By: Wynona Luna   . Allergy    allergic rhinitis  . Anemia    nos  . ANEMIA-NOS 06/21/2007   Qualifier: Diagnosis of  By: Wynona Luna   . ANXIETY 06/21/2007   Qualifier: Diagnosis of  By: Wynona Luna   . Arthritis    osteoarthritis  . Atypical chest pain 09/2008   negative myoview  . BACK PAIN, LUMBAR 03/06/2010   Qualifier: Diagnosis of  By: Wynona Luna   . CEREBRAL ANEURYSM 06/21/2007   Qualifier: History of  By: Wynona Luna   . CEREBROVASCULAR ACCIDENT, HX OF 06/21/2007   Qualifier: Diagnosis of  By: Wynona Luna   . CHEST PAIN, ATYPICAL 09/02/2008   Qualifier: Diagnosis of  By: Wynona Luna   . Chronic sinusitis   . CONSTIPATION, CHRONIC 09/20/2009   Qualifier: Diagnosis of  By: Olevia Perches MD, Lowella Bandy   . Depression   . DEPRESSION 06/21/2007   Qualifier: Diagnosis of  By: Wynona Luna   . Diverticulosis   . DIVERTICULOSIS-COLON 06/09/2008   Qualifier: Diagnosis of  By: Chester Holstein NP, Nevin Bloodgood    . Fibromyalgia   . FIBROMYALGIA 06/21/2007   Qualifier: Diagnosis of  By: Wynona Luna   . GASTRITIS, HX OF 08/25/2008   Qualifier: Diagnosis of  By:  Nelson-Smith CMA (AAMA), Dottie    . GERD 05/03/2008   Qualifier: Diagnosis of  By: Julien Girt CMA, Marliss Czar    . GERD (gastroesophageal reflux disease)   . Headache 03/15/2011  . History of solitary pulmonary nodule    right, stable by CT  . Hyperlipidemia   . HYPERLIPIDEMIA 06/21/2007   Qualifier: Diagnosis of  By: Wynona Luna   . Hypertension   . HYPERTENSION 06/21/2007   Qualifier: Diagnosis of  By: Wynona Luna   . HYPOGLYCEMIA 10/24/2010   Qualifier: Diagnosis of  By: Wynona Luna   . HYPOTHYROIDISM 07/22/2008   Qualifier: Diagnosis of  By: Wynona Luna   . IBS 06/21/2007   Qualifier: Diagnosis of  By: Wynona Luna   . IBS (irritable bowel syndrome)   . INSOMNIA, CHRONIC 07/19/2009   Qualifier: Diagnosis of  By: Wynona Luna   . LEG PAIN, RIGHT 02/01/2009   Qualifier: Diagnosis of  By: Wynona Luna   . Leukopenia    chronic  . LEUKOPENIA, CHRONIC 06/21/2007   Qualifier: Diagnosis of  By: Wynona Luna   .  NECK PAIN 02/03/2008   Qualifier: Diagnosis of  By: Wynona Luna   . NUMBNESS 01/29/2008   Qualifier: Diagnosis of  By: Loanne Drilling MD, Jacelyn Pi   . Docia Furl NOS-Unspec 06/21/2007   Centricity Description: DEGENERATIVE JOINT DISEASE, MILD Qualifier: Diagnosis of  By: Wynona Luna  Centricity Description: OSTEOARTHRITIS Qualifier: Diagnosis of  By: Wynona Luna   . PULMONARY NODULE 12/26/2007   Qualifier: Diagnosis of  By: Wynona Luna   . Routine general medical examination at a health care facility   . SCIATICA, RIGHT 05/06/2008   Qualifier: Diagnosis of  By: Nathaneil Canary, CMA, Sarah    . Stroke ([place])   . THROMBOCYTOPENIA 02/09/2009   Qualifier: Diagnosis of  By: Wynona Luna   . UNSTEADY GAIT 07/28/2009   Qualifier: Diagnosis of  By: Wynona Luna   . VENOUS INSUFFICIENCY 05/03/2008   Qualifier: Diagnosis of  By: Julien Girt CMA, Marliss Czar      Past Surgical History:  Procedure Laterality Date  . ABDOMINAL HYSTERECTOMY    . ANEURYSM  COILING    . APPENDECTOMY    . CHOLECYSTECTOMY    . JOINT REPLACEMENT     total right knee replacement    There were no vitals filed for this visit.  Subjective Assessment - 01/09/18 1007    Subjective  Feeling good.          [facility] PT Assessment - 01/09/18 0001      Observation/Other Assessments   Focus on Therapeutic Outcomes (FOTO)   43% limitation      AROM   Right Knee Flexion  102      Strength   Right Hip Flexion  4-/5    Right Knee Extension  5/5                  [facility] Adult PT Treatment/Exercise - 01/09/18 0001      Knee/Hip Exercises: Stretches   Knee: Self-Stretch Limitations  heel slide with green strap 10x10"       Knee/Hip Exercises: Aerobic   Nustep  L6 5 min LE only      Knee/Hip Exercises: Seated   Long Arc Quad  10 reps;Other (comment) 2#    Sit to Sand  10 reps 5 reps on Airex, 5 reps on therapad; attempted from mat heig      Knee/Hip Exercises: Supine   Bridges  15 reps    Straight Leg Raises  10 reps;Right    Straight Leg Raise with External Rotation  10 reps;Right      Knee/Hip Exercises: Sidelying   Hip ABduction  15 reps      Moist Heat Therapy   Number Minutes Moist Heat  10 Minutes    Moist Heat Location  Knee             PT Education - 01/09/18 1520    Education provided  Yes    Education Details  FOTO, goals discussion, exercise form/rationale, importance of HEP, transition to group program    Person(s) Educated  Patient    Methods  Explanation;Demonstration;Tactile cues;Verbal cues    Comprehension  Verbalized understanding;Returned demonstration;Verbal cues required;Tactile cues required       PT Short Term Goals - 01/09/18 1013      PT SHORT TERM GOAL #1   Title  Pt will be independent with HEP as it has been established and able to demonstrate proper form    Baseline  able  Status  Achieved        PT Long Term Goals - 01/09/18 1014      PT LONG TERM GOAL #1   Title  Pt will be able to stand  from a standard height chair without use of upper extremities    Baseline  continues to require light use of upper extremities.    Status  Not Met      PT LONG TERM GOAL #2   Title  Gross LE strength 4+/5 for support to biomechanical chain    Baseline  see flowsheet    Status  Partially Met      PT LONG TERM GOAL #3   Title  Pt will report ability to drive without limitation by Rt leg    Baseline  sometimes mostly by foot, not bothered by knee    Status  Partially Met      PT LONG TERM GOAL #4   Title  FOTO to 52% limitation    Baseline  43% limited    Status  Achieved            Plan - 01/09/18 1520    Clinical Impression Statement  Pt has made significant progress since beginning PT and is ready for transition to independent exercise program for long term care. Pt will participate in group therapy program in preparation for full independence. pt verbalized knowledge that she needs to continue exercises. Was encouraged to contact us with any further questions.     PT Treatment/Interventions  ADLs/Self Care Home Management;Cryotherapy;Electrical Stimulation;Ultrasound;Moist Heat;Gait training;Stair training;Functional mobility training;Therapeutic exercise;Balance training;Therapeutic activities;Patient/family education;Neuromuscular re-education;Manual techniques;Scar mobilization;Passive range of motion;Taping;Dry needling    PT Home Exercise Plan  hooklying clam & ball squeeze, LAQ, roller to quads, Thomas stretch, sit<>stand, seated HSS; SLR, bridge, heel raises; clam, SAQ    Consulted and Agree with Plan of Care  Patient       Patient will benefit from skilled therapeutic intervention in order to improve the following deficits and impairments:  Abnormal gait, Improper body mechanics, Pain, Increased muscle spasms, Decreased scar mobility, Decreased activity tolerance, Decreased range of motion, Decreased strength, Impaired flexibility, Difficulty walking  Visit  Diagnosis: Acute pain of right knee  Pain in right leg  Joint stiffness of right lower leg  Muscle weakness (generalized)     Problem List Patient Active Problem List   Diagnosis Date Noted  . Cerebral embolism with cerebral infarction ([place]) 07/10/2013  . Headache(784.0) 03/15/2011  . HYPOGLYCEMIA 10/24/2010  . BACK PAIN, LUMBAR 03/06/2010  . CONSTIPATION, CHRONIC 09/20/2009  . SINUSITIS, CHRONIC 08/16/2009  . UNSTEADY GAIT 07/28/2009  . INSOMNIA, CHRONIC 07/19/2009  . THROMBOCYTOPENIA 02/09/2009  . LEG PAIN, RIGHT 02/01/2009  . Other abnormal glucose 02/01/2009  . CHEST PAIN, ATYPICAL 09/02/2008  . GASTRITIS, HX OF 08/25/2008  . HYPOTHYROIDISM 07/22/2008  . DIVERTICULOSIS-COLON 06/09/2008  . SCIATICA, RIGHT 05/06/2008  . VENOUS INSUFFICIENCY 05/03/2008  . GERD 05/03/2008  . NECK PAIN 02/03/2008  . NUMBNESS 01/29/2008  . PULMONARY NODULE 12/26/2007  . HYPERLIPIDEMIA 06/21/2007  . ANEMIA-NOS 06/21/2007  . LEUKOPENIA, CHRONIC 06/21/2007  . ANXIETY 06/21/2007  . DEPRESSION 06/21/2007  . HYPERTENSION 06/21/2007  . CEREBRAL ANEURYSM 06/21/2007  . ALLERGIC RHINITIS 06/21/2007  . IBS 06/21/2007  . Osteoarthrosis, unspecified whether generalized or localized, unspecified site 06/21/2007  . FIBROMYALGIA 06/21/2007  . CEREBROVASCULAR ACCIDENT, HX OF 06/21/2007   PHYSICAL THERAPY DISCHARGE SUMMARY  Visits from Start of Care: 9  Current functional level related to goals / functional  outcomes: See above   Remaining deficits: See above   Education / Equipment: Anatomy of condition, POC, HEP, exercise form/rationale  Plan: Patient agrees to discharge.  Patient goals were partially met. Patient is being discharged due to                                                     ?????   Readiness to d/c to long term, independent program.    [facility] C. Liticia Gasior PT, DPT 01/09/18 3:23 PM   [facility] [address] [place], [place], [address] Phone: [phone]   Fax:  [phone]  Name: CHANDREA ZELLMAN MRN: [id number] Date of Birth: [birth year]

## 2018-01-14 ENCOUNTER — Ambulatory Visit: Payer: Medicare Other | Admitting: Physical Therapy

## 2018-01-16 ENCOUNTER — Encounter: Payer: Medicare Other | Admitting: Physical Therapy

## 2018-01-21 ENCOUNTER — Encounter: Payer: Medicare Other | Admitting: Physical Therapy

## 2018-01-21 ENCOUNTER — Ambulatory Visit: Payer: Medicare Other | Admitting: Neurology

## 2018-01-23 ENCOUNTER — Encounter: Payer: Medicare Other | Admitting: Physical Therapy

## 2018-01-30 ENCOUNTER — Ambulatory Visit (INDEPENDENT_AMBULATORY_CARE_PROVIDER_SITE_OTHER): Payer: Medicare Other

## 2018-01-30 ENCOUNTER — Encounter (INDEPENDENT_AMBULATORY_CARE_PROVIDER_SITE_OTHER): Payer: Self-pay | Admitting: Orthopaedic Surgery

## 2018-01-30 ENCOUNTER — Ambulatory Visit (INDEPENDENT_AMBULATORY_CARE_PROVIDER_SITE_OTHER): Payer: Medicare Other | Admitting: Orthopaedic Surgery

## 2018-01-30 DIAGNOSIS — M5432 Sciatica, left side: Secondary | ICD-10-CM

## 2018-01-30 MED ORDER — ACETAMINOPHEN-CODEINE #3 300-30 MG PO TABS: 1.0000 | ORAL_TABLET | Freq: Four times a day (QID) | ORAL | 0 refills | Status: DC | PRN

## 2018-01-30 NOTE — Progress Notes (Signed)
Office Visit Note   Patient: Angela Wang           Date of Birth: February 20, 1931           MRN: [id number] Visit Date: 01/30/2018              Requested by: [place], [place], [place] [address] [place] [place], [place] [address] PCP: Belva Bertin, [place], [place]   Assessment & Plan: Visit Diagnoses:  1. Sciatica of left side     Plan: Impression is lumbar radiculopathy left lower extremity.  Because Angela Wang is unable to tolerate oral anti-inflammatories we are going to go ahead and order an MRI of her lumbar spine.  She will follow-up with [place] once that is completed.  In the meantime, we will give her a prescription for Tylenol 3 which is helped her in the past.  She is to call with any concerns or questions in the meantime.  Follow-Up Instructions: Return if symptoms worsen or fail to improve.   Orders:  Orders Placed This Encounter  Procedures  . XR HIP UNILAT W OR W/O PELVIS 2-3 VIEWS LEFT  . XR Lumbar Spine 2-3 Views   Meds ordered this encounter  Medications  . acetaminophen-codeine (TYLENOL #3) 300-30 MG tablet    Sig: Take 1-2 tablets by mouth every 6 (six) hours as needed for moderate pain.    Dispense:  30 tablet    Refill:  0      Procedures: No procedures performed   Clinical Data: No additional findings.   Subjective: Chief Complaint  Patient presents with  . Left Leg - Pain    HPI Angela Wang is a pleasant 84 year old female presents to our clinic today with left lower extremity pain and decreased sensation.  This began about 1 month ago without any known injury or change in activity.  The pain she has starts in her left buttocks and runs down the left leg affecting the lateral, posterior and anterior thigh.  No groin pain.  She describes this as an intermittent sharp pain for which there is no specific aggravator.  She has not tried any medications to relieve her symptoms.  She does note numbness and burning to the anterolateral aspect of the thigh.  No change  in bowel or bladder function.  No saddle paresthesias.  No previous epidural steroid injection or surgical intervention to the lumbar spine.  No previous hip pathology.  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  Review of Systems as detailed in HPI.  All others reviewed and are negative.   Objective: Vital Signs: There were no vitals taken for this visit.  Physical Exam well-developed well-nourished female no acute distress.  Alert and oriented x3.  Ortho Exam examination of the left lower extremity reveals negative logroll and negative Corky Sox.  Positive straight leg raise.  Full ankle knee and hip strength.  She is neurovascular intact distally.  Specialty Comments:  No specialty comments available.  Imaging: Xr Hip Unilat W Or W/o Pelvis 2-3 Views Left  Result Date: 01/30/2018 X-rays of the pelvis are unremarkable.  Xr Lumbar Spine 2-3 Views  Result Date: 01/30/2018 X-rays of the lumbar spine reveal marked degenerative disc disease L4-5 and L5-S1 with a retrolisthesis at L5.    PMFS History: Patient Active Problem List   Diagnosis Date Noted  . Cerebral embolism with cerebral infarction ([place]) 07/10/2013  . Headache(784.0)  03/15/2011  . HYPOGLYCEMIA 10/24/2010  . BACK PAIN, LUMBAR 03/06/2010  . CONSTIPATION, CHRONIC 09/20/2009  . SINUSITIS, CHRONIC 08/16/2009  . UNSTEADY GAIT 07/28/2009  . INSOMNIA, CHRONIC 07/19/2009  . THROMBOCYTOPENIA 02/09/2009  . LEG PAIN, RIGHT 02/01/2009  . Other abnormal glucose 02/01/2009  . CHEST PAIN, ATYPICAL 09/02/2008  . GASTRITIS, HX OF 08/25/2008  . HYPOTHYROIDISM 07/22/2008  . DIVERTICULOSIS-COLON 06/09/2008  . Sciatica of left side 05/06/2008  . VENOUS INSUFFICIENCY 05/03/2008  . GERD 05/03/2008  . NECK PAIN 02/03/2008  . NUMBNESS 01/29/2008  . PULMONARY NODULE 12/26/2007  . HYPERLIPIDEMIA 06/21/2007  . ANEMIA-NOS 06/21/2007  . LEUKOPENIA, CHRONIC  06/21/2007  . ANXIETY 06/21/2007  . DEPRESSION 06/21/2007  . HYPERTENSION 06/21/2007  . CEREBRAL ANEURYSM 06/21/2007  . ALLERGIC RHINITIS 06/21/2007  . IBS 06/21/2007  . Osteoarthrosis, unspecified whether generalized or localized, unspecified site 06/21/2007  . FIBROMYALGIA 06/21/2007  . CEREBROVASCULAR ACCIDENT, HX OF 06/21/2007   Past Medical History:  Diagnosis Date  . ALLERGIC RHINITIS 06/21/2007   Qualifier: Diagnosis of  By: Wynona Luna   . Allergy    allergic rhinitis  . Anemia    nos  . ANEMIA-NOS 06/21/2007   Qualifier: Diagnosis of  By: Wynona Luna   . ANXIETY 06/21/2007   Qualifier: Diagnosis of  By: Wynona Luna   . Arthritis    osteoarthritis  . Atypical chest pain 09/2008   negative myoview  . BACK PAIN, LUMBAR 03/06/2010   Qualifier: Diagnosis of  By: Wynona Luna   . CEREBRAL ANEURYSM 06/21/2007   Qualifier: History of  By: Wynona Luna   . CEREBROVASCULAR ACCIDENT, HX OF 06/21/2007   Qualifier: Diagnosis of  By: Wynona Luna   . CHEST PAIN, ATYPICAL 09/02/2008   Qualifier: Diagnosis of  By: Wynona Luna   . Chronic sinusitis   . CONSTIPATION, CHRONIC 09/20/2009   Qualifier: Diagnosis of  By: Olevia Perches MD, Lowella Bandy   . Depression   . DEPRESSION 06/21/2007   Qualifier: Diagnosis of  By: Wynona Luna   . Diverticulosis   . DIVERTICULOSIS-COLON 06/09/2008   Qualifier: Diagnosis of  By: Chester Holstein NP, Nevin Bloodgood    . Fibromyalgia   . FIBROMYALGIA 06/21/2007   Qualifier: Diagnosis of  By: Wynona Luna   . GASTRITIS, HX OF 08/25/2008   Qualifier: Diagnosis of  By: Nelson-Smith CMA (AAMA), Dottie    . GERD 05/03/2008   Qualifier: Diagnosis of  By: Julien Girt CMA, Marliss Czar    . GERD (gastroesophageal reflux disease)   . Headache 03/15/2011  . History of solitary pulmonary nodule    right, stable by CT  . Hyperlipidemia   . HYPERLIPIDEMIA 06/21/2007   Qualifier: Diagnosis of  By: Wynona Luna   . Hypertension   . HYPERTENSION  06/21/2007   Qualifier: Diagnosis of  By: Wynona Luna   . HYPOGLYCEMIA 10/24/2010   Qualifier: Diagnosis of  By: Wynona Luna   . HYPOTHYROIDISM 07/22/2008   Qualifier: Diagnosis of  By: Wynona Luna   . IBS 06/21/2007   Qualifier: Diagnosis of  By: Wynona Luna   . IBS (irritable bowel syndrome)   . INSOMNIA, CHRONIC 07/19/2009   Qualifier: Diagnosis of  By: Wynona Luna   . LEG PAIN, RIGHT 02/01/2009   Qualifier: Diagnosis of  By: Wynona Luna   . Leukopenia    chronic  .  LEUKOPENIA, CHRONIC 06/21/2007   Qualifier: Diagnosis of  By: Wynona Luna   . NECK PAIN 02/03/2008   Qualifier: Diagnosis of  By: Wynona Luna   . NUMBNESS 01/29/2008   Qualifier: Diagnosis of  By: Loanne Drilling MD, Jacelyn Pi   . Docia Furl NOS-Unspec 06/21/2007   Centricity Description: DEGENERATIVE JOINT DISEASE, MILD Qualifier: Diagnosis of  By: Wynona Luna  Centricity Description: OSTEOARTHRITIS Qualifier: Diagnosis of  By: Wynona Luna   . PULMONARY NODULE 12/26/2007   Qualifier: Diagnosis of  By: Wynona Luna   . Routine general medical examination at a health care facility   . SCIATICA, RIGHT 05/06/2008   Qualifier: Diagnosis of  By: Nathaneil Canary, CMA, Sarah    . Stroke ([place])   . THROMBOCYTOPENIA 02/09/2009   Qualifier: Diagnosis of  By: Wynona Luna   . UNSTEADY GAIT 07/28/2009   Qualifier: Diagnosis of  By: Wynona Luna   . VENOUS INSUFFICIENCY 05/03/2008   Qualifier: Diagnosis of  By: Julien Girt CMA, Leigh      Family History  Problem Relation Age of Onset  . Cancer Mother        pancreatic  . Coronary artery disease Father   . Cancer Sister         breast  . Asthma Sister   . Breast cancer Sister   . Cancer Brother        prostate  . Allergies Brother   . Cancer Maternal Aunt        colon  . Diabetes Other   . Cancer Other        ovarian    Past Surgical History:  Procedure Laterality Date  . ABDOMINAL HYSTERECTOMY    . ANEURYSM COILING    .  APPENDECTOMY    . CHOLECYSTECTOMY    . JOINT REPLACEMENT     total right knee replacement   Social History   Occupational History  . 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 Smoking status: Never Smoker  . Smokeless tobacco: Never Used  Substance and Sexual Activity  . Alcohol use: No  . Drug use: No  . Sexual activity: Not on file

## 2018-02-06 ENCOUNTER — Ambulatory Visit: Payer: Medicare Other

## 2018-02-24 ENCOUNTER — Ambulatory Visit
Admission: RE | Admit: 2018-02-24 | Discharge: 2018-02-24 | Disposition: A | Discharge status: Home / Self Care | Payer: Medicare Other | Source: Ambulatory Visit | Attending: Obstetrics and Gynecology | Admitting: Obstetrics and Gynecology

## 2018-02-24 DIAGNOSIS — Z1231 Encounter for screening mammogram for malignant neoplasm of breast: Secondary | ICD-10-CM

## 2018-03-17 ENCOUNTER — Encounter (HOSPITAL_COMMUNITY): Payer: Self-pay | Admitting: Emergency Medicine

## 2018-03-17 ENCOUNTER — Ambulatory Visit (HOSPITAL_COMMUNITY)
Admission: EM | Admit: 2018-03-17 | Discharge: 2018-03-17 | Disposition: A | Discharge status: Home / Self Care | Payer: Medicare Other | Attending: Family Medicine | Admitting: Family Medicine

## 2018-03-17 ENCOUNTER — Other Ambulatory Visit: Payer: Self-pay

## 2018-03-17 DIAGNOSIS — J0110 Acute frontal sinusitis, unspecified: Secondary | ICD-10-CM | POA: Diagnosis not present

## 2018-03-17 MED ORDER — CETIRIZINE HCL 5 MG PO TABS: 5.0000 mg | ORAL_TABLET | Freq: Every day | ORAL | 0 refills | Status: DC

## 2018-03-17 MED ORDER — DOXYCYCLINE HYCLATE 100 MG PO CAPS: 100.0000 mg | ORAL_CAPSULE | Freq: Two times a day (BID) | ORAL | 0 refills | Status: DC

## 2018-03-17 NOTE — ED Triage Notes (Signed)
Patient reports 3-4 days ago started having a runny nose, sore throat, then right ear pain and headache. Patient says home health nurse reports wax in right ear.

## 2018-03-17 NOTE — Discharge Instructions (Signed)
Push fluids to ensure adequate hydration and keep secretions thin.  Tylenol and/or ibuprofen as needed for pain or fevers.  Complete course of antibiotics.   Zyrtec daily. Continue with daily eye drops and nasal spray. If symptoms worsen or do not improve in the next week to return to be seen or to follow up with your PCP.

## 2018-03-17 NOTE — ED Provider Notes (Signed)
[place]    CSN: [id number] Arrival date & time: 03/17/18  1019     History   Chief Complaint Chief Complaint  Patient presents with  . Otalgia    HPI Angela Wang is a 84 y.o. female.   Angela Wang presents with complaints of runny nose, facial pressure, sore throat, headache, chills which started approximately 2-3 days ago and has been worsening. Cough. History of allergies, uses daily flonase and eye drops. No known ill contacts. Has been taking mucinex which has not helped with symptoms. Without nausea or vomiting. Hx allergic rhinitis, CVA, OA, htn, ibs, thrombocytopenia, back pain- see history.    ROS per HPI.      Past Medical History:  Diagnosis Date  . ALLERGIC RHINITIS 06/21/2007   Qualifier: Diagnosis of  By: Wynona Luna   . Allergy    allergic rhinitis  . Anemia    nos  . ANEMIA-NOS 06/21/2007   Qualifier: Diagnosis of  By: Wynona Luna   . ANXIETY 06/21/2007   Qualifier: Diagnosis of  By: Wynona Luna   . Arthritis    osteoarthritis  . Atypical chest pain 09/2008   negative myoview  . BACK PAIN, LUMBAR 03/06/2010   Qualifier: Diagnosis of  By: Wynona Luna   . CEREBRAL ANEURYSM 06/21/2007   Qualifier: History of  By: Wynona Luna   . CEREBROVASCULAR ACCIDENT, HX OF 06/21/2007   Qualifier: Diagnosis of  By: Wynona Luna   . CHEST PAIN, ATYPICAL 09/02/2008   Qualifier: Diagnosis of  By: Wynona Luna   . Chronic sinusitis   . CONSTIPATION, CHRONIC 09/20/2009   Qualifier: Diagnosis of  By: Olevia Perches MD, Lowella Bandy   . Depression   . DEPRESSION 06/21/2007   Qualifier: Diagnosis of  By: Wynona Luna   . Diverticulosis   . DIVERTICULOSIS-COLON 06/09/2008   Qualifier: Diagnosis of  By: Chester Holstein NP, Nevin Bloodgood    . Fibromyalgia   . FIBROMYALGIA 06/21/2007   Qualifier: Diagnosis of  By: Wynona Luna   . GASTRITIS, HX OF 08/25/2008   Qualifier: Diagnosis of  By: Nelson-Smith CMA (AAMA), Dottie    . GERD 05/03/2008   Qualifier: Diagnosis of  By: Julien Girt CMA, Marliss Czar    . GERD (gastroesophageal reflux disease)   . Headache 03/15/2011  . History of solitary pulmonary nodule    right, stable by CT  . Hyperlipidemia   . HYPERLIPIDEMIA 06/21/2007   Qualifier: Diagnosis of  By: Wynona Luna   . Hypertension   . HYPERTENSION 06/21/2007   Qualifier: Diagnosis of  By: Wynona Luna   . HYPOGLYCEMIA 10/24/2010   Qualifier: Diagnosis of  By: Wynona Luna   . HYPOTHYROIDISM 07/22/2008   Qualifier: Diagnosis of  By: Wynona Luna   . IBS 06/21/2007   Qualifier: Diagnosis of  By: Wynona Luna   . IBS (irritable bowel syndrome)   . INSOMNIA, CHRONIC 07/19/2009   Qualifier: Diagnosis of  By: Wynona Luna   . LEG PAIN, RIGHT 02/01/2009   Qualifier: Diagnosis of  By: Wynona Luna   . Leukopenia    chronic  . LEUKOPENIA, CHRONIC 06/21/2007   Qualifier: Diagnosis of  By: Wynona Luna   . NECK PAIN 02/03/2008   Qualifier: Diagnosis of  By: Wynona Luna   . NUMBNESS 01/29/2008   Qualifier:  Diagnosis of  By: Loanne Drilling MD, Jacelyn Pi Docia Furl NOS-Unspec 06/21/2007   Centricity Description: DEGENERATIVE JOINT DISEASE, MILD Qualifier: Diagnosis of  By: Wynona Luna  Centricity Description: OSTEOARTHRITIS Qualifier: Diagnosis of  By: Wynona Luna   . PULMONARY NODULE 12/26/2007   Qualifier: Diagnosis of  By: Wynona Luna   . Routine general medical examination at a health care facility   . SCIATICA, RIGHT 05/06/2008   Qualifier: Diagnosis of  By: Nathaneil Canary, CMA, Sarah    . Stroke ([place])   . THROMBOCYTOPENIA 02/09/2009   Qualifier: Diagnosis of  By: Wynona Luna   . UNSTEADY GAIT 07/28/2009   Qualifier: Diagnosis of  By: Wynona Luna   . VENOUS INSUFFICIENCY 05/03/2008   Qualifier: Diagnosis of  By: Julien Girt CMA, Leigh      Patient Active Problem List   Diagnosis Date Noted  . Cerebral embolism with cerebral infarction ([place]) 07/10/2013  . Headache(784.0) 03/15/2011  .  HYPOGLYCEMIA 10/24/2010  . BACK PAIN, LUMBAR 03/06/2010  . CONSTIPATION, CHRONIC 09/20/2009  . SINUSITIS, CHRONIC 08/16/2009  . UNSTEADY GAIT 07/28/2009  . INSOMNIA, CHRONIC 07/19/2009  . THROMBOCYTOPENIA 02/09/2009  . LEG PAIN, RIGHT 02/01/2009  . Other abnormal glucose 02/01/2009  . CHEST PAIN, ATYPICAL 09/02/2008  . GASTRITIS, HX OF 08/25/2008  . HYPOTHYROIDISM 07/22/2008  . DIVERTICULOSIS-COLON 06/09/2008  . Sciatica of left side 05/06/2008  . VENOUS INSUFFICIENCY 05/03/2008  . GERD 05/03/2008  . NECK PAIN 02/03/2008  . NUMBNESS 01/29/2008  . PULMONARY NODULE 12/26/2007  . HYPERLIPIDEMIA 06/21/2007  . ANEMIA-NOS 06/21/2007  . LEUKOPENIA, CHRONIC 06/21/2007  . ANXIETY 06/21/2007  . DEPRESSION 06/21/2007  . HYPERTENSION 06/21/2007  . CEREBRAL ANEURYSM 06/21/2007  . ALLERGIC RHINITIS 06/21/2007  . IBS 06/21/2007  . Osteoarthrosis, unspecified whether generalized or localized, unspecified site 06/21/2007  . FIBROMYALGIA 06/21/2007  . CEREBROVASCULAR ACCIDENT, HX OF 06/21/2007    Past Surgical History:  Procedure Laterality Date  . ABDOMINAL HYSTERECTOMY    . ANEURYSM COILING    . APPENDECTOMY    . CHOLECYSTECTOMY    . JOINT REPLACEMENT     total right knee replacement    OB History   None      Home Medications    Prior to Admission medications   Medication Sig Start Date End Date Taking? Authorizing Provider  acetaminophen-codeine (TYLENOL #3) 300-30 MG tablet Take 1-2 tablets by mouth every 8 (eight) hours as needed for moderate pain. 10/28/17   Leandrew Koyanagi, MD  acetaminophen-codeine (TYLENOL #3) 300-30 MG tablet Take 1-2 tablets by mouth every 6 (six) hours as needed for moderate pain. 01/30/18   Aundra Dubin, PA-C  ALPRAZolam Duanne Moron) 0.5 MG tablet Take 0.5 mg by mouth 3 (three) times daily as needed for sleep or anxiety.    [provider]  B Complex Vitamins (B COMPLEX 100 PO) Take 1 tablet by mouth daily.      [provider]  Calcium  Carbonate-Vitamin D (CALCIUM-VITAMIN D) 500-200 MG-UNIT per tablet Take 1 tablet by mouth 3 (three) times daily.      [provider]  cetirizine (ZYRTEC) 5 MG tablet Take 1 tablet (5 mg total) by mouth daily. 03/17/18   Zigmund Gottron, NP  clopidogrel (PLAVIX) 75 MG tablet Take 1 tablet (75 mg total) by mouth daily. Patient not taking: Reported on 12/11/2017 12/20/16   Garvin Fila, MD  co-enzyme Q-10 30 MG capsule Take 30 mg by mouth  3 (three) times daily.      [provider]  doxycycline (VIBRAMYCIN) 100 MG capsule Take 1 capsule (100 mg total) by mouth 2 (two) times daily. 03/17/18   Zigmund Gottron, NP  fluticasone (FLONASE) 50 MCG/ACT nasal spray Place 2 sprays into both nostrils daily.    [provider]  Linaclotide Rolan Lipa) 145 MCG CAPS capsule Take 145 mcg by mouth daily.    [provider]  Magnesium 500 MG TABS Take 1 tablet by mouth daily.      [provider]  methocarbamol (ROBAXIN) 500 MG tablet as needed. TAKES 1/2 TABLET AS NEEDED 04/21/14   [provider]  Multiple Vitamin (MULTI-VITAMINS) TABS Take by mouth.    [provider]  Olopatadine HCl (PATADAY) 0.2 % SOLN Apply 1 drop to eye daily.    [provider]  triamcinolone cream (KENALOG) 0.1 % MIX WITH CETAPHIL MOISTURIZING CREAM. APPLY THREE TIMES DAILY AS NEEDED 10/30/16   [provider]  vitamin C (ASCORBIC ACID) 500 MG tablet Take 500 mg by mouth.    [provider]    Family History Family History  Problem Relation Age of Onset  . Cancer Mother        pancreatic  . Coronary artery disease Father   . Cancer Sister         breast  . Asthma Sister   . Breast cancer Sister   . Cancer Brother        prostate  . Allergies Brother   . Cancer Maternal Aunt        colon  . Diabetes Other   . Cancer Other        ovarian    Social History Social History   Tobacco Use  . Smoking status: Never Smoker  . Smokeless  tobacco: Never Used  Substance Use Topics  . Alcohol use: No  . Drug use: No     Allergies   Amitriptyline hcl; Aspirin; Citalopram; Escitalopram oxalate; Escitalopram oxalate; Gabapentin; Hydrocodone-acetaminophen; Lorazepam; Oxycodone; Oxycodone-acetaminophen; Terfenadine; Tramadol; Codeine; and Latex   Review of Systems Review of Systems   Physical Exam Triage Vital Signs ED Triage Vitals  Enc Vitals Group     BP 03/17/18 1125 (!) 143/72     Pulse Rate 03/17/18 1125 85     Resp 03/17/18 1125 18     Temp 03/17/18 1125 98.3 F (36.8 C)     Temp Source 03/17/18 1125 Oral     SpO2 03/17/18 1125 100 %     Weight --      Height --      Head Circumference --      Peak Flow --      Pain Score 03/17/18 1123 10     Pain Loc --      Pain Edu? --      Excl. in [place]? --    No data found.  Updated Vital Signs BP (!) 143/72 (BP Location: Left Arm)   Pulse 85   Temp 98.3 F (36.8 C) (Oral)   Resp 18   SpO2 100%   Visual Acuity Right Eye Distance:   Left Eye Distance:   Bilateral Distance:    Right Eye Near:   Left Eye Near:    Bilateral Near:     Physical Exam  Constitutional: She is oriented to person, place, and time. She appears well-developed and well-nourished. No distress.  HENT:  Head: Normocephalic and atraumatic.  Right Ear: Tympanic membrane, external  ear and ear canal normal.  Left Ear: Tympanic membrane, external ear and ear canal normal.  Nose: Rhinorrhea present. Right sinus exhibits frontal sinus tenderness. Left sinus exhibits frontal sinus tenderness.  Mouth/Throat: Uvula is midline, oropharynx is clear and moist and mucous membranes are normal. No tonsillar exudate.  Bilateral cerumen- non occluded however  Eyes: Pupils are equal, round, and reactive to light. Conjunctivae and EOM are normal.  Cardiovascular: Normal rate, regular rhythm and normal heart sounds.  Pulmonary/Chest: Effort normal and breath sounds normal.  Occasional strong cough  noted; lungs clear  Neurological: She is alert and oriented to person, place, and time.  Skin: Skin is warm and dry.     UC Treatments / Results  Labs (all labs ordered are listed, but only abnormal results are displayed) Labs Reviewed - No data to display  EKG None Radiology No results found.  Procedures Procedures (including critical care time)  Medications Ordered in UC Medications - No data to display   Initial Impression / Assessment and Plan / UC Course  I have reviewed the triage vital signs and the nursing notes.  Pertinent labs & imaging results that were available during my care of the patient were reviewed by me and considered in my medical decision making (see chart for details).     Afebrile, worsening of symptoms only for the past 3 days but patient reporting quite severe of symptoms. Opted to initiate antibiotic for sinusitis at this time, patient states she is unable to tolerate gi side effects of amoxicillin, doxy initiated. Daily zyrtec recommended as well as allergies may be contributing. Patient requesting a cough syrup. Occasional cough noted at this time and little benefit to outweigh risk of cough syrup determined in this 84 yo patient. Return precautions provided. Patient verbalized understanding and agreeable to plan.    Final Clinical Impressions(s) / UC Diagnoses   Final diagnoses:  Acute frontal sinusitis, recurrence not specified    ED Discharge Orders        Ordered    doxycycline (VIBRAMYCIN) 100 MG capsule  2 times daily     03/17/18 1143    cetirizine (ZYRTEC) 5 MG tablet  Daily     03/17/18 1143       Controlled Substance Prescriptions [place] Controlled Substance Registry consulted? Not Applicable   Zigmund Gottron, NP 03/17/18 1149

## 2018-05-09 ENCOUNTER — Telehealth (INDEPENDENT_AMBULATORY_CARE_PROVIDER_SITE_OTHER): Payer: Self-pay | Admitting: Orthopaedic Surgery

## 2018-05-09 NOTE — Telephone Encounter (Signed)
One time #30

## 2018-05-09 NOTE — Telephone Encounter (Signed)
Patient called needing Rx refilled (Tylenol #3) Patient uses [facility]. The number to contact patient is [phone]

## 2018-05-09 NOTE — Telephone Encounter (Signed)
Please advise 

## 2018-05-14 ENCOUNTER — Other Ambulatory Visit (INDEPENDENT_AMBULATORY_CARE_PROVIDER_SITE_OTHER): Payer: Self-pay

## 2018-05-14 MED ORDER — ACETAMINOPHEN-CODEINE #3 300-30 MG PO TABS: 1.0000 | ORAL_TABLET | Freq: Two times a day (BID) | ORAL | 0 refills | Status: DC | PRN

## 2018-05-14 NOTE — Telephone Encounter (Signed)
Called into pharm  

## 2018-05-14 NOTE — Telephone Encounter (Signed)
Patient aware.

## 2018-05-22 ENCOUNTER — Ambulatory Visit (INDEPENDENT_AMBULATORY_CARE_PROVIDER_SITE_OTHER): Payer: Self-pay

## 2018-05-22 ENCOUNTER — Ambulatory Visit (HOSPITAL_BASED_OUTPATIENT_CLINIC_OR_DEPARTMENT_OTHER)
Admission: RE | Admit: 2018-05-22 | Discharge: 2018-05-22 | Disposition: A | Discharge status: Home / Self Care | Payer: Medicare Other | Source: Ambulatory Visit | Attending: Orthopaedic Surgery | Admitting: Orthopaedic Surgery

## 2018-05-22 ENCOUNTER — Ambulatory Visit (INDEPENDENT_AMBULATORY_CARE_PROVIDER_SITE_OTHER): Payer: Medicare Other | Admitting: Orthopaedic Surgery

## 2018-05-22 DIAGNOSIS — M79662 Pain in left lower leg: Secondary | ICD-10-CM

## 2018-05-22 DIAGNOSIS — M79661 Pain in right lower leg: Secondary | ICD-10-CM | POA: Diagnosis not present

## 2018-05-22 DIAGNOSIS — M79671 Pain in right foot: Secondary | ICD-10-CM

## 2018-05-22 DIAGNOSIS — M25561 Pain in right knee: Secondary | ICD-10-CM | POA: Diagnosis not present

## 2018-05-22 DIAGNOSIS — M7989 Other specified soft tissue disorders: Secondary | ICD-10-CM | POA: Diagnosis present

## 2018-05-22 DIAGNOSIS — M79672 Pain in left foot: Secondary | ICD-10-CM | POA: Diagnosis not present

## 2018-05-22 DIAGNOSIS — M25562 Pain in left knee: Secondary | ICD-10-CM | POA: Diagnosis not present

## 2018-05-22 DIAGNOSIS — G8929 Other chronic pain: Secondary | ICD-10-CM

## 2018-05-22 NOTE — Progress Notes (Signed)
Office Visit Note   Patient: Angela Wang           Date of Birth: 1931/02/24           MRN: [id number] Visit Date: 05/22/2018              Requested by: [place], [place], [place] [address] [place], [place] [address] PCP: Belva Bertin, [place], [place]   Assessment & Plan: Visit Diagnoses:  1. Pain in right foot   2. Pain in left foot   3. Chronic pain of right knee   4. Chronic pain of left knee   5. Pain and swelling of right lower leg   6. Pain and swelling of lower leg, left     Plan: Impression is bilateral lower extremity swelling.  X-rays were negative.  I think this is more of a medical issue that could be related to poor circulation or congestive heart failure or DVT.  We will order ultrasound to rule this out.  Referral to podiatry for bilateral foot pain.  We will contact the patient with the results of the  Doppler studies.  Follow-Up Instructions: Return if symptoms worsen or fail to improve.   Orders:  Orders Placed This Encounter  Procedures  . XR Foot Complete Right  . XR Foot Complete Left  . XR KNEE 3 VIEW RIGHT  . XR KNEE 3 VIEW LEFT  . US Venous Img Lower Bilateral  . Ambulatory referral to Podiatry   No orders of the defined types were placed in this encounter.     Procedures: No procedures performed   Clinical Data: No additional findings.   Subjective: Chief Complaint  Patient presents with  . Right Foot - Pain  . Left Foot - Pain  . Left Knee - Pain, Edema  . Right Knee - Pain, Edema    Patient is a 84 year old female who comes in with back pain and bilateral knee pain and bilateral leg pain and ankle pain.  She mainly feels like her legs are stiff and tight.  She recently fell on her buttock.  She is able to ambulate without much difficulty.  Denies any numbness and tingling.   Review of Systems  Constitutional: Negative.   HENT: Negative.   Eyes: Negative.   Respiratory: Negative.   Cardiovascular: Negative.     Endocrine: Negative.   Musculoskeletal: Negative.   Neurological: Negative.   Hematological: Negative.   Psychiatric/Behavioral: Negative.   All other systems reviewed and are negative.    Objective: Vital Signs: There were no vitals taken for this visit.  Physical Exam  Constitutional: She is oriented to person, place, and time. She appears well-developed and well-nourished.  Pulmonary/Chest: Effort normal.  Neurological: She is alert and oriented to person, place, and time.  Skin: Skin is warm. Capillary refill takes less than 2 seconds.  Psychiatric: She has a normal mood and affect. Her behavior is normal. Judgment and thought content normal.  Nursing note and vitals reviewed.   Ortho Exam Bilateral lower extremity exam is mainly remarkable for severe pitting edema and swelling symmetrically.  There are good distal pulses.  Feet are warm and well perfused. Specialty Comments:  No specialty comments available.  Imaging: US Venous Img Lower Bilateral  Result Date: 05/22/2018 CLINICAL DATA:  84 year old female with bilateral lower extremity edema EXAM: BILATERAL LOWER EXTREMITY VENOUS DOPPLER ULTRASOUND TECHNIQUE: Gray-scale sonography with graded compression, as well as color Doppler and duplex ultrasound were performed to  evaluate the lower extremity deep venous systems from the level of the common femoral vein and including the common femoral, femoral, profunda femoral, popliteal and calf veins including the posterior tibial, peroneal and gastrocnemius veins when visible. The superficial great saphenous vein was also interrogated. Spectral Doppler was utilized to evaluate flow at rest and with distal augmentation maneuvers in the common femoral, femoral and popliteal veins. COMPARISON:  None. FINDINGS: RIGHT LOWER EXTREMITY Common Femoral Vein: No evidence of thrombus. Normal compressibility, respiratory phasicity and response to augmentation. Saphenofemoral Junction: No evidence  of thrombus. Normal compressibility and flow on color Doppler imaging. Profunda Femoral Vein: No evidence of thrombus. Normal compressibility and flow on color Doppler imaging. Femoral Vein: No evidence of thrombus. Normal compressibility, respiratory phasicity and response to augmentation. Popliteal Vein: No evidence of thrombus. Normal compressibility, respiratory phasicity and response to augmentation. Calf Veins: No evidence of thrombus. Normal compressibility and flow on color Doppler imaging. Superficial Great Saphenous Vein: No evidence of thrombus. Normal compressibility. Venous Reflux:  None. Other Findings:  None. LEFT LOWER EXTREMITY Common Femoral Vein: No evidence of thrombus. Normal compressibility, respiratory phasicity and response to augmentation. Saphenofemoral Junction: No evidence of thrombus. Normal compressibility and flow on color Doppler imaging. Profunda Femoral Vein: No evidence of thrombus. Normal compressibility and flow on color Doppler imaging. Femoral Vein: No evidence of thrombus. Normal compressibility, respiratory phasicity and response to augmentation. Popliteal Vein: No evidence of thrombus. Normal compressibility, respiratory phasicity and response to augmentation. Calf Veins: No evidence of thrombus. Normal compressibility and flow on color Doppler imaging. Superficial Great Saphenous Vein: No evidence of thrombus. Normal compressibility. Venous Reflux:  None. Other Findings:  None. IMPRESSION: No evidence of deep venous thrombosis. Electronically Signed   By: Jacqulynn Cadet M.D.   On: 05/22/2018 17:38   Xr Foot Complete Left  Result Date: 05/22/2018 No acute or structural abnormalities.  Xr Foot Complete Right  Result Date: 05/22/2018 No acute or structural abnormalities.  Xr Knee 3 View Left  Result Date: 05/22/2018 No acute or structural abnormalities  Xr Knee 3 View Right  Result Date: 05/22/2018 No acute or structural abnormalities    PMFS  History: Patient Active Problem List   Diagnosis Date Noted  . Cerebral embolism with cerebral infarction ([place]) 07/10/2013  . Headache(784.0) 03/15/2011  . HYPOGLYCEMIA 10/24/2010  . BACK PAIN, LUMBAR 03/06/2010  . CONSTIPATION, CHRONIC 09/20/2009  . SINUSITIS, CHRONIC 08/16/2009  . UNSTEADY GAIT 07/28/2009  . INSOMNIA, CHRONIC 07/19/2009  . THROMBOCYTOPENIA 02/09/2009  . LEG PAIN, RIGHT 02/01/2009  . Other abnormal glucose 02/01/2009  . CHEST PAIN, ATYPICAL 09/02/2008  . GASTRITIS, HX OF 08/25/2008  . HYPOTHYROIDISM 07/22/2008  . DIVERTICULOSIS-COLON 06/09/2008  . Sciatica of left side 05/06/2008  . VENOUS INSUFFICIENCY 05/03/2008  . GERD 05/03/2008  . NECK PAIN 02/03/2008  . NUMBNESS 01/29/2008  . PULMONARY NODULE 12/26/2007  . HYPERLIPIDEMIA 06/21/2007  . ANEMIA-NOS 06/21/2007  . LEUKOPENIA, CHRONIC 06/21/2007  . ANXIETY 06/21/2007  . DEPRESSION 06/21/2007  . HYPERTENSION 06/21/2007  . CEREBRAL ANEURYSM 06/21/2007  . ALLERGIC RHINITIS 06/21/2007  . IBS 06/21/2007  . Osteoarthrosis, unspecified whether generalized or localized, unspecified site 06/21/2007  . FIBROMYALGIA 06/21/2007  . CEREBROVASCULAR ACCIDENT, HX OF 06/21/2007   Past Medical History:  Diagnosis Date  . ALLERGIC RHINITIS 06/21/2007   Qualifier: Diagnosis of  By: Wynona Luna   . Allergy    allergic rhinitis  . Anemia    nos  . ANEMIA-NOS 06/21/2007   Qualifier: Diagnosis of  By: Wynona Luna   . ANXIETY 06/21/2007   Qualifier: Diagnosis of  By: Wynona Luna   . Arthritis    osteoarthritis  . Atypical chest pain 09/2008   negative myoview  . BACK PAIN, LUMBAR 03/06/2010   Qualifier: Diagnosis of  By: Wynona Luna   . CEREBRAL ANEURYSM 06/21/2007   Qualifier: History of  By: Wynona Luna   . CEREBROVASCULAR ACCIDENT, HX OF 06/21/2007   Qualifier: Diagnosis of  By: Wynona Luna   . CHEST PAIN, ATYPICAL 09/02/2008   Qualifier: Diagnosis of  By: Wynona Luna   .  Chronic sinusitis   . CONSTIPATION, CHRONIC 09/20/2009   Qualifier: Diagnosis of  By: Olevia Perches MD, Lowella Bandy   . Depression   . DEPRESSION 06/21/2007   Qualifier: Diagnosis of  By: Wynona Luna   . Diverticulosis   . DIVERTICULOSIS-COLON 06/09/2008   Qualifier: Diagnosis of  By: Chester Holstein NP, Nevin Bloodgood    . Fibromyalgia   . FIBROMYALGIA 06/21/2007   Qualifier: Diagnosis of  By: Wynona Luna   . GASTRITIS, HX OF 08/25/2008   Qualifier: Diagnosis of  By: Nelson-Smith CMA (AAMA), Dottie    . GERD 05/03/2008   Qualifier: Diagnosis of  By: Julien Girt CMA, Marliss Czar    . GERD (gastroesophageal reflux disease)   . Headache 03/15/2011  . History of solitary pulmonary nodule    right, stable by CT  . Hyperlipidemia   . HYPERLIPIDEMIA 06/21/2007   Qualifier: Diagnosis of  By: Wynona Luna   . Hypertension   . HYPERTENSION 06/21/2007   Qualifier: Diagnosis of  By: Wynona Luna   . HYPOGLYCEMIA 10/24/2010   Qualifier: Diagnosis of  By: Wynona Luna   . HYPOTHYROIDISM 07/22/2008   Qualifier: Diagnosis of  By: Wynona Luna   . IBS 06/21/2007   Qualifier: Diagnosis of  By: Wynona Luna   . IBS (irritable bowel syndrome)   . INSOMNIA, CHRONIC 07/19/2009   Qualifier: Diagnosis of  By: Wynona Luna   . LEG PAIN, RIGHT 02/01/2009   Qualifier: Diagnosis of  By: Wynona Luna   . Leukopenia    chronic  . LEUKOPENIA, CHRONIC 06/21/2007   Qualifier: Diagnosis of  By: Wynona Luna   . NECK PAIN 02/03/2008   Qualifier: Diagnosis of  By: Wynona Luna   . NUMBNESS 01/29/2008   Qualifier: Diagnosis of  By: Loanne Drilling MD, Jacelyn Pi   . Docia Furl NOS-Unspec 06/21/2007   Centricity Description: DEGENERATIVE JOINT DISEASE, MILD Qualifier: Diagnosis of  By: Wynona Luna  Centricity Description: OSTEOARTHRITIS Qualifier: Diagnosis of  By: Wynona Luna   . PULMONARY NODULE 12/26/2007   Qualifier: Diagnosis of  By: Wynona Luna   . Routine general medical examination at a health  care facility   . SCIATICA, RIGHT 05/06/2008   Qualifier: Diagnosis of  By: Nathaneil Canary, CMA, Sarah    . Stroke ([place])   . THROMBOCYTOPENIA 02/09/2009   Qualifier: Diagnosis of  By: Wynona Luna   . UNSTEADY GAIT 07/28/2009   Qualifier: Diagnosis of  By: Wynona Luna   . VENOUS INSUFFICIENCY 05/03/2008   Qualifier: Diagnosis of  By: Julien Girt CMA, Leigh      Family History  Problem Relation Age of Onset  . Cancer Mother  pancreatic  . Coronary artery disease Father   . Cancer Sister         breast  . Asthma Sister   . Breast cancer Sister   . Cancer Brother        prostate  . Allergies Brother   . Cancer Maternal Aunt        colon  . Diabetes Other   . Cancer Other        ovarian    Past Surgical History:  Procedure Laterality Date  . ABDOMINAL HYSTERECTOMY    . ANEURYSM COILING    . APPENDECTOMY    . CHOLECYSTECTOMY    . JOINT REPLACEMENT     total right knee replacement   Social History   Occupational History  . 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 Smoking status: Never Smoker  . Smokeless tobacco: Never Used  Substance and Sexual Activity  . Alcohol use: No  . Drug use: No  . Sexual activity: Not on file

## 2018-06-05 ENCOUNTER — Ambulatory Visit: Payer: Medicare Other

## 2018-06-05 ENCOUNTER — Ambulatory Visit: Payer: Medicare Other | Admitting: Podiatry

## 2018-06-05 ENCOUNTER — Encounter: Payer: Self-pay | Admitting: Podiatry

## 2018-06-05 ENCOUNTER — Ambulatory Visit (INDEPENDENT_AMBULATORY_CARE_PROVIDER_SITE_OTHER): Payer: Medicare Other

## 2018-06-05 DIAGNOSIS — R609 Edema, unspecified: Secondary | ICD-10-CM

## 2018-06-05 DIAGNOSIS — I739 Peripheral vascular disease, unspecified: Secondary | ICD-10-CM | POA: Diagnosis not present

## 2018-06-05 DIAGNOSIS — M79671 Pain in right foot: Secondary | ICD-10-CM

## 2018-06-05 DIAGNOSIS — M79672 Pain in left foot: Principal | ICD-10-CM

## 2018-06-05 DIAGNOSIS — I872 Venous insufficiency (chronic) (peripheral): Secondary | ICD-10-CM

## 2018-06-08 NOTE — Progress Notes (Signed)
Subjective:   Patient ID: Angela Wang, female   DOB: 84 y.o.   MRN: [id number]   HPI  84 year old female presents the office today for concerns of swelling to both of her legs and feet as well as her ankles.  This is been ongoing for the last several weeks.  She states that she gets more swelling her legs and feet and ankles do hurt.  She denies any recent injury or trauma to her lower extremities.  She denies any open sores or any drainage.  Review of Systems  All other systems reviewed and are negative.  Past Medical History:  Diagnosis Date  . ALLERGIC RHINITIS 06/21/2007   Qualifier: Diagnosis of  By: Wynona Luna   . Allergy    allergic rhinitis  . Anemia    nos  . ANEMIA-NOS 06/21/2007   Qualifier: Diagnosis of  By: Wynona Luna   . ANXIETY 06/21/2007   Qualifier: Diagnosis of  By: Wynona Luna   . Arthritis    osteoarthritis  . Atypical chest pain 09/2008   negative myoview  . BACK PAIN, LUMBAR 03/06/2010   Qualifier: Diagnosis of  By: Wynona Luna   . CEREBRAL ANEURYSM 06/21/2007   Qualifier: History of  By: Wynona Luna   . CEREBROVASCULAR ACCIDENT, HX OF 06/21/2007   Qualifier: Diagnosis of  By: Wynona Luna   . CHEST PAIN, ATYPICAL 09/02/2008   Qualifier: Diagnosis of  By: Wynona Luna   . Chronic sinusitis   . CONSTIPATION, CHRONIC 09/20/2009   Qualifier: Diagnosis of  By: Olevia Perches MD, Lowella Bandy   . Depression   . DEPRESSION 06/21/2007   Qualifier: Diagnosis of  By: Wynona Luna   . Diverticulosis   . DIVERTICULOSIS-COLON 06/09/2008   Qualifier: Diagnosis of  By: Chester Holstein NP, Nevin Bloodgood    . Fibromyalgia   . FIBROMYALGIA 06/21/2007   Qualifier: Diagnosis of  By: Wynona Luna   . GASTRITIS, HX OF 08/25/2008   Qualifier: Diagnosis of  By: Nelson-Smith CMA (AAMA), Dottie    . GERD 05/03/2008   Qualifier: Diagnosis of  By: Julien Girt CMA, Marliss Czar    . GERD (gastroesophageal reflux disease)   . Headache 03/15/2011  . History of solitary  pulmonary nodule    right, stable by CT  . Hyperlipidemia   . HYPERLIPIDEMIA 06/21/2007   Qualifier: Diagnosis of  By: Wynona Luna   . Hypertension   . HYPERTENSION 06/21/2007   Qualifier: Diagnosis of  By: Wynona Luna   . HYPOGLYCEMIA 10/24/2010   Qualifier: Diagnosis of  By: Wynona Luna   . HYPOTHYROIDISM 07/22/2008   Qualifier: Diagnosis of  By: Wynona Luna   . IBS 06/21/2007   Qualifier: Diagnosis of  By: Wynona Luna   . IBS (irritable bowel syndrome)   . INSOMNIA, CHRONIC 07/19/2009   Qualifier: Diagnosis of  By: Wynona Luna   . LEG PAIN, RIGHT 02/01/2009   Qualifier: Diagnosis of  By: Wynona Luna   . Leukopenia    chronic  . LEUKOPENIA, CHRONIC 06/21/2007   Qualifier: Diagnosis of  By: Wynona Luna   . NECK PAIN 02/03/2008   Qualifier: Diagnosis of  By: Wynona Luna   . NUMBNESS 01/29/2008   Qualifier: Diagnosis of  By: Loanne Drilling MD, Jacelyn Pi   . Osteoarth NOS-Unspec 06/21/2007  Centricity Description: DEGENERATIVE JOINT DISEASE, MILD Qualifier: Diagnosis of  By: Wynona Luna  Centricity Description: OSTEOARTHRITIS Qualifier: Diagnosis of  By: Wynona Luna   . PULMONARY NODULE 12/26/2007   Qualifier: Diagnosis of  By: Wynona Luna   . Routine general medical examination at a health care facility   . SCIATICA, RIGHT 05/06/2008   Qualifier: Diagnosis of  By: Nathaneil Canary, CMA, Sarah    . Stroke ([place])   . THROMBOCYTOPENIA 02/09/2009   Qualifier: Diagnosis of  By: Wynona Luna   . UNSTEADY GAIT 07/28/2009   Qualifier: Diagnosis of  By: Wynona Luna   . VENOUS INSUFFICIENCY 05/03/2008   Qualifier: Diagnosis of  By: Julien Girt CMA, Marliss Czar      Past Surgical History:  Procedure Laterality Date  . ABDOMINAL HYSTERECTOMY    . ANEURYSM COILING    . APPENDECTOMY    . CHOLECYSTECTOMY    . JOINT REPLACEMENT     total right knee replacement     Current Outpatient Medications:  .  acetaminophen-codeine (TYLENOL #3) 300-30 MG  tablet, Take 1-2 tablets by mouth every 8 (eight) hours as needed for moderate pain. (Patient not taking: Reported on 06/05/2018), Disp: 30 tablet, Rfl: 0 .  acetaminophen-codeine (TYLENOL #3) 300-30 MG tablet, Take 1-2 tablets by mouth every 6 (six) hours as needed for moderate pain. (Patient not taking: Reported on 06/05/2018), Disp: 30 tablet, Rfl: 0 .  acetaminophen-codeine (TYLENOL #3) 300-30 MG tablet, Take 1 tablet by mouth 2 (two) times daily as needed for moderate pain., Disp: 30 tablet, Rfl: 0 .  ALPRAZolam (XANAX) 0.5 MG tablet, Take 0.5 mg by mouth 3 (three) times daily as needed for sleep or anxiety., Disp: , Rfl:  .  B Complex Vitamins (B COMPLEX 100 PO), Take 1 tablet by mouth daily.  , Disp: , Rfl:  .  Calcium Carbonate-Vitamin D (CALCIUM-VITAMIN D) 500-200 MG-UNIT per tablet, Take 1 tablet by mouth 3 (three) times daily.  , Disp: , Rfl:  .  cetirizine (ZYRTEC) 5 MG tablet, Take 1 tablet (5 mg total) by mouth daily. (Patient not taking: Reported on 06/05/2018), Disp: 30 tablet, Rfl: 0 .  clopidogrel (PLAVIX) 75 MG tablet, Take 1 tablet (75 mg total) by mouth daily. (Patient not taking: Reported on 12/11/2017), Disp: 30 tablet, Rfl: 11 .  co-enzyme Q-10 30 MG capsule, Take 30 mg by mouth 3 (three) times daily.  , Disp: , Rfl:  .  doxycycline (VIBRAMYCIN) 100 MG capsule, Take 1 capsule (100 mg total) by mouth 2 (two) times daily. (Patient not taking: Reported on 06/05/2018), Disp: 20 capsule, Rfl: 0 .  fluticasone (FLONASE) 50 MCG/ACT nasal spray, Place 2 sprays into both nostrils daily., Disp: , Rfl:  .  Linaclotide (LINZESS) 145 MCG CAPS capsule, Take 145 mcg by mouth daily., Disp: , Rfl:  .  Magnesium 500 MG TABS, Take 1 tablet by mouth daily.  , Disp: , Rfl:  .  methocarbamol (ROBAXIN) 500 MG tablet, as needed. TAKES 1/2 TABLET AS NEEDED, Disp: , Rfl:  .  Multiple Vitamin (MULTI-VITAMINS) TABS, Take by mouth., Disp: , Rfl:  .  Olopatadine HCl (PATADAY) 0.2 % SOLN, Apply 1 drop to eye  daily., Disp: , Rfl:  .  triamcinolone cream (KENALOG) 0.1 %, MIX WITH CETAPHIL MOISTURIZING CREAM. APPLY THREE TIMES DAILY AS NEEDED, Disp: , Rfl:  .  vitamin C (ASCORBIC ACID) 500 MG tablet, Take 500 mg by mouth., Disp: , Rfl:  Allergies  Allergen Reactions  . Amitriptyline Hcl     REACTION: throat swelling  . Aspirin Other (See Comments)    REACTION: stomach upset  . Citalopram Other (See Comments)  . Escitalopram Oxalate     REACTION: Hallucinations  . Escitalopram Oxalate     REACTION: Hallucinations  . Gabapentin     GI Upset   . Hydrocodone-Acetaminophen     REACTION: stomach upset  . Lorazepam     REACTION: Throat Swelling  . Oxycodone Other (See Comments)    Headache, change in mental status  . Oxycodone-Acetaminophen     REACTION: Nausea and upset stomach  . Terfenadine     REACTION: stomach upset  . Tramadol Nausea And Vomiting  . Codeine Nausea Only    REACTION: stomach upset REACTION: stomach upset REACTION: stomach upset  . Latex Rash    REACTION: rash REACTION: rash REACTION: rash         Objective:  Physical Exam  General: AAO x3, NAD  Dermatological: There is chronic skin changes present to bilateral legs distally consistent with venous insufficiency.  There is no open sores there is no drainage identified.  There is no erythema or increase in warmth.  Vascular: DP pulses, PT pulses 1/4, CRT less than 3 seconds.  There is no pain with calf compression, warmth, erythema.   Neruologic: Grossly intact via light touch bilateral.  Protective threshold with Semmes Wienstein monofilament intact to all pedal sites bilateral.   Musculoskeletal: Mild diffuse tenderness to bilateral lower extremities mostly on the ankle and foot.  This appears to be over the area of swelling.  There is no specific area pinpoint bony tenderness there is no pain to vibratory sensation.  Ankle, subtalar joint range of motion intact.  Muscular strength 5/5 in all groups tested  bilateral.  Gait: Unassisted, Nonantalgic.       Assessment:   84 year old female with venous insufficiency, swelling    Plan:  -Treatment options discussed including all alternatives, risks, and complications -Etiology of symptoms were discussed -X-rays were obtained and reviewed with the patient.  X-rays of the ankle were obtained.  No evidence of acute fracture identified.  I also reviewed the x-rays from her chart. -I did an ABI in the office.  This was normal.  Left is 1.33 right was 1.30. -I think that her pain is coming from the swelling which is reverse venous insufficiency.  She previously had a venous duplex.  Unna boots were applied bilaterally to help with swelling and pain.  Continue elevation.  Discussed compression socks that she has that she could not recommend they are too small for her swelling.  Precautions were advised on removal of the Unna boots.  Trula Slade DPM

## 2018-06-10 ENCOUNTER — Ambulatory Visit (INDEPENDENT_AMBULATORY_CARE_PROVIDER_SITE_OTHER): Payer: Medicare Other | Admitting: Podiatry

## 2018-06-10 DIAGNOSIS — I872 Venous insufficiency (chronic) (peripheral): Secondary | ICD-10-CM

## 2018-06-10 DIAGNOSIS — R609 Edema, unspecified: Secondary | ICD-10-CM | POA: Diagnosis not present

## 2018-06-11 NOTE — Progress Notes (Signed)
Subjective: 84 year old female presents the office today for concerns and follow-up evaluation of bilateral swelling.  She did leave the [employer] on for about 5 days and she states that it was doing much better.  She still has some swelling her pain is also improved since using the Unna boots.  When she cut the Unna boots off in 5 days she tried wearing the compression socks but she still states they were painful as they were too tight.  Denies any open sores or any drainage otherwise. Denies any systemic complaints such as fevers, chills, nausea, vomiting. No acute changes since last appointment, and no other complaints at this time.   Objective: AAO x3, NAD DP/PT pulses palpable bilaterally, CRT less than 3 seconds There is improved swelling to bilateral legs however does continue to be mild.  There is chronic venous insufficiency skin changes present but there is no open sores or drainage.  No increase in warmth or any signs of infection.  No open lesions or pre-ulcerative lesions.  No pain with calf compression, swelling, warmth, erythema  Assessment: Venous insufficiency bilateral  Plan: -All treatment options discussed with the patient including all alternatives, risks, complications.  -Today new Unna boots were applied bilaterally.  Encouraged elevation.  Precautions were advised when to remove the Unna boots.  After she takes them off on her try the compression socks again.  We did get new ones I will be more than happy to give her a prescription for another size.  Monitor for any skin breakdown. -Patient encouraged to call the office with any questions, concerns, change in symptoms.   Angela Wang DPM

## 2018-06-18 ENCOUNTER — Ambulatory Visit (INDEPENDENT_AMBULATORY_CARE_PROVIDER_SITE_OTHER): Payer: Medicare Other | Admitting: Podiatry

## 2018-06-18 DIAGNOSIS — B351 Tinea unguium: Secondary | ICD-10-CM

## 2018-06-18 DIAGNOSIS — M722 Plantar fascial fibromatosis: Secondary | ICD-10-CM | POA: Diagnosis not present

## 2018-06-18 DIAGNOSIS — I872 Venous insufficiency (chronic) (peripheral): Secondary | ICD-10-CM | POA: Diagnosis not present

## 2018-06-18 DIAGNOSIS — M79674 Pain in right toe(s): Secondary | ICD-10-CM | POA: Diagnosis not present

## 2018-06-18 DIAGNOSIS — M79675 Pain in left toe(s): Secondary | ICD-10-CM | POA: Diagnosis not present

## 2018-06-18 DIAGNOSIS — M2560 Stiffness of unspecified joint, not elsewhere classified: Secondary | ICD-10-CM

## 2018-06-18 MED ORDER — ACETAMINOPHEN-CODEINE #3 300-30 MG PO TABS: 1.0000 | ORAL_TABLET | Freq: Three times a day (TID) | ORAL | 0 refills | Status: DC | PRN

## 2018-06-19 NOTE — Progress Notes (Signed)
Subjective: 84 year old female presents the office today for concerns and follow-up evaluation of bilateral swelling.  She removed the Unna boot as directed and she states the swelling is much improved.  She still gets some discomfort overall her pain is improving as well.  She gets some pain in the right heel.  She states that she has had stiffness to her ankles for quite some time.  Denies any recent injury.  She also asking for nails be trimmed today as are thick and elongated she cannot trim them herself.  Denies any redness or drainage from the toenail sites.  She is asking for Tylenol 3 given the pain to her foot at times.  She has no other concerns today.  Objective: AAO x3, NAD DP/PT pulses palpable bilaterally, CRT less than 3 seconds There is improved swelling present bilaterally however there is some chronic venous insufficiency changes to the skin present bilaterally without any ulceration or drainage.  There is no increase in warmth and there is no fluctuation or crepitation.  There is no tenderness to palpation of the ankle however the ankle joint range of motion is restricted.  Mild discomfort to the plantar medial tubercle of the calcaneus at the insertion of plantar fascia on the right foot.  Overall there is no other areas of pinpoint bony tenderness or pain to vibratory sensation.  Her nails are thick, discolored, dystrophic as well there is tenderness nails 1-5 bilaterally.  No edema, erythema, drainage or pus or any signs of infection present. No pain with calf compression, swelling, warmth, erythema  Assessment: Venous insufficiency bilateral; plantar fasciitis; symptomatic onychomycosis  Plan: -All treatment options discussed with the patient including all alternatives, risks, complications.  -At this point her swelling is much improved.  I want her to wear compression socks.  She has some at home but they are too tight.  She is going to go purchase new compression socks. She is  going to go to [place] to purchase these. -Given her heel pain as well as ankle stiffness or pain start physical therapy prescription for [facility] was written today.  She is asking for Tylenol 3 and this is been given to her by orthopedics previously.  Discussed a one-time prescription of this. -Nails debrided x10 without any complications or bleeding.  Trula Slade DPM

## 2018-07-17 ENCOUNTER — Ambulatory Visit: Payer: Medicare Other | Admitting: Podiatry

## 2018-09-05 ENCOUNTER — Emergency Department (HOSPITAL_BASED_OUTPATIENT_CLINIC_OR_DEPARTMENT_OTHER)
Admission: EM | Admit: 2018-09-05 | Discharge: 2018-09-05 | Disposition: A | Discharge status: Home / Self Care | Payer: Medicare Other | Attending: Emergency Medicine | Admitting: Emergency Medicine

## 2018-09-05 ENCOUNTER — Other Ambulatory Visit: Payer: Self-pay

## 2018-09-05 ENCOUNTER — Encounter (HOSPITAL_BASED_OUTPATIENT_CLINIC_OR_DEPARTMENT_OTHER): Payer: Self-pay

## 2018-09-05 ENCOUNTER — Emergency Department (HOSPITAL_BASED_OUTPATIENT_CLINIC_OR_DEPARTMENT_OTHER): Payer: Medicare Other

## 2018-09-05 DIAGNOSIS — Z96651 Presence of right artificial knee joint: Secondary | ICD-10-CM | POA: Insufficient documentation

## 2018-09-05 DIAGNOSIS — R519 Headache, unspecified: Secondary | ICD-10-CM

## 2018-09-05 DIAGNOSIS — Z7902 Long term (current) use of antithrombotics/antiplatelets: Secondary | ICD-10-CM | POA: Diagnosis not present

## 2018-09-05 DIAGNOSIS — J019 Acute sinusitis, unspecified: Secondary | ICD-10-CM | POA: Insufficient documentation

## 2018-09-05 DIAGNOSIS — I10 Essential (primary) hypertension: Secondary | ICD-10-CM | POA: Insufficient documentation

## 2018-09-05 DIAGNOSIS — Z79899 Other long term (current) drug therapy: Secondary | ICD-10-CM | POA: Diagnosis not present

## 2018-09-05 DIAGNOSIS — E039 Hypothyroidism, unspecified: Secondary | ICD-10-CM | POA: Insufficient documentation

## 2018-09-05 DIAGNOSIS — R51 Headache: Secondary | ICD-10-CM

## 2018-09-05 DIAGNOSIS — Z91040 Latex allergy status: Secondary | ICD-10-CM | POA: Diagnosis not present

## 2018-09-05 DIAGNOSIS — J0191 Acute recurrent sinusitis, unspecified: Secondary | ICD-10-CM

## 2018-09-05 MED ORDER — HYDROMORPHONE HCL 1 MG/ML IJ SOLN
1.0000 mg | Freq: Once | INTRAMUSCULAR | Status: AC
  Administered 2018-09-05: 1 mg via INTRAMUSCULAR
  Filled 2018-09-05: qty 1

## 2018-09-05 MED ORDER — OXYCODONE-ACETAMINOPHEN 5-325 MG PO TABS: 1.0000 | ORAL_TABLET | Freq: Four times a day (QID) | ORAL | 0 refills | Status: DC | PRN

## 2018-09-05 MED ORDER — METHYLPREDNISOLONE SODIUM SUCC 125 MG IJ SOLR
80.0000 mg | Freq: Once | INTRAMUSCULAR | Status: AC
  Administered 2018-09-05: 80 mg via INTRAMUSCULAR
  Filled 2018-09-05: qty 2

## 2018-09-05 MED ORDER — AMOXICILLIN-POT CLAVULANATE 500-125 MG PO TABS: 1.0000 | ORAL_TABLET | Freq: Three times a day (TID) | ORAL | 0 refills | Status: DC

## 2018-09-05 NOTE — ED Triage Notes (Signed)
C/o posterior neck and right side face pain started yesterday-denies injury-states pain is worse with movement-drove self to ED

## 2018-09-05 NOTE — Discharge Instructions (Addendum)
Augmentin as prescribed.  Percocet as prescribed as needed for pain.  Follow up with your primary doctor if not improving in the next 3 to 4 days.

## 2018-09-05 NOTE — ED Notes (Signed)
ED Provider at bedside. 

## 2018-09-05 NOTE — ED Provider Notes (Signed)
[facility] Provider Note   CSN: [id number] Arrival date & time: 09/05/18  1135     History   Chief Complaint Chief Complaint  Patient presents with  . Neck Pain    HPI Angela Wang is a 84 y.o. female.  Patient is an 84 year old female with history of hypertension, hyperlipidemia, fibromyalgia, and GERD.  She presents today for evaluation of headache and neck pain.  She reports URI-like symptoms for the past 2 weeks, then the headache started yesterday.  She has had sinusitis in the past and feels as though this is the same.  She denies fevers or chills.  The history is provided by the patient.  Neck Pain   This is a new problem. The current episode started yesterday. The problem occurs constantly. The problem has been rapidly worsening. The pain is associated with nothing. There has been no fever. The pain is severe. The pain is the same all the time. She has tried nothing for the symptoms.    Past Medical History:  Diagnosis Date  . ALLERGIC RHINITIS 06/21/2007   Qualifier: Diagnosis of  By: Wynona Luna   . Allergy    allergic rhinitis  . Anemia    nos  . ANEMIA-NOS 06/21/2007   Qualifier: Diagnosis of  By: Wynona Luna   . ANXIETY 06/21/2007   Qualifier: Diagnosis of  By: Wynona Luna   . Arthritis    osteoarthritis  . Atypical chest pain 09/2008   negative myoview  . BACK PAIN, LUMBAR 03/06/2010   Qualifier: Diagnosis of  By: Wynona Luna   . CEREBRAL ANEURYSM 06/21/2007   Qualifier: History of  By: Wynona Luna   . CEREBROVASCULAR ACCIDENT, HX OF 06/21/2007   Qualifier: Diagnosis of  By: Wynona Luna   . CHEST PAIN, ATYPICAL 09/02/2008   Qualifier: Diagnosis of  By: Wynona Luna   . Chronic sinusitis   . CONSTIPATION, CHRONIC 09/20/2009   Qualifier: Diagnosis of  By: Olevia Perches MD, Lowella Bandy   . Depression   . DEPRESSION 06/21/2007   Qualifier: Diagnosis of  By: Wynona Luna   . Diverticulosis     . DIVERTICULOSIS-COLON 06/09/2008   Qualifier: Diagnosis of  By: Chester Holstein NP, Nevin Bloodgood    . Fibromyalgia   . FIBROMYALGIA 06/21/2007   Qualifier: Diagnosis of  By: Wynona Luna   . GASTRITIS, HX OF 08/25/2008   Qualifier: Diagnosis of  By: Nelson-Smith CMA (AAMA), Dottie    . GERD 05/03/2008   Qualifier: Diagnosis of  By: Julien Girt CMA, Marliss Czar    . GERD (gastroesophageal reflux disease)   . Headache 03/15/2011  . History of solitary pulmonary nodule    right, stable by CT  . Hyperlipidemia   . HYPERLIPIDEMIA 06/21/2007   Qualifier: Diagnosis of  By: Wynona Luna   . Hypertension   . HYPERTENSION 06/21/2007   Qualifier: Diagnosis of  By: Wynona Luna   . HYPOGLYCEMIA 10/24/2010   Qualifier: Diagnosis of  By: Wynona Luna   . HYPOTHYROIDISM 07/22/2008   Qualifier: Diagnosis of  By: Wynona Luna   . IBS 06/21/2007   Qualifier: Diagnosis of  By: Wynona Luna   . IBS (irritable bowel syndrome)   . INSOMNIA, CHRONIC 07/19/2009   Qualifier: Diagnosis of  By: Wynona Luna   . LEG PAIN, RIGHT 02/01/2009  Qualifier: Diagnosis of  By: Wynona Luna   . Leukopenia    chronic  . LEUKOPENIA, CHRONIC 06/21/2007   Qualifier: Diagnosis of  By: Wynona Luna   . NECK PAIN 02/03/2008   Qualifier: Diagnosis of  By: Wynona Luna   . NUMBNESS 01/29/2008   Qualifier: Diagnosis of  By: Loanne Drilling MD, Jacelyn Pi   . Docia Furl NOS-Unspec 06/21/2007   Centricity Description: DEGENERATIVE JOINT DISEASE, MILD Qualifier: Diagnosis of  By: Wynona Luna  Centricity Description: OSTEOARTHRITIS Qualifier: Diagnosis of  By: Wynona Luna   . PULMONARY NODULE 12/26/2007   Qualifier: Diagnosis of  By: Wynona Luna   . Routine general medical examination at a health care facility   . SCIATICA, RIGHT 05/06/2008   Qualifier: Diagnosis of  By: Nathaneil Canary, CMA, Sarah    . Stroke ([place])   . THROMBOCYTOPENIA 02/09/2009   Qualifier: Diagnosis of  By: Wynona Luna   . UNSTEADY GAIT  07/28/2009   Qualifier: Diagnosis of  By: Wynona Luna   . VENOUS INSUFFICIENCY 05/03/2008   Qualifier: Diagnosis of  By: Julien Girt CMA, Leigh      Patient Active Problem List   Diagnosis Date Noted  . Cerebral embolism with cerebral infarction ([place]) 07/10/2013  . Headache(784.0) 03/15/2011  . HYPOGLYCEMIA 10/24/2010  . BACK PAIN, LUMBAR 03/06/2010  . CONSTIPATION, CHRONIC 09/20/2009  . SINUSITIS, CHRONIC 08/16/2009  . UNSTEADY GAIT 07/28/2009  . INSOMNIA, CHRONIC 07/19/2009  . THROMBOCYTOPENIA 02/09/2009  . LEG PAIN, RIGHT 02/01/2009  . Other abnormal glucose 02/01/2009  . CHEST PAIN, ATYPICAL 09/02/2008  . GASTRITIS, HX OF 08/25/2008  . HYPOTHYROIDISM 07/22/2008  . DIVERTICULOSIS-COLON 06/09/2008  . Sciatica of left side 05/06/2008  . VENOUS INSUFFICIENCY 05/03/2008  . GERD 05/03/2008  . NECK PAIN 02/03/2008  . NUMBNESS 01/29/2008  . PULMONARY NODULE 12/26/2007  . HYPERLIPIDEMIA 06/21/2007  . ANEMIA-NOS 06/21/2007  . LEUKOPENIA, CHRONIC 06/21/2007  . ANXIETY 06/21/2007  . DEPRESSION 06/21/2007  . HYPERTENSION 06/21/2007  . CEREBRAL ANEURYSM 06/21/2007  . ALLERGIC RHINITIS 06/21/2007  . IBS 06/21/2007  . Osteoarthrosis, unspecified whether generalized or localized, unspecified site 06/21/2007  . FIBROMYALGIA 06/21/2007  . CEREBROVASCULAR ACCIDENT, HX OF 06/21/2007    Past Surgical History:  Procedure Laterality Date  . ABDOMINAL HYSTERECTOMY    . ANEURYSM COILING    . APPENDECTOMY    . CHOLECYSTECTOMY    . JOINT REPLACEMENT     total right knee replacement  . KNEE SURGERY       OB History   None      Home Medications    Prior to Admission medications   Medication Sig Start Date End Date Taking? Authorizing Provider  ALPRAZolam Duanne Moron) 0.5 MG tablet Take 0.5 mg by mouth 3 (three) times daily as needed for sleep or anxiety.   Yes [provider]  B Complex Vitamins (B COMPLEX 100 PO) Take 1 tablet by mouth daily.     Yes [provider]    Calcium Carbonate-Vitamin D (CALCIUM-VITAMIN D) 500-200 MG-UNIT per tablet Take 1 tablet by mouth 3 (three) times daily.     Yes [provider]  fluticasone (FLONASE) 50 MCG/ACT nasal spray Place 2 sprays into both nostrils daily.   Yes [provider]  Linaclotide (LINZESS) 145 MCG CAPS capsule Take 145 mcg by mouth daily.   Yes [provider]  Magnesium 500 MG TABS Take 1 tablet by mouth daily.  Yes [provider]  acetaminophen-codeine (TYLENOL #3) 300-30 MG tablet Take 1-2 tablets by mouth every 8 (eight) hours as needed for moderate pain. Patient not taking: Reported on 06/05/2018 10/28/17   Leandrew Koyanagi, MD  acetaminophen-codeine (TYLENOL #3) 300-30 MG tablet Take 1-2 tablets by mouth every 6 (six) hours as needed for moderate pain. Patient not taking: Reported on 06/05/2018 01/30/18   Aundra Dubin, PA-C  acetaminophen-codeine (TYLENOL #3) 300-30 MG tablet Take 1 tablet by mouth 2 (two) times daily as needed for moderate pain. 05/14/18   Leandrew Koyanagi, MD  acetaminophen-codeine (TYLENOL #3) 300-30 MG tablet Take 1 tablet by mouth every 8 (eight) hours as needed for moderate pain. 06/18/18   Trula Slade, DPM  cetirizine (ZYRTEC) 5 MG tablet Take 1 tablet (5 mg total) by mouth daily. Patient not taking: Reported on 06/05/2018 03/17/18   Augusto Gamble B, NP  clopidogrel (PLAVIX) 75 MG tablet Take 1 tablet (75 mg total) by mouth daily. Patient not taking: Reported on 12/11/2017 12/20/16   Garvin Fila, MD  co-enzyme Q-10 30 MG capsule Take 30 mg by mouth 3 (three) times daily.      [provider]  doxycycline (VIBRAMYCIN) 100 MG capsule Take 1 capsule (100 mg total) by mouth 2 (two) times daily. Patient not taking: Reported on 06/05/2018 03/17/18   Augusto Gamble B, NP  methocarbamol (ROBAXIN) 500 MG tablet as needed. TAKES 1/2 TABLET AS NEEDED 04/21/14   [provider]  Multiple Vitamin (MULTI-VITAMINS) TABS Take by mouth.     [provider]  Olopatadine HCl (PATADAY) 0.2 % SOLN Apply 1 drop to eye daily.    [provider]  triamcinolone cream (KENALOG) 0.1 % MIX WITH CETAPHIL MOISTURIZING CREAM. APPLY THREE TIMES DAILY AS NEEDED 10/30/16   [provider]  vitamin C (ASCORBIC ACID) 500 MG tablet Take 500 mg by mouth.    [provider]    Family History Family History  Problem Relation Age of Onset  . Cancer Mother        pancreatic  . Coronary artery disease Father   . Cancer Sister         breast  . Asthma Sister   . Breast cancer Sister   . Cancer Brother        prostate  . Allergies Brother   . Cancer Maternal Aunt        colon  . Diabetes Other   . Cancer Other        ovarian    Social History Social History   Tobacco Use  . Smoking status: Never Smoker  . Smokeless tobacco: Never Used  Substance Use Topics  . Alcohol use: No  . Drug use: No     Allergies   Amitriptyline hcl; Aspirin; Citalopram; Escitalopram oxalate; Escitalopram oxalate; Gabapentin; Hydrocodone-acetaminophen; Lorazepam; Oxycodone; Oxycodone-acetaminophen; Terfenadine; Tramadol; Codeine; and Latex   Review of Systems Review of Systems  Musculoskeletal: Positive for neck pain.  All other systems reviewed and are negative.    Physical Exam Updated Vital Signs BP (!) 142/78 (BP Location: Left Arm)   Pulse 76   Temp 98.7 F (37.1 C) (Oral)   Resp 18   Ht 5\' 5"  (1.651 m)   Wt 82.6 kg   SpO2 98%   BMI 30.29 kg/m   Physical Exam  Constitutional: She is oriented to person, place, and time. She appears well-developed and well-nourished. No distress.  HENT:  Head: Normocephalic and atraumatic.  Mouth/Throat: Oropharynx is clear and moist.  TMs and ear canals are clear bilaterally.  Eyes: Pupils are equal, round, and reactive to light. EOM are normal.  Neck: Normal range of motion. Neck supple.  Cardiovascular: Normal rate and regular rhythm. Exam reveals no gallop and  no friction rub.  No murmur heard. Pulmonary/Chest: Effort normal and breath sounds normal. No respiratory distress. She has no wheezes.  Abdominal: Soft. Bowel sounds are normal. She exhibits no distension. There is no tenderness.  Musculoskeletal: Normal range of motion.  Neurological: She is alert and oriented to person, place, and time. No cranial nerve deficit. She exhibits normal muscle tone. Coordination normal.  Skin: Skin is warm and dry. She is not diaphoretic.  Nursing note and vitals reviewed.    ED Treatments / Results  Labs (all labs ordered are listed, but only abnormal results are displayed) Labs Reviewed - No data to display  EKG None  Radiology Ct Head Wo Contrast  Result Date: 09/05/2018 CLINICAL DATA:  Headache with right neck pain. EXAM: CT HEAD WITHOUT CONTRAST CT CERVICAL SPINE WITHOUT CONTRAST TECHNIQUE: Multidetector CT imaging of the head and cervical spine was performed following the standard protocol without intravenous contrast. Multiplanar CT image reconstructions of the cervical spine were also generated. COMPARISON:  Head CT January 09, 2011 FINDINGS: CT HEAD FINDINGS Brain: No evidence of acute infarction, hemorrhage, hydrocephalus, extra-axial collection or mass lesion/mass effect. Aneurysm coil is identified in the region of right middle cerebral artery unchanged. Vascular: Aneurysm coil is identified in the region of right middle cerebral artery unchanged. No hyperdense vessel is noted. Skull: Normal. Negative for fracture or focal lesion. Sinuses/Orbits: Complete opacity with dense material is identified within the right maxillary sinus unchanged compared to prior CT of 2012. There is air-fluid level with mucoperiosteal thickening involving the right sphenoid sinus. Mucoperiosteal thickening of bilateral ethmoid sinuses are noted. Other: None CT CERVICAL SPINE FINDINGS Alignment: Normal. Skull base and vertebrae: No acute fracture. No primary bone lesion  or focal pathologic process. Soft tissues and spinal canal: No prevertebral fluid or swelling. No visible canal hematoma. Disc levels: There are marked osteoarthritic changes the C1 and C2 level. Mild osteoarthritic changes are identified elsewhere in the cervical spine. Upper chest: Negative. Other: None. IMPRESSION: No focal acute intracranial abnormality identified. Findings consistent with acute sphenoid sinusitis. No acute abnormality identified in the cervical spine. Degenerative joint changes are noted. Electronically Signed   By: Abelardo Diesel M.D.   On: 09/05/2018 12:58   Ct Cervical Spine Wo Contrast  Result Date: 09/05/2018 CLINICAL DATA:  Headache with right neck pain. EXAM: CT HEAD WITHOUT CONTRAST CT CERVICAL SPINE WITHOUT CONTRAST TECHNIQUE: Multidetector CT imaging of the head and cervical spine was performed following the standard protocol without intravenous contrast. Multiplanar CT image reconstructions of the cervical spine were also generated. COMPARISON:  Head CT January 09, 2011 FINDINGS: CT HEAD FINDINGS Brain: No evidence of acute infarction, hemorrhage, hydrocephalus, extra-axial collection or mass lesion/mass effect. Aneurysm coil is identified in the region of right middle cerebral artery unchanged. Vascular: Aneurysm coil is identified in the region of right middle cerebral artery unchanged. No hyperdense vessel is noted. Skull: Normal. Negative for fracture or focal lesion. Sinuses/Orbits: Complete opacity with dense material is identified within the right maxillary sinus unchanged compared to prior CT of 2012. There is air-fluid level with mucoperiosteal thickening involving the right sphenoid sinus. Mucoperiosteal thickening of bilateral ethmoid sinuses are noted. Other: None CT CERVICAL SPINE FINDINGS Alignment: Normal.  Skull base and vertebrae: No acute fracture. No primary bone lesion or focal pathologic process. Soft tissues and spinal canal: No prevertebral fluid or  swelling. No visible canal hematoma. Disc levels: There are marked osteoarthritic changes the C1 and C2 level. Mild osteoarthritic changes are identified elsewhere in the cervical spine. Upper chest: Negative. Other: None. IMPRESSION: No focal acute intracranial abnormality identified. Findings consistent with acute sphenoid sinusitis. No acute abnormality identified in the cervical spine. Degenerative joint changes are noted. Electronically Signed   By: Abelardo Diesel M.D.   On: 09/05/2018 12:58    Procedures Procedures (including critical care time)  Medications Ordered in ED Medications  methylPREDNISolone sodium succinate (SOLU-MEDROL) 125 mg/2 mL injection 80 mg (80 mg Intramuscular Given 09/05/18 1210)  HYDROmorphone (DILAUDID) injection 1 mg (1 mg Intramuscular Given 09/05/18 1219)     Initial Impression / Assessment and Plan / ED Course  I have reviewed the triage vital signs and the nursing notes.  Pertinent labs & imaging results that were available during my care of the patient were reviewed by me and considered in my medical decision making (see chart for details).  CT scan shows acute sphenoid sinusitis, but no other acute abnormality.  She will be treated with Augmentin and pain medication.  She is to follow-up with her primary doctor.  Final Clinical Impressions(s) / ED Diagnoses   Final diagnoses:  None    ED Discharge Orders    None       Veryl Speak, MD 09/05/18 1332

## 2018-09-16 ENCOUNTER — Other Ambulatory Visit: Payer: Self-pay

## 2018-09-16 ENCOUNTER — Ambulatory Visit: Payer: Medicare Other | Attending: Physician Assistant | Admitting: Physical Therapy

## 2018-09-16 DIAGNOSIS — M25662 Stiffness of left knee, not elsewhere classified: Secondary | ICD-10-CM | POA: Diagnosis present

## 2018-09-16 DIAGNOSIS — M25562 Pain in left knee: Secondary | ICD-10-CM | POA: Diagnosis not present

## 2018-09-16 DIAGNOSIS — G8929 Other chronic pain: Secondary | ICD-10-CM | POA: Insufficient documentation

## 2018-09-16 DIAGNOSIS — R262 Difficulty in walking, not elsewhere classified: Secondary | ICD-10-CM | POA: Diagnosis present

## 2018-09-16 DIAGNOSIS — M25661 Stiffness of right knee, not elsewhere classified: Secondary | ICD-10-CM | POA: Diagnosis present

## 2018-09-16 DIAGNOSIS — M25561 Pain in right knee: Secondary | ICD-10-CM | POA: Diagnosis present

## 2018-09-16 DIAGNOSIS — M6281 Muscle weakness (generalized): Secondary | ICD-10-CM | POA: Diagnosis present

## 2018-09-16 NOTE — Therapy (Signed)
[facility] [place], [place], [address] Phone: [phone]   Fax:  [phone]  Physical Therapy Evaluation  Patient Details  Name: Angela Wang MRN: [id number] Date of Birth: 1931/10/29 Referring Provider (PT): Shilts, Keenan Bachelor PA   Encounter Date: 09/16/2018  PT End of Session - 09/16/18 0954    Visit Number  1    Number of Visits  16    Date for PT Re-Evaluation  11/11/18    Authorization Type  [employer] Medicare, KX modifier after 15  progress note after note 10    PT Start Time  0935    PT Stop Time  1015    PT Time Calculation (min)  40 min    Activity Tolerance  Patient tolerated treatment well    Behavior During Therapy  [facility] for tasks assessed/performed       Past Medical History:  Diagnosis Date  . ALLERGIC RHINITIS 06/21/2007   Qualifier: Diagnosis of  By: Wynona Luna   . Allergy    allergic rhinitis  . Anemia    nos  . ANEMIA-NOS 06/21/2007   Qualifier: Diagnosis of  By: Wynona Luna   . ANXIETY 06/21/2007   Qualifier: Diagnosis of  By: Wynona Luna   . Arthritis    osteoarthritis  . Atypical chest pain 09/2008   negative myoview  . BACK PAIN, LUMBAR 03/06/2010   Qualifier: Diagnosis of  By: Wynona Luna   . CEREBRAL ANEURYSM 06/21/2007   Qualifier: History of  By: Wynona Luna   . CEREBROVASCULAR ACCIDENT, HX OF 06/21/2007   Qualifier: Diagnosis of  By: Wynona Luna   . CHEST PAIN, ATYPICAL 09/02/2008   Qualifier: Diagnosis of  By: Wynona Luna   . Chronic sinusitis   . CONSTIPATION, CHRONIC 09/20/2009   Qualifier: Diagnosis of  By: Olevia Perches MD, Lowella Bandy   . Depression   . DEPRESSION 06/21/2007   Qualifier: Diagnosis of  By: Wynona Luna   . Diverticulosis   . DIVERTICULOSIS-COLON 06/09/2008   Qualifier: Diagnosis of  By: Chester Holstein NP, Nevin Bloodgood    . Fibromyalgia   . FIBROMYALGIA 06/21/2007   Qualifier: Diagnosis of  By: Wynona Luna   . GASTRITIS, HX OF  08/25/2008   Qualifier: Diagnosis of  By: Nelson-Smith CMA (AAMA), Dottie    . GERD 05/03/2008   Qualifier: Diagnosis of  By: Julien Girt CMA, Marliss Czar    . GERD (gastroesophageal reflux disease)   . Headache 03/15/2011  . History of solitary pulmonary nodule    right, stable by CT  . Hyperlipidemia   . HYPERLIPIDEMIA 06/21/2007   Qualifier: Diagnosis of  By: Wynona Luna   . Hypertension   . HYPERTENSION 06/21/2007   Qualifier: Diagnosis of  By: Wynona Luna   . HYPOGLYCEMIA 10/24/2010   Qualifier: Diagnosis of  By: Wynona Luna   . HYPOTHYROIDISM 07/22/2008   Qualifier: Diagnosis of  By: Wynona Luna   . IBS 06/21/2007   Qualifier: Diagnosis of  By: Wynona Luna   . IBS (irritable bowel syndrome)   . INSOMNIA, CHRONIC 07/19/2009   Qualifier: Diagnosis of  By: Wynona Luna   . LEG PAIN, RIGHT 02/01/2009   Qualifier: Diagnosis of  By: Wynona Luna   . Leukopenia    chronic  . LEUKOPENIA, CHRONIC 06/21/2007   Qualifier:  Diagnosis of  By: Wynona Luna   . NECK PAIN 02/03/2008   Qualifier: Diagnosis of  By: Wynona Luna   . NUMBNESS 01/29/2008   Qualifier: Diagnosis of  By: Loanne Drilling MD, Jacelyn Pi   . Docia Furl NOS-Unspec 06/21/2007   Centricity Description: DEGENERATIVE JOINT DISEASE, MILD Qualifier: Diagnosis of  By: Wynona Luna  Centricity Description: OSTEOARTHRITIS Qualifier: Diagnosis of  By: Wynona Luna   . PULMONARY NODULE 12/26/2007   Qualifier: Diagnosis of  By: Wynona Luna   . Routine general medical examination at a health care facility   . SCIATICA, RIGHT 05/06/2008   Qualifier: Diagnosis of  By: Nathaneil Canary, CMA, Sarah    . Stroke ([place])   . THROMBOCYTOPENIA 02/09/2009   Qualifier: Diagnosis of  By: Wynona Luna   . UNSTEADY GAIT 07/28/2009   Qualifier: Diagnosis of  By: Wynona Luna   . VENOUS INSUFFICIENCY 05/03/2008   Qualifier: Diagnosis of  By: Julien Girt CMA, Marliss Czar      Past Surgical History:  Procedure Laterality Date  .  ABDOMINAL HYSTERECTOMY    . ANEURYSM COILING    . APPENDECTOMY    . CHOLECYSTECTOMY    . JOINT REPLACEMENT     total right knee replacement  . KNEE SURGERY      There were no vitals filed for this visit.   Subjective Assessment - 09/16/18 0944    Subjective  I fell after Fathers Day 2019  I had to call the EMS to come and help me. I could not get up out  of the floor    Pertinent History  multiple medical problems, See Health record.  CVA '95 and 2015, (right weakness)    Limitations  Standing;Walking;House hold activities    How long can you sit comfortably?  unlimited    How long can you stand comfortably?  5-10 minu    How long can you walk comfortably?  5-10 min    Diagnostic tests  x ray    Patient Stated Goals  Goals are to walk better,  be able to get out of my chair, be able to get up from the floor is I need to    Currently in Pain?  Yes    Pain Score  7     Pain Location  Knee    Pain Orientation  Right    Pain Descriptors / Indicators  Aching;Throbbing    Pain Type  Chronic pain    Pain Radiating Towards  old TKR 2015    Pain Onset  More than a month ago    Pain Frequency  Intermittent    Aggravating Factors   not moving.  stiars,    Pain Score  5    Pain Location  Knee    Pain Orientation  Left    Pain Descriptors / Indicators  Aching    Pain Type  Chronic pain    Pain Onset  More than a month ago    Pain Frequency  Constant    Aggravating Factors   started after my fall in June 2019         [facility] PT Assessment - 09/16/18 4627      Assessment   Medical Diagnosis  Left Knee OA, bil knee pain     Referring Provider (PT)  Shilts, Keenan Bachelor PA    Onset Date/Surgical Date  05/12/18   fall after Fathers Day   Hand  Dominance  Right    Next MD Visit  Will see in March 2020    Prior Therapy  none      Precautions   Other Brace/Splint  neoprine sleeve on left      Restrictions   Weight Bearing Restrictions  No      Balance Screen   Has the patient  fallen in the past 6 months  Yes      [place] residence    Living Arrangements  Alone    Type of [place] to enter    Entrance Stairs-Number of Steps  1    Entrance Stairs-Rails  None    Home Layout  One level      Prior Function   Level of Independence  Independent with household mobility with device      Cognition   Overall Cognitive Status  Within Functional Limits for tasks assessed      Observation/Other Assessments   Observations  Pt with knee valgus right > left    Focus on Therapeutic Outcomes (FOTO)   FOTO intake27%, limitation 73%  predicted 57%      Functional Tests   Functional tests  Sit to Stand      Sit to Stand   Comments  unable to perform 5 x sts      ROM / Strength   AROM / PROM / Strength  AROM;Strength      AROM   Overall AROM   Deficits    Right Knee Extension  8    Right Knee Flexion  97    Left Knee Extension  10    Left Knee Flexion  110      Strength   Overall Strength  Deficits    Right Hip Flexion  4-/5    Right Hip Extension  3-/5    Right Hip ABduction  2/5    Left Hip Flexion  4-/5    Left Hip Extension  3-/5    Left Hip ABduction  2/5    Right Knee Flexion  4-/5    Right Knee Extension  4-/5    Left Knee Flexion  4-/5    Left Knee Extension  4-/5    Right Ankle Plantar Flexion  3-/5    Left Ankle Plantar Flexion  3-/5      Flexibility   Hamstrings  tightness right 50, left 57       Palpation   Patella mobility  decreased patellar mobiity bilaterally    Palpation comment  global tenderness bilaterally , nothing specific by pt       Ambulation/Gait   Ambulation Distance (Feet)  150 Feet    Assistive device  Small based quad cane    Gait Pattern  Step-to pattern;Decreased hip/knee flexion - right;Decreased hip/knee flexion - left;Decreased stride length    Ambulation Surface  Level    Gait velocity  1.77 ft/sec    Gait Comments  unable to negotiate steps  without pain and safely                Objective measurements completed on examination: See above findings.    therapeutic EXercises Quad sets 1 x 10 on right and left with VC and TC SAQ 1 set R and L 1 x 10 LAQ 1 set x 10 R and L SLR 1 set z 1 x 10 Heel slide 1 x 10  Right  and left           PT Education - 09/16/18 0953    Education Details  POC, Explanation of findings  initial HEP    Person(s) Educated  Patient    Methods  Explanation;Demonstration;Tactile cues;Verbal cues;Handout    Comprehension  Verbalized understanding;Returned demonstration       PT Short Term Goals - 09/16/18 1523      PT SHORT TERM GOAL #1   Title  "Independent with initial HEP    Time  3    Period  Weeks    Status  New    Target Date  10/07/18      PT SHORT TERM GOAL #2   Title  "Report pain decrease at rest from  7 /10 to  4 /10 negotiating steps    Time  3    Period  Weeks    Status  New    Target Date  10/07/18      PT SHORT TERM GOAL #3   Title  Pt will be able to perform 5 x sts in 15 sec or less to show increased LE strengthening    Baseline  Pt unable to perform at evaluation    Time  3    Period  Weeks    Status  New    Target Date  10/07/18        PT Long Term Goals - 09/16/18 1531      PT LONG TERM GOAL #1   Title  "Pt will be independent with advanced HEP.     Time  8    Period  Weeks    Status  New    Target Date  11/11/18      PT LONG TERM GOAL #2   Title  "Pt will improve L and R knee/hip  extensor/flexor strength to >/= 4+/5 with </= 2/10 pain to promote safety with walking/standing activities    Baseline  see flowsheet    Time  8    Period  Weeks    Status  New    Target Date  11/11/18      PT LONG TERM GOAL #3   Title  "FOTO will improve from 73% limitation   to  57% limitation   indicating improved functional mobility .     Time  8    Period  Weeks    Status  New    Target Date  11/11/18      PT LONG TERM GOAL #4   Title  Pt will  be instructed and demonstrate floor to mat transfer in order to care for herself living alone with safety    Time  8    Period  Weeks    Status  New    Target Date  11/11/18      PT LONG TERM GOAL #5   Title  Pt will be able to negotiate steps with =/< 3/10 pain with safety and awareness of fall prevention    Time  8    Period  Weeks    Status  New    Target Date  11/11/18      PT LONG TERM GOAL #6   Title  "Pt will improve her L/R knee flexion to  </= 115 degrees and extension to </= 5 degrees with </= 2/10 pain for a more functional and efficient gait pattern    Time  8    Period  Weeks    Status  New  Target Date  11/11/18             Plan - 09/16/18 1504    Clinical Impression Statement  84 yo female living alone at home and using small based quad cane. fell at her home just past Fathers Day 2019.  She was unable to rise from floor and called EMS.  Pt presents with difficulty walking, muscle weakness and decreased AROM and pain in right and left knees.  Pt is fearful of falling again and not being able to care for herself.  Pt would benefit from skilled PT to address bilateral LE weakness, decreaeased AROm and pain as well as gait abnormalities and balance.      Clinical Presentation  Evolving    Clinical Decision Making  Moderate    Rehab Potential  Good    PT Frequency  2x / week    PT Duration  8 weeks    PT Treatment/Interventions  ADLs/Self Care Home Management;Cryotherapy;Electrical Stimulation;Iontophoresis 4mg /ml Dexamethasone;Moist Heat;Ultrasound;Gait training;Stair training;Functional mobility training;Therapeutic activities;Therapeutic exercise;Balance training;Neuromuscular re-education;Patient/family education;Manual techniques;Passive range of motion;Scar mobilization;Dry needling;Taping    PT Next Visit Plan  level 1 knee exercises    PT Home Exercise Plan  quad set, heel slide , SLR    Consulted and Agree with Plan of Care  Patient       Patient will  benefit from skilled therapeutic intervention in order to improve the following deficits and impairments:  Abnormal gait, Difficulty walking, Obesity, Pain, Decreased mobility, Decreased range of motion, Decreased strength, Increased fascial restricitons, Postural dysfunction, Improper body mechanics  Visit Diagnosis: Chronic pain of left knee  Chronic pain of right knee  Muscle weakness (generalized)  Stiffness of left knee, not elsewhere classified  Stiffness of right knee, not elsewhere classified  Difficulty in walking, not elsewhere classified     Problem List Patient Active Problem List   Diagnosis Date Noted  . Cerebral embolism with cerebral infarction ([place]) 07/10/2013  . Headache(784.0) 03/15/2011  . HYPOGLYCEMIA 10/24/2010  . BACK PAIN, LUMBAR 03/06/2010  . CONSTIPATION, CHRONIC 09/20/2009  . SINUSITIS, CHRONIC 08/16/2009  . UNSTEADY GAIT 07/28/2009  . INSOMNIA, CHRONIC 07/19/2009  . THROMBOCYTOPENIA 02/09/2009  . LEG PAIN, RIGHT 02/01/2009  . Other abnormal glucose 02/01/2009  . CHEST PAIN, ATYPICAL 09/02/2008  . GASTRITIS, HX OF 08/25/2008  . HYPOTHYROIDISM 07/22/2008  . DIVERTICULOSIS-COLON 06/09/2008  . Sciatica of left side 05/06/2008  . VENOUS INSUFFICIENCY 05/03/2008  . GERD 05/03/2008  . NECK PAIN 02/03/2008  . NUMBNESS 01/29/2008  . PULMONARY NODULE 12/26/2007  . HYPERLIPIDEMIA 06/21/2007  . ANEMIA-NOS 06/21/2007  . LEUKOPENIA, CHRONIC 06/21/2007  . ANXIETY 06/21/2007  . DEPRESSION 06/21/2007  . HYPERTENSION 06/21/2007  . CEREBRAL ANEURYSM 06/21/2007  . ALLERGIC RHINITIS 06/21/2007  . IBS 06/21/2007  . Osteoarthrosis, unspecified whether generalized or localized, unspecified site 06/21/2007  . FIBROMYALGIA 06/21/2007  . CEREBROVASCULAR ACCIDENT, HX OF 06/21/2007    Voncille Lo, PT Certified Exercise Expert for the Aging Adult  09/16/18 3:43 PM Phone: [phone] Fax: [place]  [facility] [address] [place], [place], [address] Phone: [phone]   Fax:  [phone]  Name: Angela Wang MRN: [id number] Date of Birth: 1931-11-10

## 2018-09-16 NOTE — Patient Instructions (Signed)
   Copyright  VHI. All rights reserved.  HIP: Flexion / KNEE: Extension, Straight Leg Raise   Raise leg, keeping knee straight. Perform slowly. _10__ reps per set, __2_ sets per day, _7__ days per week 1-2 times a day   Copyright  VHI. All rights reserved.  Heel Slide   Bend knee and pull heel toward buttocks. Hold _3___ seconds. Return. Repeat with other knee. Repeat _10 x 2___ times. Do _1-2___ sessions per day.  http://[email or web address].exer.us/372   Copyright  VHI. All rights reserved.     Raise leg until knee is straight. _10__ reps per set, ___ sets per day, __7_ days per week  Copyright  VHI. All rights reserved.  [employer] a large can or rolled towel under leg. Straighten knee and leg. Hold __3__ seconds. Repeat with other leg. Repeat 10 x 2____ times. Do __1-2__ sessions per day.  http://gt2.exer.us/365   Copyright  VHI. All rights reserved.  Quad Set   Slowly tighten muscles on thigh of straight leg while counting out loud to _5___. Repeat with other leg. Repeat _30___ times. Do __3__ sessions per day.  http://[email or web address].exer.us/361   Copyright  VHI. All rights reserved.  Voncille Lo, PT Certified Exercise Expert for the Aging Adult  09/16/18 9:52 AM Phone: [phone] Fax: [phone]

## 2018-09-22 ENCOUNTER — Emergency Department (HOSPITAL_BASED_OUTPATIENT_CLINIC_OR_DEPARTMENT_OTHER): Payer: Medicare Other

## 2018-09-22 ENCOUNTER — Other Ambulatory Visit: Payer: Self-pay

## 2018-09-22 ENCOUNTER — Ambulatory Visit: Payer: Medicare Other | Admitting: Physical Therapy

## 2018-09-22 ENCOUNTER — Emergency Department (HOSPITAL_BASED_OUTPATIENT_CLINIC_OR_DEPARTMENT_OTHER)
Admission: EM | Admit: 2018-09-22 | Discharge: 2018-09-22 | Disposition: A | Discharge status: Home / Self Care | Payer: Medicare Other | Source: Home / Self Care | Attending: Emergency Medicine | Admitting: Emergency Medicine

## 2018-09-22 ENCOUNTER — Telehealth: Payer: Self-pay | Admitting: Physical Therapy

## 2018-09-22 ENCOUNTER — Encounter (HOSPITAL_BASED_OUTPATIENT_CLINIC_OR_DEPARTMENT_OTHER): Payer: Self-pay | Admitting: *Deleted

## 2018-09-22 DIAGNOSIS — Z7901 Long term (current) use of anticoagulants: Secondary | ICD-10-CM

## 2018-09-22 DIAGNOSIS — Z79899 Other long term (current) drug therapy: Secondary | ICD-10-CM

## 2018-09-22 DIAGNOSIS — I10 Essential (primary) hypertension: Secondary | ICD-10-CM

## 2018-09-22 DIAGNOSIS — R51 Headache: Secondary | ICD-10-CM

## 2018-09-22 DIAGNOSIS — E039 Hypothyroidism, unspecified: Secondary | ICD-10-CM

## 2018-09-22 DIAGNOSIS — M542 Cervicalgia: Secondary | ICD-10-CM | POA: Insufficient documentation

## 2018-09-22 MED ORDER — KETOROLAC TROMETHAMINE 60 MG/2ML IM SOLN
30.0000 mg | Freq: Once | INTRAMUSCULAR | Status: AC
  Administered 2018-09-22: 30 mg via INTRAMUSCULAR
  Filled 2018-09-22: qty 2

## 2018-09-22 MED ORDER — ACETAMINOPHEN-CODEINE #3 300-30 MG PO TABS
1.0000 | ORAL_TABLET | Freq: Once | ORAL | Status: DC
  Filled 2018-09-22: qty 1

## 2018-09-22 MED ORDER — METHOCARBAMOL 500 MG PO TABS: 500.0000 mg | ORAL_TABLET | Freq: Three times a day (TID) | ORAL | 0 refills | Status: AC
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
## 2018-09-22 NOTE — Telephone Encounter (Signed)
Attempted to call patient about missed visit today.  I was unable to get through or leave a message.  The phone was busy. Melvenia Needles PTA
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
## 2018-09-22 NOTE — ED Notes (Signed)
Patient requested to have an injection med for pain.  EDP informed and she stated that she will talk to the patient and hold the Tylenol #3 for now until she talk to the patient.

## 2018-09-24 ENCOUNTER — Ambulatory Visit: Payer: Medicare Other

## 2018-09-29 ENCOUNTER — Ambulatory Visit: Payer: Medicare Other | Admitting: Physical Therapy

## 2018-10-02 ENCOUNTER — Ambulatory Visit: Payer: Medicare Other | Admitting: Physical Therapy

## 2018-10-06 ENCOUNTER — Ambulatory Visit: Payer: Medicare Other | Admitting: Physical Therapy

## 2018-10-13 ENCOUNTER — Encounter: Payer: Medicare Other | Admitting: Physical Therapy

## 2018-10-16 ENCOUNTER — Encounter: Payer: Medicare Other | Admitting: Physical Therapy

## 2018-10-20 ENCOUNTER — Ambulatory Visit: Payer: Medicare Other | Admitting: Physical Therapy

## 2018-10-20 ENCOUNTER — Encounter: Payer: Medicare Other | Admitting: Physical Therapy

## 2018-10-22 ENCOUNTER — Encounter: Payer: Medicare Other | Admitting: Physical Therapy

## 2018-10-27 ENCOUNTER — Ambulatory Visit: Payer: Medicare Other | Admitting: Physical Therapy

## 2018-10-27 ENCOUNTER — Encounter: Payer: Medicare Other | Admitting: Physical Therapy

## 2018-10-30 ENCOUNTER — Ambulatory Visit: Payer: Medicare Other | Admitting: Physical Therapy

## 2018-10-30 ENCOUNTER — Encounter: Payer: Medicare Other | Admitting: Physical Therapy

## 2018-11-03 ENCOUNTER — Ambulatory Visit: Payer: Medicare Other | Attending: Physician Assistant | Admitting: Physical Therapy

## 2018-11-03 ENCOUNTER — Encounter: Payer: Medicare Other | Admitting: Physical Therapy

## 2018-11-03 ENCOUNTER — Encounter: Payer: Self-pay | Admitting: Physical Therapy

## 2018-11-03 DIAGNOSIS — M25561 Pain in right knee: Secondary | ICD-10-CM | POA: Diagnosis present

## 2018-11-03 DIAGNOSIS — M6281 Muscle weakness (generalized): Secondary | ICD-10-CM | POA: Insufficient documentation

## 2018-11-03 DIAGNOSIS — M25661 Stiffness of right knee, not elsewhere classified: Secondary | ICD-10-CM

## 2018-11-03 DIAGNOSIS — G8929 Other chronic pain: Secondary | ICD-10-CM | POA: Insufficient documentation

## 2018-11-03 DIAGNOSIS — R262 Difficulty in walking, not elsewhere classified: Secondary | ICD-10-CM | POA: Diagnosis present

## 2018-11-03 DIAGNOSIS — M25562 Pain in left knee: Secondary | ICD-10-CM | POA: Insufficient documentation

## 2018-11-03 DIAGNOSIS — M79604 Pain in right leg: Secondary | ICD-10-CM | POA: Insufficient documentation

## 2018-11-03 DIAGNOSIS — M25662 Stiffness of left knee, not elsewhere classified: Secondary | ICD-10-CM | POA: Diagnosis present
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
## 2018-11-06 ENCOUNTER — Ambulatory Visit: Payer: Medicare Other | Admitting: Physical Therapy

## 2018-11-06 ENCOUNTER — Encounter: Payer: Self-pay | Admitting: Physical Therapy

## 2018-11-06 ENCOUNTER — Encounter: Payer: Medicare Other | Admitting: Physical Therapy

## 2018-11-06 DIAGNOSIS — M6281 Muscle weakness (generalized): Secondary | ICD-10-CM

## 2018-11-06 DIAGNOSIS — M25661 Stiffness of right knee, not elsewhere classified: Secondary | ICD-10-CM

## 2018-11-06 DIAGNOSIS — R262 Difficulty in walking, not elsewhere classified: Secondary | ICD-10-CM

## 2018-11-06 DIAGNOSIS — M25562 Pain in left knee: Principal | ICD-10-CM

## 2018-11-06 DIAGNOSIS — M25662 Stiffness of left knee, not elsewhere classified: Secondary | ICD-10-CM

## 2018-11-06 DIAGNOSIS — M25561 Pain in right knee: Secondary | ICD-10-CM

## 2018-11-06 DIAGNOSIS — G8929 Other chronic pain: Secondary | ICD-10-CM

## 2018-11-06 DIAGNOSIS — M79604 Pain in right leg: Secondary | ICD-10-CM

## 2018-11-06 NOTE — Therapy (Signed)
[place], [place], [address] Phone: [phone]   Fax:  [phone]  Physical Therapy Treatment  Patient Details  Name: Angela Wang MRN: [id number] Date of Birth: 06-26-1931 Referring Provider (PT): Shilts, Keenan Bachelor PA   Encounter Date: 11/06/2018  PT End of Session - 11/06/18 1511    Visit Number  3    Number of Visits  16    Date for PT Re-Evaluation  11/11/18    Authorization Type  [employer] Medicare, KX modifier after 15  progress note after note 10    PT Start Time  1334    PT Stop Time  1430    PT Time Calculation (min)  56 min    Activity Tolerance  Patient tolerated treatment well    Behavior During Therapy  [facility] for tasks assessed/performed       Past Medical History:  Diagnosis Date  . ALLERGIC RHINITIS 06/21/2007   Qualifier: Diagnosis of  By: Wynona Luna   . Allergy    allergic rhinitis  . Anemia    nos  . ANEMIA-NOS 06/21/2007   Qualifier: Diagnosis of  By: Wynona Luna   . ANXIETY 06/21/2007   Qualifier: Diagnosis of  By: Wynona Luna   . Arthritis    osteoarthritis  . Atypical chest pain 09/2008   negative myoview  . BACK PAIN, LUMBAR 03/06/2010   Qualifier: Diagnosis of  By: Wynona Luna   . CEREBRAL ANEURYSM 06/21/2007   Qualifier: History of  By: Wynona Luna   . CEREBROVASCULAR ACCIDENT, HX OF 06/21/2007   Qualifier: Diagnosis of  By: Wynona Luna   . CHEST PAIN, ATYPICAL 09/02/2008   Qualifier: Diagnosis of  By: Wynona Luna   . Chronic sinusitis   . CONSTIPATION, CHRONIC 09/20/2009   Qualifier: Diagnosis of  By: Olevia Perches MD, Lowella Bandy   . Depression   . DEPRESSION 06/21/2007   Qualifier: Diagnosis of  By: Wynona Luna   . Diverticulosis   . DIVERTICULOSIS-COLON 06/09/2008   Qualifier: Diagnosis of  By: Chester Holstein NP, Nevin Bloodgood    . Fibromyalgia   . FIBROMYALGIA 06/21/2007   Qualifier: Diagnosis of  By: Wynona Luna   . GASTRITIS, HX OF  08/25/2008   Qualifier: Diagnosis of  By: Nelson-Smith CMA (AAMA), Dottie    . GERD 05/03/2008   Qualifier: Diagnosis of  By: Julien Girt CMA, Marliss Czar    . GERD (gastroesophageal reflux disease)   . Headache 03/15/2011  . History of solitary pulmonary nodule    right, stable by CT  . Hyperlipidemia   . HYPERLIPIDEMIA 06/21/2007   Qualifier: Diagnosis of  By: Wynona Luna   . Hypertension   . HYPERTENSION 06/21/2007   Qualifier: Diagnosis of  By: Wynona Luna   . HYPOGLYCEMIA 10/24/2010   Qualifier: Diagnosis of  By: Wynona Luna   . HYPOTHYROIDISM 07/22/2008   Qualifier: Diagnosis of  By: Wynona Luna   . IBS 06/21/2007   Qualifier: Diagnosis of  By: Wynona Luna   . IBS (irritable bowel syndrome)   . INSOMNIA, CHRONIC 07/19/2009   Qualifier: Diagnosis of  By: Wynona Luna   . LEG PAIN, RIGHT 02/01/2009   Qualifier: Diagnosis of  By: Wynona Luna   . Leukopenia    chronic  . LEUKOPENIA, CHRONIC 06/21/2007   Qualifier:  Diagnosis of  By: Wynona Luna   . NECK PAIN 02/03/2008   Qualifier: Diagnosis of  By: Wynona Luna   . NUMBNESS 01/29/2008   Qualifier: Diagnosis of  By: Loanne Drilling MD, Jacelyn Pi   . Docia Furl NOS-Unspec 06/21/2007   Centricity Description: DEGENERATIVE JOINT DISEASE, MILD Qualifier: Diagnosis of  By: Wynona Luna  Centricity Description: OSTEOARTHRITIS Qualifier: Diagnosis of  By: Wynona Luna   . PULMONARY NODULE 12/26/2007   Qualifier: Diagnosis of  By: Wynona Luna   . Routine general medical examination at a health care facility   . SCIATICA, RIGHT 05/06/2008   Qualifier: Diagnosis of  By: Nathaneil Canary, CMA, Sarah    . Stroke ([place])   . THROMBOCYTOPENIA 02/09/2009   Qualifier: Diagnosis of  By: Wynona Luna   . UNSTEADY GAIT 07/28/2009   Qualifier: Diagnosis of  By: Wynona Luna   . VENOUS INSUFFICIENCY 05/03/2008   Qualifier: Diagnosis of  By: Julien Girt CMA, Marliss Czar      Past Surgical History:  Procedure Laterality Date  .  ABDOMINAL HYSTERECTOMY    . ANEURYSM COILING    . APPENDECTOMY    . CHOLECYSTECTOMY    . JOINT REPLACEMENT     total right knee replacement  . KNEE SURGERY      There were no vitals filed for this visit.  Subjective Assessment - 11/06/18 1343    Subjective  Antiobiotic for sinus infection really helps.  has been doing the exercises.    Currently in Pain?  Yes    Pain Score  7     Pain Location  Knee    Pain Orientation  Right    Pain Descriptors / Indicators  Aching;Sore    Pain Frequency  Intermittent    Aggravating Factors   walking and moving around.    Pain Relieving Factors  gentle walking,  rest    Multiple Pain Sites  --   right heel,  8/10 long standing                      [facility] Adult PT Treatment/Exercise - 11/06/18 0001      Knee/Hip Exercises: Stretches   Active Hamstring Stretch  Both;3 reps;30 seconds      Knee/Hip Exercises: Aerobic   Nustep  L3x5' LE only      Knee/Hip Exercises: Standing   Gait Training  cane,  LE position.  avoid pivot.       Knee/Hip Exercises: Seated   Long Scientist, clinical (histocompatibility and immunogenetics)    Long Arc Quad Weight  0 lbs.    Clamshell with TheraBand  Red   10 x 2 sets single leg each   Other Seated Knee/Hip Exercises  ball squeeze with abdominals      Knee/Hip Exercises: Supine   Quad Sets  Strengthening;Both;1 set;20 reps    Quad Sets Limitations  cued technique    Heel Slides  5 reps    Bridges  Both;Strengthening;10 reps   2 reps - stopped because of severe cramp Rt HS   Bridges Limitations  cramp X 1 posterior right.     Straight Leg Raises  Strengthening;Both;10 reps;1 set    Straight Leg Raises Limitations  LT 8 reps  RT 10 x      Moist Heat Therapy   Number Minutes Moist Heat  15 Minutes    Moist Heat Location  Knee   both  PT Education - 11/06/18 1506    Education Details  gait training,  exercise form    Person(s) Educated  Patient    Methods  Explanation;Demonstration;Tactile  cues;Verbal cues    Comprehension  Verbalized understanding;Returned demonstration;Tactile cues required       PT Short Term Goals - 11/03/18 1354      PT SHORT TERM GOAL #1   Title  "Independent with initial HEP    Status  On-going      PT SHORT TERM GOAL #2   Title  "Report pain decrease at rest from  7 /10 to  4 /10 negotiating steps    Status  On-going      PT SHORT TERM GOAL #3   Title  Pt will be able to perform 5 x sts in 15 sec or less to show increased LE strengthening    Status  On-going        PT Long Term Goals - 11/03/18 1354      PT LONG TERM GOAL #1   Title  "Pt will be independent with advanced HEP.     Status  On-going      PT LONG TERM GOAL #2   Title  "Pt will improve L and R knee/hip  extensor/flexor strength to >/= 4+/5 with </= 2/10 pain to promote safety with walking/standing activities    Status  On-going      PT LONG TERM GOAL #3   Title  "FOTO will improve from 73% limitation   to  57% limitation   indicating improved functional mobility .     Status  On-going      PT LONG TERM GOAL #4   Title  Pt will be instructed and demonstrate floor to mat transfer in order to care for herself living alone with safety    Status  On-going      PT LONG TERM GOAL #5   Title  Pt will be able to negotiate steps with =/< 3/10 pain with safety and awareness of fall prevention    Status  On-going      PT LONG TERM GOAL #6   Title  "Pt will improve her L/R knee flexion to  </= 115 degrees and extension to </= 5 degrees with </= 2/10 pain for a more functional and efficient gait pattern    Status  Partially Met   motion is improved, pain still high           Plan - 11/06/18 1511    Clinical Impression Statement  Exercises required minor cues for technique.  Patient has her current HEP memorized.  Cramp in Hamstrings X 1 with bridge.     Pain addressed with stretching and moist heat.    PT Next Visit Plan  LE strengthening and beginner balance work    PT  Home Exercise Plan  quad set, heel slide , SLR  Keep these exercises present for ongoing notes    Consulted and Agree with Plan of Care  Patient       Patient will benefit from skilled therapeutic intervention in order to improve the following deficits and impairments:     Visit Diagnosis: Chronic pain of left knee  Chronic pain of right knee  Muscle weakness (generalized)  Stiffness of left knee, not elsewhere classified  Stiffness of right knee, not elsewhere classified  Difficulty in walking, not elsewhere classified  Acute pain of right knee  Pain in right leg  Joint stiffness of right lower  leg     Problem List Patient Active Problem List   Diagnosis Date Noted  . Cerebral embolism with cerebral infarction ([place]) 07/10/2013  . Headache(784.0) 03/15/2011  . HYPOGLYCEMIA 10/24/2010  . BACK PAIN, LUMBAR 03/06/2010  . CONSTIPATION, CHRONIC 09/20/2009  . SINUSITIS, CHRONIC 08/16/2009  . UNSTEADY GAIT 07/28/2009  . INSOMNIA, CHRONIC 07/19/2009  . THROMBOCYTOPENIA 02/09/2009  . LEG PAIN, RIGHT 02/01/2009  . Other abnormal glucose 02/01/2009  . CHEST PAIN, ATYPICAL 09/02/2008  . GASTRITIS, HX OF 08/25/2008  . HYPOTHYROIDISM 07/22/2008  . DIVERTICULOSIS-COLON 06/09/2008  . Sciatica of left side 05/06/2008  . VENOUS INSUFFICIENCY 05/03/2008  . GERD 05/03/2008  . NECK PAIN 02/03/2008  . NUMBNESS 01/29/2008  . PULMONARY NODULE 12/26/2007  . HYPERLIPIDEMIA 06/21/2007  . ANEMIA-NOS 06/21/2007  . LEUKOPENIA, CHRONIC 06/21/2007  . ANXIETY 06/21/2007  . DEPRESSION 06/21/2007  . HYPERTENSION 06/21/2007  . CEREBRAL ANEURYSM 06/21/2007  . ALLERGIC RHINITIS 06/21/2007  . IBS 06/21/2007  . Osteoarthrosis, unspecified whether generalized or localized, unspecified site 06/21/2007  . FIBROMYALGIA 06/21/2007  . CEREBROVASCULAR ACCIDENT, HX OF 06/21/2007    Simrit Gohlke  PTA 11/06/2018, 3:14 PM  [facility] [address] [place], [place], [address] Phone: [phone]   Fax:  [phone]  Name: ASHLYE OVIEDO MRN: [id number] Date of Birth: [birth year]

## 2018-11-14 ENCOUNTER — Encounter

## 2018-11-17 ENCOUNTER — Ambulatory Visit: Payer: Medicare Other | Admitting: Physical Therapy

## 2018-11-17 ENCOUNTER — Encounter: Payer: Self-pay | Admitting: Physical Therapy

## 2018-11-17 DIAGNOSIS — M25562 Pain in left knee: Principal | ICD-10-CM

## 2018-11-17 DIAGNOSIS — G8929 Other chronic pain: Secondary | ICD-10-CM

## 2018-11-17 DIAGNOSIS — M25661 Stiffness of right knee, not elsewhere classified: Secondary | ICD-10-CM

## 2018-11-17 DIAGNOSIS — M6281 Muscle weakness (generalized): Secondary | ICD-10-CM

## 2018-11-17 DIAGNOSIS — R262 Difficulty in walking, not elsewhere classified: Secondary | ICD-10-CM

## 2018-11-17 DIAGNOSIS — M25561 Pain in right knee: Secondary | ICD-10-CM

## 2018-11-17 DIAGNOSIS — M25662 Stiffness of left knee, not elsewhere classified: Secondary | ICD-10-CM
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
## 2018-11-21 ENCOUNTER — Encounter: Payer: Self-pay | Admitting: Physical Therapy

## 2018-11-21 ENCOUNTER — Ambulatory Visit: Payer: Medicare Other | Admitting: Physical Therapy

## 2018-11-21 DIAGNOSIS — G8929 Other chronic pain: Secondary | ICD-10-CM

## 2018-11-21 DIAGNOSIS — M6281 Muscle weakness (generalized): Secondary | ICD-10-CM

## 2018-11-21 DIAGNOSIS — M25661 Stiffness of right knee, not elsewhere classified: Secondary | ICD-10-CM

## 2018-11-21 DIAGNOSIS — M25562 Pain in left knee: Secondary | ICD-10-CM | POA: Diagnosis not present

## 2018-11-21 DIAGNOSIS — R262 Difficulty in walking, not elsewhere classified: Secondary | ICD-10-CM

## 2018-11-21 DIAGNOSIS — M25561 Pain in right knee: Secondary | ICD-10-CM

## 2018-11-21 DIAGNOSIS — M25662 Stiffness of left knee, not elsewhere classified: Secondary | ICD-10-CM

## 2018-11-21 NOTE — Therapy (Signed)
[place], [place], [address] Phone: [phone]   Fax:  [phone]  Physical Therapy Treatment  Patient Details  Name: Angela Wang MRN: [id number] Date of Birth: December 22, 1930 Referring Provider (PT): Shilts, Keenan Bachelor PA   Encounter Date: 11/21/2018  PT End of Session - 11/21/18 0755    Visit Number  5    Number of Visits  20    Date for PT Re-Evaluation  01/12/19    Authorization Type  [employer] Medicare, KX modifier after 15  progress note after note 10    PT Start Time  0755    PT Stop Time  0854    PT Time Calculation (min)  59 min    Activity Tolerance  Patient tolerated treatment well       Past Medical History:  Diagnosis Date  . ALLERGIC RHINITIS 06/21/2007   Qualifier: Diagnosis of  By: Wynona Luna   . Allergy    allergic rhinitis  . Anemia    nos  . ANEMIA-NOS 06/21/2007   Qualifier: Diagnosis of  By: Wynona Luna   . ANXIETY 06/21/2007   Qualifier: Diagnosis of  By: Wynona Luna   . Arthritis    osteoarthritis  . Atypical chest pain 09/2008   negative myoview  . BACK PAIN, LUMBAR 03/06/2010   Qualifier: Diagnosis of  By: Wynona Luna   . CEREBRAL ANEURYSM 06/21/2007   Qualifier: History of  By: Wynona Luna   . CEREBROVASCULAR ACCIDENT, HX OF 06/21/2007   Qualifier: Diagnosis of  By: Wynona Luna   . CHEST PAIN, ATYPICAL 09/02/2008   Qualifier: Diagnosis of  By: Wynona Luna   . Chronic sinusitis   . CONSTIPATION, CHRONIC 09/20/2009   Qualifier: Diagnosis of  By: Olevia Perches MD, Lowella Bandy   . Depression   . DEPRESSION 06/21/2007   Qualifier: Diagnosis of  By: Wynona Luna   . Diverticulosis   . DIVERTICULOSIS-COLON 06/09/2008   Qualifier: Diagnosis of  By: Chester Holstein NP, Nevin Bloodgood    . Fibromyalgia   . FIBROMYALGIA 06/21/2007   Qualifier: Diagnosis of  By: Wynona Luna   . GASTRITIS, HX OF 08/25/2008   Qualifier: Diagnosis of  By: Nelson-Smith CMA  (AAMA), Dottie    . GERD 05/03/2008   Qualifier: Diagnosis of  By: Julien Girt CMA, Marliss Czar    . GERD (gastroesophageal reflux disease)   . Headache 03/15/2011  . History of solitary pulmonary nodule    right, stable by CT  . Hyperlipidemia   . HYPERLIPIDEMIA 06/21/2007   Qualifier: Diagnosis of  By: Wynona Luna   . Hypertension   . HYPERTENSION 06/21/2007   Qualifier: Diagnosis of  By: Wynona Luna   . HYPOGLYCEMIA 10/24/2010   Qualifier: Diagnosis of  By: Wynona Luna   . HYPOTHYROIDISM 07/22/2008   Qualifier: Diagnosis of  By: Wynona Luna   . IBS 06/21/2007   Qualifier: Diagnosis of  By: Wynona Luna   . IBS (irritable bowel syndrome)   . INSOMNIA, CHRONIC 07/19/2009   Qualifier: Diagnosis of  By: Wynona Luna   . LEG PAIN, RIGHT 02/01/2009   Qualifier: Diagnosis of  By: Wynona Luna   . Leukopenia    chronic  . LEUKOPENIA, CHRONIC 06/21/2007   Qualifier: Diagnosis of  By: Wynona Luna   .  NECK PAIN 02/03/2008   Qualifier: Diagnosis of  By: Wynona Luna   . NUMBNESS 01/29/2008   Qualifier: Diagnosis of  By: Loanne Drilling MD, Jacelyn Pi   . Docia Furl NOS-Unspec 06/21/2007   Centricity Description: DEGENERATIVE JOINT DISEASE, MILD Qualifier: Diagnosis of  By: Wynona Luna  Centricity Description: OSTEOARTHRITIS Qualifier: Diagnosis of  By: Wynona Luna   . PULMONARY NODULE 12/26/2007   Qualifier: Diagnosis of  By: Wynona Luna   . Routine general medical examination at a health care facility   . SCIATICA, RIGHT 05/06/2008   Qualifier: Diagnosis of  By: Nathaneil Canary, CMA, Sarah    . Stroke ([place])   . THROMBOCYTOPENIA 02/09/2009   Qualifier: Diagnosis of  By: Wynona Luna   . UNSTEADY GAIT 07/28/2009   Qualifier: Diagnosis of  By: Wynona Luna   . VENOUS INSUFFICIENCY 05/03/2008   Qualifier: Diagnosis of  By: Julien Girt CMA, Marliss Czar      Past Surgical History:  Procedure Laterality Date  . ABDOMINAL HYSTERECTOMY    . ANEURYSM COILING    .  APPENDECTOMY    . CHOLECYSTECTOMY    . JOINT REPLACEMENT     total right knee replacement  . KNEE SURGERY      There were no vitals filed for this visit.  Subjective Assessment - 11/21/18 0756    Subjective  Angela Wang reports her Rt knee is giving her more trouble than the Lt.  She states she is doing her HEP    Patient Stated Goals  Goals are to walk better,  be able to get out of my chair, be able to get up from the floor is I need to    Currently in Pain?  Yes    Pain Score  7     Pain Location  Knee    Pain Orientation  Right    Pain Descriptors / Indicators  Aching;Sore    Pain Type  Chronic pain    Pain Onset  More than a month ago    Pain Frequency  Constant    Aggravating Factors   sit to stand, walking    Pain Relieving Factors  heat         [facility] PT Assessment - 11/21/18 0001      Assessment   Medical Diagnosis  Left Knee OA, bil knee pain     Referring Provider (PT)  Shilts, Keenan Bachelor PA      Strength   Right Hip ABduction  2+/5                   [facility] Adult PT Treatment/Exercise - 11/21/18 0001      Neuro Re-ed    Neuro Re-ed Details   5 x length of counter, side stepping, without UE.  Toe taps on 4" step alternating next to counter with one hand assist      Exercises   Exercises  Knee/Hip      Knee/Hip Exercises: Aerobic   Nustep  L3x7 min L/UE       Knee/Hip Exercises: Standing   Wall Squat  2 sets;10 reps   VC for form     Knee/Hip Exercises: Seated   Long Arc Quad  Strengthening;Both;2 sets;10 reps    Long Arc Quad Weight  5 lbs.    Other Seated Knee/Hip Exercises  3x10 hip extension with feet on bolster - modifed bridge       Knee/Hip Exercises: Supine  Heel Slides  Strengthening;Both;15 reps    Other Supine Knee/Hip Exercises  15 reps clam with green band       Knee/Hip Exercises: Sidelying   Clams  3x10 reverse clams each side      Modalities   Modalities  Moist Heat      Moist Heat Therapy   Number Minutes Moist Heat   15 Minutes    Moist Heat Location  Knee   bilat               PT Short Term Goals - 11/21/18 4034      PT SHORT TERM GOAL #1   Title  "Independent with initial HEP    Status  Achieved      PT SHORT TERM GOAL #2   Title  "Report pain decrease at rest from  7 /10 to  4 /10 negotiating steps    Status  On-going      PT SHORT TERM GOAL #3   Title  Pt will be able to perform 5 x sts in 15 sec or less to show increased LE strengthening    Baseline  > 15 sec from mat table    Status  On-going        PT Long Term Goals - 11/21/18 0829      PT LONG TERM GOAL #1   Title  "Pt will be independent with advanced HEP.     Status  On-going      PT LONG TERM GOAL #2   Title  "Pt will improve L and R knee/hip  extensor/flexor strength to >/= 4+/5 with </= 2/10 pain to promote safety with walking/standing activities    Status  On-going      PT LONG TERM GOAL #3   Title  "FOTO will improve from 73% limitation   to  57% limitation   indicating improved functional mobility .     Status  On-going      PT LONG TERM GOAL #4   Title  Pt will be instructed and demonstrate floor to mat transfer in order to care for herself living alone with safety    Status  On-going      PT LONG TERM GOAL #5   Title  Pt will be able to negotiate steps with =/< 3/10 pain with safety and awareness of fall prevention    Status  On-going      PT LONG TERM GOAL #6   Title  "Pt will improve her L/R knee flexion to  </= 115 degrees and extension to </= 5 degrees with </= 2/10 pain for a more functional and efficient gait pattern    Status  Partially Met            Plan - 11/21/18 0831    Clinical Impression Statement  Angela Wang is very weak in her hips causing decreased stability proximally. She has Rt knee adduction with standing and functional movements. Slow progress to her goals due to level of pain, age and medical comorbidities.     Rehab Potential  Good    PT Frequency  2x / week    PT Duration   8 weeks    PT Treatment/Interventions  ADLs/Self Care Home Management;Cryotherapy;Electrical Stimulation;Iontophoresis 27m/ml Dexamethasone;Moist Heat;Ultrasound;Gait training;Stair training;Functional mobility training;Therapeutic activities;Therapeutic exercise;Balance training;Neuromuscular re-education;Patient/family education;Manual techniques;Passive range of motion;Scar mobilization;Dry needling;Taping    PT Next Visit Plan  LE strengthening and beginner balance work    Consulted and Agree with Plan of Care  Patient  Patient will benefit from skilled therapeutic intervention in order to improve the following deficits and impairments:  Abnormal gait, Difficulty walking, Obesity, Pain, Decreased mobility, Decreased range of motion, Decreased strength, Increased fascial restricitons, Postural dysfunction, Improper body mechanics  Visit Diagnosis: Chronic pain of left knee  Chronic pain of right knee  Muscle weakness (generalized)  Stiffness of left knee, not elsewhere classified  Stiffness of right knee, not elsewhere classified  Difficulty in walking, not elsewhere classified     Problem List Patient Active Problem List   Diagnosis Date Noted  . Cerebral embolism with cerebral infarction ([place]) 07/10/2013  . Headache(784.0) 03/15/2011  . HYPOGLYCEMIA 10/24/2010  . BACK PAIN, LUMBAR 03/06/2010  . CONSTIPATION, CHRONIC 09/20/2009  . SINUSITIS, CHRONIC 08/16/2009  . UNSTEADY GAIT 07/28/2009  . INSOMNIA, CHRONIC 07/19/2009  . THROMBOCYTOPENIA 02/09/2009  . LEG PAIN, RIGHT 02/01/2009  . Other abnormal glucose 02/01/2009  . CHEST PAIN, ATYPICAL 09/02/2008  . GASTRITIS, HX OF 08/25/2008  . HYPOTHYROIDISM 07/22/2008  . DIVERTICULOSIS-COLON 06/09/2008  . Sciatica of left side 05/06/2008  . VENOUS INSUFFICIENCY 05/03/2008  . GERD 05/03/2008  . NECK PAIN 02/03/2008  . NUMBNESS 01/29/2008  . PULMONARY NODULE 12/26/2007  . HYPERLIPIDEMIA 06/21/2007  . ANEMIA-NOS  06/21/2007  . LEUKOPENIA, CHRONIC 06/21/2007  . ANXIETY 06/21/2007  . DEPRESSION 06/21/2007  . HYPERTENSION 06/21/2007  . CEREBRAL ANEURYSM 06/21/2007  . ALLERGIC RHINITIS 06/21/2007  . IBS 06/21/2007  . Osteoarthrosis, unspecified whether generalized or localized, unspecified site 06/21/2007  . FIBROMYALGIA 06/21/2007  . CEREBROVASCULAR ACCIDENT, HX OF 06/21/2007    Jeral Pinch PT  11/21/2018, 8:49 AM  [facility] [address] [place], [place], [address] Phone: [phone]   Fax:  [phone]  Name: Angela Wang MRN: [id number] Date of Birth: [birth year]

## 2018-11-24 ENCOUNTER — Encounter: Payer: Self-pay | Admitting: Physical Therapy

## 2018-11-24 ENCOUNTER — Ambulatory Visit: Payer: Medicare Other | Admitting: Physical Therapy

## 2018-11-24 DIAGNOSIS — M25661 Stiffness of right knee, not elsewhere classified: Secondary | ICD-10-CM

## 2018-11-24 DIAGNOSIS — M6281 Muscle weakness (generalized): Secondary | ICD-10-CM

## 2018-11-24 DIAGNOSIS — M25662 Stiffness of left knee, not elsewhere classified: Secondary | ICD-10-CM

## 2018-11-24 DIAGNOSIS — M25561 Pain in right knee: Secondary | ICD-10-CM

## 2018-11-24 DIAGNOSIS — M25562 Pain in left knee: Secondary | ICD-10-CM | POA: Diagnosis not present

## 2018-11-24 DIAGNOSIS — R262 Difficulty in walking, not elsewhere classified: Secondary | ICD-10-CM

## 2018-11-24 DIAGNOSIS — G8929 Other chronic pain: Secondary | ICD-10-CM

## 2018-11-24 NOTE — Therapy (Signed)
[place], [place], [address] Phone: [phone]   Fax:  [phone]  Physical Therapy Treatment  Patient Details  Name: Angela Wang MRN: [id number] Date of Birth: 05/20/1931 Referring Provider (PT): Shilts, Keenan Bachelor PA   Encounter Date: 11/24/2018  PT End of Session - 11/24/18 1330    Visit Number  6    Number of Visits  20    Date for PT Re-Evaluation  01/12/19    Authorization Type  [employer] Medicare, KX modifier after 15  progress note after note 10    PT Start Time  1330    PT Stop Time  1427    PT Time Calculation (min)  57 min    Activity Tolerance  Patient tolerated treatment well       Past Medical History:  Diagnosis Date  . ALLERGIC RHINITIS 06/21/2007   Qualifier: Diagnosis of  By: Wynona Luna   . Allergy    allergic rhinitis  . Anemia    nos  . ANEMIA-NOS 06/21/2007   Qualifier: Diagnosis of  By: Wynona Luna   . ANXIETY 06/21/2007   Qualifier: Diagnosis of  By: Wynona Luna   . Arthritis    osteoarthritis  . Atypical chest pain 09/2008   negative myoview  . BACK PAIN, LUMBAR 03/06/2010   Qualifier: Diagnosis of  By: Wynona Luna   . CEREBRAL ANEURYSM 06/21/2007   Qualifier: History of  By: Wynona Luna   . CEREBROVASCULAR ACCIDENT, HX OF 06/21/2007   Qualifier: Diagnosis of  By: Wynona Luna   . CHEST PAIN, ATYPICAL 09/02/2008   Qualifier: Diagnosis of  By: Wynona Luna   . Chronic sinusitis   . CONSTIPATION, CHRONIC 09/20/2009   Qualifier: Diagnosis of  By: Olevia Perches MD, Lowella Bandy   . Depression   . DEPRESSION 06/21/2007   Qualifier: Diagnosis of  By: Wynona Luna   . Diverticulosis   . DIVERTICULOSIS-COLON 06/09/2008   Qualifier: Diagnosis of  By: Chester Holstein NP, Nevin Bloodgood    . Fibromyalgia   . FIBROMYALGIA 06/21/2007   Qualifier: Diagnosis of  By: Wynona Luna   . GASTRITIS, HX OF 08/25/2008   Qualifier: Diagnosis of  By: Nelson-Smith CMA  (AAMA), Dottie    . GERD 05/03/2008   Qualifier: Diagnosis of  By: Julien Girt CMA, Marliss Czar    . GERD (gastroesophageal reflux disease)   . Headache 03/15/2011  . History of solitary pulmonary nodule    right, stable by CT  . Hyperlipidemia   . HYPERLIPIDEMIA 06/21/2007   Qualifier: Diagnosis of  By: Wynona Luna   . Hypertension   . HYPERTENSION 06/21/2007   Qualifier: Diagnosis of  By: Wynona Luna   . HYPOGLYCEMIA 10/24/2010   Qualifier: Diagnosis of  By: Wynona Luna   . HYPOTHYROIDISM 07/22/2008   Qualifier: Diagnosis of  By: Wynona Luna   . IBS 06/21/2007   Qualifier: Diagnosis of  By: Wynona Luna   . IBS (irritable bowel syndrome)   . INSOMNIA, CHRONIC 07/19/2009   Qualifier: Diagnosis of  By: Wynona Luna   . LEG PAIN, RIGHT 02/01/2009   Qualifier: Diagnosis of  By: Wynona Luna   . Leukopenia    chronic  . LEUKOPENIA, CHRONIC 06/21/2007   Qualifier: Diagnosis of  By: Wynona Luna   .  NECK PAIN 02/03/2008   Qualifier: Diagnosis of  By: Wynona Luna   . NUMBNESS 01/29/2008   Qualifier: Diagnosis of  By: Loanne Drilling MD, Jacelyn Pi   . Docia Furl NOS-Unspec 06/21/2007   Centricity Description: DEGENERATIVE JOINT DISEASE, MILD Qualifier: Diagnosis of  By: Wynona Luna  Centricity Description: OSTEOARTHRITIS Qualifier: Diagnosis of  By: Wynona Luna   . PULMONARY NODULE 12/26/2007   Qualifier: Diagnosis of  By: Wynona Luna   . Routine general medical examination at a health care facility   . SCIATICA, RIGHT 05/06/2008   Qualifier: Diagnosis of  By: Nathaneil Canary, CMA, Sarah    . Stroke ([place])   . THROMBOCYTOPENIA 02/09/2009   Qualifier: Diagnosis of  By: Wynona Luna   . UNSTEADY GAIT 07/28/2009   Qualifier: Diagnosis of  By: Wynona Luna   . VENOUS INSUFFICIENCY 05/03/2008   Qualifier: Diagnosis of  By: Julien Girt CMA, Marliss Czar      Past Surgical History:  Procedure Laterality Date  . ABDOMINAL HYSTERECTOMY    . ANEURYSM COILING    .  APPENDECTOMY    . CHOLECYSTECTOMY    . JOINT REPLACEMENT     total right knee replacement  . KNEE SURGERY      There were no vitals filed for this visit.  Subjective Assessment - 11/24/18 1331    Subjective  Pt reports she isn't hurting like she usually hurts today.  was sore for about a day after the last session and then the muscles settled down.         [facility] PT Assessment - 11/24/18 0001      Assessment   Medical Diagnosis  Left Knee OA, bil knee pain     Referring Provider (PT)  Shilts, Keenan Bachelor PA      Sit to Stand   Comments  36 sec to perform 5 reps                   [facility] Adult PT Treatment/Exercise - 11/24/18 0001      Exercises   Exercises  Knee/Hip      Knee/Hip Exercises: Stretches   Active Hamstring Stretch  Both;30 seconds   supine with strap and knee presses   Passive Hamstring Stretch  Both;2 reps;30 seconds   seated with FWD lean   Other Knee/Hip Stretches  leg lengtheners to relax HS cramps      Knee/Hip Exercises: Aerobic   Nustep  L4x7 min L/UE       Knee/Hip Exercises: Machines for Strengthening   Cybex Knee Extension  3x10, 10# attempted 15 - to hard      Knee/Hip Exercises: Supine   Other Supine Knee/Hip Exercises  10 reps clams, blue band, bilat, then single , last 10 bilat again    Other Supine Knee/Hip Exercises  isometric hip flex 10x5sec same side and opposite      Modalities   Modalities  Moist Heat      Moist Heat Therapy   Number Minutes Moist Heat  15 Minutes    Moist Heat Location  Knee   bilat and Rt hamstring              PT Short Term Goals - 11/24/18 1337      PT SHORT TERM GOAL #1   Title  "Independent with initial HEP    Status  Achieved      PT SHORT TERM GOAL #2   Title  "  Report pain decrease at rest from  7 /10 to  4 /10 negotiating steps    Baseline  pain 7-8/10 stepping down, not as bad going down.     Status  On-going      PT SHORT TERM GOAL #3   Title  Pt will be able to perform  5 x sts in 15 sec or less to show increased LE strengthening    Baseline  36 sec from chair with intermittent UE assist    Status  On-going        PT Long Term Goals - 11/24/18 1343      PT LONG TERM GOAL #1   Title  "Pt will be independent with advanced HEP.     Status  On-going      PT LONG TERM GOAL #2   Title  "Pt will improve L and R knee/hip  extensor/flexor strength to >/= 4+/5 with </= 2/10 pain to promote safety with walking/standing activities    Status  On-going      PT LONG TERM GOAL #3   Title  "FOTO will improve from 73% limitation   to  57% limitation   indicating improved functional mobility .     Status  On-going      PT LONG TERM GOAL #4   Title  Pt will be instructed and demonstrate floor to mat transfer in order to care for herself living alone with safety    Status  On-going      PT LONG TERM GOAL #5   Title  Pt will be able to negotiate steps with =/< 3/10 pain with safety and awareness of fall prevention    Status  On-going      PT LONG TERM GOAL #6   Title  "Pt will improve her L/R knee flexion to  </= 115 degrees and extension to </= 5 degrees with </= 2/10 pain for a more functional and efficient gait pattern    Status  On-going            Plan - 11/24/18 1513    Clinical Impression Statement  Chiffon is toleating higher level strengthening work with the expected soreness.  She recovers with in 24 hrs.  Her gait is less antalgic,  She does still have LE weakness and some balance issues and would benefit from continued therapy to address this.     Rehab Potential  Good    PT Frequency  2x / week    PT Duration  8 weeks    PT Treatment/Interventions  ADLs/Self Care Home Management;Cryotherapy;Electrical Stimulation;Iontophoresis 4mg /ml Dexamethasone;Moist Heat;Ultrasound;Gait training;Stair training;Functional mobility training;Therapeutic activities;Therapeutic exercise;Balance training;Neuromuscular re-education;Patient/family education;Manual  techniques;Passive range of motion;Scar mobilization;Dry needling;Taping    PT Next Visit Plan  LE strengthening and balance work    Consulted and Agree with Plan of Care  Patient       Patient will benefit from skilled therapeutic intervention in order to improve the following deficits and impairments:  Abnormal gait, Difficulty walking, Obesity, Pain, Decreased mobility, Decreased range of motion, Decreased strength, Increased fascial restricitons, Postural dysfunction, Improper body mechanics  Visit Diagnosis: Chronic pain of left knee  Chronic pain of right knee  Muscle weakness (generalized)  Stiffness of left knee, not elsewhere classified  Stiffness of right knee, not elsewhere classified  Difficulty in walking, not elsewhere classified     Problem List Patient Active Problem List   Diagnosis Date Noted  . Cerebral embolism with cerebral infarction ([place]) 07/10/2013  .  Headache(784.0) 03/15/2011  . HYPOGLYCEMIA 10/24/2010  . BACK PAIN, LUMBAR 03/06/2010  . CONSTIPATION, CHRONIC 09/20/2009  . SINUSITIS, CHRONIC 08/16/2009  . UNSTEADY GAIT 07/28/2009  . INSOMNIA, CHRONIC 07/19/2009  . THROMBOCYTOPENIA 02/09/2009  . LEG PAIN, RIGHT 02/01/2009  . Other abnormal glucose 02/01/2009  . CHEST PAIN, ATYPICAL 09/02/2008  . GASTRITIS, HX OF 08/25/2008  . HYPOTHYROIDISM 07/22/2008  . DIVERTICULOSIS-COLON 06/09/2008  . Sciatica of left side 05/06/2008  . VENOUS INSUFFICIENCY 05/03/2008  . GERD 05/03/2008  . NECK PAIN 02/03/2008  . NUMBNESS 01/29/2008  . PULMONARY NODULE 12/26/2007  . HYPERLIPIDEMIA 06/21/2007  . ANEMIA-NOS 06/21/2007  . LEUKOPENIA, CHRONIC 06/21/2007  . ANXIETY 06/21/2007  . DEPRESSION 06/21/2007  . HYPERTENSION 06/21/2007  . CEREBRAL ANEURYSM 06/21/2007  . ALLERGIC RHINITIS 06/21/2007  . IBS 06/21/2007  . Osteoarthrosis, unspecified whether generalized or localized, unspecified site 06/21/2007  . FIBROMYALGIA 06/21/2007  . CEREBROVASCULAR  ACCIDENT, HX OF 06/21/2007    Manuela Schwartz Bethann Qualley PT  11/24/2018, 3:14 PM  [facility] [address] [place], [place], [address] Phone: [phone]   Fax:  [phone]  Name: CHANELLE HODSDON MRN: [id number] Date of Birth: [birth year]

## 2018-11-27 ENCOUNTER — Ambulatory Visit: Payer: Medicare Other | Attending: Physician Assistant | Admitting: Physical Therapy

## 2018-11-27 ENCOUNTER — Encounter: Payer: Self-pay | Admitting: Physical Therapy

## 2018-11-27 DIAGNOSIS — M25662 Stiffness of left knee, not elsewhere classified: Secondary | ICD-10-CM | POA: Insufficient documentation

## 2018-11-27 DIAGNOSIS — M25561 Pain in right knee: Secondary | ICD-10-CM | POA: Insufficient documentation

## 2018-11-27 DIAGNOSIS — M25661 Stiffness of right knee, not elsewhere classified: Secondary | ICD-10-CM | POA: Insufficient documentation

## 2018-11-27 DIAGNOSIS — M79604 Pain in right leg: Secondary | ICD-10-CM | POA: Insufficient documentation

## 2018-11-27 DIAGNOSIS — G8929 Other chronic pain: Secondary | ICD-10-CM | POA: Insufficient documentation

## 2018-11-27 DIAGNOSIS — M6281 Muscle weakness (generalized): Secondary | ICD-10-CM | POA: Diagnosis present

## 2018-11-27 DIAGNOSIS — R262 Difficulty in walking, not elsewhere classified: Secondary | ICD-10-CM

## 2018-11-27 DIAGNOSIS — M25562 Pain in left knee: Secondary | ICD-10-CM | POA: Diagnosis not present

## 2018-11-27 NOTE — Therapy (Signed)
[place], [place], [address] Phone: [phone]   Fax:  [phone]  Physical Therapy Treatment  Patient Details  Name: Angela Wang MRN: [id number] Date of Birth: 18-Apr-1931 Referring Provider (PT): Shilts, Keenan Bachelor PA   Encounter Date: 11/27/2018  PT End of Session - 11/27/18 1113    Visit Number  7    Number of Visits  20    Date for PT Re-Evaluation  01/12/19    Authorization Type  [employer] Medicare, KX modifier after 15  progress note after note 10    PT Start Time  1113    PT Stop Time  1210    PT Time Calculation (min)  57 min    Activity Tolerance  Patient tolerated treatment well       Past Medical History:  Diagnosis Date  . ALLERGIC RHINITIS 06/21/2007   Qualifier: Diagnosis of  By: Wynona Luna   . Allergy    allergic rhinitis  . Anemia    nos  . ANEMIA-NOS 06/21/2007   Qualifier: Diagnosis of  By: Wynona Luna   . ANXIETY 06/21/2007   Qualifier: Diagnosis of  By: Wynona Luna   . Arthritis    osteoarthritis  . Atypical chest pain 09/2008   negative myoview  . BACK PAIN, LUMBAR 03/06/2010   Qualifier: Diagnosis of  By: Wynona Luna   . CEREBRAL ANEURYSM 06/21/2007   Qualifier: History of  By: Wynona Luna   . CEREBROVASCULAR ACCIDENT, HX OF 06/21/2007   Qualifier: Diagnosis of  By: Wynona Luna   . CHEST PAIN, ATYPICAL 09/02/2008   Qualifier: Diagnosis of  By: Wynona Luna   . Chronic sinusitis   . CONSTIPATION, CHRONIC 09/20/2009   Qualifier: Diagnosis of  By: Olevia Perches MD, Lowella Bandy   . Depression   . DEPRESSION 06/21/2007   Qualifier: Diagnosis of  By: Wynona Luna   . Diverticulosis   . DIVERTICULOSIS-COLON 06/09/2008   Qualifier: Diagnosis of  By: Chester Holstein NP, Nevin Bloodgood    . Fibromyalgia   . FIBROMYALGIA 06/21/2007   Qualifier: Diagnosis of  By: Wynona Luna   . GASTRITIS, HX OF 08/25/2008   Qualifier: Diagnosis of  By: Nelson-Smith CMA  (AAMA), Dottie    . GERD 05/03/2008   Qualifier: Diagnosis of  By: Julien Girt CMA, Marliss Czar    . GERD (gastroesophageal reflux disease)   . Headache 03/15/2011  . History of solitary pulmonary nodule    right, stable by CT  . Hyperlipidemia   . HYPERLIPIDEMIA 06/21/2007   Qualifier: Diagnosis of  By: Wynona Luna   . Hypertension   . HYPERTENSION 06/21/2007   Qualifier: Diagnosis of  By: Wynona Luna   . HYPOGLYCEMIA 10/24/2010   Qualifier: Diagnosis of  By: Wynona Luna   . HYPOTHYROIDISM 07/22/2008   Qualifier: Diagnosis of  By: Wynona Luna   . IBS 06/21/2007   Qualifier: Diagnosis of  By: Wynona Luna   . IBS (irritable bowel syndrome)   . INSOMNIA, CHRONIC 07/19/2009   Qualifier: Diagnosis of  By: Wynona Luna   . LEG PAIN, RIGHT 02/01/2009   Qualifier: Diagnosis of  By: Wynona Luna   . Leukopenia    chronic  . LEUKOPENIA, CHRONIC 06/21/2007   Qualifier: Diagnosis of  By: Wynona Luna   .  NECK PAIN 02/03/2008   Qualifier: Diagnosis of  By: Wynona Luna   . NUMBNESS 01/29/2008   Qualifier: Diagnosis of  By: Loanne Drilling MD, Jacelyn Pi   . Docia Furl NOS-Unspec 06/21/2007   Centricity Description: DEGENERATIVE JOINT DISEASE, MILD Qualifier: Diagnosis of  By: Wynona Luna  Centricity Description: OSTEOARTHRITIS Qualifier: Diagnosis of  By: Wynona Luna   . PULMONARY NODULE 12/26/2007   Qualifier: Diagnosis of  By: Wynona Luna   . Routine general medical examination at a health care facility   . SCIATICA, RIGHT 05/06/2008   Qualifier: Diagnosis of  By: Nathaneil Canary, CMA, Sarah    . Stroke ([place])   . THROMBOCYTOPENIA 02/09/2009   Qualifier: Diagnosis of  By: Wynona Luna   . UNSTEADY GAIT 07/28/2009   Qualifier: Diagnosis of  By: Wynona Luna   . VENOUS INSUFFICIENCY 05/03/2008   Qualifier: Diagnosis of  By: Julien Girt CMA, Marliss Czar      Past Surgical History:  Procedure Laterality Date  . ABDOMINAL HYSTERECTOMY    . ANEURYSM COILING    .  APPENDECTOMY    . CHOLECYSTECTOMY    . JOINT REPLACEMENT     total right knee replacement  . KNEE SURGERY      There were no vitals filed for this visit.  Subjective Assessment - 11/27/18 1114    Subjective  Pt reports she is more sore today in her knees, may be from the weather - cold    Patient Stated Goals  Goals are to walk better,  be able to get out of my chair, be able to get up from the floor is I need to    Currently in Pain?  Yes    Pain Score  8     Pain Location  Knee    Pain Orientation  Right    Pain Descriptors / Indicators  Aching    Pain Type  Chronic pain    Pain Onset  More than a month ago    Pain Frequency  Constant    Aggravating Factors   weight bearing and transfering to stand    Pain Relieving Factors  heat                       [facility] Adult PT Treatment/Exercise - 11/27/18 0001      Exercises   Exercises  Knee/Hip      Knee/Hip Exercises: Stretches   Passive Hamstring Stretch  Both;2 reps;30 seconds   seated     Knee/Hip Exercises: Aerobic   Nustep  L5x7 min L/UE       Knee/Hip Exercises: Machines for Strengthening   Cybex Knee Extension  3x10, 10#, pt not able to get full ROM today.      Cybex Leg Press  3x10 45# assist to get legs up on to machine    Hip Cybex  standing abduction and extension with 1 plate to start ,20 reps      Knee/Hip Exercises: Seated   Heel Slides  Strengthening;Both   30 reps with green band     Modalities   Modalities  Moist Heat      Moist Heat Therapy   Number Minutes Moist Heat  15 Minutes    Moist Heat Location  Knee   BILAT              PT Short Term Goals - 11/24/18 1337      PT  SHORT TERM GOAL #1   Title  "Independent with initial HEP    Status  Achieved      PT SHORT TERM GOAL #2   Title  "Report pain decrease at rest from  7 /10 to  4 /10 negotiating steps    Baseline  pain 7-8/10 stepping down, not as bad going down.     Status  On-going      PT SHORT TERM GOAL #3    Title  Pt will be able to perform 5 x sts in 15 sec or less to show increased LE strengthening    Baseline  36 sec from chair with intermittent UE assist    Status  On-going        PT Long Term Goals - 11/24/18 1343      PT LONG TERM GOAL #1   Title  "Pt will be independent with advanced HEP.     Status  On-going      PT LONG TERM GOAL #2   Title  "Pt will improve L and R knee/hip  extensor/flexor strength to >/= 4+/5 with </= 2/10 pain to promote safety with walking/standing activities    Status  On-going      PT LONG TERM GOAL #3   Title  "FOTO will improve from 73% limitation   to  57% limitation   indicating improved functional mobility .     Status  On-going      PT LONG TERM GOAL #4   Title  Pt will be instructed and demonstrate floor to mat transfer in order to care for herself living alone with safety    Status  On-going      PT LONG TERM GOAL #5   Title  Pt will be able to negotiate steps with =/< 3/10 pain with safety and awareness of fall prevention    Status  On-going      PT LONG TERM GOAL #6   Title  "Pt will improve her L/R knee flexion to  </= 115 degrees and extension to </= 5 degrees with </= 2/10 pain for a more functional and efficient gait pattern    Status  On-going            Plan - 11/27/18 1157    Clinical Impression Statement  Judie had a little more soreness in her knees today, she thinks it may be the colder weather.  She was able to perform standing hip stretngthening with the machine today.  Her quads were some fatigued as she had decreased ROM with knee extension.  Overall her hip and knee strength continues to improve and she has slightly less knee valgus with ambulation.      Rehab Potential  Good    PT Frequency  2x / week    PT Duration  8 weeks    PT Treatment/Interventions  ADLs/Self Care Home Management;Cryotherapy;Electrical Stimulation;Iontophoresis 4mg /ml Dexamethasone;Moist Heat;Ultrasound;Gait training;Stair training;Functional  mobility training;Therapeutic activities;Therapeutic exercise;Balance training;Neuromuscular re-education;Patient/family education;Manual techniques;Passive range of motion;Scar mobilization;Dry needling;Taping    PT Next Visit Plan  LE strengthening and balance work    Consulted and Agree with Plan of Care  Patient       Patient will benefit from skilled therapeutic intervention in order to improve the following deficits and impairments:  Abnormal gait, Difficulty walking, Obesity, Pain, Decreased mobility, Decreased range of motion, Decreased strength, Increased fascial restricitons, Postural dysfunction, Improper body mechanics  Visit Diagnosis: Chronic pain of left knee  Chronic pain of right knee  Muscle weakness (generalized)  Stiffness of left knee, not elsewhere classified  Stiffness of right knee, not elsewhere classified  Difficulty in walking, not elsewhere classified     Problem List Patient Active Problem List   Diagnosis Date Noted  . Cerebral embolism with cerebral infarction ([place]) 07/10/2013  . Headache(784.0) 03/15/2011  . HYPOGLYCEMIA 10/24/2010  . BACK PAIN, LUMBAR 03/06/2010  . CONSTIPATION, CHRONIC 09/20/2009  . SINUSITIS, CHRONIC 08/16/2009  . UNSTEADY GAIT 07/28/2009  . INSOMNIA, CHRONIC 07/19/2009  . THROMBOCYTOPENIA 02/09/2009  . LEG PAIN, RIGHT 02/01/2009  . Other abnormal glucose 02/01/2009  . CHEST PAIN, ATYPICAL 09/02/2008  . GASTRITIS, HX OF 08/25/2008  . HYPOTHYROIDISM 07/22/2008  . DIVERTICULOSIS-COLON 06/09/2008  . Sciatica of left side 05/06/2008  . VENOUS INSUFFICIENCY 05/03/2008  . GERD 05/03/2008  . NECK PAIN 02/03/2008  . NUMBNESS 01/29/2008  . PULMONARY NODULE 12/26/2007  . HYPERLIPIDEMIA 06/21/2007  . ANEMIA-NOS 06/21/2007  . LEUKOPENIA, CHRONIC 06/21/2007  . ANXIETY 06/21/2007  . DEPRESSION 06/21/2007  . HYPERTENSION 06/21/2007  . CEREBRAL ANEURYSM 06/21/2007  . ALLERGIC RHINITIS 06/21/2007  . IBS 06/21/2007  .  Osteoarthrosis, unspecified whether generalized or localized, unspecified site 06/21/2007  . FIBROMYALGIA 06/21/2007  . CEREBROVASCULAR ACCIDENT, HX OF 06/21/2007    Jeral Pinch PT  11/27/2018, 11:59 AM  [facility] [address] [place], [place], [address] Phone: [phone]   Fax:  [phone]  Name: YURIDIANA FORMANEK MRN: [id number] Date of Birth: 1931-01-20

## 2018-12-03 ENCOUNTER — Ambulatory Visit: Payer: Medicare Other | Admitting: Physical Therapy

## 2018-12-03 ENCOUNTER — Encounter: Payer: Self-pay | Admitting: Physical Therapy

## 2018-12-03 DIAGNOSIS — M25561 Pain in right knee: Secondary | ICD-10-CM

## 2018-12-03 DIAGNOSIS — G8929 Other chronic pain: Secondary | ICD-10-CM

## 2018-12-03 DIAGNOSIS — R262 Difficulty in walking, not elsewhere classified: Secondary | ICD-10-CM

## 2018-12-03 DIAGNOSIS — M25662 Stiffness of left knee, not elsewhere classified: Secondary | ICD-10-CM

## 2018-12-03 DIAGNOSIS — M6281 Muscle weakness (generalized): Secondary | ICD-10-CM

## 2018-12-03 DIAGNOSIS — M25562 Pain in left knee: Principal | ICD-10-CM

## 2018-12-03 DIAGNOSIS — M25661 Stiffness of right knee, not elsewhere classified: Secondary | ICD-10-CM

## 2018-12-03 NOTE — Therapy (Signed)
[place], [place], [address] Phone: [phone]   Fax:  [phone]  Physical Therapy Treatment  Patient Details  Name: Angela Wang MRN: [id number] Date of Birth: Jun 02, 1931 Referring Provider (PT): Shilts, Keenan Bachelor PA   Encounter Date: 12/03/2018  PT End of Session - 12/03/18 0952    Visit Number  8    Number of Visits  20    Date for PT Re-Evaluation  01/12/19    Authorization Type  [employer] Medicare, KX modifier after 15  progress note after note 10    PT Start Time  0952    PT Stop Time  1040    PT Time Calculation (min)  48 min    Activity Tolerance  Patient tolerated treatment well       Past Medical History:  Diagnosis Date  . ALLERGIC RHINITIS 06/21/2007   Qualifier: Diagnosis of  By: Wynona Luna   . Allergy    allergic rhinitis  . Anemia    nos  . ANEMIA-NOS 06/21/2007   Qualifier: Diagnosis of  By: Wynona Luna   . ANXIETY 06/21/2007   Qualifier: Diagnosis of  By: Wynona Luna   . Arthritis    osteoarthritis  . Atypical chest pain 09/2008   negative myoview  . BACK PAIN, LUMBAR 03/06/2010   Qualifier: Diagnosis of  By: Wynona Luna   . CEREBRAL ANEURYSM 06/21/2007   Qualifier: History of  By: Wynona Luna   . CEREBROVASCULAR ACCIDENT, HX OF 06/21/2007   Qualifier: Diagnosis of  By: Wynona Luna   . CHEST PAIN, ATYPICAL 09/02/2008   Qualifier: Diagnosis of  By: Wynona Luna   . Chronic sinusitis   . CONSTIPATION, CHRONIC 09/20/2009   Qualifier: Diagnosis of  By: Olevia Perches MD, Lowella Bandy   . Depression   . DEPRESSION 06/21/2007   Qualifier: Diagnosis of  By: Wynona Luna   . Diverticulosis   . DIVERTICULOSIS-COLON 06/09/2008   Qualifier: Diagnosis of  By: Chester Holstein NP, Nevin Bloodgood    . Fibromyalgia   . FIBROMYALGIA 06/21/2007   Qualifier: Diagnosis of  By: Wynona Luna   . GASTRITIS, HX OF 08/25/2008   Qualifier: Diagnosis of  By: Nelson-Smith CMA  (AAMA), Dottie    . GERD 05/03/2008   Qualifier: Diagnosis of  By: Julien Girt CMA, Marliss Czar    . GERD (gastroesophageal reflux disease)   . Headache 03/15/2011  . History of solitary pulmonary nodule    right, stable by CT  . Hyperlipidemia   . HYPERLIPIDEMIA 06/21/2007   Qualifier: Diagnosis of  By: Wynona Luna   . Hypertension   . HYPERTENSION 06/21/2007   Qualifier: Diagnosis of  By: Wynona Luna   . HYPOGLYCEMIA 10/24/2010   Qualifier: Diagnosis of  By: Wynona Luna   . HYPOTHYROIDISM 07/22/2008   Qualifier: Diagnosis of  By: Wynona Luna   . IBS 06/21/2007   Qualifier: Diagnosis of  By: Wynona Luna   . IBS (irritable bowel syndrome)   . INSOMNIA, CHRONIC 07/19/2009   Qualifier: Diagnosis of  By: Wynona Luna   . LEG PAIN, RIGHT 02/01/2009   Qualifier: Diagnosis of  By: Wynona Luna   . Leukopenia    chronic  . LEUKOPENIA, CHRONIC 06/21/2007   Qualifier: Diagnosis of  By: Wynona Luna   .  NECK PAIN 02/03/2008   Qualifier: Diagnosis of  By: Wynona Luna   . NUMBNESS 01/29/2008   Qualifier: Diagnosis of  By: Loanne Drilling MD, Jacelyn Pi   . Docia Furl NOS-Unspec 06/21/2007   Centricity Description: DEGENERATIVE JOINT DISEASE, MILD Qualifier: Diagnosis of  By: Wynona Luna  Centricity Description: OSTEOARTHRITIS Qualifier: Diagnosis of  By: Wynona Luna   . PULMONARY NODULE 12/26/2007   Qualifier: Diagnosis of  By: Wynona Luna   . Routine general medical examination at a health care facility   . SCIATICA, RIGHT 05/06/2008   Qualifier: Diagnosis of  By: Nathaneil Canary, CMA, Sarah    . Stroke ([place])   . THROMBOCYTOPENIA 02/09/2009   Qualifier: Diagnosis of  By: Wynona Luna   . UNSTEADY GAIT 07/28/2009   Qualifier: Diagnosis of  By: Wynona Luna   . VENOUS INSUFFICIENCY 05/03/2008   Qualifier: Diagnosis of  By: Julien Girt CMA, Marliss Czar      Past Surgical History:  Procedure Laterality Date  . ABDOMINAL HYSTERECTOMY    . ANEURYSM COILING    .  APPENDECTOMY    . CHOLECYSTECTOMY    . JOINT REPLACEMENT     total right knee replacement  . KNEE SURGERY      There were no vitals filed for this visit.  Subjective Assessment - 12/03/18 0952    Subjective  Pt reports she was very sore all through her thighs until Monday .  Pt reports she is getting out of bed better and feels like she is walking some better.     Patient Stated Goals  Goals are to walk better,  be able to get out of my chair, be able to get up from the floor is I need to    Currently in Pain?  Yes    Pain Score  9     Pain Location  Knee   and ankles feel weak   Pain Orientation  Left;Right    Pain Descriptors / Indicators  Aching    Pain Type  Chronic pain    Pain Onset  More than a month ago    Pain Frequency  Constant    Aggravating Factors   all the machines from last tx    Pain Relieving Factors  heat                       [facility] Adult PT Treatment/Exercise - 12/03/18 0001      Self-Care   Self-Care  Other Self-Care Comments    Other Self-Care Comments   seft trigger point release with tennis ball, seated to bilat hamstrings      Exercises   Exercises  Knee/Hip      Knee/Hip Exercises: Aerobic   Nustep  L7x7 min L/UE       Knee/Hip Exercises: Standing   Lateral Step Up  Both;10 reps;Step Height: 6"   VC for form and bilat UE assist   Forward Step Up  Both;Step Height: 6";10 reps   with single UE assist     Knee/Hip Exercises: Seated   Long Arc Quad  Strengthening;Both;3 sets;10 reps    Long Arc Quad Weight  8 lbs.    Heel Slides  Strengthening;Both;2 sets;15 reps   green band     Knee/Hip Exercises: Supine   Bridges Limitations  bridging with legs straight on half bolster 2 x10       Moist Heat Therapy   Number  Minutes Moist Heat  --   held today as our machine is broke              PT Short Term Goals - 11/24/18 1337      PT SHORT TERM GOAL #1   Title  "Independent with initial HEP    Status  Achieved       PT SHORT TERM GOAL #2   Title  "Report pain decrease at rest from  7 /10 to  4 /10 negotiating steps    Baseline  pain 7-8/10 stepping down, not as bad going down.     Status  On-going      PT SHORT TERM GOAL #3   Title  Pt will be able to perform 5 x sts in 15 sec or less to show increased LE strengthening    Baseline  36 sec from chair with intermittent UE assist    Status  On-going        PT Long Term Goals - 11/24/18 1343      PT LONG TERM GOAL #1   Title  "Pt will be independent with advanced HEP.     Status  On-going      PT LONG TERM GOAL #2   Title  "Pt will improve L and R knee/hip  extensor/flexor strength to >/= 4+/5 with </= 2/10 pain to promote safety with walking/standing activities    Status  On-going      PT LONG TERM GOAL #3   Title  "FOTO will improve from 73% limitation   to  57% limitation   indicating improved functional mobility .     Status  On-going      PT LONG TERM GOAL #4   Title  Pt will be instructed and demonstrate floor to mat transfer in order to care for herself living alone with safety    Status  On-going      PT LONG TERM GOAL #5   Title  Pt will be able to negotiate steps with =/< 3/10 pain with safety and awareness of fall prevention    Status  On-going      PT LONG TERM GOAL #6   Title  "Pt will improve her L/R knee flexion to  </= 115 degrees and extension to </= 5 degrees with </= 2/10 pain for a more functional and efficient gait pattern    Status  On-going            Plan - 12/03/18 1025    Clinical Impression Statement  Dallis is tolerating more exercise in standing.  She is able to stand with improved positioning and walking with increased upright posture.  She does stll report high level of knee pain bilat.   Would beneift from continued tx to improve strength and restore functional mobility.     Rehab Potential  Good    PT Frequency  2x / week    PT Duration  8 weeks    PT Treatment/Interventions  ADLs/Self Care Home  Management;Cryotherapy;Electrical Stimulation;Iontophoresis 4mg /ml Dexamethasone;Moist Heat;Ultrasound;Gait training;Stair training;Functional mobility training;Therapeutic activities;Therapeutic exercise;Balance training;Neuromuscular re-education;Patient/family education;Manual techniques;Passive range of motion;Scar mobilization;Dry needling;Taping    PT Next Visit Plan  assess hip/knee strength, cont strengthening and balance     Consulted and Agree with Plan of Care  Patient       Patient will benefit from skilled therapeutic intervention in order to improve the following deficits and impairments:  Abnormal gait, Difficulty walking, Obesity, Pain, Decreased mobility, Decreased range of  motion, Decreased strength, Increased fascial restricitons, Postural dysfunction, Improper body mechanics  Visit Diagnosis: Chronic pain of left knee  Chronic pain of right knee  Muscle weakness (generalized)  Stiffness of left knee, not elsewhere classified  Stiffness of right knee, not elsewhere classified  Difficulty in walking, not elsewhere classified     Problem List Patient Active Problem List   Diagnosis Date Noted  . Cerebral embolism with cerebral infarction ([place]) 07/10/2013  . Headache(784.0) 03/15/2011  . HYPOGLYCEMIA 10/24/2010  . BACK PAIN, LUMBAR 03/06/2010  . CONSTIPATION, CHRONIC 09/20/2009  . SINUSITIS, CHRONIC 08/16/2009  . UNSTEADY GAIT 07/28/2009  . INSOMNIA, CHRONIC 07/19/2009  . THROMBOCYTOPENIA 02/09/2009  . LEG PAIN, RIGHT 02/01/2009  . Other abnormal glucose 02/01/2009  . CHEST PAIN, ATYPICAL 09/02/2008  . GASTRITIS, HX OF 08/25/2008  . HYPOTHYROIDISM 07/22/2008  . DIVERTICULOSIS-COLON 06/09/2008  . Sciatica of left side 05/06/2008  . VENOUS INSUFFICIENCY 05/03/2008  . GERD 05/03/2008  . NECK PAIN 02/03/2008  . NUMBNESS 01/29/2008  . PULMONARY NODULE 12/26/2007  . HYPERLIPIDEMIA 06/21/2007  . ANEMIA-NOS 06/21/2007  . LEUKOPENIA, CHRONIC 06/21/2007  .  ANXIETY 06/21/2007  . DEPRESSION 06/21/2007  . HYPERTENSION 06/21/2007  . CEREBRAL ANEURYSM 06/21/2007  . ALLERGIC RHINITIS 06/21/2007  . IBS 06/21/2007  . Osteoarthrosis, unspecified whether generalized or localized, unspecified site 06/21/2007  . FIBROMYALGIA 06/21/2007  . CEREBROVASCULAR ACCIDENT, HX OF 06/21/2007    Jeral Pinch PT  12/03/2018, 10:44 AM  [facility] [address] [place], [place], [address] Phone: [phone]   Fax:  [phone]  Name: KIMBERLI WINNE MRN: [id number] Date of Birth: 1931/04/14

## 2018-12-05 ENCOUNTER — Encounter: Payer: Self-pay | Admitting: Physical Therapy

## 2018-12-05 ENCOUNTER — Ambulatory Visit: Payer: Medicare Other | Admitting: Physical Therapy

## 2018-12-05 DIAGNOSIS — G8929 Other chronic pain: Secondary | ICD-10-CM

## 2018-12-05 DIAGNOSIS — M25662 Stiffness of left knee, not elsewhere classified: Secondary | ICD-10-CM

## 2018-12-05 DIAGNOSIS — M25562 Pain in left knee: Principal | ICD-10-CM

## 2018-12-05 DIAGNOSIS — M25561 Pain in right knee: Secondary | ICD-10-CM

## 2018-12-05 DIAGNOSIS — R262 Difficulty in walking, not elsewhere classified: Secondary | ICD-10-CM

## 2018-12-05 DIAGNOSIS — M6281 Muscle weakness (generalized): Secondary | ICD-10-CM

## 2018-12-05 DIAGNOSIS — M25661 Stiffness of right knee, not elsewhere classified: Secondary | ICD-10-CM

## 2018-12-05 NOTE — Therapy (Signed)
[place], [place], [address] Phone: [phone]   Fax:  [phone]  Physical Therapy Treatment  Patient Details  Name: Angela Wang MRN: [id number] Date of Birth: Feb 07, 1931 Referring Provider (PT): Shilts, Keenan Bachelor PA   Encounter Date: 12/05/2018  PT End of Session - 12/05/18 0958    Visit Number  9    Number of Visits  20    Date for PT Re-Evaluation  01/12/19    Authorization Type  [employer] Medicare, KX modifier after 15  progress note after note 10    PT Start Time  0959    PT Stop Time  1051    PT Time Calculation (min)  52 min       Past Medical History:  Diagnosis Date  . ALLERGIC RHINITIS 06/21/2007   Qualifier: Diagnosis of  By: Wynona Luna   . Allergy    allergic rhinitis  . Anemia    nos  . ANEMIA-NOS 06/21/2007   Qualifier: Diagnosis of  By: Wynona Luna   . ANXIETY 06/21/2007   Qualifier: Diagnosis of  By: Wynona Luna   . Arthritis    osteoarthritis  . Atypical chest pain 09/2008   negative myoview  . BACK PAIN, LUMBAR 03/06/2010   Qualifier: Diagnosis of  By: Wynona Luna   . CEREBRAL ANEURYSM 06/21/2007   Qualifier: History of  By: Wynona Luna   . CEREBROVASCULAR ACCIDENT, HX OF 06/21/2007   Qualifier: Diagnosis of  By: Wynona Luna   . CHEST PAIN, ATYPICAL 09/02/2008   Qualifier: Diagnosis of  By: Wynona Luna   . Chronic sinusitis   . CONSTIPATION, CHRONIC 09/20/2009   Qualifier: Diagnosis of  By: Olevia Perches MD, Lowella Bandy   . Depression   . DEPRESSION 06/21/2007   Qualifier: Diagnosis of  By: Wynona Luna   . Diverticulosis   . DIVERTICULOSIS-COLON 06/09/2008   Qualifier: Diagnosis of  By: Chester Holstein NP, Nevin Bloodgood    . Fibromyalgia   . FIBROMYALGIA 06/21/2007   Qualifier: Diagnosis of  By: Wynona Luna   . GASTRITIS, HX OF 08/25/2008   Qualifier: Diagnosis of  By: Nelson-Smith CMA (AAMA), Dottie    . GERD 05/03/2008   Qualifier: Diagnosis of   By: Julien Girt CMA, Marliss Czar    . GERD (gastroesophageal reflux disease)   . Headache 03/15/2011  . History of solitary pulmonary nodule    right, stable by CT  . Hyperlipidemia   . HYPERLIPIDEMIA 06/21/2007   Qualifier: Diagnosis of  By: Wynona Luna   . Hypertension   . HYPERTENSION 06/21/2007   Qualifier: Diagnosis of  By: Wynona Luna   . HYPOGLYCEMIA 10/24/2010   Qualifier: Diagnosis of  By: Wynona Luna   . HYPOTHYROIDISM 07/22/2008   Qualifier: Diagnosis of  By: Wynona Luna   . IBS 06/21/2007   Qualifier: Diagnosis of  By: Wynona Luna   . IBS (irritable bowel syndrome)   . INSOMNIA, CHRONIC 07/19/2009   Qualifier: Diagnosis of  By: Wynona Luna   . LEG PAIN, RIGHT 02/01/2009   Qualifier: Diagnosis of  By: Wynona Luna   . Leukopenia    chronic  . LEUKOPENIA, CHRONIC 06/21/2007   Qualifier: Diagnosis of  By: Wynona Luna   . NECK PAIN 02/03/2008   Qualifier: Diagnosis of  By:  Wynona Luna   . NUMBNESS 01/29/2008   Qualifier: Diagnosis of  By: Loanne Drilling MD, Jacelyn Pi   . Docia Furl NOS-Unspec 06/21/2007   Centricity Description: DEGENERATIVE JOINT DISEASE, MILD Qualifier: Diagnosis of  By: Wynona Luna  Centricity Description: OSTEOARTHRITIS Qualifier: Diagnosis of  By: Wynona Luna   . PULMONARY NODULE 12/26/2007   Qualifier: Diagnosis of  By: Wynona Luna   . Routine general medical examination at a health care facility   . SCIATICA, RIGHT 05/06/2008   Qualifier: Diagnosis of  By: Nathaneil Canary, CMA, Sarah    . Stroke ([place])   . THROMBOCYTOPENIA 02/09/2009   Qualifier: Diagnosis of  By: Wynona Luna   . UNSTEADY GAIT 07/28/2009   Qualifier: Diagnosis of  By: Wynona Luna   . VENOUS INSUFFICIENCY 05/03/2008   Qualifier: Diagnosis of  By: Julien Girt CMA, Marliss Czar      Past Surgical History:  Procedure Laterality Date  . ABDOMINAL HYSTERECTOMY    . ANEURYSM COILING    . APPENDECTOMY    . CHOLECYSTECTOMY    . JOINT REPLACEMENT      total right knee replacement  . KNEE SURGERY      There were no vitals filed for this visit.  Subjective Assessment - 12/05/18 1000    Subjective  Pt reports her knees feel stiff and slow moving today.     Patient Stated Goals  Goals are to walk better,  be able to get out of my chair, be able to get up from the floor is I need to    Currently in Pain?  Yes    Pain Score  7     Pain Location  Knee    Pain Orientation  Left;Right                       [facility] Adult PT Treatment/Exercise - 12/05/18 0001      Neuro Re-ed    Neuro Re-ed Details   throwing and catching ball at rebounder on stable surface then on 2" foam pad. Upper trunk rotation with blue bands, side to side on rocker board wiyh bilat UE assist      Exercises   Exercises  Knee/Hip      Knee/Hip Exercises: Stretches   Quad Stretch  Both;3 reps;30 seconds   sidelying, therapist assist for Rt LE    ITB Stretch  1 rep;Both;60 seconds   supine cross body with PT assist     Knee/Hip Exercises: Aerobic   Nustep  L7x7 min L/UE       Knee/Hip Exercises: Seated   Marching  --   30 reps, blue band around knees , sitting on disc   Abduction/Adduction   --   30 reps with blue band              PT Short Term Goals - 11/24/18 1337      PT SHORT TERM GOAL #1   Title  "Independent with initial HEP    Status  Achieved      PT SHORT TERM GOAL #2   Title  "Report pain decrease at rest from  7 /10 to  4 /10 negotiating steps    Baseline  pain 7-8/10 stepping down, not as bad going down.     Status  On-going      PT SHORT TERM GOAL #3   Title  Pt will be able to perform 5 x  sts in 15 sec or less to show increased LE strengthening    Baseline  36 sec from chair with intermittent UE assist    Status  On-going        PT Long Term Goals - 11/24/18 1343      PT LONG TERM GOAL #1   Title  "Pt will be independent with advanced HEP.     Status  On-going      PT LONG TERM GOAL #2   Title  "Pt will  improve L and R knee/hip  extensor/flexor strength to >/= 4+/5 with </= 2/10 pain to promote safety with walking/standing activities    Status  On-going      PT LONG TERM GOAL #3   Title  "FOTO will improve from 73% limitation   to  57% limitation   indicating improved functional mobility .     Status  On-going      PT LONG TERM GOAL #4   Title  Pt will be instructed and demonstrate floor to mat transfer in order to care for herself living alone with safety    Status  On-going      PT LONG TERM GOAL #5   Title  Pt will be able to negotiate steps with =/< 3/10 pain with safety and awareness of fall prevention    Status  On-going      PT LONG TERM GOAL #6   Title  "Pt will improve her L/R knee flexion to  </= 115 degrees and extension to </= 5 degrees with </= 2/10 pain for a more functional and efficient gait pattern    Status  On-going            Plan - 12/05/18 1143    Clinical Impression Statement  Angela Wang was nervous about standing balance work, surprised herself with how well she was able to do.  She is very tight in her Quads and would benefit from more work to loosen them up.  She reports limited tolerance to  lying prone, sidelying stretching worked well for her.      Rehab Potential  Good    PT Frequency  2x / week    PT Duration  8 weeks    PT Treatment/Interventions  ADLs/Self Care Home Management;Cryotherapy;Electrical Stimulation;Iontophoresis 4mg /ml Dexamethasone;Moist Heat;Ultrasound;Gait training;Stair training;Functional mobility training;Therapeutic activities;Therapeutic exercise;Balance training;Neuromuscular re-education;Patient/family education;Manual techniques;Passive range of motion;Scar mobilization;Dry needling;Taping    PT Next Visit Plan  work on quad and ITB flexibility along with Le strengthenign    Consulted and Agree with Plan of Care  Patient       Patient will benefit from skilled therapeutic intervention in order to improve the following deficits  and impairments:  Abnormal gait, Difficulty walking, Obesity, Pain, Decreased mobility, Decreased range of motion, Decreased strength, Increased fascial restricitons, Postural dysfunction, Improper body mechanics  Visit Diagnosis: Chronic pain of left knee  Chronic pain of right knee  Muscle weakness (generalized)  Stiffness of left knee, not elsewhere classified  Stiffness of right knee, not elsewhere classified  Difficulty in walking, not elsewhere classified     Problem List Patient Active Problem List   Diagnosis Date Noted  . Cerebral embolism with cerebral infarction ([place]) 07/10/2013  . Headache(784.0) 03/15/2011  . HYPOGLYCEMIA 10/24/2010  . BACK PAIN, LUMBAR 03/06/2010  . CONSTIPATION, CHRONIC 09/20/2009  . SINUSITIS, CHRONIC 08/16/2009  . UNSTEADY GAIT 07/28/2009  . INSOMNIA, CHRONIC 07/19/2009  . THROMBOCYTOPENIA 02/09/2009  . LEG PAIN, RIGHT 02/01/2009  . Other abnormal  glucose 02/01/2009  . CHEST PAIN, ATYPICAL 09/02/2008  . GASTRITIS, HX OF 08/25/2008  . HYPOTHYROIDISM 07/22/2008  . DIVERTICULOSIS-COLON 06/09/2008  . Sciatica of left side 05/06/2008  . VENOUS INSUFFICIENCY 05/03/2008  . GERD 05/03/2008  . NECK PAIN 02/03/2008  . NUMBNESS 01/29/2008  . PULMONARY NODULE 12/26/2007  . HYPERLIPIDEMIA 06/21/2007  . ANEMIA-NOS 06/21/2007  . LEUKOPENIA, CHRONIC 06/21/2007  . ANXIETY 06/21/2007  . DEPRESSION 06/21/2007  . HYPERTENSION 06/21/2007  . CEREBRAL ANEURYSM 06/21/2007  . ALLERGIC RHINITIS 06/21/2007  . IBS 06/21/2007  . Osteoarthrosis, unspecified whether generalized or localized, unspecified site 06/21/2007  . FIBROMYALGIA 06/21/2007  . CEREBROVASCULAR ACCIDENT, HX OF 06/21/2007    Jeral Pinch PT  12/05/2018, 11:49 AM  [facility] [address] [place], [place], [address] Phone: [phone]   Fax:  [phone]  Name: Angela Wang MRN: [id number] Date of Birth: September 21, 1931

## 2018-12-09 ENCOUNTER — Encounter: Payer: Self-pay | Admitting: Physical Therapy

## 2018-12-09 ENCOUNTER — Ambulatory Visit: Payer: Medicare Other | Admitting: Physical Therapy

## 2018-12-09 DIAGNOSIS — R262 Difficulty in walking, not elsewhere classified: Secondary | ICD-10-CM

## 2018-12-09 DIAGNOSIS — M6281 Muscle weakness (generalized): Secondary | ICD-10-CM

## 2018-12-09 DIAGNOSIS — M25661 Stiffness of right knee, not elsewhere classified: Secondary | ICD-10-CM

## 2018-12-09 DIAGNOSIS — M25562 Pain in left knee: Principal | ICD-10-CM

## 2018-12-09 DIAGNOSIS — M25662 Stiffness of left knee, not elsewhere classified: Secondary | ICD-10-CM

## 2018-12-09 DIAGNOSIS — G8929 Other chronic pain: Secondary | ICD-10-CM

## 2018-12-09 DIAGNOSIS — M25561 Pain in right knee: Secondary | ICD-10-CM

## 2018-12-09 NOTE — Therapy (Signed)
Progress Note Reporting Period 09/16/18 to 12/09/2018  See note below for Objective Data and Assessment of Progress/Goals.        [place], [place], [address] Phone: [phone]   Fax:  [phone]  Physical Therapy Treatment  Patient Details  Name: Angela Wang MRN: [id number] Date of Birth: 12-Sep-1931 Referring Provider (PT): Shilts, Keenan Bachelor PA   Encounter Date: 12/09/2018  PT End of Session - 12/09/18 1015    Visit Number  10    Number of Visits  20    Date for PT Re-Evaluation  01/12/19    Authorization Type  [employer] Medicare, KX modifier after 15  progress note after note 10    PT Start Time  1015    PT Stop Time  1059    PT Time Calculation (min)  44 min    Activity Tolerance  Patient limited by pain       Past Medical History:  Diagnosis Date  . ALLERGIC RHINITIS 06/21/2007   Qualifier: Diagnosis of  By: Wynona Luna   . Allergy    allergic rhinitis  . Anemia    nos  . ANEMIA-NOS 06/21/2007   Qualifier: Diagnosis of  By: Wynona Luna   . ANXIETY 06/21/2007   Qualifier: Diagnosis of  By: Wynona Luna   . Arthritis    osteoarthritis  . Atypical chest pain 09/2008   negative myoview  . BACK PAIN, LUMBAR 03/06/2010   Qualifier: Diagnosis of  By: Wynona Luna   . CEREBRAL ANEURYSM 06/21/2007   Qualifier: History of  By: Wynona Luna   . CEREBROVASCULAR ACCIDENT, HX OF 06/21/2007   Qualifier: Diagnosis of  By: Wynona Luna   . CHEST PAIN, ATYPICAL 09/02/2008   Qualifier: Diagnosis of  By: Wynona Luna   . Chronic sinusitis   . CONSTIPATION, CHRONIC 09/20/2009   Qualifier: Diagnosis of  By: Olevia Perches MD, Lowella Bandy   . Depression   . DEPRESSION 06/21/2007   Qualifier: Diagnosis of  By: Wynona Luna   . Diverticulosis   . DIVERTICULOSIS-COLON 06/09/2008   Qualifier: Diagnosis of  By: Chester Holstein NP, Nevin Bloodgood    . Fibromyalgia   . FIBROMYALGIA 06/21/2007   Qualifier: Diagnosis of  By: Wynona Luna   . GASTRITIS, HX OF 08/25/2008   Qualifier: Diagnosis of  By: Nelson-Smith CMA (AAMA), Dottie    . GERD 05/03/2008   Qualifier: Diagnosis of  By: Julien Girt CMA, Marliss Czar    . GERD (gastroesophageal reflux disease)   . Headache 03/15/2011  . History of solitary pulmonary nodule    right, stable by CT  . Hyperlipidemia   . HYPERLIPIDEMIA 06/21/2007   Qualifier: Diagnosis of  By: Wynona Luna   . Hypertension   . HYPERTENSION 06/21/2007   Qualifier: Diagnosis of  By: Wynona Luna   . HYPOGLYCEMIA 10/24/2010   Qualifier: Diagnosis of  By: Wynona Luna   . HYPOTHYROIDISM 07/22/2008   Qualifier: Diagnosis of  By: Wynona Luna   . IBS 06/21/2007   Qualifier: Diagnosis of  By: Wynona Luna   . IBS (irritable bowel syndrome)   . INSOMNIA, CHRONIC 07/19/2009   Qualifier: Diagnosis of  By: Wynona Luna   . LEG PAIN, RIGHT 02/01/2009   Qualifier: Diagnosis of  By: Wynona Luna   .  Leukopenia    chronic  . LEUKOPENIA, CHRONIC 06/21/2007   Qualifier: Diagnosis of  By: Wynona Luna   . NECK PAIN 02/03/2008   Qualifier: Diagnosis of  By: Wynona Luna   . NUMBNESS 01/29/2008   Qualifier: Diagnosis of  By: Loanne Drilling MD, Jacelyn Pi   . Docia Furl NOS-Unspec 06/21/2007   Centricity Description: DEGENERATIVE JOINT DISEASE, MILD Qualifier: Diagnosis of  By: Wynona Luna  Centricity Description: OSTEOARTHRITIS Qualifier: Diagnosis of  By: Wynona Luna   . PULMONARY NODULE 12/26/2007   Qualifier: Diagnosis of  By: Wynona Luna   . Routine general medical examination at a health care facility   . SCIATICA, RIGHT 05/06/2008   Qualifier: Diagnosis of  By: Nathaneil Canary, CMA, Sarah    . Stroke ([place])   . THROMBOCYTOPENIA 02/09/2009   Qualifier: Diagnosis of  By: Wynona Luna   . UNSTEADY GAIT 07/28/2009   Qualifier: Diagnosis of  By: Wynona Luna   . VENOUS INSUFFICIENCY 05/03/2008   Qualifier: Diagnosis of  By: Julien Girt CMA,  Marliss Czar      Past Surgical History:  Procedure Laterality Date  . ABDOMINAL HYSTERECTOMY    . ANEURYSM COILING    . APPENDECTOMY    . CHOLECYSTECTOMY    . JOINT REPLACEMENT     total right knee replacement  . KNEE SURGERY      There were no vitals filed for this visit.  Subjective Assessment - 12/09/18 1016    Subjective  Angela Wang reports that she is sore from her hip to her lower leg, boths sides.  She thinks it was the cross body stretch that caused it.     Patient Stated Goals  Goals are to walk better,  be able to get out of my chair, be able to get up from the floor is I need to    Currently in Pain?  Yes    Pain Score  7     Pain Location  Leg    Pain Orientation  Left;Right;Lateral    Pain Descriptors / Indicators  Aching;Sore    Pain Type  Chronic pain    Pain Onset  More than a month ago    Pain Frequency  Constant    Aggravating Factors   over stetching    Pain Relieving Factors  heat and meds                       [facility] Adult PT Treatment/Exercise - 12/09/18 0001      Exercises   Exercises  Knee/Hip      Knee/Hip Exercises: Stretches   Quad Stretch  Both;60 seconds   in sidelying active assist   Other Knee/Hip Stretches  3x30 sec SKTC each side - strap assist, LTR stretch 3x30se ceach side      Knee/Hip Exercises: Aerobic   Nustep  L7x7 min L/UE       Knee/Hip Exercises: Seated   Long Arc Quad  Strengthening;Both;5 reps      Manual Therapy   Manual Therapy  Soft tissue mobilization;Manual Traction    Soft tissue mobilization  STM to Rt gluts, quads, ITB/TFL and HS, tightest in the gluts, LT ITB and quads, all done is sidelying    Manual Traction  3x20 sec Lt leg, unable to tolerate on Rt                PT Short Term Goals -  12/09/18 1024      PT SHORT TERM GOAL #1   Title  "Independent with initial HEP    Status  Achieved      PT SHORT TERM GOAL #2   Title  "Report pain decrease at rest from  7 /10 to  4 /10 negotiating steps     Baseline  pain 7-8/10 stepping down, not as bad going down.     Status  On-going      PT SHORT TERM GOAL #3   Title  Pt will be able to perform 5 x sts in 15 sec or less to show increased LE strengthening    Baseline  32 sec from chair with intermittent UE assist    Status  On-going        PT Long Term Goals - 12/09/18 1029      PT LONG TERM GOAL #1   Title  "Pt will be independent with advanced HEP.     Status  On-going      PT LONG TERM GOAL #2   Title  "Pt will improve L and R knee/hip  extensor/flexor strength to >/= 4+/5 with </= 2/10 pain to promote safety with walking/standing activities      PT LONG TERM GOAL #3   Title  "FOTO will improve from 73% limitation   to  57% limitation   indicating improved functional mobility .     Status  On-going      PT LONG TERM GOAL #4   Title  Pt will be instructed and demonstrate floor to mat transfer in order to care for herself living alone with safety    Status  On-going      PT LONG TERM GOAL #5   Title  Pt will be able to negotiate steps with =/< 3/10 pain with safety and awareness of fall prevention    Status  On-going            Plan - 12/09/18 Angela Wang presented for tx today with a lot of lower extremity pain, this was a limiting factor in tx today.  She was very tight in the Rt gluts, ITB, she reported decreased tightness after manaul work however cont knee pain.  She would benefit from continued tx to work on strengthening and stablity through her lower body.     Rehab Potential  Good    PT Frequency  2x / week    PT Duration  8 weeks    PT Treatment/Interventions  ADLs/Self Care Home Management;Cryotherapy;Electrical Stimulation;Iontophoresis 4mg /ml Dexamethasone;Moist Heat;Ultrasound;Gait training;Stair training;Functional mobility training;Therapeutic activities;Therapeutic exercise;Balance training;Neuromuscular re-education;Patient/family education;Manual techniques;Passive  range of motion;Scar mobilization;Dry needling;Taping    PT Next Visit Plan  work on quad and ITB flexibility along with Le strengthenign    Consulted and Agree with Plan of Care  Patient       Patient will benefit from skilled therapeutic intervention in order to improve the following deficits and impairments:  Abnormal gait, Difficulty walking, Obesity, Pain, Decreased mobility, Decreased range of motion, Decreased strength, Increased fascial restricitons, Postural dysfunction, Improper body mechanics  Visit Diagnosis: Chronic pain of left knee  Chronic pain of right knee  Muscle weakness (generalized)  Stiffness of left knee, not elsewhere classified  Difficulty in walking, not elsewhere classified  Stiffness of right knee, not elsewhere classified     Problem List Patient Active Problem List   Diagnosis Date Noted  . Cerebral embolism with cerebral infarction ([place])  07/10/2013  . Headache(784.0) 03/15/2011  . HYPOGLYCEMIA 10/24/2010  . BACK PAIN, LUMBAR 03/06/2010  . CONSTIPATION, CHRONIC 09/20/2009  . SINUSITIS, CHRONIC 08/16/2009  . UNSTEADY GAIT 07/28/2009  . INSOMNIA, CHRONIC 07/19/2009  . THROMBOCYTOPENIA 02/09/2009  . LEG PAIN, RIGHT 02/01/2009  . Other abnormal glucose 02/01/2009  . CHEST PAIN, ATYPICAL 09/02/2008  . GASTRITIS, HX OF 08/25/2008  . HYPOTHYROIDISM 07/22/2008  . DIVERTICULOSIS-COLON 06/09/2008  . Sciatica of left side 05/06/2008  . VENOUS INSUFFICIENCY 05/03/2008  . GERD 05/03/2008  . NECK PAIN 02/03/2008  . NUMBNESS 01/29/2008  . PULMONARY NODULE 12/26/2007  . HYPERLIPIDEMIA 06/21/2007  . ANEMIA-NOS 06/21/2007  . LEUKOPENIA, CHRONIC 06/21/2007  . ANXIETY 06/21/2007  . DEPRESSION 06/21/2007  . HYPERTENSION 06/21/2007  . CEREBRAL ANEURYSM 06/21/2007  . ALLERGIC RHINITIS 06/21/2007  . IBS 06/21/2007  . Osteoarthrosis, unspecified whether generalized or localized, unspecified site 06/21/2007  . FIBROMYALGIA 06/21/2007  . CEREBROVASCULAR  ACCIDENT, HX OF 06/21/2007    Jeral Pinch PT  12/09/2018, 12:42 PM  [facility] [address] [place], [place], [address] Phone: [phone]   Fax:  [phone]  Name: Angela Wang MRN: [id number] Date of Birth: April 27, 1931

## 2018-12-11 ENCOUNTER — Ambulatory Visit: Payer: Medicare Other | Admitting: Physical Therapy

## 2018-12-11 ENCOUNTER — Encounter: Payer: Self-pay | Admitting: Physical Therapy

## 2018-12-11 DIAGNOSIS — G8929 Other chronic pain: Secondary | ICD-10-CM

## 2018-12-11 DIAGNOSIS — M25561 Pain in right knee: Secondary | ICD-10-CM

## 2018-12-11 DIAGNOSIS — M25562 Pain in left knee: Secondary | ICD-10-CM | POA: Diagnosis not present

## 2018-12-11 DIAGNOSIS — M6281 Muscle weakness (generalized): Secondary | ICD-10-CM

## 2018-12-11 DIAGNOSIS — M25662 Stiffness of left knee, not elsewhere classified: Secondary | ICD-10-CM

## 2018-12-11 DIAGNOSIS — M25661 Stiffness of right knee, not elsewhere classified: Secondary | ICD-10-CM

## 2018-12-11 DIAGNOSIS — R262 Difficulty in walking, not elsewhere classified: Secondary | ICD-10-CM

## 2018-12-11 NOTE — Therapy (Signed)
[place], [place], [address] Phone: [phone]   Fax:  [phone]  Physical Therapy Treatment  Patient Details  Name: Angela Wang MRN: [id number] Date of Birth: Nov 07, 1931 Referring Provider (PT): Shilts, Keenan Bachelor PA   Encounter Date: 12/11/2018  PT End of Session - 12/11/18 1018    Visit Number  11    Number of Visits  20    Date for PT Re-Evaluation  01/12/19    Authorization Type  [employer] Medicare, KX modifier after 15  progress note after note 10    PT Start Time  1018    PT Stop Time  1104    PT Time Calculation (min)  46 min    Activity Tolerance  Patient tolerated treatment well       Past Medical History:  Diagnosis Date  . ALLERGIC RHINITIS 06/21/2007   Qualifier: Diagnosis of  By: Wynona Luna   . Allergy    allergic rhinitis  . Anemia    nos  . ANEMIA-NOS 06/21/2007   Qualifier: Diagnosis of  By: Wynona Luna   . ANXIETY 06/21/2007   Qualifier: Diagnosis of  By: Wynona Luna   . Arthritis    osteoarthritis  . Atypical chest pain 09/2008   negative myoview  . BACK PAIN, LUMBAR 03/06/2010   Qualifier: Diagnosis of  By: Wynona Luna   . CEREBRAL ANEURYSM 06/21/2007   Qualifier: History of  By: Wynona Luna   . CEREBROVASCULAR ACCIDENT, HX OF 06/21/2007   Qualifier: Diagnosis of  By: Wynona Luna   . CHEST PAIN, ATYPICAL 09/02/2008   Qualifier: Diagnosis of  By: Wynona Luna   . Chronic sinusitis   . CONSTIPATION, CHRONIC 09/20/2009   Qualifier: Diagnosis of  By: Olevia Perches MD, Lowella Bandy   . Depression   . DEPRESSION 06/21/2007   Qualifier: Diagnosis of  By: Wynona Luna   . Diverticulosis   . DIVERTICULOSIS-COLON 06/09/2008   Qualifier: Diagnosis of  By: Chester Holstein NP, Nevin Bloodgood    . Fibromyalgia   . FIBROMYALGIA 06/21/2007   Qualifier: Diagnosis of  By: Wynona Luna   . GASTRITIS, HX OF 08/25/2008   Qualifier: Diagnosis of  By: Nelson-Smith CMA  (AAMA), Dottie    . GERD 05/03/2008   Qualifier: Diagnosis of  By: Julien Girt CMA, Marliss Czar    . GERD (gastroesophageal reflux disease)   . Headache 03/15/2011  . History of solitary pulmonary nodule    right, stable by CT  . Hyperlipidemia   . HYPERLIPIDEMIA 06/21/2007   Qualifier: Diagnosis of  By: Wynona Luna   . Hypertension   . HYPERTENSION 06/21/2007   Qualifier: Diagnosis of  By: Wynona Luna   . HYPOGLYCEMIA 10/24/2010   Qualifier: Diagnosis of  By: Wynona Luna   . HYPOTHYROIDISM 07/22/2008   Qualifier: Diagnosis of  By: Wynona Luna   . IBS 06/21/2007   Qualifier: Diagnosis of  By: Wynona Luna   . IBS (irritable bowel syndrome)   . INSOMNIA, CHRONIC 07/19/2009   Qualifier: Diagnosis of  By: Wynona Luna   . LEG PAIN, RIGHT 02/01/2009   Qualifier: Diagnosis of  By: Wynona Luna   . Leukopenia    chronic  . LEUKOPENIA, CHRONIC 06/21/2007   Qualifier: Diagnosis of  By: Wynona Luna   .  NECK PAIN 02/03/2008   Qualifier: Diagnosis of  By: Wynona Luna   . NUMBNESS 01/29/2008   Qualifier: Diagnosis of  By: Loanne Drilling MD, Jacelyn Pi   . Docia Furl NOS-Unspec 06/21/2007   Centricity Description: DEGENERATIVE JOINT DISEASE, MILD Qualifier: Diagnosis of  By: Wynona Luna  Centricity Description: OSTEOARTHRITIS Qualifier: Diagnosis of  By: Wynona Luna   . PULMONARY NODULE 12/26/2007   Qualifier: Diagnosis of  By: Wynona Luna   . Routine general medical examination at a health care facility   . SCIATICA, RIGHT 05/06/2008   Qualifier: Diagnosis of  By: Nathaneil Canary, CMA, Sarah    . Stroke ([place])   . THROMBOCYTOPENIA 02/09/2009   Qualifier: Diagnosis of  By: Wynona Luna   . UNSTEADY GAIT 07/28/2009   Qualifier: Diagnosis of  By: Wynona Luna   . VENOUS INSUFFICIENCY 05/03/2008   Qualifier: Diagnosis of  By: Julien Girt CMA, Marliss Czar      Past Surgical History:  Procedure Laterality Date  . ABDOMINAL HYSTERECTOMY    . ANEURYSM COILING    .  APPENDECTOMY    . CHOLECYSTECTOMY    . JOINT REPLACEMENT     total right knee replacement  . KNEE SURGERY      There were no vitals filed for this visit.  Subjective Assessment - 12/11/18 1018    Subjective  Pt reports she  is not as stiff today, hips feel better, knees still sore    Patient Stated Goals  Goals are to walk better,  be able to get out of my chair, be able to get up from the floor is I need to    Currently in Pain?  Yes    Pain Score  7     Pain Location  Knee    Pain Orientation  Left;Right                       [facility] Adult PT Treatment/Exercise - 12/11/18 0001      Knee/Hip Exercises: Stretches   Sports administrator  Both;3 reps;60 seconds   in sidelying therapist overpressure     Knee/Hip Exercises: Aerobic   Nustep  L7x7 min L/UE       Knee/Hip Exercises: Standing   Lateral Step Up  Both;10 reps;Step Height: 6"   UE assist    Forward Step Up  Both;2 sets;10 reps;Step Height: 8"   each side, lots of UE support      Knee/Hip Exercises: Seated   Abduction/Adduction   Strengthening;Both   30 reps with blue band              PT Short Term Goals - 12/09/18 1024      PT SHORT TERM GOAL #1   Title  "Independent with initial HEP    Status  Achieved      PT SHORT TERM GOAL #2   Title  "Report pain decrease at rest from  7 /10 to  4 /10 negotiating steps    Baseline  pain 7-8/10 stepping down, not as bad going down.     Status  On-going      PT SHORT TERM GOAL #3   Title  Pt will be able to perform 5 x sts in 15 sec or less to show increased LE strengthening    Baseline  32 sec from chair with intermittent UE assist    Status  On-going  PT Long Term Goals - 12/09/18 1029      PT LONG TERM GOAL #1   Title  "Pt will be independent with advanced HEP.     Status  On-going      PT LONG TERM GOAL #2   Title  "Pt will improve L and R knee/hip  extensor/flexor strength to >/= 4+/5 with </= 2/10 pain to promote safety with  walking/standing activities      PT LONG TERM GOAL #3   Title  "FOTO will improve from 73% limitation   to  57% limitation   indicating improved functional mobility .     Status  On-going      PT LONG TERM GOAL #4   Title  Pt will be instructed and demonstrate floor to mat transfer in order to care for herself living alone with safety    Status  On-going      PT LONG TERM GOAL #5   Title  Pt will be able to negotiate steps with =/< 3/10 pain with safety and awareness of fall prevention    Status  On-going            Plan - 12/11/18 1116    Clinical Impression Statement  Angela Wang uses compensatory motions to perform FWD step ups with a lot of UE assist,  She is still very tight in her quads however is picking up motion with sidelying stretches.  This should really help improve her movement patterns.      Rehab Potential  Good    PT Frequency  2x / week    PT Duration  8 weeks    PT Treatment/Interventions  ADLs/Self Care Home Management;Cryotherapy;Electrical Stimulation;Iontophoresis 4mg /ml Dexamethasone;Moist Heat;Ultrasound;Gait training;Stair training;Functional mobility training;Therapeutic activities;Therapeutic exercise;Balance training;Neuromuscular re-education;Patient/family education;Manual techniques;Passive range of motion;Scar mobilization;Dry needling;Taping    PT Next Visit Plan  work on quad and ITB flexibility along with Le strengthenign    Consulted and Agree with Plan of Care  Patient       Patient will benefit from skilled therapeutic intervention in order to improve the following deficits and impairments:  Abnormal gait, Difficulty walking, Obesity, Pain, Decreased mobility, Decreased range of motion, Decreased strength, Increased fascial restricitons, Postural dysfunction, Improper body mechanics  Visit Diagnosis: Chronic pain of left knee  Chronic pain of right knee  Muscle weakness (generalized)  Stiffness of left knee, not elsewhere  classified  Difficulty in walking, not elsewhere classified  Stiffness of right knee, not elsewhere classified     Problem List Patient Active Problem List   Diagnosis Date Noted  . Cerebral embolism with cerebral infarction ([place]) 07/10/2013  . Headache(784.0) 03/15/2011  . HYPOGLYCEMIA 10/24/2010  . BACK PAIN, LUMBAR 03/06/2010  . CONSTIPATION, CHRONIC 09/20/2009  . SINUSITIS, CHRONIC 08/16/2009  . UNSTEADY GAIT 07/28/2009  . INSOMNIA, CHRONIC 07/19/2009  . THROMBOCYTOPENIA 02/09/2009  . LEG PAIN, RIGHT 02/01/2009  . Other abnormal glucose 02/01/2009  . CHEST PAIN, ATYPICAL 09/02/2008  . GASTRITIS, HX OF 08/25/2008  . HYPOTHYROIDISM 07/22/2008  . DIVERTICULOSIS-COLON 06/09/2008  . Sciatica of left side 05/06/2008  . VENOUS INSUFFICIENCY 05/03/2008  . GERD 05/03/2008  . NECK PAIN 02/03/2008  . NUMBNESS 01/29/2008  . PULMONARY NODULE 12/26/2007  . HYPERLIPIDEMIA 06/21/2007  . ANEMIA-NOS 06/21/2007  . LEUKOPENIA, CHRONIC 06/21/2007  . ANXIETY 06/21/2007  . DEPRESSION 06/21/2007  . HYPERTENSION 06/21/2007  . CEREBRAL ANEURYSM 06/21/2007  . ALLERGIC RHINITIS 06/21/2007  . IBS 06/21/2007  . Osteoarthrosis, unspecified whether generalized or localized, unspecified site 06/21/2007  .  FIBROMYALGIA 06/21/2007  . CEREBROVASCULAR ACCIDENT, HX OF 06/21/2007    Jeral Pinch PT  12/11/2018, 11:38 AM  [facility] [address] [place], [place], [address] Phone: [phone]   Fax:  [phone]  Name: Angela Wang MRN: [id number] Date of Birth: 01/09/1931

## 2018-12-24 ENCOUNTER — Encounter: Payer: Self-pay | Admitting: Physical Therapy

## 2018-12-24 ENCOUNTER — Ambulatory Visit: Payer: Medicare Other | Admitting: Physical Therapy

## 2018-12-24 DIAGNOSIS — M25561 Pain in right knee: Secondary | ICD-10-CM

## 2018-12-24 DIAGNOSIS — R262 Difficulty in walking, not elsewhere classified: Secondary | ICD-10-CM

## 2018-12-24 DIAGNOSIS — M25562 Pain in left knee: Secondary | ICD-10-CM | POA: Diagnosis not present

## 2018-12-24 DIAGNOSIS — M79604 Pain in right leg: Secondary | ICD-10-CM

## 2018-12-24 DIAGNOSIS — M6281 Muscle weakness (generalized): Secondary | ICD-10-CM

## 2018-12-24 DIAGNOSIS — G8929 Other chronic pain: Secondary | ICD-10-CM

## 2018-12-24 NOTE — Therapy (Signed)
[place], [place], [address] Phone: [phone]   Fax:  [phone]  Physical Therapy Treatment  Patient Details  Name: Angela Wang MRN: [id number] Date of Birth: Oct 28, 1931 Referring Provider (PT): Shilts, Keenan Bachelor PA   Encounter Date: 12/24/2018  PT End of Session - 12/24/18 1144    Visit Number  12    Number of Visits  20    Date for PT Re-Evaluation  01/12/19    Authorization Type  [employer] Medicare, KX modifier after 15  progress note after note 10    PT Start Time  2876    PT Stop Time  8115   on heat end of session   PT Time Calculation (min)  50 min    Activity Tolerance  Patient tolerated treatment well;Patient limited by pain       Past Medical History:  Diagnosis Date  . ALLERGIC RHINITIS 06/21/2007   Qualifier: Diagnosis of  By: Wynona Luna   . Allergy    allergic rhinitis  . Anemia    nos  . ANEMIA-NOS 06/21/2007   Qualifier: Diagnosis of  By: Wynona Luna   . ANXIETY 06/21/2007   Qualifier: Diagnosis of  By: Wynona Luna   . Arthritis    osteoarthritis  . Atypical chest pain 09/2008   negative myoview  . BACK PAIN, LUMBAR 03/06/2010   Qualifier: Diagnosis of  By: Wynona Luna   . CEREBRAL ANEURYSM 06/21/2007   Qualifier: History of  By: Wynona Luna   . CEREBROVASCULAR ACCIDENT, HX OF 06/21/2007   Qualifier: Diagnosis of  By: Wynona Luna   . CHEST PAIN, ATYPICAL 09/02/2008   Qualifier: Diagnosis of  By: Wynona Luna   . Chronic sinusitis   . CONSTIPATION, CHRONIC 09/20/2009   Qualifier: Diagnosis of  By: Olevia Perches MD, Lowella Bandy   . Depression   . DEPRESSION 06/21/2007   Qualifier: Diagnosis of  By: Wynona Luna   . Diverticulosis   . DIVERTICULOSIS-COLON 06/09/2008   Qualifier: Diagnosis of  By: Chester Holstein NP, Nevin Bloodgood    . Fibromyalgia   . FIBROMYALGIA 06/21/2007   Qualifier: Diagnosis of  By: Wynona Luna   . GASTRITIS, HX OF 08/25/2008   Qualifier: Diagnosis of  By: Nelson-Smith CMA (AAMA), Dottie    . GERD 05/03/2008   Qualifier: Diagnosis of  By: Julien Girt CMA, Marliss Czar    . GERD (gastroesophageal reflux disease)   . Headache 03/15/2011  . History of solitary pulmonary nodule    right, stable by CT  . Hyperlipidemia   . HYPERLIPIDEMIA 06/21/2007   Qualifier: Diagnosis of  By: Wynona Luna   . Hypertension   . HYPERTENSION 06/21/2007   Qualifier: Diagnosis of  By: Wynona Luna   . HYPOGLYCEMIA 10/24/2010   Qualifier: Diagnosis of  By: Wynona Luna   . HYPOTHYROIDISM 07/22/2008   Qualifier: Diagnosis of  By: Wynona Luna   . IBS 06/21/2007   Qualifier: Diagnosis of  By: Wynona Luna   . IBS (irritable bowel syndrome)   . INSOMNIA, CHRONIC 07/19/2009   Qualifier: Diagnosis of  By: Wynona Luna   . LEG PAIN, RIGHT 02/01/2009   Qualifier: Diagnosis of  By: Wynona Luna   . Leukopenia    chronic  . LEUKOPENIA, CHRONIC 06/21/2007   Qualifier: Diagnosis of  By: Wynona Luna   . NECK PAIN 02/03/2008   Qualifier: Diagnosis of  By: Wynona Luna   . NUMBNESS 01/29/2008   Qualifier: Diagnosis of  By: Loanne Drilling MD, Jacelyn Pi   . Docia Furl NOS-Unspec 06/21/2007   Centricity Description: DEGENERATIVE JOINT DISEASE, MILD Qualifier: Diagnosis of  By: Wynona Luna  Centricity Description: OSTEOARTHRITIS Qualifier: Diagnosis of  By: Wynona Luna   . PULMONARY NODULE 12/26/2007   Qualifier: Diagnosis of  By: Wynona Luna   . Routine general medical examination at a health care facility   . SCIATICA, RIGHT 05/06/2008   Qualifier: Diagnosis of  By: Nathaneil Canary, CMA, Sarah    . Stroke ([place])   . THROMBOCYTOPENIA 02/09/2009   Qualifier: Diagnosis of  By: Wynona Luna   . UNSTEADY GAIT 07/28/2009   Qualifier: Diagnosis of  By: Wynona Luna   . VENOUS INSUFFICIENCY 05/03/2008   Qualifier: Diagnosis of  By: Julien Girt CMA, Marliss Czar      Past Surgical History:  Procedure Laterality Date  . ABDOMINAL  HYSTERECTOMY    . ANEURYSM COILING    . APPENDECTOMY    . CHOLECYSTECTOMY    . JOINT REPLACEMENT     total right knee replacement  . KNEE SURGERY      There were no vitals filed for this visit.  Subjective Assessment - 12/24/18 1144    Subjective  Pt reports she couldn't come last week because we didnt have any openings,  She had a lot of swelling in her legs last week and wonders if it is from stretching the front of her legs.      Patient Stated Goals  Goals are to walk better,  be able to get out of my chair, be able to get up from the floor is I need to    Currently in Pain?  Yes    Pain Score  6     Pain Location  Knee    Pain Orientation  Right    Pain Descriptors / Indicators  Aching;Sore    Pain Type  Chronic pain    Pain Onset  More than a month ago    Pain Frequency  Constant    Aggravating Factors   walking and over stretching    Pain Relieving Factors  heat                       [facility] Adult PT Treatment/Exercise - 12/24/18 0001      Exercises   Exercises  Knee/Hip      Knee/Hip Exercises: Aerobic   Nustep  L7x7 min L/UE       Knee/Hip Exercises: Seated   Long Arc Quad  Strengthening;Both;2 sets;15 reps   with blue band around knees for abduction   Long Arc Quad Weight  5 lbs.    Hamstring Curl  Strengthening;Both;2 sets;15 reps    Hamstring Limitations  blue band      Knee/Hip Exercises: Supine   Bridges  Strengthening;Both;3 sets;10 reps    Bridges Limitations  legs up on wedge d/t HS spasming in Rt leg with pressing through heels    Straight Leg Raise with External Rotation  Strengthening;Both;2 sets;10 reps   VC for form     Modalities   Modalities  Moist Heat      Moist Heat Therapy   Number Minutes Moist Heat  15 Minutes    Moist Heat Location  Knee   bilat              PT Short Term Goals - 12/09/18 1024      PT SHORT TERM GOAL #1   Title  "Independent with initial HEP    Status  Achieved      PT SHORT TERM GOAL  #2   Title  "Report pain decrease at rest from  7 /10 to  4 /10 negotiating steps    Baseline  pain 7-8/10 stepping down, not as bad going down.     Status  On-going      PT SHORT TERM GOAL #3   Title  Pt will be able to perform 5 x sts in 15 sec or less to show increased LE strengthening    Baseline  32 sec from chair with intermittent UE assist    Status  On-going        PT Long Term Goals - 12/09/18 1029      PT LONG TERM GOAL #1   Title  "Pt will be independent with advanced HEP.     Status  On-going      PT LONG TERM GOAL #2   Title  "Pt will improve L and R knee/hip  extensor/flexor strength to >/= 4+/5 with </= 2/10 pain to promote safety with walking/standing activities      PT LONG TERM GOAL #3   Title  "FOTO will improve from 73% limitation   to  57% limitation   indicating improved functional mobility .     Status  On-going      PT LONG TERM GOAL #4   Title  Pt will be instructed and demonstrate floor to mat transfer in order to care for herself living alone with safety    Status  On-going      PT LONG TERM GOAL #5   Title  Pt will be able to negotiate steps with =/< 3/10 pain with safety and awareness of fall prevention    Status  On-going              Patient will benefit from skilled therapeutic intervention in order to improve the following deficits and impairments:     Visit Diagnosis: Chronic pain of left knee  Chronic pain of right knee  Muscle weakness (generalized)  Difficulty in walking, not elsewhere classified  Pain in right leg     Problem List Patient Active Problem List   Diagnosis Date Noted  . Cerebral embolism with cerebral infarction ([place]) 07/10/2013  . Headache(784.0) 03/15/2011  . HYPOGLYCEMIA 10/24/2010  . BACK PAIN, LUMBAR 03/06/2010  . CONSTIPATION, CHRONIC 09/20/2009  . SINUSITIS, CHRONIC 08/16/2009  . UNSTEADY GAIT 07/28/2009  . INSOMNIA, CHRONIC 07/19/2009  . THROMBOCYTOPENIA 02/09/2009  . LEG PAIN, RIGHT  02/01/2009  . Other abnormal glucose 02/01/2009  . CHEST PAIN, ATYPICAL 09/02/2008  . GASTRITIS, HX OF 08/25/2008  . HYPOTHYROIDISM 07/22/2008  . DIVERTICULOSIS-COLON 06/09/2008  . Sciatica of left side 05/06/2008  . VENOUS INSUFFICIENCY 05/03/2008  . GERD 05/03/2008  . NECK PAIN 02/03/2008  . NUMBNESS 01/29/2008  . PULMONARY NODULE 12/26/2007  . HYPERLIPIDEMIA 06/21/2007  . ANEMIA-NOS 06/21/2007  . LEUKOPENIA, CHRONIC 06/21/2007  . ANXIETY 06/21/2007  . DEPRESSION 06/21/2007  . HYPERTENSION 06/21/2007  . CEREBRAL ANEURYSM 06/21/2007  . ALLERGIC RHINITIS 06/21/2007  . IBS 06/21/2007  . Osteoarthrosis, unspecified whether generalized or localized, unspecified site 06/21/2007  . FIBROMYALGIA 06/21/2007  . CEREBROVASCULAR ACCIDENT, HX OF 06/21/2007    Manuela Schwartz  Shaver PT  12/24/2018, 12:22 PM  [facility] [address] [place], [place], [address] Phone: [phone]   Fax:  [phone]  Name: TANESHA ARAMBULA MRN: [id number] Date of Birth: 1931-01-28

## 2018-12-26 ENCOUNTER — Ambulatory Visit: Payer: Medicare Other | Admitting: Physical Therapy

## 2018-12-26 ENCOUNTER — Encounter: Payer: Self-pay | Admitting: Physical Therapy

## 2018-12-26 DIAGNOSIS — M25562 Pain in left knee: Secondary | ICD-10-CM | POA: Diagnosis not present

## 2018-12-26 DIAGNOSIS — M25561 Pain in right knee: Secondary | ICD-10-CM

## 2018-12-26 DIAGNOSIS — G8929 Other chronic pain: Secondary | ICD-10-CM

## 2018-12-26 DIAGNOSIS — M6281 Muscle weakness (generalized): Secondary | ICD-10-CM

## 2018-12-26 DIAGNOSIS — R262 Difficulty in walking, not elsewhere classified: Secondary | ICD-10-CM

## 2018-12-26 NOTE — Therapy (Signed)
[place], [place], [address] Phone: [phone]   Fax:  [phone]  Physical Therapy Treatment  Patient Details  Name: Angela Wang MRN: [id number] Date of Birth: Jan 04, 1931 Referring Provider (PT): Shilts, Keenan Bachelor PA   Encounter Date: 12/26/2018  PT End of Session - 12/26/18 1152    Visit Number  13    Number of Visits  20    Date for PT Re-Evaluation  01/12/19    Authorization Type  [employer] Medicare, KX modifier after 15  progress note after note 10    PT Start Time  1058    PT Stop Time  1200    PT Time Calculation (min)  62 min    Activity Tolerance  Patient tolerated treatment well;Patient limited by pain    Behavior During Therapy  [facility] for tasks assessed/performed       Past Medical History:  Diagnosis Date  . ALLERGIC RHINITIS 06/21/2007   Qualifier: Diagnosis of  By: Wynona Luna   . Allergy    allergic rhinitis  . Anemia    nos  . ANEMIA-NOS 06/21/2007   Qualifier: Diagnosis of  By: Wynona Luna   . ANXIETY 06/21/2007   Qualifier: Diagnosis of  By: Wynona Luna   . Arthritis    osteoarthritis  . Atypical chest pain 09/2008   negative myoview  . BACK PAIN, LUMBAR 03/06/2010   Qualifier: Diagnosis of  By: Wynona Luna   . CEREBRAL ANEURYSM 06/21/2007   Qualifier: History of  By: Wynona Luna   . CEREBROVASCULAR ACCIDENT, HX OF 06/21/2007   Qualifier: Diagnosis of  By: Wynona Luna   . CHEST PAIN, ATYPICAL 09/02/2008   Qualifier: Diagnosis of  By: Wynona Luna   . Chronic sinusitis   . CONSTIPATION, CHRONIC 09/20/2009   Qualifier: Diagnosis of  By: Olevia Perches MD, Lowella Bandy   . Depression   . DEPRESSION 06/21/2007   Qualifier: Diagnosis of  By: Wynona Luna   . Diverticulosis   . DIVERTICULOSIS-COLON 06/09/2008   Qualifier: Diagnosis of  By: Chester Holstein NP, Nevin Bloodgood    . Fibromyalgia   . FIBROMYALGIA 06/21/2007   Qualifier: Diagnosis of  By: Wynona Luna   . GASTRITIS, HX OF 08/25/2008   Qualifier: Diagnosis of  By: Nelson-Smith CMA (AAMA), Dottie    . GERD 05/03/2008   Qualifier: Diagnosis of  By: Julien Girt CMA, Marliss Czar    . GERD (gastroesophageal reflux disease)   . Headache 03/15/2011  . History of solitary pulmonary nodule    right, stable by CT  . Hyperlipidemia   . HYPERLIPIDEMIA 06/21/2007   Qualifier: Diagnosis of  By: Wynona Luna   . Hypertension   . HYPERTENSION 06/21/2007   Qualifier: Diagnosis of  By: Wynona Luna   . HYPOGLYCEMIA 10/24/2010   Qualifier: Diagnosis of  By: Wynona Luna   . HYPOTHYROIDISM 07/22/2008   Qualifier: Diagnosis of  By: Wynona Luna   . IBS 06/21/2007   Qualifier: Diagnosis of  By: Wynona Luna   . IBS (irritable bowel syndrome)   . INSOMNIA, CHRONIC 07/19/2009   Qualifier: Diagnosis of  By: Wynona Luna   . LEG PAIN, RIGHT 02/01/2009   Qualifier: Diagnosis of  By: Wynona Luna   . Leukopenia    chronic  . LEUKOPENIA, CHRONIC 06/21/2007  Qualifier: Diagnosis of  By: Wynona Luna   . NECK PAIN 02/03/2008   Qualifier: Diagnosis of  By: Wynona Luna   . NUMBNESS 01/29/2008   Qualifier: Diagnosis of  By: Loanne Drilling MD, Jacelyn Pi   . Docia Furl NOS-Unspec 06/21/2007   Centricity Description: DEGENERATIVE JOINT DISEASE, MILD Qualifier: Diagnosis of  By: Wynona Luna  Centricity Description: OSTEOARTHRITIS Qualifier: Diagnosis of  By: Wynona Luna   . PULMONARY NODULE 12/26/2007   Qualifier: Diagnosis of  By: Wynona Luna   . Routine general medical examination at a health care facility   . SCIATICA, RIGHT 05/06/2008   Qualifier: Diagnosis of  By: Nathaneil Canary, CMA, Sarah    . Stroke ([place])   . THROMBOCYTOPENIA 02/09/2009   Qualifier: Diagnosis of  By: Wynona Luna   . UNSTEADY GAIT 07/28/2009   Qualifier: Diagnosis of  By: Wynona Luna   . VENOUS INSUFFICIENCY 05/03/2008   Qualifier: Diagnosis of  By: Julien Girt CMA, Marliss Czar      Past Surgical History:   Procedure Laterality Date  . ABDOMINAL HYSTERECTOMY    . ANEURYSM COILING    . APPENDECTOMY    . CHOLECYSTECTOMY    . JOINT REPLACEMENT     total right knee replacement  . KNEE SURGERY      There were no vitals filed for this visit.  Subjective Assessment - 12/26/18 1102    Subjective  Pt. reports felt good after last treatment, had some mild muscle soreness after but reports pain in knees returned the next day.         [facility] PT Assessment - 12/26/18 0001      AROM   Right Knee Extension  -2    Right Knee Flexion  102    Left Knee Extension  -4    Left Knee Flexion  115                   [facility] Adult PT Treatment/Exercise - 12/26/18 0001      Knee/Hip Exercises: Stretches   Passive Hamstring Stretch  Both;3 reps;30 seconds    Quad Stretch  Left;2 reps;30 seconds    Quad Stretch Limitations  sidelying manual stretches    ITB Stretch  Both;2 reps;30 seconds      Knee/Hip Exercises: Aerobic   Nustep  L7 x 7 min UE/LE      Knee/Hip Exercises: Standing   Lateral Step Up  Both;10 reps;Hand Hold: 2;Step Height: 4"    Lateral Step Up Limitations  initially tried 6 in. step but pt. reported too high    Forward Step Up  Both;10 reps;Hand Hold: 2;Step Height: 4"    Functional Squat  --   box squat/sit<>stand from wedge on low mat 2x5     Knee/Hip Exercises: Seated   Long Arc Quad  Strengthening;Both;2 sets;15 reps   blue band proximally to knees for hip abd emphasis   Long Arc Quad Weight  5 lbs.    Hamstring Curl  Strengthening;Both;2 sets;15 reps    Hamstring Limitations  Blue Theraband    Abduction/Adduction   Strengthening;Both;2 sets;15 reps    Abd/Adduction Limitations  Blue Theraband      Knee/Hip Exercises: Supine   Bridges  Strengthening;Both;2 sets;10 reps    Bridges Limitations  legs on reversed wedge    Straight Leg Raise with External Rotation  Strengthening;Both;2 sets;10 reps    Straight Leg Raise with External Rotation Limitations  verbal/tactile cues for for,      Moist Heat Therapy   Number Minutes Moist Heat  15 Minutes    Moist Heat Location  Knee   bilat.            PT Education - 12/26/18 1151    Education Details  distinction delayed onset muscle soreness vs. joint/OA pain, importance of functional strengthening    Person(s) Educated  Patient    Methods  Explanation    Comprehension  Verbalized understanding       PT Short Term Goals - 12/09/18 1024      PT SHORT TERM GOAL #1   Title  "Independent with initial HEP    Status  Achieved      PT SHORT TERM GOAL #2   Title  "Report pain decrease at rest from  7 /10 to  4 /10 negotiating steps    Baseline  pain 7-8/10 stepping down, not as bad going down.     Status  On-going      PT SHORT TERM GOAL #3   Title  Pt will be able to perform 5 x sts in 15 sec or less to show increased LE strengthening    Baseline  32 sec from chair with intermittent UE assist    Status  On-going        PT Long Term Goals - 12/26/18 1156      PT LONG TERM GOAL #1   Title  "Pt will be independent with advanced HEP.     Baseline  will continue to update prn    Time  8    Period  Weeks    Status  On-going      PT LONG TERM GOAL #2   Title  "Pt will improve L and R knee/hip  extensor/flexor strength to >/= 4+/5 with </= 2/10 pain to promote safety with walking/standing activities    Baseline  not assessed today, progress with strengthening ongoing    Time  8    Period  Weeks    Status  On-going      PT LONG TERM GOAL #3   Title  "FOTO will improve from 73% limitation   to  57% limitation   indicating improved functional mobility .     Baseline  70% limitation at last assessment, not retested today    Time  8    Period  Weeks    Status  On-going      PT LONG TERM GOAL #4   Title  Pt will be instructed and demonstrate floor to mat transfer in order to care for herself living alone with safety    Baseline  not attempted    Time  8    Period  Weeks     Status  On-going      PT LONG TERM GOAL #5   Title  Pt will be able to negotiate steps with =/< 3/10 pain with safety and awareness of fall prevention    Baseline  pain still higher than 3/10, reliant on UE assist due to weakness    Time  8    Period  Weeks    Status  On-going      PT LONG TERM GOAL #6   Title  "Pt will improve her L/R knee flexion to  </= 115 degrees and extension to </= 5 degrees with </= 2/10 pain for a more functional and efficient gait pattern    Baseline  see flowsheet, extension <  5 deg from neutral bilat. but still limited in right>left knee flexion    Time  8    Period  Weeks    Status  Partially Met            Plan - 12/26/18 1153    Clinical Impression Statement  Fair status with therapy-knee pain still as limiting factor for activity tolerance, mild improvements in terms of function with mobility. Continues to require a fair amount of UE assist with stepping exercises.    PT Frequency  2x / week    PT Duration  8 weeks    PT Treatment/Interventions  ADLs/Self Care Home Management;Cryotherapy;Electrical Stimulation;Iontophoresis 45m/ml Dexamethasone;Moist Heat;Ultrasound;Gait training;Stair training;Functional mobility training;Therapeutic activities;Therapeutic exercise;Balance training;Neuromuscular re-education;Patient/family education;Manual techniques;Passive range of motion;Scar mobilization;Dry needling;Taping    PT Next Visit Plan  work on quad and ITB flexibility along with Le strengthening    PT Home Exercise Plan  quad set, heel slide , SLR  Keep these exercises present for ongoing notes    Consulted and Agree with Plan of Care  Patient       Patient will benefit from skilled therapeutic intervention in order to improve the following deficits and impairments:  Abnormal gait, Difficulty walking, Obesity, Pain, Decreased mobility, Decreased range of motion, Decreased strength, Increased fascial restricitons, Postural dysfunction, Improper body  mechanics  Visit Diagnosis: Chronic pain of left knee  Chronic pain of right knee  Muscle weakness (generalized)  Difficulty in walking, not elsewhere classified     Problem List Patient Active Problem List   Diagnosis Date Noted  . C[place]bral [place] ([place] 07/10/2013  . Headache(784.0) 03/15/2011  . HYPOGLYCEMIA 10/24/2010  . BACK PAIN, LUMBAR 03/06/2010  . CONSTIPATION, CHRONIC 09/20/2009  . SINUSITIS, CHRONIC 08/16/2009  . UNSTEADY GAIT 07/28/2009  . INSOMNIA, CHRONIC 07/19/2009  . THROMBOCYTOPENIA 02/09/2009  . LEG PAIN, RIGHT 02/01/2009  . Other abnormal glucose 02/01/2009  . CHEST PAIN, ATYPICAL 09/02/2008  . GASTRITIS, HX OF 08/25/2008  . HYPOTHYROIDISM 07/22/2008  . DIVERTICULOSIS-COLON 06/09/2008  . Sciatica of left side 05/06/2008  . VENOUS INSUFFICIENCY 05/03/2008  . GERD 05/03/2008  . NECK PAIN 02/03/2008  . NUMBNESS 01/29/2008  . PULMONARY NODULE 12/26/2007  . HYPERLIPIDEMIA 06/21/2007  . ANEMIA-NOS 06/21/2007  . LEUKOPENIA, CHRONIC 06/21/2007  . ANXIETY 06/21/2007  . DEPRESSION 06/21/2007  . HYPERTENSION 06/21/2007  . CEREBRAL ANEURYSM 06/21/2007  . ALLERGIC RHINITIS 06/21/2007  . IBS 06/21/2007  . Osteoarthrosis, unspecified whether generalized or localized, unspecified site 06/21/2007  . FIBROMYALGIA 06/21/2007  . CEREBROVASCULAR ACCIDENT, HX OF 06/21/2007    CBeaulah Dinning PT, DPT 12/26/18 12:08 PM  [place] [place] [place] [address]Phone: [phone]  Fax:  [phone] Name: BDIYANA STARRETTMRN: [id number] of Birth: [birth year]

## 2018-12-29 ENCOUNTER — Ambulatory Visit: Payer: Medicare Other | Attending: Physician Assistant | Admitting: Physical Therapy

## 2018-12-29 ENCOUNTER — Encounter: Payer: Self-pay | Admitting: Physical Therapy

## 2018-12-29 DIAGNOSIS — G8929 Other chronic pain: Secondary | ICD-10-CM | POA: Insufficient documentation

## 2018-12-29 DIAGNOSIS — M25562 Pain in left knee: Secondary | ICD-10-CM | POA: Diagnosis present

## 2018-12-29 DIAGNOSIS — M25661 Stiffness of right knee, not elsewhere classified: Secondary | ICD-10-CM

## 2018-12-29 DIAGNOSIS — M6281 Muscle weakness (generalized): Secondary | ICD-10-CM | POA: Diagnosis present

## 2018-12-29 DIAGNOSIS — R262 Difficulty in walking, not elsewhere classified: Secondary | ICD-10-CM | POA: Insufficient documentation

## 2018-12-29 DIAGNOSIS — M25561 Pain in right knee: Secondary | ICD-10-CM | POA: Insufficient documentation

## 2018-12-29 DIAGNOSIS — M79604 Pain in right leg: Secondary | ICD-10-CM | POA: Insufficient documentation

## 2018-12-29 DIAGNOSIS — M25662 Stiffness of left knee, not elsewhere classified: Secondary | ICD-10-CM

## 2018-12-29 NOTE — Therapy (Signed)
[place], [place], [address] Phone: [phone]   Fax:  [phone]  Physical Therapy Treatment  Patient Details  Name: Angela Wang MRN: [id number] Date of Birth: April 29, 1931 Referring Provider (PT): Shilts, Keenan Bachelor PA   Encounter Date: 12/29/2018  PT End of Session - 12/29/18 1013    Visit Number  14    Date for PT Re-Evaluation  01/12/19    Authorization Type  [employer] Medicare, KX modifier after 15  progress note after note 10    PT Start Time  0938    PT Stop Time  1112    PT Time Calculation (min)  58 min    Activity Tolerance  Patient tolerated treatment well       Past Medical History:  Diagnosis Date  . ALLERGIC RHINITIS 06/21/2007   Qualifier: Diagnosis of  By: Wynona Luna   . Allergy    allergic rhinitis  . Anemia    nos  . ANEMIA-NOS 06/21/2007   Qualifier: Diagnosis of  By: Wynona Luna   . ANXIETY 06/21/2007   Qualifier: Diagnosis of  By: Wynona Luna   . Arthritis    osteoarthritis  . Atypical chest pain 09/2008   negative myoview  . BACK PAIN, LUMBAR 03/06/2010   Qualifier: Diagnosis of  By: Wynona Luna   . CEREBRAL ANEURYSM 06/21/2007   Qualifier: History of  By: Wynona Luna   . CEREBROVASCULAR ACCIDENT, HX OF 06/21/2007   Qualifier: Diagnosis of  By: Wynona Luna   . CHEST PAIN, ATYPICAL 09/02/2008   Qualifier: Diagnosis of  By: Wynona Luna   . Chronic sinusitis   . CONSTIPATION, CHRONIC 09/20/2009   Qualifier: Diagnosis of  By: Olevia Perches MD, Lowella Bandy   . Depression   . DEPRESSION 06/21/2007   Qualifier: Diagnosis of  By: Wynona Luna   . Diverticulosis   . DIVERTICULOSIS-COLON 06/09/2008   Qualifier: Diagnosis of  By: Chester Holstein NP, Nevin Bloodgood    . Fibromyalgia   . FIBROMYALGIA 06/21/2007   Qualifier: Diagnosis of  By: Wynona Luna   . GASTRITIS, HX OF 08/25/2008   Qualifier: Diagnosis of  By: Nelson-Smith CMA (AAMA), Dottie    . GERD  05/03/2008   Qualifier: Diagnosis of  By: Julien Girt CMA, Marliss Czar    . GERD (gastroesophageal reflux disease)   . Headache 03/15/2011  . History of solitary pulmonary nodule    right, stable by CT  . Hyperlipidemia   . HYPERLIPIDEMIA 06/21/2007   Qualifier: Diagnosis of  By: Wynona Luna   . Hypertension   . HYPERTENSION 06/21/2007   Qualifier: Diagnosis of  By: Wynona Luna   . HYPOGLYCEMIA 10/24/2010   Qualifier: Diagnosis of  By: Wynona Luna   . HYPOTHYROIDISM 07/22/2008   Qualifier: Diagnosis of  By: Wynona Luna   . IBS 06/21/2007   Qualifier: Diagnosis of  By: Wynona Luna   . IBS (irritable bowel syndrome)   . INSOMNIA, CHRONIC 07/19/2009   Qualifier: Diagnosis of  By: Wynona Luna   . LEG PAIN, RIGHT 02/01/2009   Qualifier: Diagnosis of  By: Wynona Luna   . Leukopenia    chronic  . LEUKOPENIA, CHRONIC 06/21/2007   Qualifier: Diagnosis of  By: Wynona Luna   . NECK PAIN 02/03/2008   Qualifier: Diagnosis of  By: Wynona Luna   . NUMBNESS 01/29/2008   Qualifier: Diagnosis of  By: Loanne Drilling MD, Jacelyn Pi   . Docia Furl NOS-Unspec 06/21/2007   Centricity Description: DEGENERATIVE JOINT DISEASE, MILD Qualifier: Diagnosis of  By: Wynona Luna  Centricity Description: OSTEOARTHRITIS Qualifier: Diagnosis of  By: Wynona Luna   . PULMONARY NODULE 12/26/2007   Qualifier: Diagnosis of  By: Wynona Luna   . Routine general medical examination at a health care facility   . SCIATICA, RIGHT 05/06/2008   Qualifier: Diagnosis of  By: Nathaneil Canary, CMA, Sarah    . Stroke ([place])   . THROMBOCYTOPENIA 02/09/2009   Qualifier: Diagnosis of  By: Wynona Luna   . UNSTEADY GAIT 07/28/2009   Qualifier: Diagnosis of  By: Wynona Luna   . VENOUS INSUFFICIENCY 05/03/2008   Qualifier: Diagnosis of  By: Julien Girt CMA, Marliss Czar      Past Surgical History:  Procedure Laterality Date  . ABDOMINAL HYSTERECTOMY    . ANEURYSM COILING    . APPENDECTOMY    .  CHOLECYSTECTOMY    . JOINT REPLACEMENT     total right knee replacement  . KNEE SURGERY      There were no vitals filed for this visit.  Subjective Assessment - 12/29/18 1014    Subjective  Pt reports she is having a pretty good day so far, knees feel good, Having pain in the out side of Rt foot/ankle and lower leg.     Patient Stated Goals  Goals are to walk better,  be able to get out of my chair, be able to get up from the floor is I need to    Pain Score  8     Pain Location  Leg    Pain Orientation  Right;Lateral    Pain Descriptors / Indicators  Aching    Pain Type  Chronic pain    Pain Onset  1 to 4 weeks ago    Pain Frequency  Intermittent    Aggravating Factors   walking    Pain Score  6    Pain Location  Knee    Pain Orientation  Right;Left    Pain Descriptors / Indicators  Aching    Pain Type  Chronic pain    Pain Onset  More than a month ago    Pain Frequency  Constant    Aggravating Factors   walking/weather     Pain Relieving Factors  heat                       [facility] Adult PT Treatment/Exercise - 12/29/18 0001      Knee/Hip Exercises: Aerobic   Nustep  L7 x 7 min UE/LE      Knee/Hip Exercises: Seated   Long Arc Quad  Strengthening;Both;2 sets;15 reps   with blue band around knees for hip abduction   Long Arc Quad Weight  --   7.5#    Abduction/Adduction   Strengthening;Both;2 sets;15 reps   with blue band during LAQ     Knee/Hip Exercises: Supine   Bridges  Both;5 reps;4 [employer] Limitations  had one cramp in the Rt LE then was ok       Moist Heat Therapy   Number Minutes Moist Heat  15 Minutes    Moist Heat Location  Knee      Manual Therapy   Manual Therapy  Soft tissue  mobilization   bialt   Soft tissue mobilization  Rt ITB and peroneals - rolling                PT Short Term Goals - 12/09/18 1024      PT SHORT TERM GOAL #1   Title  "Independent with initial HEP    Status  Achieved      PT SHORT TERM GOAL  #2   Title  "Report pain decrease at rest from  7 /10 to  4 /10 negotiating steps    Baseline  pain 7-8/10 stepping down, not as bad going down.     Status  On-going      PT SHORT TERM GOAL #3   Title  Pt will be able to perform 5 x sts in 15 sec or less to show increased LE strengthening    Baseline  32 sec from chair with intermittent UE assist    Status  On-going        PT Long Term Goals - 12/26/18 1156      PT LONG TERM GOAL #1   Title  "Pt will be independent with advanced HEP.     Baseline  will continue to update prn    Time  8    Period  Weeks    Status  On-going      PT LONG TERM GOAL #2   Title  "Pt will improve L and R knee/hip  extensor/flexor strength to >/= 4+/5 with </= 2/10 pain to promote safety with walking/standing activities    Baseline  not assessed today, progress with strengthening ongoing    Time  8    Period  Weeks    Status  On-going      PT LONG TERM GOAL #3   Title  "FOTO will improve from 73% limitation   to  57% limitation   indicating improved functional mobility .     Baseline  70% limitation at last assessment, not retested today    Time  8    Period  Weeks    Status  On-going      PT LONG TERM GOAL #4   Title  Pt will be instructed and demonstrate floor to mat transfer in order to care for herself living alone with safety    Baseline  not attempted    Time  8    Period  Weeks    Status  On-going      PT LONG TERM GOAL #5   Title  Pt will be able to negotiate steps with =/< 3/10 pain with safety and awareness of fall prevention    Baseline  pain still higher than 3/10, reliant on UE assist due to weakness    Time  8    Period  Weeks    Status  On-going      PT LONG TERM GOAL #6   Title  "Pt will improve her L/R knee flexion to  </= 115 degrees and extension to </= 5 degrees with </= 2/10 pain for a more functional and efficient gait pattern    Baseline  see flowsheet, extension <5 deg from neutral bilat. but still limited in  right>left knee flexion    Time  8    Period  Weeks    Status  Partially Met            Plan - 12/29/18 1158    Clinical Impression Statement  Terrion with less knee pain today however  was having increased pain in her Rt lateral leg - peroneals and gastroc.  Responded well to manual work with reports of decreased tightness.  Pt verbalized today that she knows she will never be painfree and will be happy to be able to move better and safer.      Rehab Potential  Good    PT Frequency  2x / week    PT Duration  8 weeks    PT Treatment/Interventions  ADLs/Self Care Home Management;Cryotherapy;Electrical Stimulation;Iontophoresis 24m/ml Dexamethasone;Moist Heat;Ultrasound;Gait training;Stair training;Functional mobility training;Therapeutic activities;Therapeutic exercise;Balance training;Neuromuscular re-education;Patient/family education;Manual techniques;Passive range of motion;Scar mobilization;Dry needling;Taping    PT Next Visit Plan  work on quad and ITB flexibility along with Le strengthening    Consulted and Agree with Plan of Care  Patient       Patient will benefit from skilled therapeutic intervention in order to improve the following deficits and impairments:  Abnormal gait, Difficulty walking, Obesity, Pain, Decreased mobility, Decreased range of motion, Decreased strength, Increased fascial restricitons, Postural dysfunction, Improper body mechanics  Visit Diagnosis: Chronic pain of left knee  Chronic pain of right knee  Muscle weakness (generalized)  Difficulty in walking, not elsewhere classified  Pain in right leg  Stiffness of left knee, not elsewhere classified  Stiffness of right knee, not elsewhere classified     Problem List Patient Active Problem List   Diagnosis Date Noted  . Cerebral embolism with cerebral infarction ([place] 07/10/2013  . Headache(784.0) 03/15/2011  . HYPOGLYCEMIA 10/24/2010  . BACK PAIN, LUMBAR 03/06/2010  . CONSTIPATION, CHRONIC  09/20/2009  . SINUSITIS, CHRONIC 08/16/2009  . UNSTEADY GAIT 07/28/2009  . INSOMNIA, CHRONIC 07/19/2009  . THROMBOCYTOPENIA 02/09/2009  . LEG PAIN, RIGHT 02/01/2009  . Other abnormal glucose 02/01/2009  . CHEST PAIN, ATYPICAL 09/02/2008  . GASTRITIS, HX OF 08/25/2008  . HYPOTHYROIDISM 07/22/2008  . DIVERTICULOSIS-COLON 06/09/2008  . Sciatica of left side 05/06/2008  . VENOUS INSUFFICIENCY 05/03/2008  . GERD 05/03/2008  . NECK PAIN 02/03/2008  . NUMBNESS 01/29/2008  . PULMONARY NODULE 12/26/2007  . HYPERLIPIDEMIA 06/21/2007  . ANEMIA-NOS 06/21/2007  . LEUKOPENIA, CHRONIC 06/21/2007  . ANXIETY 06/21/2007  . DEPRESSION 06/21/2007  . HYPERTENSION 06/21/2007  . CEREBRAL ANEURYSM 06/21/2007  . ALLERGIC RHINITIS 06/21/2007  . IBS 06/21/2007  . Osteoarthrosis, unspecified whether generalized or localized, unspecified site 06/21/2007  . FIBROMYALGIA 06/21/2007  . CEREBROVASCULAR ACCIDENT, HX OF 06/21/2007    SJeral PinchPT  12/29/2018, 12:01 PM  CD[facility] Ltd17[address] Longfellow StreetGHaslett NAlaska [address]Phone: [phone]  Fax:  [phone] Name: Angela SADIQMRN: [id number] of Birth: [birth year]

## 2018-12-31 ENCOUNTER — Encounter: Payer: Self-pay | Admitting: Physical Therapy

## 2018-12-31 ENCOUNTER — Ambulatory Visit: Payer: Medicare Other | Admitting: Physical Therapy

## 2018-12-31 DIAGNOSIS — G8929 Other chronic pain: Secondary | ICD-10-CM

## 2018-12-31 DIAGNOSIS — M25661 Stiffness of right knee, not elsewhere classified: Secondary | ICD-10-CM

## 2018-12-31 DIAGNOSIS — M25561 Pain in right knee: Secondary | ICD-10-CM

## 2018-12-31 DIAGNOSIS — M25562 Pain in left knee: Secondary | ICD-10-CM | POA: Diagnosis not present

## 2018-12-31 DIAGNOSIS — R262 Difficulty in walking, not elsewhere classified: Secondary | ICD-10-CM

## 2018-12-31 DIAGNOSIS — M6281 Muscle weakness (generalized): Secondary | ICD-10-CM

## 2018-12-31 DIAGNOSIS — M79604 Pain in right leg: Secondary | ICD-10-CM

## 2018-12-31 DIAGNOSIS — M25662 Stiffness of left knee, not elsewhere classified: Secondary | ICD-10-CM

## 2018-12-31 NOTE — Therapy (Signed)
[facility] [address] [place], [place], [address] Phone: [phone]   Fax:  [phone]  Physical Therapy Treatment  Patient Details  Name: Angela Wang MRN: [id number] Date of Birth: 05/07/1931 Referring Provider (PT): Shilts, John Andrew PA   Encounter Date: 12/31/2018  PT End of Session - 12/31/18 1123    Visit Number  15    Number of Visits  20    Date for PT Re-Evaluation  01/12/19    Authorization Type  [employer]HC Medicare, now add KX    PT Start Time  1100    PT Stop Time  1201    PT Time Calculation (min)  61 min    Activity Tolerance  Patient tolerated treatment well    Behavior During Therapy  WFL for tasks assessed/performed       Past Medical History:  Diagnosis Date  . ALLERGIC RHINITIS 06/21/2007   Qualifier: Diagnosis of  By: Yoo DO, D. Robert   . Allergy    allergic rhinitis  . Anemia    nos  . ANEMIA-NOS 06/21/2007   Qualifier: Diagnosis of  By: Yoo DO, D. Robert   . ANXIETY 06/21/2007   Qualifier: Diagnosis of  By: Yoo DO, D. Robert   . Arthritis    osteoarthritis  . Atypical chest pain 09/2008   negative myoview  . BACK PAIN, LUMBAR 03/06/2010   Qualifier: Diagnosis of  By: Yoo DO, D. Robert   . CEREBRAL ANEURYSM 06/21/2007   Qualifier: History of  By: Yoo DO, D. Robert   . CEREBROVASCULAR ACCIDENT, HX OF 06/21/2007   Qualifier: Diagnosis of  By: Yoo DO, D. Robert   . CHEST PAIN, ATYPICAL 09/02/2008   Qualifier: Diagnosis of  By: Yoo DO, D. Robert   . Chronic sinusitis   . CONSTIPATION, CHRONIC 09/20/2009   Qualifier: Diagnosis of  By: Brodie MD, Dora M   . Depression   . DEPRESSION 06/21/2007   Qualifier: Diagnosis of  By: Yoo DO, D. Robert   . Diverticulosis   . DIVERTICULOSIS-COLON 06/09/2008   Qualifier: Diagnosis of  By: Guenther NP, Paula    . Fibromyalgia   . FIBROMYALGIA 06/21/2007   Qualifier: Diagnosis of  By: Yoo DO, D. Robert   . GASTRITIS, HX OF 08/25/2008   Qualifier: Diagnosis of   By: Nelson-Smith CMA (AAMA), Dottie    . GERD 05/03/2008   Qualifier: Diagnosis of  By: Adkins CMA, Leigh    . GERD (gastroesophageal reflux disease)   . Headache 03/15/2011  . History of solitary pulmonary nodule    right, stable by CT  . Hyperlipidemia   . HYPERLIPIDEMIA 06/21/2007   Qualifier: Diagnosis of  By: Yoo DO, D. Robert   . Hypertension   . HYPERTENSION 06/21/2007   Qualifier: Diagnosis of  By: Yoo DO, D. Robert   . HYPOGLYCEMIA 10/24/2010   Qualifier: Diagnosis of  By: Yoo DO, D. Robert   . HYPOTHYROIDISM 07/22/2008   Qualifier: Diagnosis of  By: Yoo DO, D. Robert   . IBS 06/21/2007   Qualifier: Diagnosis of  By: Yoo DO, D. Robert   . IBS (irritable bowel syndrome)   . INSOMNIA, CHRONIC 07/19/2009   Qualifier: Diagnosis of  By: Yoo DO, D. Robert   . LEG PAIN, RIGHT 02/01/2009   Qualifier: Diagnosis of  By: Yoo DO, D. Robert   . Leukopenia    chronic  . LEUKOPENIA, CHRONIC 06/21/2007   Qualifier: Diagnosis of  By: Yoo DO, D.   Robert   . NECK PAIN 02/03/2008   Qualifier: Diagnosis of  By: Wynona Luna   . NUMBNESS 01/29/2008   Qualifier: Diagnosis of  By: Loanne Drilling MD, Jacelyn Pi   . Docia Furl NOS-Unspec 06/21/2007   Centricity Description: DEGENERATIVE JOINT DISEASE, MILD Qualifier: Diagnosis of  By: Wynona Luna  Centricity Description: OSTEOARTHRITIS Qualifier: Diagnosis of  By: Wynona Luna   . PULMONARY NODULE 12/26/2007   Qualifier: Diagnosis of  By: Wynona Luna   . Routine general medical examination at a health care facility   . SCIATICA, RIGHT 05/06/2008   Qualifier: Diagnosis of  By: Nathaneil Canary, CMA, Sarah    . Stroke ([place])   . THROMBOCYTOPENIA 02/09/2009   Qualifier: Diagnosis of  By: Wynona Luna   . UNSTEADY GAIT 07/28/2009   Qualifier: Diagnosis of  By: Wynona Luna   . VENOUS INSUFFICIENCY 05/03/2008   Qualifier: Diagnosis of  By: Julien Girt CMA, Marliss Czar      Past Surgical History:  Procedure Laterality Date  . ABDOMINAL HYSTERECTOMY    . ANEURYSM  COILING    . APPENDECTOMY    . CHOLECYSTECTOMY    . JOINT REPLACEMENT     total right knee replacement  . KNEE SURGERY      There were no vitals filed for this visit.  Subjective Assessment - 12/31/18 1103    Subjective  "A little sore" after last session but "knee feels better, moving and bending like it's supposed to."    Patient Stated Goals  Goals are to walk better,  be able to get out of my chair, be able to get up from the floor is I need to                       [facility] Adult PT Treatment/Exercise - 12/31/18 0001      Knee/Hip Exercises: Stretches   Passive Hamstring Stretch  Both;2 reps;30 seconds    Quad Stretch  Right;Left;2 reps;30 seconds    Quad Stretch Limitations  sidelying manual stretch    ITB Stretch  Both;2 reps;30 seconds      Knee/Hip Exercises: Aerobic   Nustep  L7 x 7 min UE/LE      Knee/Hip Exercises: Standing   Lateral Step Up  Both;10 reps;Hand Hold: 2;Step Height: 4"    Functional Squat Limitations  box squat to wedge on low mat 3 sets of 5      Knee/Hip Exercises: Seated   Long Arc Quad  Strengthening;Both;2 sets;15 reps   Blue Theraband around knees for abduction   [employer] Weight  5 lbs.   5 instead of 7.5 per pt. request   Hamstring Curl  Strengthening;Right;Left;2 sets;15 reps    Hamstring Limitations  Blue Theraband    Abduction/Adduction   Strengthening;Both;2 sets;15 reps    Abd/Adduction Limitations  Blue Theraband      Knee/Hip Exercises: Supine   Bridges  Strengthening;Both;15 reps    Bridges Limitations  legs on bolster, able without cramps      Moist Heat Therapy   Number Minutes Moist Heat  15 Minutes    Moist Heat Location  Knee   bilat.            PT Education - 12/31/18 1123    Education Details  exercises, POC    Person(s) Educated  Patient    Methods  Explanation;Demonstration;Verbal cues    Comprehension  Verbalized understanding;Returned demonstration  PT Short Term Goals -  12/09/18 1024      PT SHORT TERM GOAL #1   Title  "Independent with initial HEP    Status  Achieved      PT SHORT TERM GOAL #2   Title  "Report pain decrease at rest from  7 /10 to  4 /10 negotiating steps    Baseline  pain 7-8/10 stepping down, not as bad going down.     Status  On-going      PT SHORT TERM GOAL #3   Title  Pt will be able to perform 5 x sts in 15 sec or less to show increased LE strengthening    Baseline  32 sec from chair with intermittent UE assist    Status  On-going        PT Long Term Goals - 12/26/18 1156      PT LONG TERM GOAL #1   Title  "Pt will be independent with advanced HEP.     Baseline  will continue to update prn    Time  8    Period  Weeks    Status  On-going      PT LONG TERM GOAL #2   Title  "Pt will improve L and R knee/hip  extensor/flexor strength to >/= 4+/5 with </= 2/10 pain to promote safety with walking/standing activities    Baseline  not assessed today, progress with strengthening ongoing    Time  8    Period  Weeks    Status  On-going      PT LONG TERM GOAL #3   Title  "FOTO will improve from 73% limitation   to  57% limitation   indicating improved functional mobility .     Baseline  70% limitation at last assessment, not retested today    Time  8    Period  Weeks    Status  On-going      PT LONG TERM GOAL #4   Title  Pt will be instructed and demonstrate floor to mat transfer in order to care for herself living alone with safety    Baseline  not attempted    Time  8    Period  Weeks    Status  On-going      PT LONG TERM GOAL #5   Title  Pt will be able to negotiate steps with =/< 3/10 pain with safety and awareness of fall prevention    Baseline  pain still higher than 3/10, reliant on UE assist due to weakness    Time  8    Period  Weeks    Status  On-going      PT LONG TERM GOAL #6   Title  "Pt will improve her L/R knee flexion to  </= 115 degrees and extension to </= 5 degrees with </= 2/10 pain for a more  functional and efficient gait pattern    Baseline  see flowsheet, extension <5 deg from neutral bilat. but still limited in right>left knee flexion    Time  8    Period  Weeks    Status  Partially Met            Plan - 12/31/18 1131    Clinical Impression Statement  Some muscle soreness consistent with delayed onset muscle soreness from exercises, mild improvement in knee pain. Functionally pt. has noted improvements in walking tolerance for prolonged community mobility, still having some issues with steos at her home due   to knee weakness and pain. Plan continue PT per last progress note with tentative plans to transition to independent exercise program at end.    Rehab Potential  Good    PT Frequency  2x / week    PT Duration  8 weeks    PT Treatment/Interventions  ADLs/Self Care Home Management;Cryotherapy;Electrical Stimulation;Iontophoresis 2m/ml Dexamethasone;Moist Heat;Ultrasound;Gait training;Stair training;Functional mobility training;Therapeutic activities;Therapeutic exercise;Balance training;Neuromuscular re-education;Patient/family education;Manual techniques;Passive range of motion;Scar mobilization;Dry needling;Taping    PT Next Visit Plan  work on quad and ITB flexibility along with Le strengthening    PT Home Exercise Plan  quad set, heel slide , SLR  Keep these exercises present for ongoing notes    Consulted and Agree with Plan of Care  Patient       Patient will benefit from skilled therapeutic intervention in order to improve the following deficits and impairments:  Abnormal gait, Difficulty walking, Obesity, Pain, Decreased mobility, Decreased range of motion, Decreased strength, Increased fascial restricitons, Postural dysfunction, Improper body mechanics  Visit Diagnosis: Chronic pain of left knee  Chronic pain of right knee  Muscle weakness (generalized)  Difficulty in walking, not elsewhere classified  Pain in right leg  Stiffness of left knee, not  elsewhere classified  Stiffness of right knee, not elsewhere classified     Problem List Patient Active Problem List   Diagnosis Date Noted  . Cerebral [place]ism with cerebral infarction ([place] 07/10/2013  . Headache(784.0) 03/15/2011  . HYPOGLYCEMIA 10/24/2010  . BACK PAIN, LUMBAR 03/06/2010  . CONSTIPATION, CHRONIC 09/20/2009  . SINUSITIS, CHRONIC 08/16/2009  . UNSTEADY GAIT 07/28/2009  . INSOMNIA, CHRONIC 07/19/2009  . THROMBOCYTOPENIA 02/09/2009  . LEG PAIN, RIGHT 02/01/2009  . Other abnormal glucose 02/01/2009  . CHEST PAIN, ATYPICAL 09/02/2008  . GASTRITIS, HX OF 08/25/2008  . HYPOTHYROIDISM 07/22/2008  . DIVERTICULOSIS-COLON 06/09/2008  . Sciatica of left side 05/06/2008  . VENOUS INSUFFICIENCY 05/03/2008  . GERD 05/03/2008  . NECK PAIN 02/03/2008  . NUMBNESS 01/29/2008  . PULMONARY NODULE 12/26/2007  . HYPERLIPIDEMIA 06/21/2007  . ANEMIA-NOS 06/21/2007  . LEUKOPENIA, CHRONIC 06/21/2007  . ANXIETY 06/21/2007  . DEPRESSION 06/21/2007  . HYPERTENSION 06/21/2007  . CEREBRAL ANEURYSM 06/21/2007  . ALLERGIC RHINITIS 06/21/2007  . IBS 06/21/2007  . Osteoarthrosis, unspecified whether generalized or localized, unspecified site 06/21/2007  . FIBROMYALGIA 06/21/2007  . CEREBROVASCULAR ACCIDENT, HX OF 06/21/2007    CBeaulah Dinning PT, DPT 12/31/18 12:02 PM  [facility] [address]Phone: [phone]  Fax:  [phone] Name: Angela BILEKMRN: [id number] of Birth: 401-14-1932

## 2019-01-05 ENCOUNTER — Ambulatory Visit: Payer: Medicare Other | Admitting: Physical Therapy

## 2019-01-05 ENCOUNTER — Encounter: Payer: Self-pay | Admitting: Physical Therapy

## 2019-01-05 DIAGNOSIS — M25561 Pain in right knee: Secondary | ICD-10-CM

## 2019-01-05 DIAGNOSIS — M25662 Stiffness of left knee, not elsewhere classified: Secondary | ICD-10-CM

## 2019-01-05 DIAGNOSIS — M25661 Stiffness of right knee, not elsewhere classified: Secondary | ICD-10-CM

## 2019-01-05 DIAGNOSIS — M6281 Muscle weakness (generalized): Secondary | ICD-10-CM

## 2019-01-05 DIAGNOSIS — R262 Difficulty in walking, not elsewhere classified: Secondary | ICD-10-CM

## 2019-01-05 DIAGNOSIS — M79604 Pain in right leg: Secondary | ICD-10-CM

## 2019-01-05 DIAGNOSIS — M25562 Pain in left knee: Principal | ICD-10-CM

## 2019-01-05 DIAGNOSIS — G8929 Other chronic pain: Secondary | ICD-10-CM

## 2019-01-05 NOTE — Therapy (Signed)
[facility] [place], [place], [address] Phone: [phone]   Fax:  [phone]  Physical Therapy Treatment  Patient Details  Name: Angela Wang MRN: [id number] Date of Birth: November 09, 1931 Referring Provider (PT): Shilts, Keenan Bachelor PA   Encounter Date: 01/05/2019  PT End of Session - 01/05/19 1134    Visit Number  16    Number of Visits  20    Date for PT Re-Evaluation  01/12/19    Authorization Type  [employer] Medicare, now add KX    PT Start Time  1101    PT Stop Time  1200   5 min TENS education not included in direct tx. minutes   PT Time Calculation (min)  59 min    Activity Tolerance  Patient tolerated treatment well    Behavior During Therapy  [facility] for tasks assessed/performed       Past Medical History:  Diagnosis Date  . ALLERGIC RHINITIS 06/21/2007   Qualifier: Diagnosis of  By: Wynona Luna   . Allergy    allergic rhinitis  . Anemia    nos  . ANEMIA-NOS 06/21/2007   Qualifier: Diagnosis of  By: Wynona Luna   . ANXIETY 06/21/2007   Qualifier: Diagnosis of  By: Wynona Luna   . Arthritis    osteoarthritis  . Atypical chest pain 09/2008   negative myoview  . BACK PAIN, LUMBAR 03/06/2010   Qualifier: Diagnosis of  By: Wynona Luna   . CEREBRAL ANEURYSM 06/21/2007   Qualifier: History of  By: Wynona Luna   . CEREBROVASCULAR ACCIDENT, HX OF 06/21/2007   Qualifier: Diagnosis of  By: Wynona Luna   . CHEST PAIN, ATYPICAL 09/02/2008   Qualifier: Diagnosis of  By: Wynona Luna   . Chronic sinusitis   . CONSTIPATION, CHRONIC 09/20/2009   Qualifier: Diagnosis of  By: Olevia Perches MD, Lowella Bandy   . Depression   . DEPRESSION 06/21/2007   Qualifier: Diagnosis of  By: Wynona Luna   . Diverticulosis   . DIVERTICULOSIS-COLON 06/09/2008   Qualifier: Diagnosis of  By: Chester Holstein NP, Nevin Bloodgood    . Fibromyalgia   . FIBROMYALGIA 06/21/2007   Qualifier: Diagnosis of  By: Wynona Luna    . GASTRITIS, HX OF 08/25/2008   Qualifier: Diagnosis of  By: Nelson-Smith CMA (AAMA), Dottie    . GERD 05/03/2008   Qualifier: Diagnosis of  By: Julien Girt CMA, Marliss Czar    . GERD (gastroesophageal reflux disease)   . Headache 03/15/2011  . History of solitary pulmonary nodule    right, stable by CT  . Hyperlipidemia   . HYPERLIPIDEMIA 06/21/2007   Qualifier: Diagnosis of  By: Wynona Luna   . Hypertension   . HYPERTENSION 06/21/2007   Qualifier: Diagnosis of  By: Wynona Luna   . HYPOGLYCEMIA 10/24/2010   Qualifier: Diagnosis of  By: Wynona Luna   . HYPOTHYROIDISM 07/22/2008   Qualifier: Diagnosis of  By: Wynona Luna   . IBS 06/21/2007   Qualifier: Diagnosis of  By: Wynona Luna   . IBS (irritable bowel syndrome)   . INSOMNIA, CHRONIC 07/19/2009   Qualifier: Diagnosis of  By: Wynona Luna   . LEG PAIN, RIGHT 02/01/2009   Qualifier: Diagnosis of  By: Wynona Luna   . Leukopenia    chronic  . LEUKOPENIA, CHRONIC  06/21/2007   Qualifier: Diagnosis of  By: Wynona Luna   . NECK PAIN 02/03/2008   Qualifier: Diagnosis of  By: Wynona Luna   . NUMBNESS 01/29/2008   Qualifier: Diagnosis of  By: Loanne Drilling MD, Jacelyn Pi   . Docia Furl NOS-Unspec 06/21/2007   Centricity Description: DEGENERATIVE JOINT DISEASE, MILD Qualifier: Diagnosis of  By: Wynona Luna  Centricity Description: OSTEOARTHRITIS Qualifier: Diagnosis of  By: Wynona Luna   . PULMONARY NODULE 12/26/2007   Qualifier: Diagnosis of  By: Wynona Luna   . Routine general medical examination at a health care facility   . SCIATICA, RIGHT 05/06/2008   Qualifier: Diagnosis of  By: Nathaneil Canary, CMA, Sarah    . Stroke ([place])   . THROMBOCYTOPENIA 02/09/2009   Qualifier: Diagnosis of  By: Wynona Luna   . UNSTEADY GAIT 07/28/2009   Qualifier: Diagnosis of  By: Wynona Luna   . VENOUS INSUFFICIENCY 05/03/2008   Qualifier: Diagnosis of  By: Julien Girt CMA, Marliss Czar      Past Surgical History:   Procedure Laterality Date  . ABDOMINAL HYSTERECTOMY    . ANEURYSM COILING    . APPENDECTOMY    . CHOLECYSTECTOMY    . JOINT REPLACEMENT     total right knee replacement  . KNEE SURGERY      There were no vitals filed for this visit.  Subjective Assessment - 01/05/19 1106    Subjective  Pt. reports that she feels like stretches made LE swelling worse. She continues with high pain level in both knees. Pt. brought old home TENS unit and requests review how to use.    Currently in Pain?  Yes    Pain Score  8     Pain Location  Knee    Pain Orientation  Right;Left    Pain Descriptors / Indicators  Aching    Pain Type  Chronic pain    Pain Onset  More than a month ago    Pain Frequency  Intermittent    Aggravating Factors   weightbearing, walking, activity    Pain Relieving Factors  rest    Effect of Pain on Daily Activities  limits mobility tolerance for ADLs and community ambulation         [facility] PT Assessment - 01/05/19 0001      AROM   Right Knee Extension  -4    Right Knee Flexion  105    Left Knee Extension  -3    Left Knee Flexion  110                   [facility] Adult PT Treatment/Exercise - 01/05/19 0001      Knee/Hip Exercises: Aerobic   Nustep  L6-7 x 7 min      Knee/Hip Exercises: Standing   Heel Raises  Both;10 reps    Heel Raises Limitations  on Airex    Hip Abduction  AROM;Both;15 reps    Abduction Limitations  2 lbs.    Forward Step Up  Right;Left;1 set;10 reps;Hand Hold: 2;Step Height: 6"    Functional Squat Limitations  box squat to wedge on low table 2x5 reps with 5 lb. KB, no UE use for sit>stand      Knee/Hip Exercises: Seated   Long Arc Quad  Strengthening;Both;2 sets;15 reps    Long Arc Quad Weight  5 lbs.    Hamstring Curl  Strengthening;Both;15 reps    Hamstring Limitations  Blue Theraband      Knee/Hip Exercises: Supine   Bridges  Strengthening;15 reps    Bridges Limitations  legs on bolster      Moist Heat Therapy   Number  Minutes Moist Heat  15 Minutes    Moist Heat Location  Knee             PT Education - 01/05/19 1134    Education Details  POC, home TENS use    Person(s) Educated  Patient    Methods  Explanation;Demonstration    Comprehension  Verbalized understanding;Returned demonstration       PT Short Term Goals - 12/09/18 1024      PT SHORT TERM GOAL #1   Title  "Independent with initial HEP    Status  Achieved      PT SHORT TERM GOAL #2   Title  "Report pain decrease at rest from  7 /10 to  4 /10 negotiating steps    Baseline  pain 7-8/10 stepping down, not as bad going down.     Status  On-going      PT SHORT TERM GOAL #3   Title  Pt will be able to perform 5 x sts in 15 sec or less to show increased LE strengthening    Baseline  32 sec from chair with intermittent UE assist    Status  On-going        PT Long Term Goals - 01/05/19 1242      PT LONG TERM GOAL #1   Title  "Pt will be independent with advanced HEP.     Baseline  will continue to update prn    Time  8    Period  Weeks    Status  On-going      PT LONG TERM GOAL #2   Title  "Pt will improve L and R knee/hip  extensor/flexor strength to >/= 4+/5 with </= 2/10 pain to promote safety with walking/standing activities    Baseline  not assessed today, progress with strengthening ongoing    Time  8    Period  Weeks    Status  On-going      PT LONG TERM GOAL #3   Title  "FOTO will improve from 73% limitation   to  57% limitation   indicating improved functional mobility .     Baseline  70% limitation at last assessment, not retested today    Time  8    Period  Weeks    Status  On-going      PT LONG TERM GOAL #4   Title  Pt will be instructed and demonstrate floor to mat transfer in order to care for herself living alone with safety    Baseline  not attempted    Time  8    Period  Weeks    Status  On-going      PT LONG TERM GOAL #5   Title  Pt will be able to negotiate steps with =/< 3/10 pain with  safety and awareness of fall prevention    Baseline  pain still higher than 3/10, reliant on UE assist due to weakness    Time  8    Period  Weeks    Status  On-going      PT LONG TERM GOAL #6   Title  "Pt will improve her L/R knee flexion to  </= 115 degrees and extension to </= 5 degrees with </= 2/10 pain for a more  functional and efficient gait pattern    Baseline  see flowsheet    Time  8    Period  Weeks    Status  Partially Met            Plan - 01/05/19 1236    Clinical Impression Statement  Unclear how stretches which have generally involved elevation of limbs above heart level could exacerbate swelling but held stretches per pt. request today. Fair status overall with therapy with continued pain/mild functional improvements for stepping ability, stair navigation but lacks quad strength in closed chain on right>left side with decreased eccentric control limiting ability stepdown motions.    Rehab Potential  Fair    Clinical Impairments Affecting Rehab Potential  comorbidities, continued high pain level    PT Frequency  2x / week    PT Duration  8 weeks    PT Treatment/Interventions  ADLs/Self Care Home Management;Cryotherapy;Electrical Stimulation;Iontophoresis 20m/ml Dexamethasone;Moist Heat;Ultrasound;Gait training;Stair training;Functional mobility training;Therapeutic activities;Therapeutic exercise;Balance training;Neuromuscular re-education;Patient/family education;Manual techniques;Passive range of motion;Scar mobilization;Dry needling;Taping    PT Next Visit Plan  potentially re-add stretches pending pt. preference, c/o swelling, continue exercise progresssion, tentatively plan 2 more visits then d/c to HEP    PT Home Exercise Plan  quad set, heel slide , SLR  Keep these exercises present for ongoing notes    Consulted and Agree with Plan of Care  Patient       Patient will benefit from skilled therapeutic intervention in order to improve the following deficits and  impairments:  Abnormal gait, Difficulty walking, Obesity, Pain, Decreased mobility, Decreased range of motion, Decreased strength, Increased fascial restricitons, Postural dysfunction, Improper body mechanics  Visit Diagnosis: Chronic pain of left knee  Chronic pain of right knee  Muscle weakness (generalized)  Difficulty in walking, not elsewhere classified  Pain in right leg  Stiffness of left knee, not elsewhere classified  Stiffness of right knee, not elsewhere classified     Problem List Patient Active Problem List   Diagnosis Date Noted  . Cerebral [place]ism with cerebral infarction ([place] 07/10/2013  . Headache(784.0) 03/15/2011  . HYPOGLYCEMIA 10/24/2010  . BACK PAIN, LUMBAR 03/06/2010  . CONSTIPATION, CHRONIC 09/20/2009  . SINUSITIS, CHRONIC 08/16/2009  . UNSTEADY GAIT 07/28/2009  . INSOMNIA, CHRONIC 07/19/2009  . THROMBOCYTOPENIA 02/09/2009  . LEG PAIN, RIGHT 02/01/2009  . Other abnormal glucose 02/01/2009  . CHEST PAIN, ATYPICAL 09/02/2008  . GASTRITIS, HX OF 08/25/2008  . HYPOTHYROIDISM 07/22/2008  . DIVERTICULOSIS-COLON 06/09/2008  . Sciatica of left side 05/06/2008  . VENOUS INSUFFICIENCY 05/03/2008  . GERD 05/03/2008  . NECK PAIN 02/03/2008  . NUMBNESS 01/29/2008  . PULMONARY NODULE 12/26/2007  . HYPERLIPIDEMIA 06/21/2007  . ANEMIA-NOS 06/21/2007  . LEUKOPENIA, CHRONIC 06/21/2007  . ANXIETY 06/21/2007  . DEPRESSION 06/21/2007  . HYPERTENSION 06/21/2007  . CEREBRAL ANEURYSM 06/21/2007  . ALLERGIC RHINITIS 06/21/2007  . IBS 06/21/2007  . Osteoarthrosis, unspecified whether generalized or localized, unspecified site 06/21/2007  . FIBROMYALGIA 06/21/2007  . CEREBROVASCULAR ACCIDENT, HX OF 06/21/2007    CBeaulah Dinning PT, DPT 01/05/19 12:44 PM  CBroad[facility]CG[facility] Association194[address]GAlburnett NAl[place] [address]Phone: [phone]  Fax:  [phone] Name: Angela PROWELLMRN: [id number] of Birth:  [birth year]

## 2019-01-07 ENCOUNTER — Ambulatory Visit: Payer: Medicare Other | Admitting: Physical Therapy

## 2019-01-07 DIAGNOSIS — R262 Difficulty in walking, not elsewhere classified: Secondary | ICD-10-CM

## 2019-01-07 DIAGNOSIS — M25661 Stiffness of right knee, not elsewhere classified: Secondary | ICD-10-CM

## 2019-01-07 DIAGNOSIS — M25662 Stiffness of left knee, not elsewhere classified: Secondary | ICD-10-CM

## 2019-01-07 DIAGNOSIS — M25562 Pain in left knee: Principal | ICD-10-CM

## 2019-01-07 DIAGNOSIS — M25561 Pain in right knee: Secondary | ICD-10-CM

## 2019-01-07 DIAGNOSIS — G8929 Other chronic pain: Secondary | ICD-10-CM

## 2019-01-07 DIAGNOSIS — M6281 Muscle weakness (generalized): Secondary | ICD-10-CM

## 2019-01-07 NOTE — Therapy (Signed)
[place], [place], [address] Phone: [phone]   Fax:  [phone]  Physical Therapy Treatment  Patient Details  Name: Angela Wang MRN: [id number] Date of Birth: 1931-09-08 Referring Provider (PT): Shilts, Keenan Bachelor PA   Encounter Date: 01/07/2019  PT End of Session - 01/07/19 1002    Visit Number  17    Number of Visits  20    Date for PT Re-Evaluation  01/12/19    Authorization Type  [employer] Medicare, now add KX    PT Start Time  1005    PT Stop Time  1103    PT Time Calculation (min)  58 min    Activity Tolerance  Patient limited by pain       Past Medical History:  Diagnosis Date  . ALLERGIC RHINITIS 06/21/2007   Qualifier: Diagnosis of  By: Wynona Luna   . Allergy    allergic rhinitis  . Anemia    nos  . ANEMIA-NOS 06/21/2007   Qualifier: Diagnosis of  By: Wynona Luna   . ANXIETY 06/21/2007   Qualifier: Diagnosis of  By: Wynona Luna   . Arthritis    osteoarthritis  . Atypical chest pain 09/2008   negative myoview  . BACK PAIN, LUMBAR 03/06/2010   Qualifier: Diagnosis of  By: Wynona Luna   . CEREBRAL ANEURYSM 06/21/2007   Qualifier: History of  By: Wynona Luna   . CEREBROVASCULAR ACCIDENT, HX OF 06/21/2007   Qualifier: Diagnosis of  By: Wynona Luna   . CHEST PAIN, ATYPICAL 09/02/2008   Qualifier: Diagnosis of  By: Wynona Luna   . Chronic sinusitis   . CONSTIPATION, CHRONIC 09/20/2009   Qualifier: Diagnosis of  By: Olevia Perches MD, Lowella Bandy   . Depression   . DEPRESSION 06/21/2007   Qualifier: Diagnosis of  By: Wynona Luna   . Diverticulosis   . DIVERTICULOSIS-COLON 06/09/2008   Qualifier: Diagnosis of  By: Chester Holstein NP, Nevin Bloodgood    . Fibromyalgia   . FIBROMYALGIA 06/21/2007   Qualifier: Diagnosis of  By: Wynona Luna   . GASTRITIS, HX OF 08/25/2008   Qualifier: Diagnosis of  By: Nelson-Smith CMA (AAMA), Dottie    . GERD 05/03/2008   Qualifier:  Diagnosis of  By: Julien Girt CMA, Marliss Czar    . GERD (gastroesophageal reflux disease)   . Headache 03/15/2011  . History of solitary pulmonary nodule    right, stable by CT  . Hyperlipidemia   . HYPERLIPIDEMIA 06/21/2007   Qualifier: Diagnosis of  By: Wynona Luna   . Hypertension   . HYPERTENSION 06/21/2007   Qualifier: Diagnosis of  By: Wynona Luna   . HYPOGLYCEMIA 10/24/2010   Qualifier: Diagnosis of  By: Wynona Luna   . HYPOTHYROIDISM 07/22/2008   Qualifier: Diagnosis of  By: Wynona Luna   . IBS 06/21/2007   Qualifier: Diagnosis of  By: Wynona Luna   . IBS (irritable bowel syndrome)   . INSOMNIA, CHRONIC 07/19/2009   Qualifier: Diagnosis of  By: Wynona Luna   . LEG PAIN, RIGHT 02/01/2009   Qualifier: Diagnosis of  By: Wynona Luna   . Leukopenia    chronic  . LEUKOPENIA, CHRONIC 06/21/2007   Qualifier: Diagnosis of  By: Wynona Luna   . NECK PAIN 02/03/2008   Qualifier: Diagnosis  of  By: Wynona Luna   . NUMBNESS 01/29/2008   Qualifier: Diagnosis of  By: Loanne Drilling MD, Jacelyn Pi   . Docia Furl NOS-Unspec 06/21/2007   Centricity Description: DEGENERATIVE JOINT DISEASE, MILD Qualifier: Diagnosis of  By: Wynona Luna  Centricity Description: OSTEOARTHRITIS Qualifier: Diagnosis of  By: Wynona Luna   . PULMONARY NODULE 12/26/2007   Qualifier: Diagnosis of  By: Wynona Luna   . Routine general medical examination at a health care facility   . SCIATICA, RIGHT 05/06/2008   Qualifier: Diagnosis of  By: Nathaneil Canary, CMA, Sarah    . Stroke ([place])   . THROMBOCYTOPENIA 02/09/2009   Qualifier: Diagnosis of  By: Wynona Luna   . UNSTEADY GAIT 07/28/2009   Qualifier: Diagnosis of  By: Wynona Luna   . VENOUS INSUFFICIENCY 05/03/2008   Qualifier: Diagnosis of  By: Julien Girt CMA, Marliss Czar      Past Surgical History:  Procedure Laterality Date  . ABDOMINAL HYSTERECTOMY    . ANEURYSM COILING    . APPENDECTOMY    . CHOLECYSTECTOMY    . JOINT  REPLACEMENT     total right knee replacement  . KNEE SURGERY      There were no vitals filed for this visit.  Subjective Assessment - 01/07/19 1005    Subjective  Pt reports her knees are pretty good today however both her heels are sore - thinks she needs new shoes with more cushion in her heels    Patient Stated Goals  Goals are to walk better,  be able to get out of my chair, be able to get up from the floor is I need to    Currently in Pain?  Yes    Pain Score  6     Pain Location  Knee    Pain Orientation  Left;Right    Pain Descriptors / Indicators  Dull    Pain Type  Chronic pain    Pain Radiating Towards  into lateral leg and heel Rt > Lt     Pain Onset  More than a month ago    Pain Frequency  Intermittent    Aggravating Factors   walking    Pain Relieving Factors  rest and heat    Multiple Pain Sites  Yes    Pain Score  8    Pain Location  Heel    Pain Orientation  Left;Right    Pain Descriptors / Indicators  Aching    Pain Type  Chronic pain    Aggravating Factors   walking    Pain Relieving Factors  rest                       [facility] Adult PT Treatment/Exercise - 01/07/19 0001      Exercises   Exercises  Knee/Hip      Knee/Hip Exercises: Stretches   Other Knee/Hip Stretches  3x10 sec leg lengtheners bilat      Knee/Hip Exercises: Aerobic   Nustep  L7 x 7 min U/LE      Knee/Hip Exercises: Standing   Wall Squat  2 sets;10 reps    Other Standing Knee Exercises  10 reps each , green band biceps and tricpe curls, FWD punch engaging legs for stabilization.       Knee/Hip Exercises: Seated   Other Seated Knee/Hip Exercises  10 reps each arm , green band, row with band under feet  Knee/Hip Exercises: Supine   Bridges  Strengthening;Both;2 sets;15 reps    Bridges Limitations  legs on bolster    Straight Leg Raises  Strengthening;Both;2 sets;15 reps    Straight Leg Raises Limitations  lifting leg off bolster, VC for engaging quads      Moist  Heat Therapy   Number Minutes Moist Heat  15 Minutes    Moist Heat Location  Knee   bilat            PT Education - 01/07/19 1015    Education Details  HEP for her arms per her request    Person(s) Educated  Patient    Methods  Explanation;Demonstration;Handout    Comprehension  Returned demonstration;Verbalized understanding       PT Short Term Goals - 01/07/19 1031      PT SHORT TERM GOAL #1   Title  "Independent with initial HEP    Status  Achieved      PT SHORT TERM GOAL #2   Title  "Report pain decrease at rest from  7 /10 to  4 /10 negotiating steps    Status  On-going      PT SHORT TERM GOAL #3   Title  Pt will be able to perform 5 x sts in 15 sec or less to show increased LE strengthening    Status  On-going        PT Long Term Goals - 01/07/19 1031      PT LONG TERM GOAL #1   Title  "Pt will be independent with advanced HEP.     Status  On-going      PT LONG TERM GOAL #2   Title  "Pt will improve L and R knee/hip  extensor/flexor strength to >/= 4+/5 with </= 2/10 pain to promote safety with walking/standing activities    Status  On-going      PT LONG TERM GOAL #3   Title  "FOTO will improve from 73% limitation   to  57% limitation   indicating improved functional mobility .     Status  On-going      PT LONG TERM GOAL #4   Title  Pt will be instructed and demonstrate floor to mat transfer in order to care for herself living alone with safety    Baseline  not attempted    Status  On-going      PT LONG TERM GOAL #5   Title  Pt will be able to negotiate steps with =/< 3/10 pain with safety and awareness of fall prevention    Status  On-going            Plan - 01/07/19 1043    Clinical Impression Carmichael is still hesitant regarding streches to her quads.  This should help her long term howerver she has increased pain in the short term with these. She is getting stronger in her legs however she still has a lot of pain with functional  activities    Rehab Potential  Fair    Clinical Impairments Affecting Rehab Potential  comorbidities, continued high pain level    PT Frequency  2x / week    PT Duration  8 weeks    PT Treatment/Interventions  ADLs/Self Care Home Management;Cryotherapy;Electrical Stimulation;Iontophoresis 4mg /ml Dexamethasone;Moist Heat;Ultrasound;Gait training;Stair training;Functional mobility training;Therapeutic activities;Therapeutic exercise;Balance training;Neuromuscular re-education;Patient/family education;Manual techniques;Passive range of motion;Scar mobilization;Dry needling;Taping    PT Next Visit Plan  potentially re-add stretches pending pt. preference, c/o swelling, continue exercise progresssion, tentatively  plan 2 more visits then d/c to HEP    Consulted and Agree with Plan of Care  Patient       Patient will benefit from skilled therapeutic intervention in order to improve the following deficits and impairments:  Abnormal gait, Difficulty walking, Obesity, Pain, Decreased mobility, Decreased range of motion, Decreased strength, Increased fascial restricitons, Postural dysfunction, Improper body mechanics  Visit Diagnosis: Chronic pain of left knee  Chronic pain of right knee  Difficulty in walking, not elsewhere classified  Muscle weakness (generalized)  Stiffness of left knee, not elsewhere classified  Stiffness of right knee, not elsewhere classified     Problem List Patient Active Problem List   Diagnosis Date Noted  . Cerebral embolism with cerebral infarction ([place]) 07/10/2013  . Headache(784.0) 03/15/2011  . HYPOGLYCEMIA 10/24/2010  . BACK PAIN, LUMBAR 03/06/2010  . CONSTIPATION, CHRONIC 09/20/2009  . SINUSITIS, CHRONIC 08/16/2009  . UNSTEADY GAIT 07/28/2009  . INSOMNIA, CHRONIC 07/19/2009  . THROMBOCYTOPENIA 02/09/2009  . LEG PAIN, RIGHT 02/01/2009  . Other abnormal glucose 02/01/2009  . CHEST PAIN, ATYPICAL 09/02/2008  . GASTRITIS, HX OF 08/25/2008  .  HYPOTHYROIDISM 07/22/2008  . DIVERTICULOSIS-COLON 06/09/2008  . Sciatica of left side 05/06/2008  . VENOUS INSUFFICIENCY 05/03/2008  . GERD 05/03/2008  . NECK PAIN 02/03/2008  . NUMBNESS 01/29/2008  . PULMONARY NODULE 12/26/2007  . HYPERLIPIDEMIA 06/21/2007  . ANEMIA-NOS 06/21/2007  . LEUKOPENIA, CHRONIC 06/21/2007  . ANXIETY 06/21/2007  . DEPRESSION 06/21/2007  . HYPERTENSION 06/21/2007  . CEREBRAL ANEURYSM 06/21/2007  . ALLERGIC RHINITIS 06/21/2007  . IBS 06/21/2007  . Osteoarthrosis, unspecified whether generalized or localized, unspecified site 06/21/2007  . FIBROMYALGIA 06/21/2007  . CEREBROVASCULAR ACCIDENT, HX OF 06/21/2007    Jeral Pinch PT  01/07/2019, 10:48 AM  [facility] [address] [place], [place], [address] Phone: [phone]   Fax:  [phone]  Name: DELONA CLASBY MRN: [id number] Date of Birth: [birth year]

## 2019-01-12 ENCOUNTER — Encounter: Payer: Self-pay | Admitting: Physical Therapy

## 2019-01-12 ENCOUNTER — Ambulatory Visit: Payer: Medicare Other | Admitting: Physical Therapy

## 2019-01-12 DIAGNOSIS — M25661 Stiffness of right knee, not elsewhere classified: Secondary | ICD-10-CM

## 2019-01-12 DIAGNOSIS — M25662 Stiffness of left knee, not elsewhere classified: Secondary | ICD-10-CM

## 2019-01-12 DIAGNOSIS — G8929 Other chronic pain: Secondary | ICD-10-CM

## 2019-01-12 DIAGNOSIS — M25561 Pain in right knee: Secondary | ICD-10-CM

## 2019-01-12 DIAGNOSIS — R262 Difficulty in walking, not elsewhere classified: Secondary | ICD-10-CM

## 2019-01-12 DIAGNOSIS — M6281 Muscle weakness (generalized): Secondary | ICD-10-CM

## 2019-01-12 DIAGNOSIS — M25562 Pain in left knee: Principal | ICD-10-CM
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
## 2019-01-13 ENCOUNTER — Ambulatory Visit: Payer: Medicare Other | Admitting: Podiatry

## 2019-01-13 ENCOUNTER — Other Ambulatory Visit: Payer: Self-pay

## 2019-01-13 DIAGNOSIS — B351 Tinea unguium: Secondary | ICD-10-CM

## 2019-01-13 DIAGNOSIS — M79674 Pain in right toe(s): Secondary | ICD-10-CM | POA: Diagnosis not present

## 2019-01-13 DIAGNOSIS — M722 Plantar fascial fibromatosis: Secondary | ICD-10-CM

## 2019-01-13 DIAGNOSIS — M2141 Flat foot [pes planus] (acquired), right foot: Secondary | ICD-10-CM

## 2019-01-13 DIAGNOSIS — M2142 Flat foot [pes planus] (acquired), left foot: Secondary | ICD-10-CM

## 2019-01-13 DIAGNOSIS — M79675 Pain in left toe(s): Secondary | ICD-10-CM

## 2019-01-13 NOTE — Patient Instructions (Signed)

## 2019-01-22 ENCOUNTER — Encounter: Payer: Self-pay | Admitting: Podiatry

## 2019-01-22 NOTE — Progress Notes (Signed)
Subjective: Angela Wang presents today with painful, thick toenails 1-5 b/l that she cannot cut and which interfere with daily activities.  Pain is aggravated when wearing enclosed shoe gear.  Patient also has history of chronic venous insufficiency with lower extremity edema bilaterally.  She has had Unna boot therapy for this in the past.  She does have history of plantar fasciitis for which Dr. Jacqualyn Posey was treating her for.  She was prescribed physical therapy for this.  [place], [place], PA-C is her PCP.   Current Outpatient Medications:  .  acetaminophen-codeine (TYLENOL #3) 300-30 MG tablet, Take 1-2 tablets by mouth every 8 (eight) hours as needed for moderate pain., Disp: 30 tablet, Rfl: 0 .  acetaminophen-codeine (TYLENOL #3) 300-30 MG tablet, Take 1-2 tablets by mouth every 6 (six) hours as needed for moderate pain. (Patient not taking: Reported on 06/05/2018), Disp: 30 tablet, Rfl: 0 .  acetaminophen-codeine (TYLENOL #3) 300-30 MG tablet, Take 1 tablet by mouth 2 (two) times daily as needed for moderate pain., Disp: 30 tablet, Rfl: 0 .  acetaminophen-codeine (TYLENOL #3) 300-30 MG tablet, Take 1 tablet by mouth every 8 (eight) hours as needed for moderate pain., Disp: 12 tablet, Rfl: 0 .  ALPRAZolam (XANAX) 0.5 MG tablet, Take 0.5 mg by mouth 3 (three) times daily as needed for sleep or anxiety., Disp: , Rfl:  .  amoxicillin (AMOXIL) 875 MG tablet, , Disp: , Rfl:  .  amoxicillin-clavulanate (AUGMENTIN) 500-125 MG tablet, Take 1 tablet (500 mg total) by mouth every 8 (eight) hours., Disp: 21 tablet, Rfl: 0 .  B Complex Vitamins (B COMPLEX 100 PO), Take 1 tablet by mouth daily.  , Disp: , Rfl:  .  bisacodyl (DULCOLAX) 5 MG EC tablet, Take 1 to 2 tablets by mouth as needed nightly, Disp: , Rfl:  .  Calcium Carbonate-Vitamin D (CALCIUM-VITAMIN D) 500-200 MG-UNIT per tablet, Take 1 tablet by mouth 3 (three) times daily.  , Disp: , Rfl:  .  Calcium-Magnesium-Vitamin D (CALCIUM 500)  500-250-200 MG-MG-UNIT TABS, Take by mouth., Disp: , Rfl:  .  cetirizine (ZYRTEC) 5 MG tablet, Take 1 tablet (5 mg total) by mouth daily. (Patient not taking: Reported on 06/05/2018), Disp: 30 tablet, Rfl: 0 .  clopidogrel (PLAVIX) 75 MG tablet, Take 1 tablet (75 mg total) by mouth daily., Disp: 30 tablet, Rfl: 11 .  co-enzyme Q-10 30 MG capsule, Take 30 mg by mouth 3 (three) times daily.  , Disp: , Rfl:  .  doxycycline (VIBRAMYCIN) 100 MG capsule, Take 1 capsule (100 mg total) by mouth 2 (two) times daily., Disp: 20 capsule, Rfl: 0 .  fluticasone (FLONASE) 50 MCG/ACT nasal spray, Place 2 sprays into both nostrils daily., Disp: , Rfl:  .  furosemide (LASIX) 20 MG tablet, Take by mouth., Disp: , Rfl:  .  iron polysaccharides (NU-IRON) 150 MG capsule, Take by mouth., Disp: , Rfl:  .  lidocaine (XYLOCAINE) 5 % ointment, Apply to affected area as needed, Disp: , Rfl:  .  Linaclotide (LINZESS) 145 MCG CAPS capsule, Take 145 mcg by mouth daily., Disp: , Rfl:  .  Magnesium 500 MG TABS, Take 1 tablet by mouth daily.  , Disp: , Rfl:  .  methocarbamol (ROBAXIN) 500 MG tablet, Take by mouth., Disp: , Rfl:  .  Multiple Vitamin (MULTI-VITAMINS) TABS, Take by mouth., Disp: , Rfl:  .  Olopatadine HCl (PATADAY) 0.2 % SOLN, Apply 1 drop to eye daily., Disp: , Rfl:  .  oxyCODONE-acetaminophen (  PERCOCET) 5-325 MG tablet, Take 1-2 tablets by mouth every 6 (six) hours as needed., Disp: 12 tablet, Rfl: 0 .  promethazine-dextromethorphan (PROMETHAZINE-DM) 6.25-15 MG/5ML syrup, Take by mouth., Disp: , Rfl:  .  triamcinolone cream (KENALOG) 0.1 %, MIX WITH CETAPHIL MOISTURIZING CREAM. APPLY THREE TIMES DAILY AS NEEDED, Disp: , Rfl:  .  vitamin C (ASCORBIC ACID) 500 MG tablet, Take 500 mg by mouth., Disp: , Rfl:   Allergies  Allergen Reactions  . Cyclobenzaprine Swelling  . Amitriptyline Hcl     REACTION: throat swelling  . Aspirin Other (See Comments)    REACTION: stomach upset  . Citalopram Other (See Comments)  .  Escitalopram Oxalate     REACTION: Hallucinations  . Escitalopram Oxalate     REACTION: Hallucinations  . Gabapentin     GI Upset   . Hydrocodone-Acetaminophen     REACTION: stomach upset  . Lorazepam     REACTION: Throat Swelling  . Oxycodone Other (See Comments)    Headache, change in mental status  . Oxycodone-Acetaminophen     REACTION: Nausea and upset stomach  . Terfenadine     REACTION: stomach upset  . Tramadol Nausea And Vomiting  . Codeine Nausea Only    REACTION: stomach upset REACTION: stomach upset REACTION: stomach upset  . Lactulose Other (See Comments)    Passed out Passed out   . Latex Rash    REACTION: rash REACTION: rash REACTION: rash  . Levofloxacin Nausea And Vomiting and Nausea Only  . Moxifloxacin Nausea And Vomiting and Nausea Only  . Pantoprazole Other (See Comments) and Nausea Only  . Polyethylene Glycol 3350 Other (See Comments)    Makes her bloated Makes her bloated   . Rabeprazole Other (See Comments) and Nausea Only    Objective:  Vascular Examination: Capillary refill time less than 3 seconds x 10 digits  Dorsalis pedis and Posterior tibial pulses palpable b/l  Digital hair sparse x 10 digits  Skin temperature gradient WNL b/l  Bilateral lower extremity edema noted.  No pain with calf compression.  No edema, no erythema.  Dermatological Examination: Skin with normal turgor, texture and tone b/l  Toenails 2-5 b/l discolored, thick, dystrophic with subungual debris and pain with palpation to nailbeds due to thickness of nails.  Anonychia noted bilateral great toes.  Nailbeds are completely epithelialized.  Musculoskeletal: Muscle strength 5/5 to all LE muscle groups.  Pes planus foot deformity bilaterally  No pain, crepitus or joint limitation noted with ROM.   Neurological: Sensation intact with 10 gram monofilament.  Vibratory sensation intact.  Assessment: Painful onychomycosis toenails 1-5 b/l  Pes planus foot  deformity bilaterally Plantar fasciitis bilaterally Chronic venous insufficiency with lower extremity edema bilaterally  Plan: 1. Toenails 1-5 b/l were debrided in length and girth without iatrogenic bleeding. 2. Patient to continue soft, supportive shoe gear.  For her pes planus foot deformity and plantar fasciitis, we will check her insurance benefits for orthotics and call and inform her.  She will be contacted by our orthotics and prosthetics department. 3. Patient to report any pedal injuries to medical professional immediately. 4. Follow up 3 months.  5. Patient/POA to call should there be a concern in the interim.

## 2019-04-14 ENCOUNTER — Ambulatory Visit: Payer: Medicare Other | Admitting: Podiatry

## 2019-04-14 ENCOUNTER — Encounter: Payer: Self-pay | Admitting: Podiatry

## 2019-04-14 ENCOUNTER — Other Ambulatory Visit: Payer: Self-pay

## 2019-04-14 VITALS — Temp 97.5°F

## 2019-04-14 DIAGNOSIS — M2141 Flat foot [pes planus] (acquired), right foot: Secondary | ICD-10-CM

## 2019-04-14 DIAGNOSIS — M79674 Pain in right toe(s): Secondary | ICD-10-CM | POA: Diagnosis not present

## 2019-04-14 DIAGNOSIS — M2142 Flat foot [pes planus] (acquired), left foot: Secondary | ICD-10-CM

## 2019-04-14 DIAGNOSIS — M79675 Pain in left toe(s): Secondary | ICD-10-CM | POA: Diagnosis not present

## 2019-04-14 DIAGNOSIS — B351 Tinea unguium: Secondary | ICD-10-CM

## 2019-04-14 NOTE — Patient Instructions (Addendum)

## 2019-04-14 NOTE — Progress Notes (Signed)
Subjective: Angela Wang presents today with painful, thick toenails 1-5 b/l that she cannot cut and which interfere with daily activities.  Pain is aggravated when wearing enclosed shoe gear.  [place], [place], [place] is her PCP and last visit was 01/27/2019.  Pt voices b/l foot pain which she has had for a while. She has a prescription for orthotics from her Orthopedic surgeon which she has yet to have done in [facility].   Current Outpatient Medications:  .  acetaminophen-codeine (TYLENOL #3) 300-30 MG tablet, Take 1-2 tablets by mouth every 8 (eight) hours as needed for moderate pain., Disp: 30 tablet, Rfl: 0 .  acetaminophen-codeine (TYLENOL #3) 300-30 MG tablet, Take 1-2 tablets by mouth every 6 (six) hours as needed for moderate pain. (Patient not taking: Reported on 06/05/2018), Disp: 30 tablet, Rfl: 0 .  acetaminophen-codeine (TYLENOL #3) 300-30 MG tablet, Take 1 tablet by mouth 2 (two) times daily as needed for moderate pain., Disp: 30 tablet, Rfl: 0 .  acetaminophen-codeine (TYLENOL #3) 300-30 MG tablet, Take 1 tablet by mouth every 8 (eight) hours as needed for moderate pain., Disp: 12 tablet, Rfl: 0 .  ALPRAZolam (XANAX) 0.5 MG tablet, Take 0.5 mg by mouth 3 (three) times daily as needed for sleep or anxiety., Disp: , Rfl:  .  amoxicillin (AMOXIL) 875 MG tablet, , Disp: , Rfl:  .  amoxicillin-clavulanate (AUGMENTIN) 500-125 MG tablet, Take 1 tablet (500 mg total) by mouth every 8 (eight) hours., Disp: 21 tablet, Rfl: 0 .  B Complex Vitamins (B COMPLEX 100 PO), Take 1 tablet by mouth daily.  , Disp: , Rfl:  .  bisacodyl (DULCOLAX) 5 MG EC tablet, Take 1 to 2 tablets by mouth as needed nightly, Disp: , Rfl:  .  Calcium Carbonate-Vitamin D (CALCIUM-VITAMIN D) 500-200 MG-UNIT per tablet, Take 1 tablet by mouth 3 (three) times daily.  , Disp: , Rfl:  .  Calcium-Magnesium-Vitamin D (CALCIUM 500) 500-250-200 MG-MG-UNIT TABS, Take by mouth., Disp: , Rfl:  .  cetirizine (ZYRTEC) 5 MG  tablet, Take 1 tablet (5 mg total) by mouth daily. (Patient not taking: Reported on 06/05/2018), Disp: 30 tablet, Rfl: 0 .  clopidogrel (PLAVIX) 75 MG tablet, Take 1 tablet (75 mg total) by mouth daily., Disp: 30 tablet, Rfl: 11 .  co-enzyme Q-10 30 MG capsule, Take 30 mg by mouth 3 (three) times daily.  , Disp: , Rfl:  .  doxycycline (VIBRAMYCIN) 100 MG capsule, Take 1 capsule (100 mg total) by mouth 2 (two) times daily., Disp: 20 capsule, Rfl: 0 .  fluticasone (FLONASE) 50 MCG/ACT nasal spray, Place 2 sprays into both nostrils daily., Disp: , Rfl:  .  furosemide (LASIX) 20 MG tablet, Take by mouth., Disp: , Rfl:  .  iron polysaccharides (NU-IRON) 150 MG capsule, Take by mouth., Disp: , Rfl:  .  lidocaine (XYLOCAINE) 5 % ointment, Apply to affected area as needed, Disp: , Rfl:  .  Linaclotide (LINZESS) 145 MCG CAPS capsule, Take 145 mcg by mouth daily., Disp: , Rfl:  .  Magnesium 500 MG TABS, Take 1 tablet by mouth daily.  , Disp: , Rfl:  .  methocarbamol (ROBAXIN) 500 MG tablet, Take by mouth., Disp: , Rfl:  .  Multiple Vitamin (MULTI-VITAMINS) TABS, Take by mouth., Disp: , Rfl:  .  Olopatadine HCl (PATADAY) 0.2 % SOLN, Apply 1 drop to eye daily., Disp: , Rfl:  .  oxyCODONE-acetaminophen (PERCOCET) 5-325 MG tablet, Take 1-2 tablets by mouth every 6 (six)  hours as needed., Disp: 12 tablet, Rfl: 0 .  promethazine-dextromethorphan (PROMETHAZINE-DM) 6.25-15 MG/5ML syrup, Take by mouth., Disp: , Rfl:  .  triamcinolone cream (KENALOG) 0.1 %, MIX WITH CETAPHIL MOISTURIZING CREAM. APPLY THREE TIMES DAILY AS NEEDED, Disp: , Rfl:  .  vitamin C (ASCORBIC ACID) 500 MG tablet, Take 500 mg by mouth., Disp: , Rfl:   Allergies  Allergen Reactions  . Cyclobenzaprine Swelling  . Amitriptyline Hcl     REACTION: throat swelling  . Aspirin Other (See Comments)    REACTION: stomach upset  . Citalopram Other (See Comments)  . Escitalopram Oxalate     REACTION: Hallucinations  . Escitalopram Oxalate      REACTION: Hallucinations  . Gabapentin     GI Upset   . Hydrocodone-Acetaminophen     REACTION: stomach upset  . Lorazepam     REACTION: Throat Swelling  . Oxycodone Other (See Comments)    Headache, change in mental status  . Oxycodone-Acetaminophen     REACTION: Nausea and upset stomach  . Terfenadine     REACTION: stomach upset  . Tramadol Nausea And Vomiting  . Codeine Nausea Only    REACTION: stomach upset REACTION: stomach upset REACTION: stomach upset  . Lactulose Other (See Comments)    Passed out Passed out   . Latex Rash    REACTION: rash REACTION: rash REACTION: rash  . Levofloxacin Nausea And Vomiting and Nausea Only  . Moxifloxacin Nausea And Vomiting and Nausea Only  . Pantoprazole Other (See Comments) and Nausea Only  . Polyethylene Glycol 3350 Other (See Comments)    Makes her bloated Makes her bloated   . Rabeprazole Other (See Comments) and Nausea Only    Objective: Vitals:   04/14/19 0905  Temp: (!) 97.5 F (36.4 C)   Vascular Examination: Capillary refill time <3 seconds x 10 digits  Dorsalis pedis and Posterior tibial pulses palpable b/l.  Digital hair sparse x 10 digits.  Skin temperature gradient WNL b/l.  Dermatological Examination: Skin with normal turgor, texture and tone b/l.  Toenails 2-5 b/l discolored, thick, dystrophic with subungual debris and pain with palpation to nailbeds due to thickness of nails.  Anonychia bilateral great toe(s) with evidence of permanent total nail avulsion. Nailbed(s) completely epithelialized and intact. No erythema, no edema, no drainage, no flocculence.  Musculoskeletal: Muscle strength 5/5 to all LE muscle groups  Pes planus foot deformity LLE. Normal arch noted right foot.  No pain, crepitus or joint limitation noted with ROM.   Neurological: Sensation intact with 10 gram monofilament.  Vibratory sensation intact.  Assessment: Painful onychomycosis toenails 2-5 b/l  Anonychia b/l  great toes  Plan: 1. Toenails 1-5 b/l were debrided in length and girth without iatrogenic bleeding. 2. She has Rx from Orthopedic surgeon to get orthotics. I encouraged her to get her prescription fulfilled as they would help resolve her pain symptoms. 3. Patient to continue soft, supportive shoe gear daily. 4. Patient to report any pedal injuries to medical professional immediately. 5. Follow up 3 months.  6. Patient/POA to call should there be a concern in the interim.

## 2019-05-22 ENCOUNTER — Other Ambulatory Visit: Payer: Self-pay | Admitting: Obstetrics and Gynecology

## 2019-05-22 DIAGNOSIS — Z1231 Encounter for screening mammogram for malignant neoplasm of breast: Secondary | ICD-10-CM

## 2019-05-28 ENCOUNTER — Ambulatory Visit
Admission: RE | Admit: 2019-05-28 | Discharge: 2019-05-28 | Disposition: A | Discharge status: Home / Self Care | Payer: Medicare Other | Source: Ambulatory Visit | Attending: Obstetrics and Gynecology | Admitting: Obstetrics and Gynecology

## 2019-05-28 ENCOUNTER — Other Ambulatory Visit: Payer: Self-pay

## 2019-05-28 DIAGNOSIS — Z1231 Encounter for screening mammogram for malignant neoplasm of breast: Secondary | ICD-10-CM

## 2019-06-02 ENCOUNTER — Other Ambulatory Visit: Payer: Self-pay | Admitting: Obstetrics and Gynecology

## 2019-06-02 DIAGNOSIS — R928 Other abnormal and inconclusive findings on diagnostic imaging of breast: Secondary | ICD-10-CM

## 2019-06-04 ENCOUNTER — Ambulatory Visit
Admission: RE | Admit: 2019-06-04 | Discharge: 2019-06-04 | Disposition: A | Discharge status: Home / Self Care | Payer: Medicare Other | Source: Ambulatory Visit | Attending: Obstetrics and Gynecology | Admitting: Obstetrics and Gynecology

## 2019-06-04 ENCOUNTER — Ambulatory Visit: Payer: Medicare Other

## 2019-06-04 ENCOUNTER — Other Ambulatory Visit: Payer: Self-pay

## 2019-06-04 DIAGNOSIS — R928 Other abnormal and inconclusive findings on diagnostic imaging of breast: Secondary | ICD-10-CM

## 2019-06-23 ENCOUNTER — Encounter: Payer: Self-pay | Admitting: Podiatry

## 2019-06-23 ENCOUNTER — Ambulatory Visit: Payer: Medicare Other | Admitting: Podiatry

## 2019-06-23 ENCOUNTER — Ambulatory Visit (INDEPENDENT_AMBULATORY_CARE_PROVIDER_SITE_OTHER): Payer: Medicare Other

## 2019-06-23 ENCOUNTER — Other Ambulatory Visit: Payer: Self-pay

## 2019-06-23 VITALS — Temp 98.3°F

## 2019-06-23 DIAGNOSIS — R609 Edema, unspecified: Secondary | ICD-10-CM

## 2019-06-23 DIAGNOSIS — R2681 Unsteadiness on feet: Secondary | ICD-10-CM | POA: Diagnosis not present

## 2019-06-23 DIAGNOSIS — M79674 Pain in right toe(s): Secondary | ICD-10-CM

## 2019-06-23 DIAGNOSIS — I872 Venous insufficiency (chronic) (peripheral): Secondary | ICD-10-CM

## 2019-06-23 DIAGNOSIS — M79675 Pain in left toe(s): Secondary | ICD-10-CM | POA: Diagnosis not present

## 2019-06-23 DIAGNOSIS — M779 Enthesopathy, unspecified: Secondary | ICD-10-CM | POA: Diagnosis not present

## 2019-06-23 DIAGNOSIS — B351 Tinea unguium: Secondary | ICD-10-CM

## 2019-06-29 ENCOUNTER — Ambulatory Visit: Payer: Medicare Other | Admitting: Orthotics

## 2019-06-29 ENCOUNTER — Other Ambulatory Visit: Payer: Self-pay

## 2019-06-29 DIAGNOSIS — R2681 Unsteadiness on feet: Secondary | ICD-10-CM

## 2019-06-29 DIAGNOSIS — M6788 Other specified disorders of synovium and tendon, other site: Secondary | ICD-10-CM

## 2019-06-29 DIAGNOSIS — M79674 Pain in right toe(s): Secondary | ICD-10-CM

## 2019-06-29 DIAGNOSIS — B351 Tinea unguium: Secondary | ICD-10-CM

## 2019-06-29 NOTE — Progress Notes (Signed)
Patient is seeing PA and is not diabetic. Patient wants to purchase 1 pair of shoes and 1 pair of f/o:  $260.

## 2019-06-30 NOTE — Progress Notes (Signed)
Subjective: This 85 year old female presents the office today for concerns of bilateral foot pain which is been a chronic issue for her for several years.  She states she gets swelling her feet feel "tight".  She also describes her left ankle as "weak".  She denies any recent injury or falls.  She feels the swelling has been ongoing since 2015 after she had a knee replacement.  She previously had x-rays and she does not want new x-rays today.  She is also asking for the nails today.  Denies any systemic complaints such as fevers, chills, nausea, vomiting. No acute changes since last appointment, and no other complaints at this time.   Objective: AAO x3, NAD DP/PT pulses palpable bilaterally, CRT less than 3 seconds Mild pitting edema present bilaterally.  There is no erythema or warmth.  There is no area pinpoint tenderness identified today.  Range of motion of the right ankle, subtalar joint appears to be intact.  Nails are hypertrophic, dystrophic, brittle, discolored, elongated 10. No surrounding redness or drainage. Tenderness nails 1-5 bilaterally. No open lesions or pre-ulcerative lesions are identified today.  No pain with calf compression, swelling, warmth, erythema  Assessment: Bilateral chronic edema/pain; symptomatic onychomycosis  Plan: -All treatment options discussed with the patient including all alternatives, risks, complications.  -She declined new x-rays today.  She states that she was previously on Furosamide and now she is on lasix. She believes she did better with the furosamide. She is going to contact her PCP. Does not sound like she takes the medication regularly.  -Compression stockings to help with the swelling -PT ordered- prescription written for [employer] [facility] -Nails debrided O96 without complications or bleeding. -Patient encouraged to call the office with any questions, concerns, change in symptoms.   Trula Slade DPM

## 2019-07-21 ENCOUNTER — Ambulatory Visit: Payer: Self-pay | Admitting: Podiatry

## 2019-07-21 ENCOUNTER — Ambulatory Visit: Payer: Medicare Other | Admitting: Orthotics

## 2019-07-21 ENCOUNTER — Other Ambulatory Visit: Payer: Self-pay

## 2019-07-21 ENCOUNTER — Encounter: Payer: Self-pay | Admitting: Podiatry

## 2019-07-21 VITALS — Temp 97.9°F

## 2019-07-21 DIAGNOSIS — M79676 Pain in unspecified toe(s): Secondary | ICD-10-CM

## 2019-07-21 DIAGNOSIS — M79674 Pain in right toe(s): Secondary | ICD-10-CM

## 2019-07-21 DIAGNOSIS — M6788 Other specified disorders of synovium and tendon, other site: Secondary | ICD-10-CM

## 2019-07-21 DIAGNOSIS — R2681 Unsteadiness on feet: Secondary | ICD-10-CM

## 2019-07-21 DIAGNOSIS — B351 Tinea unguium: Secondary | ICD-10-CM

## 2019-07-21 DIAGNOSIS — I872 Venous insufficiency (chronic) (peripheral): Secondary | ICD-10-CM

## 2019-07-21 DIAGNOSIS — M79675 Pain in left toe(s): Secondary | ICD-10-CM

## 2019-07-21 NOTE — Patient Instructions (Signed)

## 2019-07-21 NOTE — Progress Notes (Signed)
Patient picked up DBS and inserts.Marland Kitchenself pay 250

## 2019-07-29 NOTE — Progress Notes (Signed)
Subjective:  Angela Wang presents to clinic today with cc of  painful, thick, discolored, elongated toenails 1-5 b/l that become tender and cannot cut because of thickness. Pain is aggravated when wearing enclosed shoe gear.  She has porokeratosis on lateral aspect of left foot which she does not want debrided on today.  [place], [place], PA-C is her PCP.    Current Outpatient Medications:  .  furosemide (LASIX) 20 MG tablet, Take by mouth., Disp: , Rfl:  .  acetaminophen-codeine (TYLENOL #3) 300-30 MG tablet, Take 1 tablet by mouth every 8 (eight) hours as needed for moderate pain., Disp: 12 tablet, Rfl: 0 .  ALPRAZolam (XANAX) 0.5 MG tablet, Take 0.5 mg by mouth 3 (three) times daily as needed for sleep or anxiety., Disp: , Rfl:  .  amoxicillin (AMOXIL) 875 MG tablet, , Disp: , Rfl:  .  amoxicillin-clavulanate (AUGMENTIN) 875-125 MG tablet, , Disp: , Rfl:  .  B Complex Vitamins (B COMPLEX 100 PO), Take 1 tablet by mouth daily.  , Disp: , Rfl:  .  bisacodyl (DULCOLAX) 5 MG EC tablet, Take 1 to 2 tablets by mouth as needed nightly, Disp: , Rfl:  .  Calcium Carbonate-Vitamin D (CALCIUM-VITAMIN D) 500-200 MG-UNIT per tablet, Take 1 tablet by mouth 3 (three) times daily.  , Disp: , Rfl:  .  Calcium-Magnesium-Vitamin D (CALCIUM 500) 500-250-200 MG-MG-UNIT TABS, Take by mouth., Disp: , Rfl:  .  co-enzyme Q-10 30 MG capsule, Take 30 mg by mouth 3 (three) times daily.  , Disp: , Rfl:  .  fluticasone (FLONASE) 50 MCG/ACT nasal spray, Place 2 sprays into both nostrils daily., Disp: , Rfl:  .  iron polysaccharides (NU-IRON) 150 MG capsule, Take by mouth., Disp: , Rfl:  .  lidocaine (XYLOCAINE) 5 % ointment, Apply to affected area as needed, Disp: , Rfl:  .  Linaclotide (LINZESS) 145 MCG CAPS capsule, Take 145 mcg by mouth daily., Disp: , Rfl:  .  Olopatadine HCl (PATADAY) 0.2 % SOLN, Apply 1 drop to eye daily., Disp: , Rfl:  .  promethazine-dextromethorphan (PROMETHAZINE-DM) 6.25-15 MG/5ML  syrup, Take by mouth., Disp: , Rfl:  .  triamcinolone cream (KENALOG) 0.1 %, MIX WITH CETAPHIL MOISTURIZING CREAM. APPLY THREE TIMES DAILY AS NEEDED, Disp: , Rfl:  .  vitamin C (ASCORBIC ACID) 500 MG tablet, Take 500 mg by mouth., Disp: , Rfl:    Allergies  Allergen Reactions  . Cyclobenzaprine Swelling  . Amitriptyline Hcl     REACTION: throat swelling  . Aspirin Other (See Comments)    REACTION: stomach upset  . Citalopram Other (See Comments)  . Escitalopram Oxalate     REACTION: Hallucinations  . Escitalopram Oxalate     REACTION: Hallucinations  . Gabapentin     GI Upset   . Hydrocodone-Acetaminophen     REACTION: stomach upset  . Lorazepam     REACTION: Throat Swelling  . Oxycodone Other (See Comments)    Headache, change in mental status  . Oxycodone-Acetaminophen     REACTION: Nausea and upset stomach  . Terfenadine     REACTION: stomach upset  . Tramadol Nausea And Vomiting  . Codeine Nausea Only    REACTION: stomach upset REACTION: stomach upset REACTION: stomach upset  . Lactulose Other (See Comments)    Passed out Passed out   . Latex Rash    REACTION: rash REACTION: rash REACTION: rash  . Levofloxacin Nausea And Vomiting and Nausea Only  . Moxifloxacin Nausea And Vomiting  and Nausea Only  . Pantoprazole Other (See Comments) and Nausea Only  . Polyethylene Glycol 3350 Other (See Comments)    Makes her bloated Makes her bloated   . Rabeprazole Other (See Comments) and Nausea Only     Objective: Vitals:   07/21/19 0851  Temp: 97.9 F (36.6 C)    Physical Examination:  Vascular Examination: Capillary refill time <3 seconds x 10 digits.  Palpable DP/PT pulses b/l.  Digital hair remains sparse b/l.  No edema noted b/l.  Skin temperature gradient WNL b/l.  Dermatological Examination: Skin with normal turgor, texture and tone b/l.  No open wounds b/l.  No interdigital macerations noted b/l.  Elongated, thick, discolored brittle  toenails with subungual debris and pain on dorsal palpation of nailbeds 2-5 b/l.  Anonychia b/l great  toe(s) with evidence of permanent total nail avulsion. Nailbed(s) completely epithelialized and intact.  Hyperkeratotic lesion distal tip left hallux with tenderness to palpation. No edema, no erythema, no drainage, no flocculence. Porokeratotic lesion lateral aspect left foot. No erythema, no edema, no drainage.  Musculoskeletal Examination: Muscle strength 5/5 to all muscle groups b/l.  Pes planus foot deformity LLE.  No pain, crepitus or joint discomfort with active/passive ROM.  Neurological Examination: Sensation intact 5/5 b/l with 10 gram monofilament.  Vibratory sensation intact b/l.  Proprioceptive sensation intact b/l.  Assessment: Mycotic nail infection with pain 2-5 b/l  Plan: 1. Toenails 2-5 b/l were debrided in length and girth without iatrogenic laceration. 2.  Continue soft, supportive shoe gear daily. 3.  Report any pedal injuries to medical professional. 4.  Follow up 10 weeks. 5.  Patient/POA to call should there be a question/concern in there interim.

## 2019-08-04 ENCOUNTER — Other Ambulatory Visit: Payer: Self-pay

## 2019-08-04 ENCOUNTER — Ambulatory Visit: Payer: Medicare Other | Admitting: Orthotics

## 2019-08-04 DIAGNOSIS — R2681 Unsteadiness on feet: Secondary | ICD-10-CM

## 2019-08-04 DIAGNOSIS — M6788 Other specified disorders of synovium and tendon, other site: Secondary | ICD-10-CM

## 2019-08-04 NOTE — Progress Notes (Signed)
Exchanging shoes for [id number]

## 2019-08-06 ENCOUNTER — Ambulatory Visit (INDEPENDENT_AMBULATORY_CARE_PROVIDER_SITE_OTHER): Payer: Medicare Other

## 2019-08-06 ENCOUNTER — Other Ambulatory Visit: Payer: Self-pay

## 2019-08-06 ENCOUNTER — Encounter: Payer: Self-pay | Admitting: Podiatry

## 2019-08-06 ENCOUNTER — Ambulatory Visit (INDEPENDENT_AMBULATORY_CARE_PROVIDER_SITE_OTHER): Payer: Medicare Other | Admitting: Podiatry

## 2019-08-06 DIAGNOSIS — M779 Enthesopathy, unspecified: Secondary | ICD-10-CM

## 2019-08-06 DIAGNOSIS — M2142 Flat foot [pes planus] (acquired), left foot: Secondary | ICD-10-CM

## 2019-08-06 DIAGNOSIS — M2141 Flat foot [pes planus] (acquired), right foot: Secondary | ICD-10-CM | POA: Diagnosis not present

## 2019-08-06 DIAGNOSIS — I872 Venous insufficiency (chronic) (peripheral): Secondary | ICD-10-CM

## 2019-08-06 DIAGNOSIS — M2560 Stiffness of unspecified joint, not elsewhere classified: Secondary | ICD-10-CM | POA: Diagnosis not present

## 2019-08-06 NOTE — Patient Instructions (Signed)
Keep physical therapy going You can apply voltaren gel (diclofenac gel) to your feet daily

## 2019-08-16 NOTE — Progress Notes (Signed)
Subjective: 85 year old female presents the office today for follow-up evaluation of bilateral chronic lower extremity pain, edema.  She states that she is not taking any fluid pills at this time.  She does state that physical therapy has been helpful to get some discomfort but overall improving.  She describing more stiffness as opposed to discomfort. Denies any systemic complaints such as fevers, chills, nausea, vomiting. No acute changes since last appointment, and no other complaints at this time.   Objective: AAO x3, NAD DP/PT pulses palpable bilaterally, CRT less than 3 seconds There is bilateral chronic lower extremity edema with mild pitting present.  Is no erythema or warmth there is no pain with calf compression, erythema or warmth.  On today's exam I am not able to elicit any area pinpoint tenderness.  Equinus is present.  No open lesions or pre-ulcerative lesions.  No pain with calf compression, swelling, warmth, erythema  Assessment: 85 year old female bilateral lower extremity pain but there is also tenderness/osteoarthritis with pitting edema  Plan: -All treatment options discussed with the patient including all alternatives, risks, complications.  -Plan to continue physical therapy as this is been helpful and she agrees.  She is Voltaren as needed.  Recommend follow-up with her primary care physician regards to the swelling. -Patient encouraged to call the office with any questions, concerns, change in symptoms.   Trula Slade DPM

## 2019-08-24 ENCOUNTER — Other Ambulatory Visit: Payer: Self-pay

## 2019-08-24 ENCOUNTER — Ambulatory Visit: Payer: Medicare Other | Admitting: Orthotics

## 2019-08-24 DIAGNOSIS — I872 Venous insufficiency (chronic) (peripheral): Secondary | ICD-10-CM

## 2019-08-24 DIAGNOSIS — M779 Enthesopathy, unspecified: Secondary | ICD-10-CM

## 2019-08-24 DIAGNOSIS — M2141 Flat foot [pes planus] (acquired), right foot: Secondary | ICD-10-CM

## 2019-08-24 DIAGNOSIS — R2681 Unsteadiness on feet: Secondary | ICD-10-CM

## 2019-08-24 NOTE — Progress Notes (Signed)
Picked up reordered shoes.

## 2019-09-03 ENCOUNTER — Other Ambulatory Visit: Payer: Self-pay

## 2019-09-03 ENCOUNTER — Ambulatory Visit: Payer: Medicare Other | Admitting: Orthotics

## 2019-09-03 DIAGNOSIS — M2141 Flat foot [pes planus] (acquired), right foot: Secondary | ICD-10-CM

## 2019-09-03 DIAGNOSIS — M779 Enthesopathy, unspecified: Secondary | ICD-10-CM

## 2019-09-03 DIAGNOSIS — I872 Venous insufficiency (chronic) (peripheral): Secondary | ICD-10-CM

## 2019-09-03 DIAGNOSIS — R2681 Unsteadiness on feet: Secondary | ICD-10-CM

## 2019-09-03 NOTE — Progress Notes (Signed)
reording man's shoe that is deeper and bit wider to accommodate foot deformity; also added more ppt cushioning to f/o.

## 2019-09-07 ENCOUNTER — Ambulatory Visit: Payer: Medicare Other | Admitting: Physician Assistant

## 2019-09-07 ENCOUNTER — Other Ambulatory Visit (INDEPENDENT_AMBULATORY_CARE_PROVIDER_SITE_OTHER): Payer: Medicare Other

## 2019-09-07 ENCOUNTER — Encounter: Payer: Self-pay | Admitting: Physician Assistant

## 2019-09-07 ENCOUNTER — Other Ambulatory Visit: Payer: Self-pay

## 2019-09-07 VITALS — BP 122/68 | HR 80 | Temp 97.3°F | Ht 66.5 in | Wt 180.0 lb

## 2019-09-07 DIAGNOSIS — K219 Gastro-esophageal reflux disease without esophagitis: Secondary | ICD-10-CM | POA: Diagnosis not present

## 2019-09-07 DIAGNOSIS — R1084 Generalized abdominal pain: Secondary | ICD-10-CM

## 2019-09-07 DIAGNOSIS — R194 Change in bowel habit: Secondary | ICD-10-CM

## 2019-09-07 LAB — CBC WITH DIFFERENTIAL/PLATELET
Basophils Absolute: 0.1 10*3/uL (ref 0.0–0.1)
Basophils Relative: 1.6 % (ref 0.0–3.0)
Eosinophils Absolute: 0.2 10*3/uL (ref 0.0–0.7)
Eosinophils Relative: 3.2 % (ref 0.0–5.0)
HCT: 36.2 % (ref 36.0–46.0)
Hemoglobin: 11.3 g/dL — ABNORMAL LOW (ref 12.0–15.0)
Lymphocytes Relative: 39.2 % (ref 12.0–46.0)
Lymphs Abs: 2.2 10*3/uL (ref 0.7–4.0)
MCHC: 31.3 g/dL (ref 30.0–36.0)
MCV: 82.7 fl (ref 78.0–100.0)
Monocytes Absolute: 0.6 10*3/uL (ref 0.1–1.0)
Monocytes Relative: 10.8 % (ref 3.0–12.0)
Neutro Abs: 2.5 10*3/uL (ref 1.4–7.7)
Neutrophils Relative %: 45.2 % (ref 43.0–77.0)
Platelets: 203 10*3/uL (ref 150.0–400.0)
RBC: 4.38 Mil/uL (ref 3.87–5.11)
RDW: 15.3 % (ref 11.5–15.5)
WBC: 5.5 10*3/uL (ref 4.0–10.5)

## 2019-09-07 LAB — COMPREHENSIVE METABOLIC PANEL
ALT: 9 U/L (ref 0–35)
AST: 17 U/L (ref 0–37)
Albumin: 4 g/dL (ref 3.5–5.2)
Alkaline Phosphatase: 56 U/L (ref 39–117)
BUN: 14 mg/dL (ref 6–23)
CO2: 29 mEq/L (ref 19–32)
Calcium: 9.8 mg/dL (ref 8.4–10.5)
Chloride: 105 mEq/L (ref 96–112)
Creatinine, Ser: 0.82 mg/dL (ref 0.40–1.20)
GFR: 79.89 mL/min (ref >60.00)
Glucose, Bld: 91 mg/dL (ref 70–99)
Potassium: 4 mEq/L (ref 3.5–5.1)
Sodium: 141 mEq/L (ref 135–145)
Total Bilirubin: 0.4 mg/dL (ref 0.2–1.2)
Total Protein: 7.9 g/dL (ref 6.0–8.3)

## 2019-09-07 LAB — LIPASE: Lipase: 81 U/L — ABNORMAL HIGH (ref 11.0–59.0)

## 2019-09-07 MED ORDER — LINACLOTIDE 72 MCG PO CAPS: 72.0000 ug | ORAL_CAPSULE | Freq: Every day | ORAL | 3 refills | Status: DC

## 2019-09-07 MED ORDER — OMEPRAZOLE 40 MG PO CPDR: 40.0000 mg | DELAYED_RELEASE_CAPSULE | ORAL | 3 refills | Status: DC

## 2019-09-07 NOTE — Patient Instructions (Addendum)
If you are age 83 or older, your body mass index should be between 23-30. Your Body mass index is 28.62 kg/m. If this is out of the aforementioned range listed, please consider follow up with your Primary Care Provider.  If you are age 53 or younger, your body mass index should be between 19-25. Your Body mass index is 28.62 kg/m. If this is out of the aformentioned range listed, please consider follow up with your Primary Care Provider.   Your provider has requested that you go to the basement level for lab work before leaving today. Press "B" on the elevator. The lab is located at the first door on the left as you exit the elevator.  We have sent the following medications to your pharmacy for you to pick up at your convenience: Linzess 72 mcg - you have been given samples. Omeprazole 40 mg  STOP Linzess 145 mcg  You have been scheduled for a CT scan of the abdomen and pelvis at [facility] ([address]---this is in the same building as [employer]).   You are scheduled on 09/11/19 at 10:30 am. You should arrive 15 minutes prior to your appointment time for registration. Please follow the written instructions below on the day of your exam:  WARNING: IF YOU ARE ALLERGIC TO IODINE/X-RAY DYE, PLEASE NOTIFY RADIOLOGY IMMEDIATELY AT [phone]! YOU WILL BE GIVEN A 13 HOUR PREMEDICATION PREP.  1) Do not eat or drink anything after 6:30 am (4 hours prior to your test) 2) You have been given 2 bottles of oral contrast to drink. The solution may taste better if refrigerated, but do NOT add ice or any other liquid to this solution. Shake well before drinking.    Drink 1 bottle of contrast @ 8:30 am (2 hours prior to your exam)  Drink 1 bottle of contrast @ 9:30 am (1 hour prior to your exam)  You may take any medications as prescribed with a small amount of water, if necessary. If you take any of the following medications: METFORMIN, GLUCOPHAGE, GLUCOVANCE, AVANDAMET,  RIOMET, FORTAMET, [place] MET, JANUMET, GLUMETZA or METAGLIP, you MAY be asked to HOLD this medication 48 hours AFTER the exam.  The purpose of you drinking the oral contrast is to aid in the visualization of your intestinal tract. The contrast solution may cause some diarrhea. Depending on your individual set of symptoms, you may also receive an intravenous injection of x-ray contrast/dye. Plan on being at [facility] for 30 minutes or longer, depending on the type of exam you are having performed.  This test typically takes 30-45 minutes to complete.  If you have any questions regarding your exam or if you need to reschedule, you may call the CT department at [phone] between the hours of 8:00 am and 5:00 pm, Monday-Friday.  _____________________________________________________________________  Follow up with me in 3-4 weeks.  Please call the office in a couple of weeks to schedule an appointment as the schedule is not available at this time.  Thank you for choosing me and [facility].    Ellouise Newer, PA-C

## 2019-09-07 NOTE — Progress Notes (Signed)
Chief Complaint: Abdominal pain, constipation, GERD  HPI:    [name] is an 85 year old AA female with a past medical history as listed below, previously known to Dr. Olevia Perches, who was referred to me by Belva Bertin, [place], * for a complaint of abdominal pain, constipation, GERD.      01/23/2010 colonoscopy for hematochezia and an aunt with colon cancer.  Mild diverticulosis in the ascending colon and melanosis, otherwise normal.    Today, the patient presents to clinic with a myriad of GI complaints but most concerning to her is a right upper quadrant pain which she tells me is shooting at times and can go down to her navel.  Rated as a 7-8/10. It is more uncomfortable when she is bending to her right side and feels like something is "choked in there".  It can occur at anytime of the day and does not seem worse with eating.  Also can sometimes feel like something is "bursting in my side".  Describes a history of being diagnosed with "spasmatic colon".  Apparently has had this pain for "years", seems to be ever since she had her gallbladder out.    Along with this describes symptoms of heartburn and reflux 2 to 3 days a week with occasional feeling of "a swelling in my throat".      Also constipation for which she uses Metamucil on a daily basis and occasionally Dulcolax.  Only has a bowel movement every other day and does not always feel completely empty.  Does tell me she has Linzess 145 mcg but this "cleans me out", so I only use it as needed.  This does seem worse recently.    Denies fever, chills, weight loss, anorexia, nausea, vomiting or symptoms that awaken her from sleep.  Past Medical History:  Diagnosis Date  . ALLERGIC RHINITIS 06/21/2007   Qualifier: Diagnosis of  By: Wynona Luna   . Allergy    allergic rhinitis  . Anemia    nos  . ANEMIA-NOS 06/21/2007   Qualifier: Diagnosis of  By: Wynona Luna   . ANXIETY 06/21/2007   Qualifier: Diagnosis of  By: Wynona Luna    . Arthritis    osteoarthritis  . Atypical chest pain 09/2008   negative myoview  . BACK PAIN, LUMBAR 03/06/2010   Qualifier: Diagnosis of  By: Wynona Luna   . CEREBRAL ANEURYSM 06/21/2007   Qualifier: History of  By: Wynona Luna   . CEREBROVASCULAR ACCIDENT, HX OF 06/21/2007   Qualifier: Diagnosis of  By: Wynona Luna   . CHEST PAIN, ATYPICAL 09/02/2008   Qualifier: Diagnosis of  By: Wynona Luna   . Chronic sinusitis   . CONSTIPATION, CHRONIC 09/20/2009   Qualifier: Diagnosis of  By: Olevia Perches MD, Lowella Bandy   . Depression   . DEPRESSION 06/21/2007   Qualifier: Diagnosis of  By: Wynona Luna   . Diverticulosis   . DIVERTICULOSIS-COLON 06/09/2008   Qualifier: Diagnosis of  By: Chester Holstein NP, Nevin Bloodgood    . Fibromyalgia   . FIBROMYALGIA 06/21/2007   Qualifier: Diagnosis of  By: Wynona Luna   . GASTRITIS, HX OF 08/25/2008   Qualifier: Diagnosis of  By: Nelson-Smith CMA (AAMA), Dottie    . GERD 05/03/2008   Qualifier: Diagnosis of  By: Julien Girt CMA, Marliss Czar    . GERD (gastroesophageal reflux disease)   . Headache 03/15/2011  . History of solitary pulmonary  nodule    right, stable by CT  . Hyperlipidemia   . HYPERLIPIDEMIA 06/21/2007   Qualifier: Diagnosis of  By: Wynona Luna   . Hypertension   . HYPERTENSION 06/21/2007   Qualifier: Diagnosis of  By: Wynona Luna   . HYPOGLYCEMIA 10/24/2010   Qualifier: Diagnosis of  By: Wynona Luna   . HYPOTHYROIDISM 07/22/2008   Qualifier: Diagnosis of  By: Wynona Luna   . IBS 06/21/2007   Qualifier: Diagnosis of  By: Wynona Luna   . IBS (irritable bowel syndrome)   . INSOMNIA, CHRONIC 07/19/2009   Qualifier: Diagnosis of  By: Wynona Luna   . LEG PAIN, RIGHT 02/01/2009   Qualifier: Diagnosis of  By: Wynona Luna   . Leukopenia    chronic  . LEUKOPENIA, CHRONIC 06/21/2007   Qualifier: Diagnosis of  By: Wynona Luna   . NECK PAIN 02/03/2008   Qualifier: Diagnosis of  By: Wynona Luna    . NUMBNESS 01/29/2008   Qualifier: Diagnosis of  By: Loanne Drilling MD, Jacelyn Pi   . Docia Furl NOS-Unspec 06/21/2007   Centricity Description: DEGENERATIVE JOINT DISEASE, MILD Qualifier: Diagnosis of  By: Wynona Luna  Centricity Description: OSTEOARTHRITIS Qualifier: Diagnosis of  By: Wynona Luna   . PULMONARY NODULE 12/26/2007   Qualifier: Diagnosis of  By: Wynona Luna   . Routine general medical examination at a health care facility   . SCIATICA, RIGHT 05/06/2008   Qualifier: Diagnosis of  By: Nathaneil Canary, CMA, Sarah    . Stroke ([place])   . THROMBOCYTOPENIA 02/09/2009   Qualifier: Diagnosis of  By: Wynona Luna   . UNSTEADY GAIT 07/28/2009   Qualifier: Diagnosis of  By: Wynona Luna   . VENOUS INSUFFICIENCY 05/03/2008   Qualifier: Diagnosis of  By: Julien Girt CMA, Marliss Czar      Past Surgical History:  Procedure Laterality Date  . ABDOMINAL HYSTERECTOMY    . ANEURYSM COILING    . APPENDECTOMY    . CHOLECYSTECTOMY    . JOINT REPLACEMENT     total right knee replacement  . KNEE SURGERY      Current Outpatient Medications  Medication Sig Dispense Refill  . acetaminophen-codeine (TYLENOL #3) 300-30 MG tablet Take 1 tablet by mouth every 8 (eight) hours as needed for moderate pain. 12 tablet 0  . ALPRAZolam (XANAX) 0.5 MG tablet Take 0.5 mg by mouth 3 (three) times daily as needed for sleep or anxiety.    Marland Kitchen amoxicillin (AMOXIL) 875 MG tablet     . amoxicillin-clavulanate (AUGMENTIN) 875-125 MG tablet     . B Complex Vitamins (B COMPLEX 100 PO) Take 1 tablet by mouth daily.      . bisacodyl (DULCOLAX) 5 MG EC tablet Take 1 to 2 tablets by mouth as needed nightly    . Calcium Carbonate-Vitamin D (CALCIUM-VITAMIN D) 500-200 MG-UNIT per tablet Take 1 tablet by mouth 3 (three) times daily.      . Calcium-Magnesium-Vitamin D (CALCIUM 500) 500-250-200 MG-MG-UNIT TABS Take by mouth.    . co-enzyme Q-10 30 MG capsule Take 30 mg by mouth 3 (three) times daily.      . fluticasone (FLONASE) 50  MCG/ACT nasal spray Place 2 sprays into both nostrils daily.    . furosemide (LASIX) 20 MG tablet Take by mouth.    . iron polysaccharides (NU-IRON) 150 MG capsule Take by  mouth.    . lidocaine (XYLOCAINE) 5 % ointment Apply to affected area as needed    . Linaclotide (LINZESS) 145 MCG CAPS capsule Take 145 mcg by mouth daily.    . Olopatadine HCl (PATADAY) 0.2 % SOLN Apply 1 drop to eye daily.    . promethazine-dextromethorphan (PROMETHAZINE-DM) 6.25-15 MG/5ML syrup Take by mouth.    . triamcinolone cream (KENALOG) 0.1 % MIX WITH CETAPHIL MOISTURIZING CREAM. APPLY THREE TIMES DAILY AS NEEDED    . vitamin C (ASCORBIC ACID) 500 MG tablet Take 500 mg by mouth.     No current facility-administered medications for this visit.     Allergies as of 09/07/2019 - Review Complete 08/06/2019  Allergen Reaction Noted  . Cyclobenzaprine Swelling 05/11/2012  . Amitriptyline hcl  12/13/2009  . Aspirin Other (See Comments) 07/20/2011  . Citalopram Other (See Comments) 05/11/2012  . Escitalopram oxalate  05/09/2010  . Escitalopram oxalate  10/04/2014  . Gabapentin  02/03/2016  . Hydrocodone-acetaminophen    . Lorazepam    . Oxycodone Other (See Comments) 04/21/2014  . Oxycodone-acetaminophen  05/06/2008  . Terfenadine    . Tramadol Nausea And Vomiting 01/25/2013  . Codeine Nausea Only 10/04/2014  . Lactulose Other (See Comments) 05/11/2012  . Latex Rash 10/04/2014  . Levofloxacin Nausea And Vomiting and Nausea Only 05/11/2012  . Moxifloxacin Nausea And Vomiting and Nausea Only 04/21/2014  . Pantoprazole Other (See Comments) and Nausea Only 05/11/2012  . Polyethylene glycol 3350 Other (See Comments) 05/11/2012  . Rabeprazole Other (See Comments) and Nausea Only 05/11/2012    Family History  Problem Relation Age of Onset  . Cancer Mother        pancreatic  . Coronary artery disease Father   . Cancer Sister         breast  . Asthma Sister   . Breast cancer Sister   . Cancer Brother         prostate  . Allergies Brother   . Cancer Maternal Aunt        colon  . Diabetes Other   . Cancer Other        ovarian    Social History   Socioeconomic History  . Marital status: Widowed    Spouse name: Not on file  . Number of children: 4  . Years of education: Not on file  . Highest education level: Not on file  Occupational History  . Occupation: retired    Fish farm manager: RETIRED    Comment: [employer]  . Financial resource strain: Not on file  . Food insecurity    Worry: Not on file    Inability: Not on file  . Transportation needs    Medical: Not on file    Non-medical: Not on file  Tobacco Use  . Smoking status: Never Smoker  . Smokeless tobacco: Never Used  Substance and Sexual Activity  . Alcohol use: No  . Drug use: No  . Sexual activity: Not on file  Lifestyle  . Physical activity    Days per week: Not on file    Minutes per session: Not on file  . Stress: Not on file  Relationships  . Social Herbalist on phone: Not on file    Gets together: Not on file    Attends religious service: Not on file    Active member of club or organization: Not on file    Attends meetings of clubs or organizations: Not on file  Relationship status: Not on file  . Intimate partner violence    Fear of current or ex partner: Not on file    Emotionally abused: Not on file    Physically abused: Not on file    Forced sexual activity: Not on file  Other Topics Concern  . Not on file  Social History Narrative  . Not on file    Review of Systems:    Constitutional: No weight loss, fever or chills Skin: No rash  Cardiovascular: No chest pain Respiratory: No SOB  Gastrointestinal: See HPI and otherwise negative Genitourinary: No dysuria  Neurological: No headache Musculoskeletal: +chronic foot, knee and ankle pain Hematologic: No bleeding  Psychiatric: No history of depression or anxiety   Physical Exam:  Vital signs: /BP 122/68   Pulse  80   Temp (!) 97.3 F (36.3 C)   Ht 5' 6.5" (1.689 m)   Wt 180 lb (81.6 kg)   BMI 28.62 kg/m   Constitutional:   Pleasant Elderly AA female appears to be in NAD, Well developed, Well nourished, alert and cooperative Head:  Normocephalic and atraumatic. Eyes:   PEERL, EOMI. No icterus. Conjunctiva pink. Ears:  Normal auditory acuity. Neck:  Supple Throat: Oral cavity and pharynx without inflammation, swelling or lesion.  Respiratory: Respirations even and unlabored. Lungs clear to auscultation bilaterally.   No wheezes, crackles, or rhonchi.  Cardiovascular: Normal S1, S2. No MRG. Regular rate and rhythm. No peripheral edema, cyanosis or pallor.  Gastrointestinal:  Soft, nondistended, moderate RUQ ttp. No rebound or guarding. Decreased bowel sounds. No appreciable masses or hepatomegaly. Rectal:  Not performed.  Msk:  Symmetrical without gross deformities. Without edema, no deformity or joint abnormality.  Neurologic:  Alert and  oriented x4;  grossly normal neurologically.  Skin:   Dry and intact without significant lesions or rashes. Psychiatric: Demonstrates good judgement and reason without abnormal affect or behaviors.  No recent labs.  Assessment: 1.  Change in bowel habits: Worsening constipation per the patient 2.  Abdominal pain: All over, but worse in the right upper quadrant, ever since gallbladder out years ago, worse with movement; consider relation to adhesions versus IBS 3.  GERD: With a feeling of "swelling in my throat" and symptoms 2 to 3 days a week; consider gastritis+/-esophagitis  Plan: 1.  Ordered CBC, CMP and lipase 2.  Prescribed Linzess 72 mcg daily #30 with 3 refills.  Discontinue Linzess 145 mcg. 3.  Prescribed Omeprazole 40 mg daily, 30-60 minutes before breakfast #30 with 3 refills. 4.  Ordered CT abdomen/ pelvis for abdominal pain and change in bowel habits 5.  Discussed with patient that if labs and imaging above are normal and medicine is not helping  then may discuss EGD /colon. 6.  Patient to follow in clinic with me in 3 to 4 weeks.  Sooner if necessary.  She was assigned to Dr. Henrene Pastor today.  Ellouise Newer, PA-C [facility] 09/07/2019, 11:10 AM  Cc: Elisabeth Cara, *

## 2019-09-07 NOTE — Progress Notes (Signed)
Assessment and plan as noted 

## 2019-09-11 ENCOUNTER — Encounter (HOSPITAL_COMMUNITY): Payer: Self-pay

## 2019-09-11 ENCOUNTER — Ambulatory Visit (HOSPITAL_COMMUNITY)
Admission: RE | Admit: 2019-09-11 | Discharge: 2019-09-11 | Disposition: A | Discharge status: Home / Self Care | Payer: Medicare Other | Source: Ambulatory Visit | Attending: Physician Assistant | Admitting: Physician Assistant

## 2019-09-11 DIAGNOSIS — R1084 Generalized abdominal pain: Secondary | ICD-10-CM | POA: Diagnosis not present

## 2019-09-11 MED ORDER — SODIUM CHLORIDE (PF) 0.9 % IJ SOLN
INTRAMUSCULAR | Status: AC
  Filled 2019-09-11: qty 50

## 2019-09-11 MED ORDER — IOHEXOL 300 MG/ML  SOLN
100.0000 mL | Freq: Once | INTRAMUSCULAR | Status: AC | PRN
  Administered 2019-09-11: 100 mL via INTRAVENOUS

## 2019-09-15 ENCOUNTER — Ambulatory Visit: Payer: Medicare Other | Admitting: Orthotics

## 2019-09-15 ENCOUNTER — Other Ambulatory Visit: Payer: Self-pay

## 2019-09-15 DIAGNOSIS — M2141 Flat foot [pes planus] (acquired), right foot: Secondary | ICD-10-CM

## 2019-09-15 DIAGNOSIS — M6788 Other specified disorders of synovium and tendon, other site: Secondary | ICD-10-CM

## 2019-09-15 DIAGNOSIS — M2142 Flat foot [pes planus] (acquired), left foot: Secondary | ICD-10-CM

## 2019-09-15 DIAGNOSIS — M779 Enthesopathy, unspecified: Secondary | ICD-10-CM

## 2019-09-15 DIAGNOSIS — M2560 Stiffness of unspecified joint, not elsewhere classified: Secondary | ICD-10-CM

## 2019-09-15 DIAGNOSIS — I872 Venous insufficiency (chronic) (peripheral): Secondary | ICD-10-CM

## 2019-09-15 NOTE — Progress Notes (Signed)
Needs 9.5 shoe

## 2019-09-29 ENCOUNTER — Encounter: Payer: Self-pay | Admitting: Podiatry

## 2019-09-29 ENCOUNTER — Ambulatory Visit (INDEPENDENT_AMBULATORY_CARE_PROVIDER_SITE_OTHER): Payer: Medicare Other | Admitting: Podiatry

## 2019-09-29 ENCOUNTER — Other Ambulatory Visit: Payer: Self-pay

## 2019-09-29 ENCOUNTER — Ambulatory Visit: Payer: Medicare Other | Admitting: Orthotics

## 2019-09-29 DIAGNOSIS — M79675 Pain in left toe(s): Secondary | ICD-10-CM | POA: Diagnosis not present

## 2019-09-29 DIAGNOSIS — B351 Tinea unguium: Secondary | ICD-10-CM

## 2019-09-29 DIAGNOSIS — M79674 Pain in right toe(s): Secondary | ICD-10-CM

## 2019-09-29 DIAGNOSIS — I872 Venous insufficiency (chronic) (peripheral): Secondary | ICD-10-CM

## 2019-09-29 DIAGNOSIS — L84 Corns and callosities: Secondary | ICD-10-CM | POA: Diagnosis not present

## 2019-09-29 DIAGNOSIS — M2560 Stiffness of unspecified joint, not elsewhere classified: Secondary | ICD-10-CM

## 2019-09-29 NOTE — Progress Notes (Signed)
Patient satisified iwht her shoe

## 2019-10-04 NOTE — Progress Notes (Signed)
Subjective: Angela Wang presents today with painful, thick toenails 1-5 b/l that she cannot cut and which interfere with daily activities.  Pain is aggravated when wearing enclosed shoe gear.  Current Outpatient Medications on File Prior to Visit  Medication Sig Dispense Refill  . acetaminophen-codeine (TYLENOL #3) 300-30 MG tablet Take 1 tablet by mouth every 8 (eight) hours as needed for moderate pain. 12 tablet 0  . ALPRAZolam (XANAX) 0.5 MG tablet Take 0.5 mg by mouth 3 (three) times daily as needed for sleep or anxiety.    Marland Kitchen amoxicillin (AMOXIL) 875 MG tablet     . amoxicillin-clavulanate (AUGMENTIN) 875-125 MG tablet     . B Complex Vitamins (B COMPLEX 100 PO) Take 1 tablet by mouth daily.      . bisacodyl (DULCOLAX) 5 MG EC tablet Take 1 to 2 tablets by mouth as needed nightly    . Calcium Carbonate-Vitamin D (CALCIUM-VITAMIN D) 500-200 MG-UNIT per tablet Take 1 tablet by mouth 3 (three) times daily.      . Calcium-Magnesium-Vitamin D (CALCIUM 500) 500-250-200 MG-MG-UNIT TABS Take by mouth.    . co-enzyme Q-10 30 MG capsule Take 30 mg by mouth 3 (three) times daily.      . fluticasone (FLONASE) 50 MCG/ACT nasal spray Place 2 sprays into both nostrils daily.    . furosemide (LASIX) 20 MG tablet Take by mouth.    . iron polysaccharides (NU-IRON) 150 MG capsule Take by mouth.    . lidocaine (XYLOCAINE) 5 % ointment Apply to affected area as needed    . linaclotide (LINZESS) 72 MCG capsule Take 1 capsule (72 mcg total) by mouth daily before breakfast. 30 capsule 3  . Olopatadine HCl (PATADAY) 0.2 % SOLN Apply 1 drop to eye daily.    Marland Kitchen omeprazole (PRILOSEC) 40 MG capsule Take 1 capsule (40 mg total) by mouth every morning. Take 30-60 minutes before breakfast. 30 capsule 3  . promethazine-dextromethorphan (PROMETHAZINE-DM) 6.25-15 MG/5ML syrup Take by mouth.    . triamcinolone cream (KENALOG) 0.1 % MIX WITH CETAPHIL MOISTURIZING CREAM. APPLY THREE TIMES DAILY AS NEEDED    . vitamin C  (ASCORBIC ACID) 500 MG tablet Take 500 mg by mouth.     No current facility-administered medications on file prior to visit.     Allergies  Allergen Reactions  . Cyclobenzaprine Swelling  . Amitriptyline Hcl     REACTION: throat swelling  . Aspirin Other (See Comments)    REACTION: stomach upset  . Citalopram Other (See Comments)  . Escitalopram Oxalate     REACTION: Hallucinations  . Escitalopram Oxalate     REACTION: Hallucinations  . Furosemide Nausea And Vomiting    Abdominal pain  . Gabapentin     GI Upset   . Hydrocodone-Acetaminophen     REACTION: stomach upset  . Lorazepam     REACTION: Throat Swelling  . Oxycodone Other (See Comments)    Headache, change in mental status  . Oxycodone-Acetaminophen     REACTION: Nausea and upset stomach  . Terfenadine     REACTION: stomach upset  . Tramadol Nausea And Vomiting  . Codeine Nausea Only    REACTION: stomach upset REACTION: stomach upset REACTION: stomach upset  . Lactulose Other (See Comments)    Passed out Passed out   . Latex Rash    REACTION: rash REACTION: rash REACTION: rash  . Levofloxacin Nausea And Vomiting and Nausea Only  . Moxifloxacin Nausea And Vomiting and Nausea Only  . Pantoprazole Other (  See Comments) and Nausea Only  . Polyethylene Glycol 3350 Other (See Comments)    Makes her bloated Makes her bloated   . Rabeprazole Other (See Comments) and Nausea Only    Objective: Vascular Examination: Capillary refill time <3 seconds b/l.  Dorsalis pedis and Posterior tibial pulses palpable b/l.  Digital hair sparse b/l.  Skin temperature gradient WNL b/l.  Dermatological Examination: Skin with normal turgor, texture and tone b/l.  Toenails 2-5 left, 2-4 right discolored, thick, dystrophic with subungual debris and pain with palpation to nailbeds due to thickness of nails.  Anonychia b/l great toe(s) with evidence of permanent total nail avulsion. Nailbed(s) completely epithelialized and  intact. Anonychia right 5th digit. Nailbed intact.  Hyperkeratotic lesion distal tip left great toe with tenderness to palpation. No edema, no erythema, no drainage, no flocculence.  Musculoskeletal: Muscle strength 5/5 to all LE muscle groups  No gross bony deformities b/l.  No pain, crepitus or joint limitation noted with ROM.   Neurological: Sensation intact with 10 gram monofilament.  Vibratory sensation intact.  Assessment: Painful onychomycosis toenails 2-5 left, 2-4 right Callus left hallux  Plan: 1. Toenails 2-5 left, 2-4 right were debrided in length and girth without iatrogenic bleeding. 2. Callus pared left hallux utilizing sterile scalpel blade without incident. Dispensed silicone toe cap for daily protection when wearing enclosed shoe gear. 3. Patient to continue soft, supportive shoe gear daily. 4. Patient to report any pedal injuries to medical professional immediately. 5. Follow up 9 weeks. 6. Patient/POA to call should there be a concern in the interim.

## 2019-10-10 ENCOUNTER — Emergency Department (HOSPITAL_BASED_OUTPATIENT_CLINIC_OR_DEPARTMENT_OTHER)
Admission: EM | Admit: 2019-10-10 | Discharge: 2019-10-10 | Disposition: A | Discharge status: Home / Self Care | Payer: Medicare Other | Attending: Emergency Medicine | Admitting: Emergency Medicine

## 2019-10-10 ENCOUNTER — Other Ambulatory Visit: Payer: Self-pay

## 2019-10-10 ENCOUNTER — Encounter (HOSPITAL_BASED_OUTPATIENT_CLINIC_OR_DEPARTMENT_OTHER): Payer: Self-pay | Admitting: Emergency Medicine

## 2019-10-10 ENCOUNTER — Emergency Department (HOSPITAL_BASED_OUTPATIENT_CLINIC_OR_DEPARTMENT_OTHER): Payer: Medicare Other

## 2019-10-10 DIAGNOSIS — I10 Essential (primary) hypertension: Secondary | ICD-10-CM | POA: Insufficient documentation

## 2019-10-10 DIAGNOSIS — Z79899 Other long term (current) drug therapy: Secondary | ICD-10-CM | POA: Insufficient documentation

## 2019-10-10 DIAGNOSIS — H9201 Otalgia, right ear: Secondary | ICD-10-CM | POA: Insufficient documentation

## 2019-10-10 DIAGNOSIS — J0181 Other acute recurrent sinusitis: Secondary | ICD-10-CM | POA: Diagnosis not present

## 2019-10-10 DIAGNOSIS — E039 Hypothyroidism, unspecified: Secondary | ICD-10-CM | POA: Diagnosis not present

## 2019-10-10 DIAGNOSIS — R519 Headache, unspecified: Secondary | ICD-10-CM | POA: Diagnosis present

## 2019-10-10 DIAGNOSIS — I69359 Hemiplegia and hemiparesis following cerebral infarction affecting unspecified side: Secondary | ICD-10-CM | POA: Insufficient documentation

## 2019-10-10 DIAGNOSIS — J329 Chronic sinusitis, unspecified: Secondary | ICD-10-CM

## 2019-10-10 LAB — CBC WITH DIFFERENTIAL/PLATELET
Abs Immature Granulocytes: 0.01 10*3/uL (ref 0.00–0.07)
Basophils Absolute: 0 10*3/uL (ref 0.0–0.1)
Basophils Relative: 1 %
Eosinophils Absolute: 0.2 10*3/uL (ref 0.0–0.5)
Eosinophils Relative: 4 %
HCT: 37.2 % (ref 36.0–46.0)
Hemoglobin: 11 g/dL — ABNORMAL LOW (ref 12.0–15.0)
Immature Granulocytes: 0 %
Lymphocytes Relative: 39 %
Lymphs Abs: 2.2 10*3/uL (ref 0.7–4.0)
MCH: 24.9 pg — ABNORMAL LOW (ref 26.0–34.0)
MCHC: 29.6 g/dL — ABNORMAL LOW (ref 30.0–36.0)
MCV: 84.4 fL (ref 80.0–100.0)
Monocytes Absolute: 0.7 10*3/uL (ref 0.1–1.0)
Monocytes Relative: 13 %
Neutro Abs: 2.4 10*3/uL (ref 1.7–7.7)
Neutrophils Relative %: 43 %
Platelets: 180 10*3/uL (ref 150–400)
RBC: 4.41 MIL/uL (ref 3.87–5.11)
RDW: 15.9 % — ABNORMAL HIGH (ref 11.5–15.5)
WBC: 5.6 10*3/uL (ref 4.0–10.5)
nRBC: 0 % (ref 0.0–0.2)

## 2019-10-10 LAB — BASIC METABOLIC PANEL
Anion gap: 7 (ref 5–15)
BUN: 13 mg/dL (ref 8–23)
CO2: 27 mmol/L (ref 22–32)
Calcium: 9.4 mg/dL (ref 8.9–10.3)
Chloride: 104 mmol/L (ref 98–111)
Creatinine, Ser: 0.86 mg/dL (ref 0.44–1.00)
GFR calc Af Amer: >60 mL/min (ref >60)
GFR calc non Af Amer: >60 mL/min (ref >60)
Glucose, Bld: 100 mg/dL — ABNORMAL HIGH (ref 70–99)
Potassium: 4.7 mmol/L (ref 3.5–5.1)
Sodium: 138 mmol/L (ref 135–145)

## 2019-10-10 LAB — SEDIMENTATION RATE: Sed Rate: 41 mm/hr — ABNORMAL HIGH (ref 0–22)

## 2019-10-10 LAB — TROPONIN I (HIGH SENSITIVITY): Troponin I (High Sensitivity): 2 ng/L (ref <18)

## 2019-10-10 MED ORDER — IOHEXOL 350 MG/ML SOLN
100.0000 mL | Freq: Once | INTRAVENOUS | Status: AC | PRN
  Administered 2019-10-10: 100 mL via INTRAVENOUS

## 2019-10-10 MED ORDER — AMOXICILLIN-POT CLAVULANATE 875-125 MG PO TABS: 1.0000 | ORAL_TABLET | Freq: Two times a day (BID) | ORAL | 0 refills | Status: DC

## 2019-10-10 MED ORDER — AMOXICILLIN-POT CLAVULANATE 875-125 MG PO TABS
1.0000 | ORAL_TABLET | Freq: Once | ORAL | Status: AC
  Administered 2019-10-10: 1 via ORAL
  Filled 2019-10-10: qty 1

## 2019-10-10 MED ORDER — METHYLPREDNISOLONE SODIUM SUCC 125 MG IJ SOLR
60.0000 mg | Freq: Once | INTRAMUSCULAR | Status: AC
  Administered 2019-10-10: 60 mg via INTRAVENOUS
  Filled 2019-10-10: qty 2

## 2019-10-10 MED ORDER — GUAIFENESIN 100 MG/5ML PO SYRP: 100.0000 mg | ORAL_SOLUTION | ORAL | 0 refills | Status: DC | PRN

## 2019-10-10 NOTE — ED Triage Notes (Signed)
Pt c/o headache, sinus pressure, and R ear pain x 1 week

## 2019-10-10 NOTE — ED Provider Notes (Signed)
[facility] Provider Note   CSN: [id number] Arrival date & time: 10/10/19  1007     History   Chief Complaint Chief Complaint  Patient presents with   Headache   Otalgia    HPI Angela Wang is a 85 y.o. female.     Patient with previous stroke and residual right-sided weakness as well as cerebral aneurysm status post coiling presenting with a 3-day history of "sinus problems" involving the right side of her head, face, neck and ear.  She states she has had congestion in her nose as well as cough productive of yellow-green mucus.  She has fullness in her right ear and pressure in her face.  She denies any change of her chronic respiratory weakness.  She denies any fevers.  She denies any chest pain or shortness of breath but does feel her heart race from time to time.  Symptoms are similar to her previous presentations for sinusitis.  She has no chest pain or shortness of breath.  No abdominal pain, nausea or vomiting.  She states she usually takes Augmentin for her sinus problems but she is out.  She is also using Flonase nasal spray with minor relief.  She denies sudden onset headache, she denies any change in her chronic right-sided weakness.  She denies any difficulty breathing or difficulty swallowing.  No fever or  known coronavirus exposures.  The history is provided by the patient.  Headache Associated symptoms: congestion, drainage and ear pain   Associated symptoms: no abdominal pain, no cough, no fever, no myalgias, no nausea, no sore throat, no vomiting and no weakness   Otalgia Associated symptoms: congestion, headaches and rhinorrhea   Associated symptoms: no abdominal pain, no cough, no fever, no rash, no sore throat and no vomiting     Past Medical History:  Diagnosis Date   ALLERGIC RHINITIS 06/21/2007   Qualifier: Diagnosis of  By: Wynona Luna    Allergy    allergic rhinitis   Anemia    nos   ANEMIA-NOS 06/21/2007   Qualifier: Diagnosis of  By: Wynona Luna    ANXIETY 06/21/2007   Qualifier: Diagnosis of  By: Wynona Luna    Arthritis    osteoarthritis   Atypical chest pain 09/2008   negative myoview   BACK PAIN, LUMBAR 03/06/2010   Qualifier: Diagnosis of  By: Wynona Luna    CEREBRAL ANEURYSM 06/21/2007   Qualifier: History of  By: Yoo DO, [place], HX OF 06/21/2007   Qualifier: Diagnosis of  By: Wynona Luna    CHEST PAIN, ATYPICAL 09/02/2008   Qualifier: Diagnosis of  By: Wynona Luna    Chronic sinusitis    CONSTIPATION, CHRONIC 09/20/2009   Qualifier: Diagnosis of  By: Olevia Perches MD, Lowella Bandy    Depression    DEPRESSION 06/21/2007   Qualifier: Diagnosis of  By: Wynona Luna    Diverticulosis    DIVERTICULOSIS-COLON 06/09/2008   Qualifier: Diagnosis of  By: Chester Holstein NP, Paula     Fibromyalgia    FIBROMYALGIA 06/21/2007   Qualifier: Diagnosis of  By: Berneta Sages, HX OF 08/25/2008   Qualifier: Diagnosis of  By: Nelson-Smith CMA (AAMA), Dottie     GERD 05/03/2008   Qualifier: Diagnosis of  By: Julien Girt CMA, Leigh     GERD (gastroesophageal reflux disease)    Headache 03/15/2011  History of solitary pulmonary nodule    right, stable by CT   Hyperlipidemia    HYPERLIPIDEMIA 06/21/2007   Qualifier: Diagnosis of  By: Wynona Luna    Hypertension    HYPERTENSION 06/21/2007   Qualifier: Diagnosis of  By: Wynona Luna    HYPOGLYCEMIA 10/24/2010   Qualifier: Diagnosis of  By: Wynona Luna    HYPOTHYROIDISM 07/22/2008   Qualifier: Diagnosis of  By: Wynona Luna    IBS 06/21/2007   Qualifier: Diagnosis of  By: Wynona Luna    IBS (irritable bowel syndrome)    INSOMNIA, CHRONIC 07/19/2009   Qualifier: Diagnosis of  By: Wynona Luna    LEG PAIN, RIGHT 02/01/2009   Qualifier: Diagnosis of  By: Wynona Luna    Leukopenia    chronic   LEUKOPENIA, CHRONIC 06/21/2007    Qualifier: Diagnosis of  By: Wynona Luna    NECK PAIN 02/03/2008   Qualifier: Diagnosis of  By: Wynona Luna    NUMBNESS 01/29/2008   Qualifier: Diagnosis of  By: Loanne Drilling MD, Derek Jack NOS-Unspec 06/21/2007   Centricity Description: DEGENERATIVE JOINT DISEASE, MILD Qualifier: Diagnosis of  By: Wynona Luna  Centricity Description: OSTEOARTHRITIS Qualifier: Diagnosis of  By: Wynona Luna    PULMONARY NODULE 12/26/2007   Qualifier: Diagnosis of  By: Wynona Luna    Routine general medical examination at a health care facility    SCIATICA, RIGHT 05/06/2008   Qualifier: Diagnosis of  By: Nathaneil Canary CMA, Sarah     Stroke [facility])    THROMBOCYTOPENIA 02/09/2009   Qualifier: Diagnosis of  By: Wynona Luna    UNSTEADY GAIT 07/28/2009   Qualifier: Diagnosis of  By: Wynona Luna    VENOUS INSUFFICIENCY 05/03/2008   Qualifier: Diagnosis of  By: Julien Girt CMA, Leigh      Patient Active Problem List   Diagnosis Date Noted   Peripheral vascular disease ([place]) 05/09/2016   Seasonal allergic rhinitis due to pollen 05/09/2016   Benign neoplasm of left retina 01/10/2016   Pseudophakia of both eyes 01/10/2016   Pseudophakia 01/10/2016   Retinal edema 01/10/2016   Decreased mobility and endurance 11/22/2015   Pain due to knee joint prosthesis ([place]) 10/02/2013   Cerebral embolism with cerebral infarction ([place]) 07/10/2013   Bilateral posterior capsular opacification 05/19/2012   Branch retinal vein occlusion 02/08/2012   Cystoid macular edema 02/08/2012   Macular degeneration 02/08/2012   Headache(784.0) 03/15/2011   HYPOGLYCEMIA 10/24/2010   BACK PAIN, LUMBAR 03/06/2010   CONSTIPATION, CHRONIC 09/20/2009   SINUSITIS, CHRONIC 08/16/2009   UNSTEADY GAIT 07/28/2009   INSOMNIA, CHRONIC 07/19/2009   THROMBOCYTOPENIA 02/09/2009   LEG PAIN, RIGHT 02/01/2009   Other abnormal glucose 02/01/2009   CHEST PAIN, ATYPICAL 09/02/2008    GASTRITIS, HX OF 08/25/2008   HYPOTHYROIDISM 07/22/2008   DIVERTICULOSIS-COLON 06/09/2008   Sciatica of left side 05/06/2008   VENOUS INSUFFICIENCY 05/03/2008   GERD 05/03/2008   NECK PAIN 02/03/2008   NUMBNESS 01/29/2008   PULMONARY NODULE 12/26/2007   HYPERLIPIDEMIA 06/21/2007   ANEMIA-NOS 06/21/2007   LEUKOPENIA, CHRONIC 06/21/2007   ANXIETY 06/21/2007   DEPRESSION 06/21/2007   HYPERTENSION 06/21/2007   CEREBRAL ANEURYSM 06/21/2007   ALLERGIC RHINITIS 06/21/2007   IBS 06/21/2007   Osteoarthrosis, unspecified whether generalized or localized, unspecified site 06/21/2007   FIBROMYALGIA 06/21/2007   CEREBROVASCULAR ACCIDENT, HX  OF 06/21/2007    Past Surgical History:  Procedure Laterality Date   ABDOMINAL HYSTERECTOMY     ANEURYSM COILING     APPENDECTOMY     CHOLECYSTECTOMY     JOINT REPLACEMENT     total right knee replacement   KNEE SURGERY       OB History   No obstetric history on file.      Home Medications    Prior to Admission medications   Medication Sig Start Date End Date Taking? Authorizing Provider  acetaminophen-codeine (TYLENOL #3) 300-30 MG tablet Take 1 tablet by mouth every 8 (eight) hours as needed for moderate pain. 06/18/18   Trula Slade, DPM  ALPRAZolam Duanne Moron) 0.5 MG tablet Take 0.5 mg by mouth 3 (three) times daily as needed for sleep or anxiety.    [provider]  amoxicillin (AMOXIL) 875 MG tablet  12/18/18   [provider]  amoxicillin-clavulanate (AUGMENTIN) 875-125 MG tablet  07/07/19   [provider]  B Complex Vitamins (B COMPLEX 100 PO) Take 1 tablet by mouth daily.      [provider]  bisacodyl (DULCOLAX) 5 MG EC tablet Take 1 to 2 tablets by mouth as needed nightly    [provider]  Calcium Carbonate-Vitamin D (CALCIUM-VITAMIN D) 500-200 MG-UNIT per tablet Take 1 tablet by mouth 3 (three) times daily.      [provider]    Calcium-Magnesium-Vitamin D (CALCIUM 500) [phone] MG-MG-UNIT TABS Take by mouth.    [provider]  co-enzyme Q-10 30 MG capsule Take 30 mg by mouth 3 (three) times daily.      [provider]  fluticasone (FLONASE) 50 MCG/ACT nasal spray Place 2 sprays into both nostrils daily.    [provider]  furosemide (LASIX) 20 MG tablet Take by mouth. 07/07/19   [provider]  iron polysaccharides (NU-IRON) 150 MG capsule Take by mouth. 02/11/18   [provider]  lidocaine (XYLOCAINE) 5 % ointment Apply to affected area as needed 03/04/18   [provider]  linaclotide (LINZESS) 72 MCG capsule Take 1 capsule (72 mcg total) by mouth daily before breakfast. 09/07/19   Levin Erp, PA  Olopatadine HCl (PATADAY) 0.2 % SOLN Apply 1 drop to eye daily.    [provider]  omeprazole (PRILOSEC) 40 MG capsule Take 1 capsule (40 mg total) by mouth every morning. Take 30-60 minutes before breakfast. 09/07/19   Levin Erp, PA  promethazine-dextromethorphan (PROMETHAZINE-DM) 6.25-15 MG/5ML syrup Take by mouth. 12/18/18   [provider]  triamcinolone cream (KENALOG) 0.1 % MIX WITH CETAPHIL MOISTURIZING CREAM. APPLY THREE TIMES DAILY AS NEEDED 10/30/16   [provider]  vitamin C (ASCORBIC ACID) 500 MG tablet Take 500 mg by mouth.    [provider]    Family History Family History  Problem Relation Age of Onset   Cancer Mother        pancreatic   Coronary artery disease Father    Cancer Sister         breast   Asthma Sister    Breast cancer Sister    Cancer Brother        prostate   Allergies Brother    Cancer Maternal Aunt        colon   Diabetes Other    Cancer Other        ovarian    Social History Social History   Tobacco Use   Smoking status:  Never Smoker   Smokeless tobacco: Never Used  Substance Use Topics   Alcohol use: No   Drug use: No     Allergies    Cyclobenzaprine, Amitriptyline hcl, Aspirin, Citalopram, Escitalopram oxalate, Escitalopram oxalate, Furosemide, Gabapentin, Hydrocodone-acetaminophen, Lorazepam, Oxycodone, Oxycodone-acetaminophen, Terfenadine, Tramadol, Codeine, Lactulose, Latex, Levofloxacin, Moxifloxacin, Pantoprazole, Polyethylene glycol 3350, and Rabeprazole   Review of Systems Review of Systems  Constitutional: Negative for activity change, appetite change and fever.  HENT: Positive for congestion, ear pain, postnasal drip and rhinorrhea. Negative for sore throat.   Respiratory: Negative for cough, chest tightness and shortness of breath.   Cardiovascular: Positive for palpitations. Negative for chest pain.  Gastrointestinal: Negative for abdominal pain, nausea and vomiting.  Genitourinary: Negative for dysuria and hematuria.  Musculoskeletal: Negative for arthralgias and myalgias.  Skin: Negative for rash.  Neurological: Positive for headaches. Negative for weakness.    all other systems are negative except as noted in the HPI and PMH.    Physical Exam Updated Vital Signs BP 135/79    Pulse 68    Temp 98.3 F (36.8 C) (Oral)    Resp (!) 22    Ht 5' 5.5" (1.664 m)    Wt 81.6 kg    SpO2 98%    BMI 29.50 kg/m   Physical Exam Vitals signs and nursing note reviewed.  Constitutional:      General: She is not in acute distress.    Appearance: She is well-developed.  HENT:     Head: Normocephalic and atraumatic.     Right Ear: Tympanic membrane normal.     Left Ear: Tympanic membrane normal.     Ears:     Comments: Scattered dry cerumen right ear.  No temporal artery tenderness  Bilateral frontal and maxillary sinus tenderness. Oropharynx is clear, no exudates or asymmetry Uvula midline    Mouth/Throat:     Mouth: Mucous membranes are moist.     Pharynx: No oropharyngeal exudate.  Eyes:     Conjunctiva/sclera: Conjunctivae normal.     Pupils: Pupils are equal, round, and reactive to light.  Neck:      Musculoskeletal: Normal range of motion and neck supple.     Comments: No meningismus. Pain with range of motion of the head to the right.  There is no meningismus.  No appreciable lymphadenopathy Cardiovascular:     Rate and Rhythm: Normal rate and regular rhythm.     Heart sounds: Normal heart sounds. No murmur.  Pulmonary:     Effort: Pulmonary effort is normal. No respiratory distress.     Breath sounds: Normal breath sounds.  Chest:     Chest wall: No tenderness.  Abdominal:     Palpations: Abdomen is soft.     Tenderness: There is no abdominal tenderness. There is no guarding or rebound.  Musculoskeletal: Normal range of motion.        General: No tenderness.  Skin:    General: Skin is warm.  Neurological:     General: No focal deficit present.     Mental Status: She is alert and oriented to person, place, and time. Mental status is at baseline.     Cranial Nerves: No cranial nerve deficit.     Motor: No abnormal muscle tone.     Coordination: Coordination normal.     Comments: Cranial nerves II to XII intact, 5/5 strength throughout, no ataxia finger-to-nose, no appreciable nystagmus Normal gait, negative Romberg  Psychiatric:        Behavior:  Behavior normal.      ED Treatments / Results  Labs (all labs ordered are listed, but only abnormal results are displayed) Labs Reviewed  CBC WITH DIFFERENTIAL/PLATELET - Abnormal; Notable for the following components:      Result Value   Hemoglobin 11.0 (*)    MCH 24.9 (*)    MCHC 29.6 (*)    RDW 15.9 (*)    All other components within normal limits  BASIC METABOLIC PANEL - Abnormal; Notable for the following components:   Glucose, Bld 100 (*)    All other components within normal limits  SEDIMENTATION RATE - Abnormal; Notable for the following components:   Sed Rate 41 (*)    All other components within normal limits  TROPONIN I (HIGH SENSITIVITY)    EKG EKG Interpretation  Date/Time:  Saturday October 10 2019  10:09:45 EST Ventricular Rate:  74 PR Interval:    QRS Duration: 103 QT Interval:  404 QTC Calculation: 449 R Axis:   56 Text Interpretation: Sinus rhythm No significant change was found Confirmed by Ezequiel Essex [phone]) on 10/10/2019 10:15:03 AM   Radiology Ct Angio Head W Or Wo Contrast  Result Date: 10/10/2019 CLINICAL DATA:  History cerebral aneurysm and subarachnoid hemorrhage. Headache EXAM: CT ANGIOGRAPHY HEAD AND NECK TECHNIQUE: Multidetector CT imaging of the head and neck was performed using the standard protocol during bolus administration of intravenous contrast. Multiplanar CT image reconstructions and MIPs were obtained to evaluate the vascular anatomy. Carotid stenosis measurements (when applicable) are obtained utilizing NASCET criteria, using the distal internal carotid diameter as the denominator. CONTRAST:  174m OMNIPAQUE IOHEXOL 350 MG/ML SOLN COMPARISON:  CT head 10/21/2018 FINDINGS: CT HEAD FINDINGS Brain: Mild atrophy. Ventricle size normal. Chronic microvascular ischemic changes in the white matter similar to the prior study. No acute infarct, hemorrhage, mass Vascular: Right MCA coiling causing artifact. No change in coil appearance compared with the prior study. Skull: Negative Sinuses: Complete opacification right maxillary sinus with hyperdense secretions. This appears similar on the prior study although is only partially imaged previously. Small air-fluid level in the sphenoid sinus. Mucosal thickening in the ethmoid sinuses. Orbits: Bilateral cataract surgery.  No orbital lesion. Review of the MIP images confirms the above findings CTA NECK FINDINGS Aortic arch: Aortic arch and proximal great vessels appear normal without stenosis. Right carotid system: Right carotid widely patent without stenosis. No significant atherosclerotic disease. Negative for dissection. Left carotid system: Left carotid widely patent. Small calcification left carotid bifurcation. Negative for  dissection. Vertebral arteries: Both vertebral arteries widely patent to the basilar. Skeleton: No acute skeletal abnormality.  Cervical spondylosis. Other neck: Subcentimeter left thyroid nodule. No adenopathy or mass in the neck. Upper chest: Lung apices clear bilaterally. Review of the MIP images confirms the above findings CTA HEAD FINDINGS Anterior circulation: Mild atherosclerotic disease in the cavernous carotid bilaterally. Anterior and middle cerebral arteries patent bilaterally. Aneurysm coiling right MCA bifurcation. No recurrent or residual aneurysm identified. Posterior circulation: Both vertebral arteries patent to the basilar. PICA patent bilaterally. Basilar widely patent. Superior cerebellar and posterior cerebral arteries patent bilaterally. Venous sinuses: Normal venous enhancement. Anatomic variants: Negative for cerebral aneurysm Review of the MIP images confirms the above findings IMPRESSION: 1. No acute intracranial abnormality 2. Status post coiling of right MCA aneurysm. No recurrent aneurysm identified. 3. No significant carotid or vertebral artery stenosis. Electronically Signed   By: CFranchot GalloM.D.   On: 10/10/2019 12:37   Ct Angio Neck W And/or Wo Contrast  Result Date: 10/10/2019 CLINICAL DATA:  History cerebral aneurysm and subarachnoid hemorrhage. Headache EXAM: CT ANGIOGRAPHY HEAD AND NECK TECHNIQUE: Multidetector CT imaging of the head and neck was performed using the standard protocol during bolus administration of intravenous contrast. Multiplanar CT image reconstructions and MIPs were obtained to evaluate the vascular anatomy. Carotid stenosis measurements (when applicable) are obtained utilizing NASCET criteria, using the distal internal carotid diameter as the denominator. CONTRAST:  133m OMNIPAQUE IOHEXOL 350 MG/ML SOLN COMPARISON:  CT head 10/21/2018 FINDINGS: CT HEAD FINDINGS Brain: Mild atrophy. Ventricle size normal. Chronic microvascular ischemic changes in the  white matter similar to the prior study. No acute infarct, hemorrhage, mass Vascular: Right MCA coiling causing artifact. No change in coil appearance compared with the prior study. Skull: Negative Sinuses: Complete opacification right maxillary sinus with hyperdense secretions. This appears similar on the prior study although is only partially imaged previously. Small air-fluid level in the sphenoid sinus. Mucosal thickening in the ethmoid sinuses. Orbits: Bilateral cataract surgery.  No orbital lesion. Review of the MIP images confirms the above findings CTA NECK FINDINGS Aortic arch: Aortic arch and proximal great vessels appear normal without stenosis. Right carotid system: Right carotid widely patent without stenosis. No significant atherosclerotic disease. Negative for dissection. Left carotid system: Left carotid widely patent. Small calcification left carotid bifurcation. Negative for dissection. Vertebral arteries: Both vertebral arteries widely patent to the basilar. Skeleton: No acute skeletal abnormality.  Cervical spondylosis. Other neck: Subcentimeter left thyroid nodule. No adenopathy or mass in the neck. Upper chest: Lung apices clear bilaterally. Review of the MIP images confirms the above findings CTA HEAD FINDINGS Anterior circulation: Mild atherosclerotic disease in the cavernous carotid bilaterally. Anterior and middle cerebral arteries patent bilaterally. Aneurysm coiling right MCA bifurcation. No recurrent or residual aneurysm identified. Posterior circulation: Both vertebral arteries patent to the basilar. PICA patent bilaterally. Basilar widely patent. Superior cerebellar and posterior cerebral arteries patent bilaterally. Venous sinuses: Normal venous enhancement. Anatomic variants: Negative for cerebral aneurysm Review of the MIP images confirms the above findings IMPRESSION: 1. No acute intracranial abnormality 2. Status post coiling of right MCA aneurysm. No recurrent aneurysm  identified. 3. No significant carotid or vertebral artery stenosis. Electronically Signed   By: CFranchot GalloM.D.   On: 10/10/2019 12:37   Dg Chest Portable 1 View  Result Date: 10/10/2019 CLINICAL DATA:  Headache, cough, headache, sinus pressure and right ear pain for 1 week. History of stroke and pulmonary nodule. EXAM: PORTABLE CHEST 1 VIEW COMPARISON:  Report dated 11/18/2015 FINDINGS: Mildly enlarged cardiac silhouette. Clear lungs. Calcified right hilar lymph node. Mild scoliosis and mild thoracic spine degenerative changes. Superior migration of both humeral heads with mild to moderate inferior glenohumeral spur formation and mild to moderate bilateral AC joint degenerative changes. IMPRESSION: 1. No acute abnormality. 2. Mild cardiomegaly. 3. Mild-to-moderate bilateral glenohumeral degenerative changes with evidence of large, chronic bilateral rotator cuff tears. Electronically Signed   By: SClaudie ReveringM.D.   On: 10/10/2019 13:44    Procedures Procedures (including critical care time)  Medications Ordered in ED Medications  methylPREDNISolone sodium succinate (SOLU-MEDROL) 125 mg/2 mL injection 60 mg (has no administration in time range)     Initial Impression / Assessment and Plan / ED Course  I have reviewed the triage vital signs and the nursing notes.  Pertinent labs & imaging results that were available during my care of the patient were reviewed by me and considered in my medical decision making (see chart for details).  Patient with history of previous stroke as well as aneurysm status post coiling presenting to 4-day history of congestion, ear pain, face pain and headache.  She has no neurological deficits.  No sudden onset thunderclap headache.   CTA shows status post coiling of aneurysm stable.  No evidence of recurrent aneurysm.  No evidence of other acute pathology. ESR is 41.  Low suspicion for subarachnoid hemorrhage, meningitis, temporal arteritis.  EKG  and CXR stable. Troponin negative. No chest pain.   Tolerating  PO and ambulatory. Was given dose of steroids. Will treat recurrent sinusitis with antibiotics. Followup with PCP and ENT.  Return precautions discussed.   Final Clinical Impressions(s) / ED Diagnoses   Final diagnoses:  Recurrent sinusitis    ED Discharge Orders    None       Lyvonne Cassell, Annie Main, MD 10/10/19 [phone]

## 2019-10-10 NOTE — ED Notes (Signed)
Patient transported to CT 

## 2019-10-10 NOTE — Discharge Instructions (Signed)
Your CT scan is stable.  Take the antibiotics as prescribed and follow-up with your primary doctor.  Return to the ED with worsening headache, unilateral weakness, numbness, difficulty speaking, difficulty swallowing or other concerns.

## 2019-10-10 NOTE — ED Notes (Signed)
Drinking ginger ale.

## 2019-11-05 ENCOUNTER — Encounter: Payer: Self-pay | Admitting: Podiatry

## 2019-11-05 ENCOUNTER — Other Ambulatory Visit: Payer: Self-pay

## 2019-11-05 ENCOUNTER — Ambulatory Visit: Payer: Medicare Other | Admitting: Orthotics

## 2019-11-05 ENCOUNTER — Ambulatory Visit: Payer: Medicare Other | Admitting: Podiatry

## 2019-11-05 DIAGNOSIS — M2142 Flat foot [pes planus] (acquired), left foot: Secondary | ICD-10-CM

## 2019-11-05 DIAGNOSIS — M79675 Pain in left toe(s): Secondary | ICD-10-CM

## 2019-11-05 DIAGNOSIS — M779 Enthesopathy, unspecified: Secondary | ICD-10-CM

## 2019-11-05 DIAGNOSIS — B351 Tinea unguium: Secondary | ICD-10-CM

## 2019-11-05 DIAGNOSIS — I872 Venous insufficiency (chronic) (peripheral): Secondary | ICD-10-CM | POA: Diagnosis not present

## 2019-11-05 DIAGNOSIS — M2141 Flat foot [pes planus] (acquired), right foot: Secondary | ICD-10-CM

## 2019-11-05 DIAGNOSIS — M2560 Stiffness of unspecified joint, not elsewhere classified: Secondary | ICD-10-CM

## 2019-11-05 NOTE — Progress Notes (Signed)
Reordered shoes for a mesh atheltic shoe.

## 2019-11-16 ENCOUNTER — Other Ambulatory Visit: Payer: Self-pay

## 2019-11-16 ENCOUNTER — Ambulatory Visit: Payer: Medicare Other | Admitting: Orthotics

## 2019-11-16 ENCOUNTER — Telehealth: Payer: Self-pay | Admitting: *Deleted

## 2019-11-16 DIAGNOSIS — M779 Enthesopathy, unspecified: Secondary | ICD-10-CM

## 2019-11-16 DIAGNOSIS — M2141 Flat foot [pes planus] (acquired), right foot: Secondary | ICD-10-CM

## 2019-11-16 DIAGNOSIS — R2681 Unsteadiness on feet: Secondary | ICD-10-CM

## 2019-11-16 DIAGNOSIS — R609 Edema, unspecified: Secondary | ICD-10-CM

## 2019-11-16 DIAGNOSIS — M2560 Stiffness of unspecified joint, not elsewhere classified: Secondary | ICD-10-CM

## 2019-11-16 DIAGNOSIS — M2142 Flat foot [pes planus] (acquired), left foot: Secondary | ICD-10-CM

## 2019-11-16 DIAGNOSIS — I872 Venous insufficiency (chronic) (peripheral): Secondary | ICD-10-CM

## 2019-11-16 NOTE — Addendum Note (Signed)
Addended by: Harriett Sine D on: 11/16/2019 10:20 AM   Modules accepted: Orders

## 2019-11-16 NOTE — Progress Notes (Signed)
Subjective: 85 year old female presents the office today for follow-up evaluation of bilateral foot pain.  She states that she has had shoes which is been helping as well as compression.  She would like referral to her arthritis doctor.  She had previously seen Dr. Dora Sims for fibromyalgia.  She has multiple joint issues and she was previously reevaluated by her.  Also asking for her nails be trimmed today.  Denies any systemic complaints such as fevers, chills, nausea, vomiting. No acute changes since last appointment, and no other complaints at this time.   Objective: AAO x3, NAD DP/PT pulses palpable bilaterally, CRT less than 3 seconds Mild chronic bilateral lower extremity edema present.  There is no area pinpoint tenderness identified.  Flexor, extensor tendons appear to be intact.  Nails are hypertrophic, dystrophic, discolored with yellow-brown discoloration.  No signs of infection of the toenail sites.  No pain with calf compression, swelling, warmth, erythema  Assessment: Resolving bilateral foot pain, onychomycosis  Plan: -All treatment options discussed with the patient including all alternatives, risks, complications.  -Overall her pain is improving.  Continue with supportive shoes, rehab exercises.  Voltaren gel as needed. -As a courtesy debride the nails without any complications or bleeding -Referral to be placed for Dr. Dora Sims at her reqeust.  -Patient encouraged to call the office with any questions, concerns, change in symptoms.   Trula Slade DPM

## 2019-11-16 NOTE — Telephone Encounter (Signed)
I called Dr. Charlette Caffey new office and the scheduling coordinator states she could see Dr. Leigh Aurora referral in Epic and the referral and information did not need to be faxed, they are performing virtual visits at this time and scheduling in to the end of January 2021, and Dr. Estanislado Pandy does not prescribe narcotic pain medication and therefore does not treat fibromyalgia.

## 2019-11-16 NOTE — Telephone Encounter (Signed)
-----   Message from Trula Slade, DPM sent at 11/16/2019  7:40 AM EST ----- Can you please put in a referral to Dr. Dora Sims for fibromyalgia?  Patient states she has been seen by her several years ago when she was to be reestablished.

## 2019-11-16 NOTE — Progress Notes (Signed)
Shoes not working out, going to refund the $250 self pay

## 2019-11-16 NOTE — Telephone Encounter (Signed)
I called [employer], receptionist states Dr. Estanislado Pandy is no longer with their office, her new number is [phone].

## 2019-11-16 NOTE — Telephone Encounter (Signed)
Faxed referral, 6 months of clinicals and demographics to Dr. Estanislado Pandy.

## 2019-11-16 NOTE — Telephone Encounter (Signed)
Both fax number to [facility] - Dr. Bo Merino showed no answer.

## 2019-11-18 ENCOUNTER — Encounter: Payer: Self-pay | Admitting: Orthopaedic Surgery

## 2019-11-18 ENCOUNTER — Other Ambulatory Visit: Payer: Self-pay

## 2019-11-18 ENCOUNTER — Ambulatory Visit: Payer: Medicare Other | Admitting: Orthopaedic Surgery

## 2019-11-18 DIAGNOSIS — M1712 Unilateral primary osteoarthritis, left knee: Secondary | ICD-10-CM

## 2019-11-18 MED ORDER — LIDOCAINE HCL 1 % IJ SOLN
2.0000 mL | INTRAMUSCULAR | Status: AC | PRN
  Administered 2019-11-18: 2 mL

## 2019-11-18 MED ORDER — BUPIVACAINE HCL 0.5 % IJ SOLN
2.0000 mL | INTRAMUSCULAR | Status: AC | PRN
  Administered 2019-11-18: 2 mL via INTRA_ARTICULAR

## 2019-11-18 MED ORDER — METHYLPREDNISOLONE ACETATE 40 MG/ML IJ SUSP
40.0000 mg | INTRAMUSCULAR | Status: AC | PRN
  Administered 2019-11-18: 40 mg via INTRA_ARTICULAR

## 2019-11-18 NOTE — Progress Notes (Signed)
Office Visit Note   Patient: Angela Wang           Date of Birth: 05-02-1931           MRN: [id number] Visit Date: 11/18/2019              Requested by: Elisabeth Cara, [place] [id number] [place],  [place] [id number] PCP: Belva Bertin, [place], [place]   Assessment & Plan: Visit Diagnoses:  1. Primary osteoarthritis of left knee     Plan: My impression is left knee DJD and effusion.  Clinical findings and impression reviewed with the patient today.  Effusion was aspirated today and cortisone was injected.  Patient tolerated this well.  Follow-Up Instructions: Return if symptoms worsen or fail to improve.   Orders:  No orders of the defined types were placed in this encounter.  No orders of the defined types were placed in this encounter.     Procedures: Large Joint Inj: L knee on 11/18/2019 2:38 PM Details: 22 G needle Medications: 2 mL bupivacaine 0.5 %; 2 mL lidocaine 1 %; 40 mg methylPREDNISolone acetate 40 MG/ML Aspirate: 22 mL yellow; sent for lab analysis Outcome: tolerated well, no immediate complications Patient was prepped and draped in the usual sterile fashion.       Clinical Data: No additional findings.   Subjective: Chief Complaint  Patient presents with  . Left Knee - Injections    Angela Wang is here for evaluation of left knee pain and swelling.  She states that this has progressively gotten worse recently.  She notices stiffness and start up pain.  Denies any injuries.  She is having trouble ambulating due to the pain.   Review of Systems  Constitutional: Negative.   HENT: Negative.   Eyes: Negative.   Respiratory: Negative.   Cardiovascular: Negative.   Endocrine: Negative.   Musculoskeletal: Negative.   Neurological: Negative.   Hematological: Negative.   Psychiatric/Behavioral: Negative.   All other systems reviewed and are negative.    Objective: Vital Signs: There were no vitals taken for this visit.  Physical  Exam Vitals and nursing note reviewed.  Constitutional:      Appearance: She is well-developed.  HENT:     Head: Normocephalic and atraumatic.  Pulmonary:     Effort: Pulmonary effort is normal.  Abdominal:     Palpations: Abdomen is soft.  Musculoskeletal:     Cervical back: Neck supple.  Skin:    General: Skin is warm.     Capillary Refill: Capillary refill takes less than 2 seconds.  Neurological:     Mental Status: She is alert and oriented to person, place, and time.  Psychiatric:        Behavior: Behavior normal.        Thought Content: Thought content normal.        Judgment: Judgment normal.     Ortho Exam Left knee exam shows moderate joint effusion.  Because patient is stable.  No signs of infection.  Normal range of motion Specialty Comments:  No specialty comments available.  Imaging: No results found.   PMFS History: Patient Active Problem List   Diagnosis Date Noted  . Peripheral vascular disease ([place]) 05/09/2016  . Seasonal allergic rhinitis due to pollen 05/09/2016  . Benign neoplasm of left retina 01/10/2016  . Pseudophakia of both eyes 01/10/2016  . Pseudophakia 01/10/2016  . Retinal edema 01/10/2016  . Decreased mobility and endurance 11/22/2015  . Pain due to  knee joint prosthesis ([place]) 10/02/2013  . Cerebral embolism with cerebral infarction ([place]) 07/10/2013  . Bilateral posterior capsular opacification 05/19/2012  . Branch retinal vein occlusion 02/08/2012  . Cystoid macular edema 02/08/2012  . Macular degeneration 02/08/2012  . Headache(784.0) 03/15/2011  . HYPOGLYCEMIA 10/24/2010  . BACK PAIN, LUMBAR 03/06/2010  . CONSTIPATION, CHRONIC 09/20/2009  . SINUSITIS, CHRONIC 08/16/2009  . UNSTEADY GAIT 07/28/2009  . INSOMNIA, CHRONIC 07/19/2009  . THROMBOCYTOPENIA 02/09/2009  . LEG PAIN, RIGHT 02/01/2009  . Other abnormal glucose 02/01/2009  . CHEST PAIN, ATYPICAL 09/02/2008  . GASTRITIS, HX OF 08/25/2008  . HYPOTHYROIDISM 07/22/2008  .  DIVERTICULOSIS-COLON 06/09/2008  . Sciatica of left side 05/06/2008  . VENOUS INSUFFICIENCY 05/03/2008  . GERD 05/03/2008  . NECK PAIN 02/03/2008  . NUMBNESS 01/29/2008  . PULMONARY NODULE 12/26/2007  . HYPERLIPIDEMIA 06/21/2007  . ANEMIA-NOS 06/21/2007  . LEUKOPENIA, CHRONIC 06/21/2007  . ANXIETY 06/21/2007  . DEPRESSION 06/21/2007  . HYPERTENSION 06/21/2007  . CEREBRAL ANEURYSM 06/21/2007  . ALLERGIC RHINITIS 06/21/2007  . IBS 06/21/2007  . Osteoarthrosis, unspecified whether generalized or localized, unspecified site 06/21/2007  . FIBROMYALGIA 06/21/2007  . CEREBROVASCULAR ACCIDENT, HX OF 06/21/2007   Past Medical History:  Diagnosis Date  . ALLERGIC RHINITIS 06/21/2007   Qualifier: Diagnosis of  By: Wynona Luna   . Allergy    allergic rhinitis  . Anemia    nos  . ANEMIA-NOS 06/21/2007   Qualifier: Diagnosis of  By: Wynona Luna   . ANXIETY 06/21/2007   Qualifier: Diagnosis of  By: Wynona Luna   . Arthritis    osteoarthritis  . Atypical chest pain 09/2008   negative myoview  . BACK PAIN, LUMBAR 03/06/2010   Qualifier: Diagnosis of  By: Wynona Luna   . CEREBRAL ANEURYSM 06/21/2007   Qualifier: History of  By: Wynona Luna   . CEREBROVASCULAR ACCIDENT, HX OF 06/21/2007   Qualifier: Diagnosis of  By: Wynona Luna   . CHEST PAIN, ATYPICAL 09/02/2008   Qualifier: Diagnosis of  By: Wynona Luna   . Chronic sinusitis   . CONSTIPATION, CHRONIC 09/20/2009   Qualifier: Diagnosis of  By: Olevia Perches MD, Lowella Bandy   . Depression   . DEPRESSION 06/21/2007   Qualifier: Diagnosis of  By: Wynona Luna   . Diverticulosis   . DIVERTICULOSIS-COLON 06/09/2008   Qualifier: Diagnosis of  By: Chester Holstein NP, Nevin Bloodgood    . Fibromyalgia   . FIBROMYALGIA 06/21/2007   Qualifier: Diagnosis of  By: Wynona Luna   . GASTRITIS, HX OF 08/25/2008   Qualifier: Diagnosis of  By: Nelson-Smith CMA (AAMA), Dottie    . GERD 05/03/2008   Qualifier: Diagnosis of  By: Julien Girt  CMA, Marliss Czar    . GERD (gastroesophageal reflux disease)   . Headache 03/15/2011  . History of solitary pulmonary nodule    right, stable by CT  . Hyperlipidemia   . HYPERLIPIDEMIA 06/21/2007   Qualifier: Diagnosis of  By: Wynona Luna   . Hypertension   . HYPERTENSION 06/21/2007   Qualifier: Diagnosis of  By: Wynona Luna   . HYPOGLYCEMIA 10/24/2010   Qualifier: Diagnosis of  By: Wynona Luna   . HYPOTHYROIDISM 07/22/2008   Qualifier: Diagnosis of  By: Wynona Luna   . IBS 06/21/2007   Qualifier: Diagnosis of  By: Wynona Luna   . IBS (irritable bowel syndrome)   .  INSOMNIA, CHRONIC 07/19/2009   Qualifier: Diagnosis of  By: Wynona Luna   . LEG PAIN, RIGHT 02/01/2009   Qualifier: Diagnosis of  By: Wynona Luna   . Leukopenia    chronic  . LEUKOPENIA, CHRONIC 06/21/2007   Qualifier: Diagnosis of  By: Wynona Luna   . NECK PAIN 02/03/2008   Qualifier: Diagnosis of  By: Wynona Luna   . NUMBNESS 01/29/2008   Qualifier: Diagnosis of  By: Loanne Drilling MD, Jacelyn Pi   . Docia Furl NOS-Unspec 06/21/2007   Centricity Description: DEGENERATIVE JOINT DISEASE, MILD Qualifier: Diagnosis of  By: Wynona Luna  Centricity Description: OSTEOARTHRITIS Qualifier: Diagnosis of  By: Wynona Luna   . PULMONARY NODULE 12/26/2007   Qualifier: Diagnosis of  By: Wynona Luna   . Routine general medical examination at a health care facility   . SCIATICA, RIGHT 05/06/2008   Qualifier: Diagnosis of  By: Nathaneil Canary, CMA, Sarah    . Stroke ([place])   . THROMBOCYTOPENIA 02/09/2009   Qualifier: Diagnosis of  By: Wynona Luna   . UNSTEADY GAIT 07/28/2009   Qualifier: Diagnosis of  By: Wynona Luna   . VENOUS INSUFFICIENCY 05/03/2008   Qualifier: Diagnosis of  By: Julien Girt CMA, Leigh      Family History  Problem Relation Age of Onset  . Cancer Mother        pancreatic  . Coronary artery disease Father   . Cancer Sister         breast  . Asthma Sister   . Breast  cancer Sister   . Cancer Brother        prostate  . Allergies Brother   . Cancer Maternal Aunt        colon  . Diabetes Other   . Cancer Other        ovarian    Past Surgical History:  Procedure Laterality Date  . ABDOMINAL HYSTERECTOMY    . ANEURYSM COILING    . APPENDECTOMY    . CHOLECYSTECTOMY    . JOINT REPLACEMENT     total right knee replacement  . KNEE SURGERY     Social History   Occupational History  . 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 Smoking status: Never Smoker  . Smokeless tobacco: Never Used  Substance and Sexual Activity  . Alcohol use: No  . Drug use: No  . Sexual activity: Not on file

## 2019-11-18 NOTE — Telephone Encounter (Signed)
Received message through Referral Message from W[facility] Flank that Dr. Bo Merino was refusing referral. Faxed required form to [facility] with clinicals and demographics.

## 2019-11-18 NOTE — Addendum Note (Signed)
Addended by: Harriett Sine D on: 11/18/2019 01:08 PM   Modules accepted: Orders

## 2019-11-18 NOTE — Addendum Note (Signed)
Addended by: Precious Bard on: 11/18/2019 05:16 PM   Modules accepted: Orders

## 2019-11-18 NOTE — Telephone Encounter (Signed)
I informed pt Dr. Estanislado Pandy had refused the referral, and I would refer to [facility].

## 2019-11-19 LAB — SYNOVIAL CELL COUNT + DIFF, W/ CRYSTALS
Basophils, %: 0 %
Eosinophils-Synovial: 0 % (ref 0–2)
Lymphocytes-Synovial Fld: 53 % (ref 0–74)
Monocyte/Macrophage: 31 % (ref 0–69)
Neutrophil, Synovial: 15 % (ref 0–24)
Synoviocytes, %: 1 % (ref 0–15)
WBC, Synovial: 291 cells/uL — ABNORMAL HIGH (ref <150)

## 2019-11-19 LAB — TIQ-NTM

## 2019-12-01 ENCOUNTER — Encounter: Payer: Self-pay | Admitting: Internal Medicine

## 2019-12-22 ENCOUNTER — Ambulatory Visit: Payer: Medicare Other | Admitting: Podiatry

## 2019-12-22 ENCOUNTER — Encounter: Payer: Self-pay | Admitting: Podiatry

## 2019-12-22 DIAGNOSIS — B351 Tinea unguium: Secondary | ICD-10-CM

## 2019-12-22 DIAGNOSIS — L84 Corns and callosities: Secondary | ICD-10-CM | POA: Diagnosis not present

## 2019-12-22 DIAGNOSIS — M79675 Pain in left toe(s): Secondary | ICD-10-CM

## 2019-12-22 DIAGNOSIS — M79674 Pain in right toe(s): Secondary | ICD-10-CM | POA: Diagnosis not present

## 2019-12-22 NOTE — Patient Instructions (Signed)

## 2019-12-26 NOTE — Progress Notes (Addendum)
Subjective: Angela Wang presents today for follow up of callus(es) left foot and painful mycotic toenails b/l that are difficult to trim. Pain interferes with ambulation. Aggravating factors include wearing enclosed shoe gear. Pain is relieved with periodic professional debridement..   Allergies  Allergen Reactions  . Cyclobenzaprine Swelling  . Amitriptyline Hcl     REACTION: throat swelling  . Aspirin Other (See Comments)    REACTION: stomach upset  . Citalopram Other (See Comments)  . Escitalopram Oxalate     REACTION: Hallucinations  . Escitalopram Oxalate     REACTION: Hallucinations  . Furosemide Nausea And Vomiting    Abdominal pain  . Gabapentin     GI Upset   . Hydrocodone-Acetaminophen     REACTION: stomach upset  . Lorazepam     REACTION: Throat Swelling  . Oxycodone Other (See Comments)    Headache, change in mental status  . Oxycodone-Acetaminophen     REACTION: Nausea and upset stomach  . Terfenadine     REACTION: stomach upset  . Tramadol Nausea And Vomiting  . Codeine Nausea Only    REACTION: stomach upset REACTION: stomach upset REACTION: stomach upset  . Lactulose Other (See Comments)    Passed out Passed out   . Latex Rash    REACTION: rash REACTION: rash REACTION: rash  . Levofloxacin Nausea And Vomiting and Nausea Only  . Moxifloxacin Nausea And Vomiting and Nausea Only  . Pantoprazole Other (See Comments) and Nausea Only  . Polyethylene Glycol 3350 Other (See Comments)    Makes her bloated Makes her bloated   . Rabeprazole Other (See Comments) and Nausea Only     Objective: There were no vitals filed for this visit.  Vascular Examination:  Capillary fill time to digits <3s b/l, palpable DP pulses b/l, palpable PT pulses b/l, pedal hair sparse b/l and skin temperature gradient within normal limits b/l  Dermatological Examination: Pedal skin with normal turgor, texture and tone bilaterally, no open wounds bilaterally, no interdigital  macerations bilaterally, toenails 2-5 b/l elongated, dystrophic, thickened, crumbly with subungual debris and hyperkeratotic lesion(s) left hallux and posterior left heel.  No erythema, no edema, no drainage, no flocculence.  Anonychia b/l great toe(s) with evidence of permanent total nail avulsion. Nailbed(s) completely epithelialized and intact.  Musculoskeletal: Normal muscle strength 5/5 to all lower extremity muscle groups bilaterally, no gross bony deformities bilaterally and no pain crepitus or joint limitation noted with ROM b/l  Neurological: Protective sensation intact 5/5 intact bilaterally with 10g monofilament b/l  Assessment: 1. Pain due to onychomycosis of toenails of both feet   2. Callus     Plan: -Toenails 1-5 b/l were debrided in length and girth without iatrogenic bleeding. -corns and calluses were debrided without complication or incident. Total number debrided =2 left hallux and left posterior heel. Recommended heel pillow protector for left heel. -Patient to continue soft, supportive shoe gear daily. -Patient to report any pedal injuries to medical professional immediately. -Patient/POA to call should there be question/concern in the interim.  Return in about 9 weeks (around 02/23/2020) for nail and callus trim.

## 2020-01-04 ENCOUNTER — Ambulatory Visit: Payer: Medicare Other | Admitting: Podiatry

## 2020-01-12 ENCOUNTER — Encounter: Payer: Self-pay | Admitting: Orthopaedic Surgery

## 2020-01-12 ENCOUNTER — Ambulatory Visit: Payer: Medicare Other | Admitting: Orthopaedic Surgery

## 2020-01-12 ENCOUNTER — Other Ambulatory Visit: Payer: Self-pay

## 2020-01-12 VITALS — Ht 65.0 in | Wt 180.0 lb

## 2020-01-12 DIAGNOSIS — M79672 Pain in left foot: Secondary | ICD-10-CM | POA: Diagnosis not present

## 2020-01-12 DIAGNOSIS — M79671 Pain in right foot: Secondary | ICD-10-CM | POA: Diagnosis not present

## 2020-01-12 MED ORDER — ACETAMINOPHEN-CODEINE #3 300-30 MG PO TABS: 1.0000 | ORAL_TABLET | Freq: Three times a day (TID) | ORAL | 0 refills | Status: DC | PRN

## 2020-01-12 NOTE — Progress Notes (Signed)
Office Visit Note   Patient: Angela Wang           Date of Birth: 1931/06/19           MRN: [id number] Visit Date: 01/12/2020              Requested by: Elisabeth Cara, [place] [id number] [place],   [id number] PCP: Belva Bertin, [place], [place]   Assessment & Plan: Visit Diagnoses:  1. Bilateral foot pain     Plan: Impression is chronic bilateral foot pain.  Will now refer the patient to Dr. Sharol Given for further evaluation and treatment recommendation.  I have agreed to call in 1 prescription of Tylenol 3.  Follow-up with [place] as needed.  Follow-Up Instructions: Return if symptoms worsen or fail to improve.   Orders:  No orders of the defined types were placed in this encounter.  Meds ordered this encounter  Medications  . acetaminophen-codeine (TYLENOL #3) 300-30 MG tablet    Sig: Take 1 tablet by mouth 3 (three) times daily as needed for moderate pain.    Dispense:  30 tablet    Refill:  0      Procedures: No procedures performed   Clinical Data: No additional findings.   Subjective: Chief Complaint  Patient presents with  . Right Foot - Pain  . Left Foot - Pain    HPI patient is a pleasant 86 year old female who comes in today with recurrent bilateral foot pain right greater than left.  This has been ongoing for the past 6 years without a specific injury or change in activity.  The pain she has is to the entire foot both sides.  This is described as a constant dull pain with occasional sharp shooting pains.  Her pain is aggravated with walking.  She has taken Tylenol and used Voltaren gel without significant relief of symptoms.  She does have a history of peripheral vascular disease and wears compression socks for this.  She has been seen by a podiatrist in the past where she has been sent to physical therapy as well as tried shoe inserts as well as 2-3 different custom shoes all without relief of symptoms.  She does note decreased sensation to  both feet right greater than left.  Previous imaging reveals diffuse degenerative changes throughout.  Review of Systems as detailed in HPI.  All others reviewed and are negative.   Objective: Vital Signs: Ht 5\' 5"  (1.651 m)   Wt 180 lb (81.6 kg)   BMI 29.95 kg/m   Physical Exam well-developed well-nourished female no acute distress.  Alert oriented x3.  Ortho Exam examination of both feet reveal no swelling.  Pes planus.  Marked tenderness throughout the posterior tibial tendon dorsum and plantar aspects of both feet.  Minimal pain with range of motion of the ankle.  Decreased sensation in both feet right greater than left.  Specialty Comments:  No specialty comments available.  Imaging: No new imaging   PMFS History: Patient Active Problem List   Diagnosis Date Noted  . Peripheral vascular disease ([place]) 05/09/2016  . Seasonal allergic rhinitis due to pollen 05/09/2016  . Benign neoplasm of left retina 01/10/2016  . Pseudophakia of both eyes 01/10/2016  . Pseudophakia 01/10/2016  . Retinal edema 01/10/2016  . Decreased mobility and endurance 11/22/2015  . Pain due to knee joint prosthesis ([place]) 10/02/2013  . Cerebral embolism with cerebral infarction ([place]) 07/10/2013  . Bilateral posterior capsular opacification 05/19/2012  .  Branch retinal vein occlusion 02/08/2012  . Cystoid macular edema 02/08/2012  . Macular degeneration 02/08/2012  . Headache(784.0) 03/15/2011  . HYPOGLYCEMIA 10/24/2010  . BACK PAIN, LUMBAR 03/06/2010  . CONSTIPATION, CHRONIC 09/20/2009  . SINUSITIS, CHRONIC 08/16/2009  . UNSTEADY GAIT 07/28/2009  . INSOMNIA, CHRONIC 07/19/2009  . THROMBOCYTOPENIA 02/09/2009  . LEG PAIN, RIGHT 02/01/2009  . Other abnormal glucose 02/01/2009  . CHEST PAIN, ATYPICAL 09/02/2008  . GASTRITIS, HX OF 08/25/2008  . HYPOTHYROIDISM 07/22/2008  . DIVERTICULOSIS-COLON 06/09/2008  . Sciatica of left side 05/06/2008  . VENOUS INSUFFICIENCY 05/03/2008  . GERD 05/03/2008    . NECK PAIN 02/03/2008  . NUMBNESS 01/29/2008  . PULMONARY NODULE 12/26/2007  . HYPERLIPIDEMIA 06/21/2007  . ANEMIA-NOS 06/21/2007  . LEUKOPENIA, CHRONIC 06/21/2007  . ANXIETY 06/21/2007  . DEPRESSION 06/21/2007  . HYPERTENSION 06/21/2007  . CEREBRAL ANEURYSM 06/21/2007  . ALLERGIC RHINITIS 06/21/2007  . IBS 06/21/2007  . Osteoarthrosis, unspecified whether generalized or localized, unspecified site 06/21/2007  . FIBROMYALGIA 06/21/2007  . CEREBROVASCULAR ACCIDENT, HX OF 06/21/2007   Past Medical History:  Diagnosis Date  . ALLERGIC RHINITIS 06/21/2007   Qualifier: Diagnosis of  By: Wynona Luna   . Allergy    allergic rhinitis  . Anemia    nos  . ANEMIA-NOS 06/21/2007   Qualifier: Diagnosis of  By: Wynona Luna   . ANXIETY 06/21/2007   Qualifier: Diagnosis of  By: Wynona Luna   . Arthritis    osteoarthritis  . Atypical chest pain 09/2008   negative myoview  . BACK PAIN, LUMBAR 03/06/2010   Qualifier: Diagnosis of  By: Wynona Luna   . CEREBRAL ANEURYSM 06/21/2007   Qualifier: History of  By: Wynona Luna   . CEREBROVASCULAR ACCIDENT, HX OF 06/21/2007   Qualifier: Diagnosis of  By: Wynona Luna   . CHEST PAIN, ATYPICAL 09/02/2008   Qualifier: Diagnosis of  By: Wynona Luna   . Chronic sinusitis   . CONSTIPATION, CHRONIC 09/20/2009   Qualifier: Diagnosis of  By: Olevia Perches MD, Lowella Bandy   . Depression   . DEPRESSION 06/21/2007   Qualifier: Diagnosis of  By: Wynona Luna   . Diverticulosis   . DIVERTICULOSIS-COLON 06/09/2008   Qualifier: Diagnosis of  By: Chester Holstein NP, Nevin Bloodgood    . Fibromyalgia   . FIBROMYALGIA 06/21/2007   Qualifier: Diagnosis of  By: Wynona Luna   . GASTRITIS, HX OF 08/25/2008   Qualifier: Diagnosis of  By: Nelson-Smith CMA (AAMA), Dottie    . GERD 05/03/2008   Qualifier: Diagnosis of  By: Julien Girt CMA, Marliss Czar    . GERD (gastroesophageal reflux disease)   . Headache 03/15/2011  . History of solitary pulmonary nodule     right, stable by CT  . Hyperlipidemia   . HYPERLIPIDEMIA 06/21/2007   Qualifier: Diagnosis of  By: Wynona Luna   . Hypertension   . HYPERTENSION 06/21/2007   Qualifier: Diagnosis of  By: Wynona Luna   . HYPOGLYCEMIA 10/24/2010   Qualifier: Diagnosis of  By: Wynona Luna   . HYPOTHYROIDISM 07/22/2008   Qualifier: Diagnosis of  By: Wynona Luna   . IBS 06/21/2007   Qualifier: Diagnosis of  By: Wynona Luna   . IBS (irritable bowel syndrome)   . INSOMNIA, CHRONIC 07/19/2009   Qualifier: Diagnosis of  By: Wynona Luna   . LEG PAIN, RIGHT  02/01/2009   Qualifier: Diagnosis of  By: Wynona Luna   . Leukopenia    chronic  . LEUKOPENIA, CHRONIC 06/21/2007   Qualifier: Diagnosis of  By: Wynona Luna   . NECK PAIN 02/03/2008   Qualifier: Diagnosis of  By: Wynona Luna   . NUMBNESS 01/29/2008   Qualifier: Diagnosis of  By: Loanne Drilling MD, Jacelyn Pi   . Docia Furl NOS-Unspec 06/21/2007   Centricity Description: DEGENERATIVE JOINT DISEASE, MILD Qualifier: Diagnosis of  By: Wynona Luna  Centricity Description: OSTEOARTHRITIS Qualifier: Diagnosis of  By: Wynona Luna   . PULMONARY NODULE 12/26/2007   Qualifier: Diagnosis of  By: Wynona Luna   . Routine general medical examination at a health care facility   . SCIATICA, RIGHT 05/06/2008   Qualifier: Diagnosis of  By: Nathaneil Canary, CMA, Sarah    . Stroke ([place])   . THROMBOCYTOPENIA 02/09/2009   Qualifier: Diagnosis of  By: Wynona Luna   . UNSTEADY GAIT 07/28/2009   Qualifier: Diagnosis of  By: Wynona Luna   . VENOUS INSUFFICIENCY 05/03/2008   Qualifier: Diagnosis of  By: Julien Girt CMA, Leigh      Family History  Problem Relation Age of Onset  . Cancer Mother        pancreatic  . Coronary artery disease Father   . Cancer Sister         breast  . Asthma Sister   . Breast cancer Sister   . Cancer Brother        prostate  . Allergies Brother   . Cancer Maternal Aunt        colon  . Diabetes  Other   . Cancer Other        ovarian    Past Surgical History:  Procedure Laterality Date  . ABDOMINAL HYSTERECTOMY    . ANEURYSM COILING    . APPENDECTOMY    . CHOLECYSTECTOMY    . JOINT REPLACEMENT     total right knee replacement  . KNEE SURGERY     Social History   Occupational History  . 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 Smoking status: Never Smoker  . Smokeless tobacco: Never Used  Substance and Sexual Activity  . Alcohol use: No  . Drug use: No  . Sexual activity: Not on file

## 2020-01-13 ENCOUNTER — Encounter: Payer: Self-pay | Admitting: Internal Medicine

## 2020-01-13 ENCOUNTER — Ambulatory Visit: Payer: Medicare Other | Admitting: Internal Medicine

## 2020-01-13 VITALS — BP 144/70 | HR 80 | Temp 98.6°F | Ht 65.0 in | Wt 184.0 lb

## 2020-01-13 DIAGNOSIS — K5900 Constipation, unspecified: Secondary | ICD-10-CM | POA: Diagnosis not present

## 2020-01-13 DIAGNOSIS — K219 Gastro-esophageal reflux disease without esophagitis: Secondary | ICD-10-CM | POA: Diagnosis not present

## 2020-01-13 DIAGNOSIS — R1084 Generalized abdominal pain: Secondary | ICD-10-CM

## 2020-01-13 MED ORDER — OMEPRAZOLE 40 MG PO CPDR: 40.0000 mg | DELAYED_RELEASE_CAPSULE | ORAL | 3 refills | Status: DC

## 2020-01-13 NOTE — Progress Notes (Signed)
HISTORY OF PRESENT ILLNESS:  Angela Wang is a 86 y.o. female with multiple medical problems who presents with a myriad of abdominal complaints.  She inquires about colonoscopy.  Patient was last seen in this office September 07, 2019 by the GI nurse practitioner regarding change in bowel habits as manifested by worsening constipation, generalized chronic abdominal pain most noticeable on the right side (she is status post remote cholecystectomy), and GERD.  See that dictation.  Patient tells me that today's complaints include that same ongoing longstanding generalized abdominal discomfort most noticeable on the right.  Also acid reflux, bloating, intermittent difficulty with constipation, occasional fecal incontinence, and wondering if she should have a colonoscopy.  Patient's recent work-up included CT scan of the abdomen and pelvis with contrast September 11, 2019.  No acute abdominal process was found.  No explanation for right-sided pain.  Pelvic floor laxity noted.  Blood work from October 2020 and November 2020 were unremarkable including normal liver test.  CBC with hemoglobin 11.0.  Sedimentation rate 41.  For her constipation she takes Linzess.  Currently 145 mcg daily.  She takes this about 3 times per month.  She also uses stool softeners and fiber.  For her GERD she request medication refill.  Regarding her abdominal discomfort no exacerbating or relieving factors.  She points to the area of her previous cholecystectomy and is concerned regarding abdominal wall asymmetry.  Her last complete colonoscopy was performed 2011.  The examination was normal except for melanosis and mild diverticulosis.  She has limited mobility due to her arthritis and obesity.  REVIEW OF SYSTEMS:  All non-GI ROS negative unless otherwise stated in the HPI except for sinus and allergy, anxiety, arthritis, back pain, depression, fatigue, muscle cramps, excessive thirst, excessive urination, swollen lower extremities, swollen  lymph glands  Past Medical History:  Diagnosis Date  . ALLERGIC RHINITIS 06/21/2007   Qualifier: Diagnosis of  By: Wynona Luna   . Allergy    allergic rhinitis  . Anemia    nos  . ANEMIA-NOS 06/21/2007   Qualifier: Diagnosis of  By: Wynona Luna   . ANXIETY 06/21/2007   Qualifier: Diagnosis of  By: Wynona Luna   . Arthritis    osteoarthritis  . Atypical chest pain 09/2008   negative myoview  . BACK PAIN, LUMBAR 03/06/2010   Qualifier: Diagnosis of  By: Wynona Luna   . CEREBRAL ANEURYSM 06/21/2007   Qualifier: History of  By: Wynona Luna   . CEREBROVASCULAR ACCIDENT, HX OF 06/21/2007   Qualifier: Diagnosis of  By: Wynona Luna   . CHEST PAIN, ATYPICAL 09/02/2008   Qualifier: Diagnosis of  By: Wynona Luna   . Chronic sinusitis   . CONSTIPATION, CHRONIC 09/20/2009   Qualifier: Diagnosis of  By: Olevia Perches MD, Lowella Bandy   . Depression   . DEPRESSION 06/21/2007   Qualifier: Diagnosis of  By: Wynona Luna   . Diverticulosis   . DIVERTICULOSIS-COLON 06/09/2008   Qualifier: Diagnosis of  By: Chester Holstein NP, Nevin Bloodgood    . Fibromyalgia   . FIBROMYALGIA 06/21/2007   Qualifier: Diagnosis of  By: Wynona Luna   . GASTRITIS, HX OF 08/25/2008   Qualifier: Diagnosis of  By: Nelson-Smith CMA (AAMA), Dottie    . GERD 05/03/2008   Qualifier: Diagnosis of  By: Julien Girt CMA, Marliss Czar    . GERD (gastroesophageal reflux disease)   . Headache 03/15/2011  .  History of solitary pulmonary nodule    right, stable by CT  . Hyperlipidemia   . HYPERLIPIDEMIA 06/21/2007   Qualifier: Diagnosis of  By: Wynona Luna   . Hypertension   . HYPERTENSION 06/21/2007   Qualifier: Diagnosis of  By: Wynona Luna   . HYPOGLYCEMIA 10/24/2010   Qualifier: Diagnosis of  By: Wynona Luna   . HYPOTHYROIDISM 07/22/2008   Qualifier: Diagnosis of  By: Wynona Luna   . IBS 06/21/2007   Qualifier: Diagnosis of  By: Wynona Luna   . IBS (irritable bowel syndrome)   .  INSOMNIA, CHRONIC 07/19/2009   Qualifier: Diagnosis of  By: Wynona Luna   . LEG PAIN, RIGHT 02/01/2009   Qualifier: Diagnosis of  By: Wynona Luna   . Leukopenia    chronic  . LEUKOPENIA, CHRONIC 06/21/2007   Qualifier: Diagnosis of  By: Wynona Luna   . NECK PAIN 02/03/2008   Qualifier: Diagnosis of  By: Wynona Luna   . NUMBNESS 01/29/2008   Qualifier: Diagnosis of  By: Loanne Drilling MD, Jacelyn Pi   . Docia Furl NOS-Unspec 06/21/2007   Centricity Description: DEGENERATIVE JOINT DISEASE, MILD Qualifier: Diagnosis of  By: Wynona Luna  Centricity Description: OSTEOARTHRITIS Qualifier: Diagnosis of  By: Wynona Luna   . PULMONARY NODULE 12/26/2007   Qualifier: Diagnosis of  By: Wynona Luna   . Routine general medical examination at a health care facility   . SCIATICA, RIGHT 05/06/2008   Qualifier: Diagnosis of  By: Nathaneil Canary, CMA, Sarah    . Stroke ([place])   . THROMBOCYTOPENIA 02/09/2009   Qualifier: Diagnosis of  By: Wynona Luna   . UNSTEADY GAIT 07/28/2009   Qualifier: Diagnosis of  By: Wynona Luna   . VENOUS INSUFFICIENCY 05/03/2008   Qualifier: Diagnosis of  By: Julien Girt CMA, Marliss Czar      Past Surgical History:  Procedure Laterality Date  . ABDOMINAL HYSTERECTOMY    . ANEURYSM COILING    . APPENDECTOMY    . CHOLECYSTECTOMY    . JOINT REPLACEMENT     total right knee replacement  . KNEE SURGERY      Social History Angela Wang  reports that she has never smoked. She has never used smokeless tobacco. She reports that she does not drink alcohol or use drugs.  family history includes Allergies in her brother; Asthma in her sister; Breast cancer in her sister; Cancer in her brother, maternal aunt, mother, sister, and another family member; Coronary artery disease in her father; Diabetes in an other family member.  Allergies  Allergen Reactions  . Cyclobenzaprine Swelling  . Amitriptyline Hcl     REACTION: throat swelling  . Aspirin Other (See Comments)     REACTION: stomach upset  . Citalopram Other (See Comments)  . Escitalopram Oxalate     REACTION: Hallucinations  . Escitalopram Oxalate     REACTION: Hallucinations  . Furosemide Nausea And Vomiting    Abdominal pain  . Gabapentin     GI Upset   . Hydrocodone-Acetaminophen     REACTION: stomach upset  . Lorazepam     REACTION: Throat Swelling  . Oxycodone Other (See Comments)    Headache, change in mental status  . Oxycodone-Acetaminophen     REACTION: Nausea and upset stomach  . Terfenadine     REACTION: stomach upset  . Tramadol Nausea  And Vomiting  . Codeine Nausea Only    REACTION: stomach upset REACTION: stomach upset REACTION: stomach upset  . Lactulose Other (See Comments)    Passed out Passed out   . Latex Rash    REACTION: rash REACTION: rash REACTION: rash  . Levofloxacin Nausea And Vomiting and Nausea Only  . Moxifloxacin Nausea And Vomiting and Nausea Only  . Pantoprazole Other (See Comments) and Nausea Only  . Polyethylene Glycol 3350 Other (See Comments)    Makes her bloated Makes her bloated   . Rabeprazole Other (See Comments) and Nausea Only       PHYSICAL EXAMINATION: Vital signs: BP (!) 144/70   Pulse 80   Temp 98.6 F (37 C)   Ht '5\' 5"'  (1.651 m)   Wt 184 lb (83.5 kg)   BMI 30.62 kg/m   Constitutional: Pleasant overweight elderly female, no acute distress Psychiatric: alert and oriented x3, cooperative Eyes: extraocular movements intact, anicteric, conjunctiva pink Mouth: oral pharynx moist, no lesions Neck: supple no lymphadenopathy Cardiovascular: heart regular rate and rhythm, no murmur Lungs: clear to auscultation bilaterally Abdomen: soft, obese, nontender, nondistended, no obvious ascites, no peritoneal signs, normal bowel sounds, no organomegaly Rectal: Omitted Extremities: no clubbing or cyanosis.  1+ lower extremity edema bilaterally Skin: no lesions on visible extremities Neuro: No focal deficits.  Cranial nerves  intact  ASSESSMENT:  1.  Chronic functional abdominal pain.  Ongoing. 2.  Abdominal wall asymmetry secondary to large incision from prior cholecystectomy 3.  Chronic constipation.  Ongoing 4.  Normal colonoscopy 2011 and normal CT scan 2020.  No need to repeat either 5.  Morbid obesity.   6.  Chronic GERD.  On PPI.  Symptoms controlled.  She requests additional PPI   PLAN:  1.  Provided additional samples of Linzess 145 mcg daily 2.  Prescribed omeprazole 40 mg daily for chronic GERD 3.  Reflux precautions 4.  Reassurance regarding abdominal wall asymmetry 5.  Discussion on the value or lack thereof of colonoscopy in this particular patient A total time of 30 minutes was spent preparing to see the patient, reviewing laboratories and x-rays, obtaining comprehensive history, performing medically appropriate examination, counseling the patient regarding her above listed issues, ordering and providing medications, and documenting clinical information in her health record

## 2020-01-13 NOTE — Patient Instructions (Signed)
We have sent the following medications to your pharmacy for you to pick up at your convenience:  Omeprazole  I have given you some samples of Linzess 60mcg; Take 2 capsules 30 minutes before breakfast

## 2020-01-18 ENCOUNTER — Ambulatory Visit: Payer: Medicare Other | Admitting: Orthopedic Surgery

## 2020-01-18 ENCOUNTER — Encounter: Payer: Self-pay | Admitting: Orthopedic Surgery

## 2020-01-18 ENCOUNTER — Other Ambulatory Visit: Payer: Self-pay

## 2020-01-18 VITALS — Ht 65.0 in | Wt 184.0 lb

## 2020-01-18 DIAGNOSIS — M6701 Short Achilles tendon (acquired), right ankle: Secondary | ICD-10-CM

## 2020-01-18 DIAGNOSIS — M6702 Short Achilles tendon (acquired), left ankle: Secondary | ICD-10-CM | POA: Diagnosis not present

## 2020-01-18 DIAGNOSIS — M79671 Pain in right foot: Secondary | ICD-10-CM

## 2020-01-18 DIAGNOSIS — M79672 Pain in left foot: Secondary | ICD-10-CM

## 2020-01-18 MED ORDER — ACETAMINOPHEN-CODEINE #3 300-30 MG PO TABS: 1.0000 | ORAL_TABLET | Freq: Three times a day (TID) | ORAL | 0 refills | Status: DC | PRN

## 2020-01-19 ENCOUNTER — Encounter: Payer: Self-pay | Admitting: Orthopedic Surgery

## 2020-01-19 NOTE — Progress Notes (Signed)
Office Visit Note   Patient: Angela Wang           Date of Birth: 15-May-1931           MRN: [id number] Visit Date: 01/18/2020              Requested by: Elisabeth Cara, PA-C [id number] [place],  [place] [address] PCP: Belva Bertin, Bennetta La[place], [place]  Chief Complaint  Patient presents with  . Left Foot - Pain  . Right Foot - Pain      HPI: Patient is a 86 year old woman who was seen for initial evaluation from Dr.Xu.  Patient complains of bilateral foot pain for 3 years denies any specific injury.  Patient states that her foot pain is gotten worse since her total knee replacement in 2015.  Patient complains of pain across the forefoot plantar aspect.  Assessment & Plan: Visit Diagnoses:  1. Bilateral foot pain   2. Achilles tendon contracture, bilateral     Plan: Patient was given instructions for Achilles stretching and this was demonstrated to her.  Discussed that Achilles stretching was necessary to unload the forefoot and relieve her pain.  Discussed that if we cannot achieve this with conservative treatment a gastrocnemius recession is an option.  Follow-Up Instructions: Return in about 4 weeks (around 02/15/2020).   Ortho Exam  Patient is alert, oriented, no adenopathy, well-dressed, normal affect, normal respiratory effort. Examination patient is wearing compression stockings she does have venous stasis insufficiency.  She has good pulses bilaterally she is currently wearing [place] sneakers.  She has significant Achilles contracture worse on the right than left with dorsiflexion 20 degrees short of neutral on the right with her knee extended dorsiflexion just short of neutral on the left with her knee extended.  Patient has pain to palpation across the forefoot.  No plantar fascial pain no tenderness to palpation over the Achilles.  Imaging: No results found. No images are attached to the encounter.  Labs: Lab Results  Component Value Date   HGBA1C  6.0 (H) 12/10/2016   HGBA1C 5.8 06/29/2013   HGBA1C 6.0 (H) 10/17/2010   ESRSEDRATE 41 (H) 10/10/2019   ESRSEDRATE 34 (H) 10/28/2017   ESRSEDRATE 28 (H) 01/29/2008   CRP 2.5 10/28/2017   REPTSTATUS 07/23/2009 FINAL 07/22/2009   CULT INSIGNIFICANT GROWTH 07/22/2009     Lab Results  Component Value Date   ALBUMIN 4.0 09/07/2019   ALBUMIN 3.8 06/29/2013   ALBUMIN 3.5 07/22/2009    No results found for: MG Lab Results  Component Value Date   VD25OH 31 06/29/2013    No results found for: PREALBUMIN CBC EXTENDED Latest Ref Rng & Units 10/10/2019 09/07/2019 10/28/2017  WBC 4.0 - 10.5 K/uL 5.6 5.5 5.6  RBC 3.87 - 5.11 MIL/uL 4.41 4.38 4.25  HGB 12.0 - 15.0 g/dL 11.0(L) 11.3(L) 10.9(L)  HCT 36.0 - 46.0 % 37.2 36.2 35.1  PLT 150 - 400 K/uL 180 203.0 209  NEUTROABS 1.7 - 7.7 K/uL 2.4 2.5 2,531  LYMPHSABS 0.7 - 4.0 K/uL 2.2 2.2 2,218     Body mass index is 30.62 kg/m.  Orders:  No orders of the defined types were placed in this encounter.  Meds ordered this encounter  Medications  . acetaminophen-codeine (TYLENOL #3) 300-30 MG tablet    Sig: Take 1 tablet by mouth every 8 (eight) hours as needed for moderate pain.    Dispense:  15 tablet    Refill:  0  Procedures: No procedures performed  Clinical Data: No additional findings.  ROS:  All other systems negative, except as noted in the HPI. Review of Systems  Objective: Vital Signs: Ht 5\' 5"  (1.651 m)   Wt 184 lb (83.5 kg)   BMI 30.62 kg/m   Specialty Comments:  No specialty comments available.  PMFS History: Patient Active Problem List   Diagnosis Date Noted  . Peripheral vascular disease ([place]) 05/09/2016  . Seasonal allergic rhinitis due to pollen 05/09/2016  . Benign neoplasm of left retina 01/10/2016  . Pseudophakia of both eyes 01/10/2016  . Pseudophakia 01/10/2016  . Retinal edema 01/10/2016  . Decreased mobility and endurance 11/22/2015  . Pain due to knee joint prosthesis ([place]) 10/02/2013    . Cerebral embolism with cerebral infarction ([place]) 07/10/2013  . Bilateral posterior capsular opacification 05/19/2012  . Branch retinal vein occlusion 02/08/2012  . Cystoid macular edema 02/08/2012  . Macular degeneration 02/08/2012  . Headache(784.0) 03/15/2011  . HYPOGLYCEMIA 10/24/2010  . BACK PAIN, LUMBAR 03/06/2010  . CONSTIPATION, CHRONIC 09/20/2009  . SINUSITIS, CHRONIC 08/16/2009  . UNSTEADY GAIT 07/28/2009  . INSOMNIA, CHRONIC 07/19/2009  . THROMBOCYTOPENIA 02/09/2009  . LEG PAIN, RIGHT 02/01/2009  . Other abnormal glucose 02/01/2009  . CHEST PAIN, ATYPICAL 09/02/2008  . GASTRITIS, HX OF 08/25/2008  . HYPOTHYROIDISM 07/22/2008  . DIVERTICULOSIS-COLON 06/09/2008  . Sciatica of left side 05/06/2008  . VENOUS INSUFFICIENCY 05/03/2008  . GERD 05/03/2008  . NECK PAIN 02/03/2008  . NUMBNESS 01/29/2008  . PULMONARY NODULE 12/26/2007  . HYPERLIPIDEMIA 06/21/2007  . ANEMIA-NOS 06/21/2007  . LEUKOPENIA, CHRONIC 06/21/2007  . ANXIETY 06/21/2007  . DEPRESSION 06/21/2007  . HYPERTENSION 06/21/2007  . CEREBRAL ANEURYSM 06/21/2007  . ALLERGIC RHINITIS 06/21/2007  . IBS 06/21/2007  . Osteoarthrosis, unspecified whether generalized or localized, unspecified site 06/21/2007  . FIBROMYALGIA 06/21/2007  . CEREBROVASCULAR ACCIDENT, HX OF 06/21/2007   Past Medical History:  Diagnosis Date  . ALLERGIC RHINITIS 06/21/2007   Qualifier: Diagnosis of  By: Wynona Luna   . Allergy    allergic rhinitis  . Anemia    nos  . ANEMIA-NOS 06/21/2007   Qualifier: Diagnosis of  By: Wynona Luna   . ANXIETY 06/21/2007   Qualifier: Diagnosis of  By: Wynona Luna   . Arthritis    osteoarthritis  . Atypical chest pain 09/2008   negative myoview  . BACK PAIN, LUMBAR 03/06/2010   Qualifier: Diagnosis of  By: Wynona Luna   . CEREBRAL ANEURYSM 06/21/2007   Qualifier: History of  By: Wynona Luna   . CEREBROVASCULAR ACCIDENT, HX OF 06/21/2007   Qualifier: Diagnosis of   By: Wynona Luna   . CHEST PAIN, ATYPICAL 09/02/2008   Qualifier: Diagnosis of  By: Wynona Luna   . Chronic sinusitis   . CONSTIPATION, CHRONIC 09/20/2009   Qualifier: Diagnosis of  By: Olevia Perches MD, Lowella Bandy   . Depression   . DEPRESSION 06/21/2007   Qualifier: Diagnosis of  By: Wynona Luna   . Diverticulosis   . DIVERTICULOSIS-COLON 06/09/2008   Qualifier: Diagnosis of  By: Chester Holstein NP, Nevin Bloodgood    . Fibromyalgia   . FIBROMYALGIA 06/21/2007   Qualifier: Diagnosis of  By: Wynona Luna   . GASTRITIS, HX OF 08/25/2008   Qualifier: Diagnosis of  By: Nelson-Smith CMA (AAMA), Dottie    . GERD 05/03/2008   Qualifier: Diagnosis of  By: Julien Girt CMA, Marliss Czar    .  GERD (gastroesophageal reflux disease)   . Headache 03/15/2011  . History of solitary pulmonary nodule    right, stable by CT  . Hyperlipidemia   . HYPERLIPIDEMIA 06/21/2007   Qualifier: Diagnosis of  By: Wynona Luna   . Hypertension   . HYPERTENSION 06/21/2007   Qualifier: Diagnosis of  By: Wynona Luna   . HYPOGLYCEMIA 10/24/2010   Qualifier: Diagnosis of  By: Wynona Luna   . HYPOTHYROIDISM 07/22/2008   Qualifier: Diagnosis of  By: Wynona Luna   . IBS 06/21/2007   Qualifier: Diagnosis of  By: Wynona Luna   . IBS (irritable bowel syndrome)   . INSOMNIA, CHRONIC 07/19/2009   Qualifier: Diagnosis of  By: Wynona Luna   . LEG PAIN, RIGHT 02/01/2009   Qualifier: Diagnosis of  By: Wynona Luna   . Leukopenia    chronic  . LEUKOPENIA, CHRONIC 06/21/2007   Qualifier: Diagnosis of  By: Wynona Luna   . NECK PAIN 02/03/2008   Qualifier: Diagnosis of  By: Wynona Luna   . NUMBNESS 01/29/2008   Qualifier: Diagnosis of  By: Loanne Drilling MD, Jacelyn Pi   . Docia Furl NOS-Unspec 06/21/2007   Centricity Description: DEGENERATIVE JOINT DISEASE, MILD Qualifier: Diagnosis of  By: Wynona Luna  Centricity Description: OSTEOARTHRITIS Qualifier: Diagnosis of  By: Wynona Luna   . PULMONARY NODULE  12/26/2007   Qualifier: Diagnosis of  By: Wynona Luna   . Routine general medical examination at a health care facility   . SCIATICA, RIGHT 05/06/2008   Qualifier: Diagnosis of  By: Nathaneil Canary, CMA, Sarah    . Stroke ([place])   . THROMBOCYTOPENIA 02/09/2009   Qualifier: Diagnosis of  By: Wynona Luna   . UNSTEADY GAIT 07/28/2009   Qualifier: Diagnosis of  By: Wynona Luna   . VENOUS INSUFFICIENCY 05/03/2008   Qualifier: Diagnosis of  By: Julien Girt CMA, Leigh      Family History  Problem Relation Age of Onset  . Cancer Mother        pancreatic  . Coronary artery disease Father   . Cancer Sister         breast  . Asthma Sister   . Breast cancer Sister   . Cancer Brother        prostate  . Allergies Brother   . Cancer Maternal Aunt        colon  . Diabetes Other   . Cancer Other        ovarian    Past Surgical History:  Procedure Laterality Date  . ABDOMINAL HYSTERECTOMY    . ANEURYSM COILING    . APPENDECTOMY    . CHOLECYSTECTOMY    . JOINT REPLACEMENT     total right knee replacement  . KNEE SURGERY     Social History   Occupational History  . 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 Smoking status: Never Smoker  . Smokeless tobacco: Never Used  Substance and Sexual Activity  . Alcohol use: No  . Drug use: No  . Sexual activity: Not on file

## 2020-01-25 IMAGING — CT CT CERVICAL SPINE W/O CM
4 of 7 series · 13 of 33 positions shown, 14 images · non-contrast
Comparison: Head CT January 09, 2011

CLINICAL DATA: Headache with right neck pain.

EXAM:
CT HEAD WITHOUT CONTRAST
CT CERVICAL SPINE WITHOUT CONTRAST
TECHNIQUE: Multidetector CT imaging of the head and cervical spine was
performed following the standard protocol without intravenous
contrast. Multiplanar CT image reconstructions of the cervical spine
were also generated.

[Series 6: c_spine 2.0 i30s 3 · axial · 0.34mm/px · z∈[+542,+618]mm · 3 of 78 slices shown]
[im 20/78  bone]
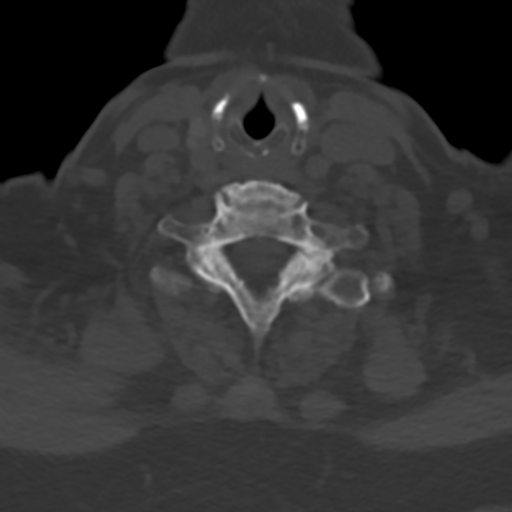
[im 39/78  bone]
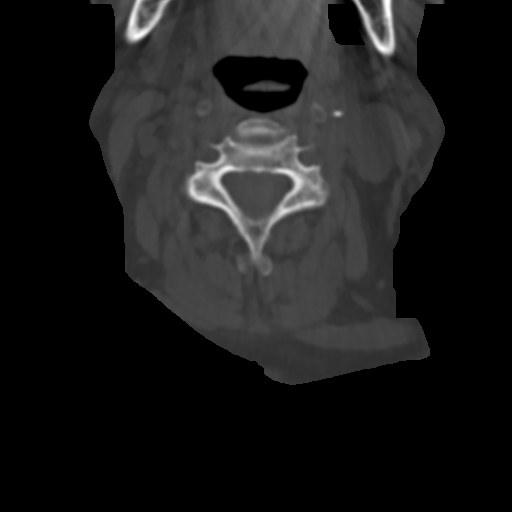
[im 58/78  bone]
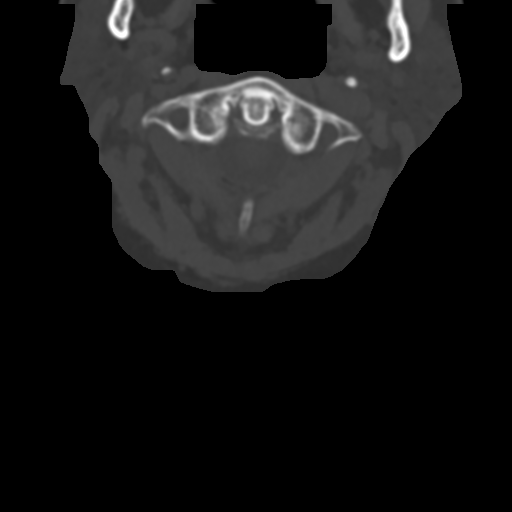

[Series 8: coronals · coronal · 0.24mm/px · 1 of 50 slices shown]
[im 25/50  bone]
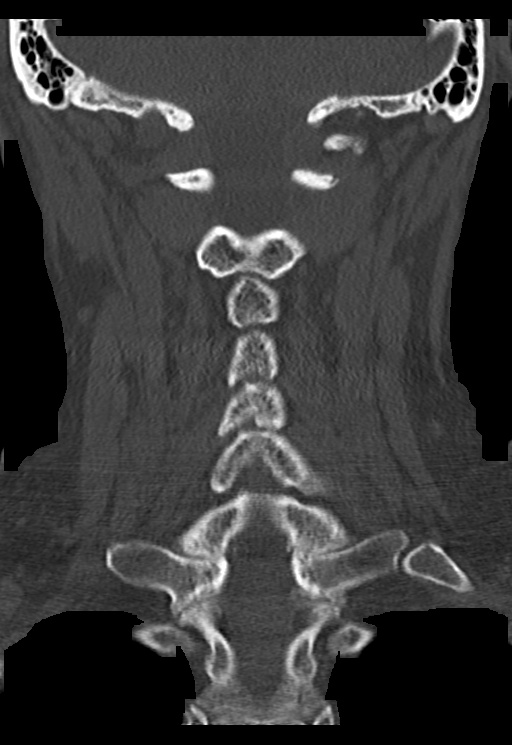

[Series 9: sagittals · sagittal · 0.23mm/px · 5 of 63 slices shown]
[im 11/63  bone]
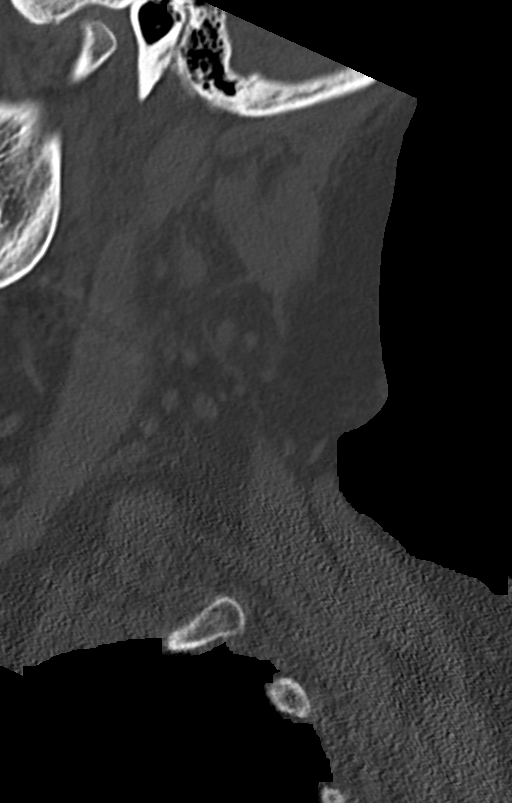
[im 21/63  bone]
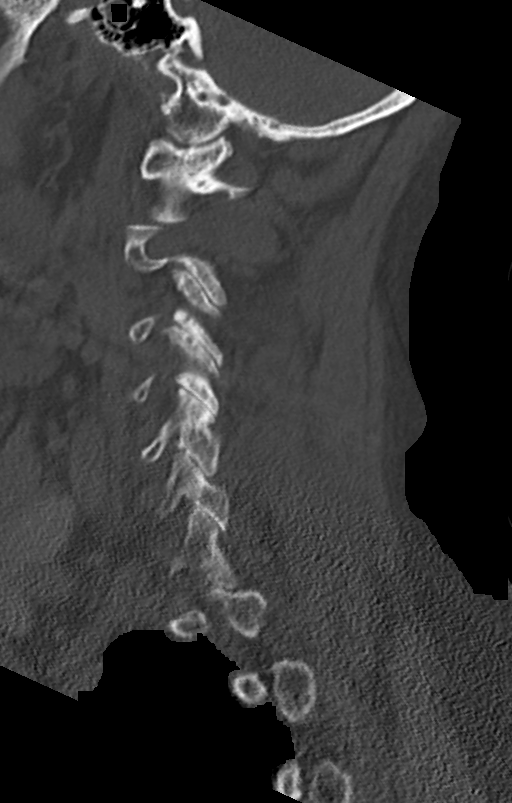
[im 32/63  bone]
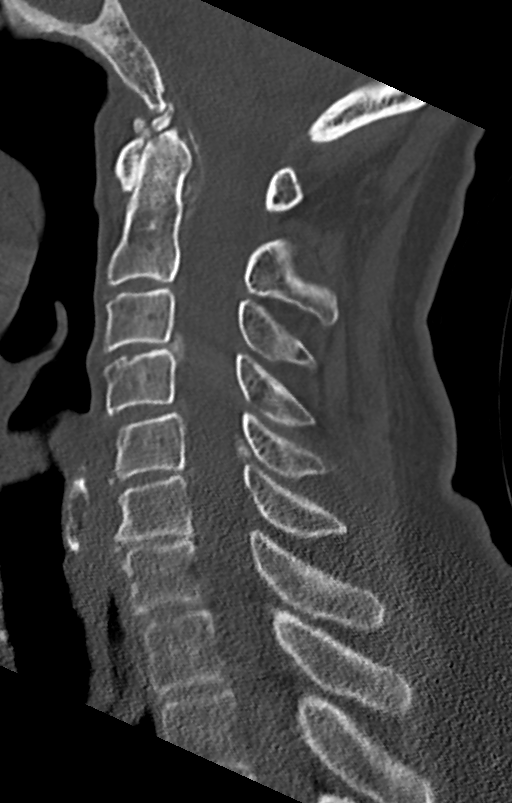
[im 42/63  bone]
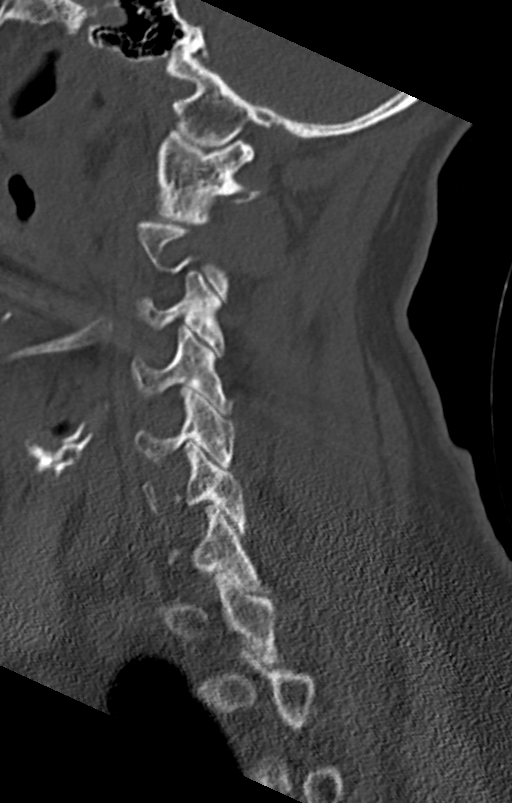
[im 52/63  bone]
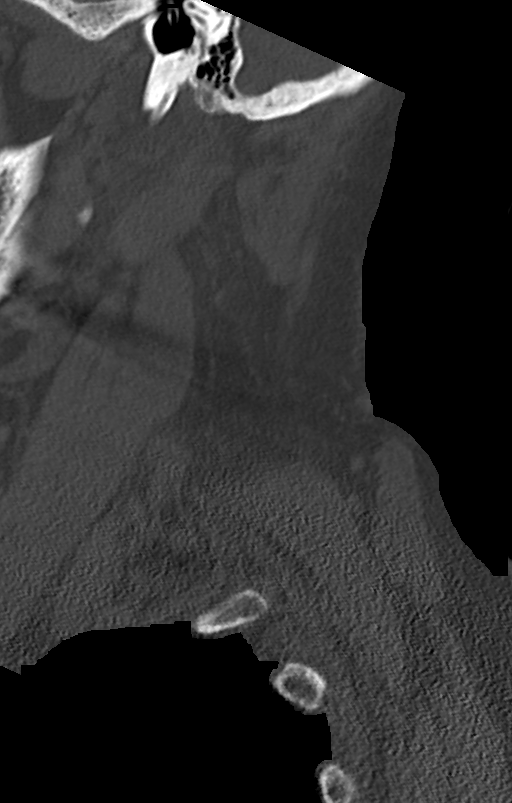

[Series 10: orthogonals · axial · 0.20mm/px · z∈[+510,+608]mm · 4 of 92 slices shown, 5 images]
[im 19/92  soft-tissue]
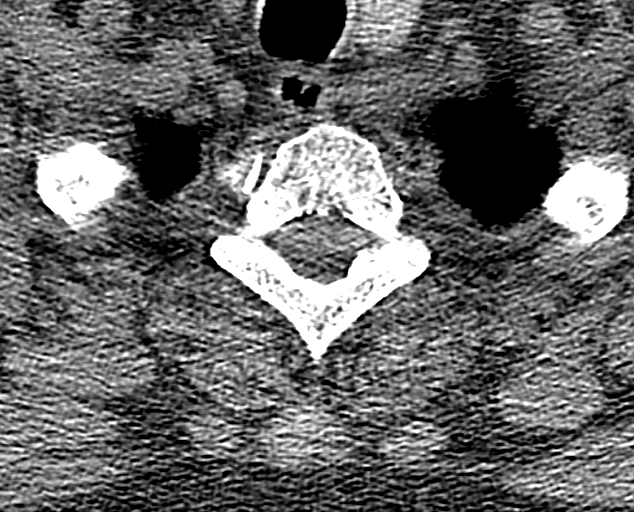
[im 19/92  bone]
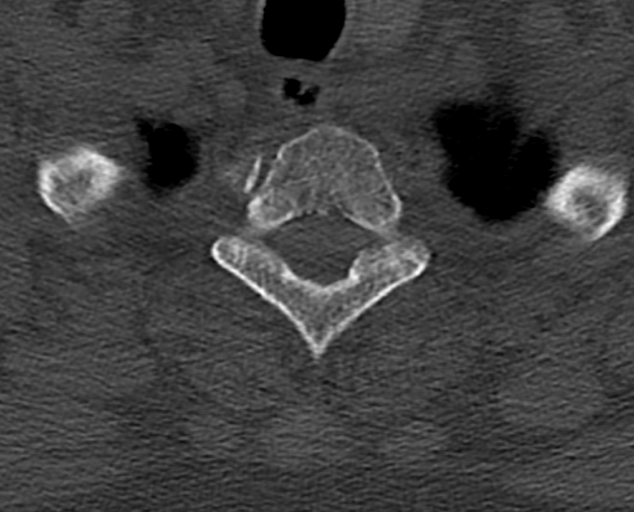
[im 37/92  bone]
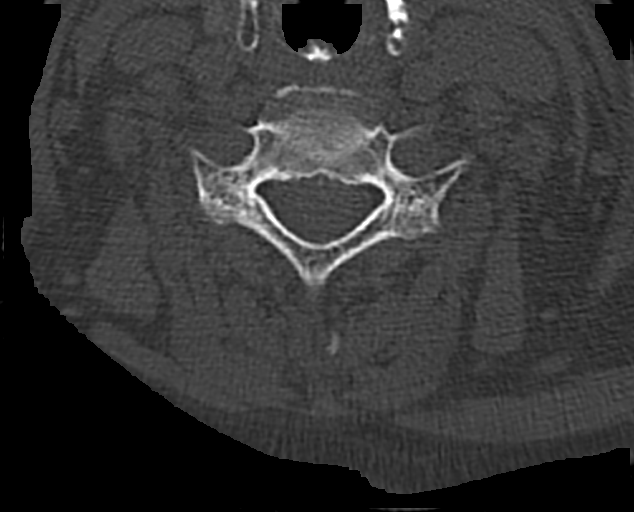
[im 55/92  bone]
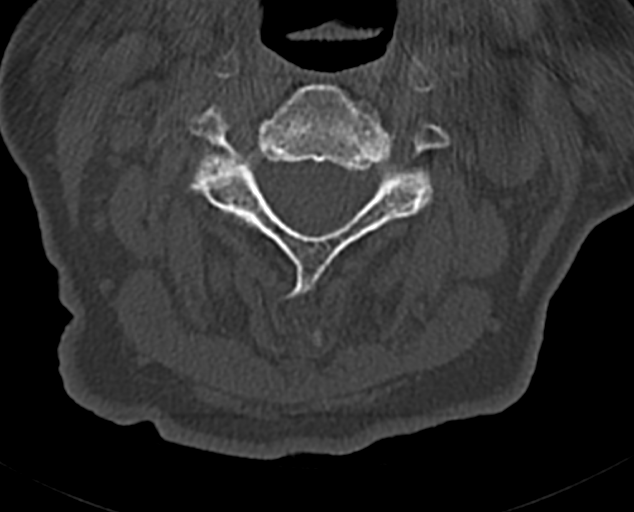
[im 73/92  bone]
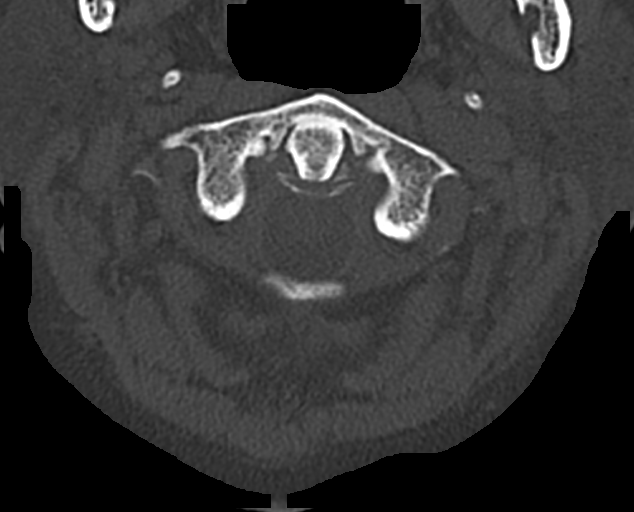

[13 of 33 positions shown; findings below may reference images not displayed]

FINDINGS: CT HEAD FINDINGS

Brain: No evidence of acute infarction, hemorrhage, hydrocephalus,
extra-axial collection or mass lesion/mass effect. Aneurysm coil is
identified in the region of right middle cerebral artery unchanged.

Vascular: Aneurysm coil is identified in the region of right middle
cerebral artery unchanged. No hyperdense vessel is noted.

Skull: Normal. Negative for fracture or focal lesion.

Sinuses/Orbits: Complete opacity with dense material is identified
within the right maxillary sinus unchanged compared to prior CT of
4714. There is air-fluid level with mucoperiosteal thickening
involving the right sphenoid sinus. Mucoperiosteal thickening of
bilateral ethmoid sinuses are noted.

Other: None

CT CERVICAL SPINE FINDINGS

Alignment: Normal.

Skull base and vertebrae: No acute fracture. No primary bone lesion
or focal pathologic process.

Soft tissues and spinal canal: No prevertebral fluid or swelling. No
visible canal hematoma.

Disc levels: There are marked osteoarthritic changes the C1 and C2
level. Mild osteoarthritic changes are identified elsewhere in the
cervical spine.

Upper chest: Negative.

Other: None.
IMPRESSION: No focal acute intracranial abnormality identified.

Findings consistent with acute sphenoid sinusitis.

No acute abnormality identified in the cervical spine. Degenerative
joint changes are noted.

## 2020-02-15 ENCOUNTER — Ambulatory Visit: Payer: Medicare Other | Admitting: Orthopedic Surgery

## 2020-02-23 ENCOUNTER — Ambulatory Visit: Payer: Medicare Other | Admitting: Podiatry

## 2020-04-26 ENCOUNTER — Other Ambulatory Visit: Payer: Self-pay | Admitting: Obstetrics and Gynecology

## 2020-04-26 DIAGNOSIS — Z1231 Encounter for screening mammogram for malignant neoplasm of breast: Secondary | ICD-10-CM

## 2020-05-04 ENCOUNTER — Ambulatory Visit: Payer: Medicare Other | Admitting: Podiatry

## 2020-06-09 ENCOUNTER — Ambulatory Visit: Payer: Medicare Other

## 2020-06-28 ENCOUNTER — Ambulatory Visit: Payer: Medicare Other

## 2020-08-23 ENCOUNTER — Ambulatory Visit
Admission: RE | Admit: 2020-08-23 | Discharge: 2020-08-23 | Disposition: A | Discharge status: Home / Self Care | Payer: Medicare Other | Source: Ambulatory Visit | Attending: Obstetrics and Gynecology | Admitting: Obstetrics and Gynecology

## 2020-08-23 ENCOUNTER — Other Ambulatory Visit: Payer: Self-pay

## 2020-08-23 DIAGNOSIS — Z1231 Encounter for screening mammogram for malignant neoplasm of breast: Secondary | ICD-10-CM

## 2020-08-25 ENCOUNTER — Telehealth: Payer: Self-pay

## 2020-08-25 NOTE — Telephone Encounter (Signed)
NOTES ON FILE FROM [employer] [facility] [phone], SENT REFRRAL TO [place]

## 2020-09-19 ENCOUNTER — Ambulatory Visit: Payer: Medicare Other | Admitting: Internal Medicine

## 2020-09-19 NOTE — Progress Notes (Deleted)
Cardiology Office Note:    Date:  09/19/2020   ID:  Angela Wang, DOB [birth year], MRN [id number]  PCP:  Elisabeth Cara, [place] Cardiologist:  No primary care provider on file.  [facility] Electrophysiologist:  None   CC:  Palpitations Consulted for the evaluation of palpitations at the [facility] [place], [place], [place]  History of Present Illness:    Angela Wang is a 86 y.o. female with a hx of peripheral arterial disease, venous insufficiency, thrombocytopenia, HLD,   Past Medical History:  Diagnosis Date  . ALLERGIC RHINITIS 06/21/2007   Qualifier: Diagnosis of  By: Wynona Luna   . Allergy    allergic rhinitis  . Anemia    nos  . ANEMIA-NOS 06/21/2007   Qualifier: Diagnosis of  By: Wynona Luna   . ANXIETY 06/21/2007   Qualifier: Diagnosis of  By: Wynona Luna   . Arthritis    osteoarthritis  . Atypical chest pain 09/2008   negative myoview  . BACK PAIN, LUMBAR 03/06/2010   Qualifier: Diagnosis of  By: Wynona Luna   . CEREBRAL ANEURYSM 06/21/2007   Qualifier: History of  By: Wynona Luna   . CEREBROVASCULAR ACCIDENT, HX OF 06/21/2007   Qualifier: Diagnosis of  By: Wynona Luna   . CHEST PAIN, ATYPICAL 09/02/2008   Qualifier: Diagnosis of  By: Wynona Luna   . Chronic sinusitis   . CONSTIPATION, CHRONIC 09/20/2009   Qualifier: Diagnosis of  By: Olevia Perches MD, Lowella Bandy   . Depression   . DEPRESSION 06/21/2007   Qualifier: Diagnosis of  By: Wynona Luna   . Diverticulosis   . DIVERTICULOSIS-COLON 06/09/2008   Qualifier: Diagnosis of  By: Chester Holstein NP, Nevin Bloodgood    . Fibromyalgia   . FIBROMYALGIA 06/21/2007   Qualifier: Diagnosis of  By: Wynona Luna   . GASTRITIS, HX OF 08/25/2008   Qualifier: Diagnosis of  By: Nelson-Smith CMA (AAMA), Dottie    . GERD 05/03/2008   Qualifier: Diagnosis of  By: Julien Girt CMA, Marliss Czar    . GERD (gastroesophageal reflux disease)   . Headache 03/15/2011  . History of solitary  pulmonary nodule    right, stable by CT  . Hyperlipidemia   . HYPERLIPIDEMIA 06/21/2007   Qualifier: Diagnosis of  By: Wynona Luna   . Hypertension   . HYPERTENSION 06/21/2007   Qualifier: Diagnosis of  By: Wynona Luna   . HYPOGLYCEMIA 10/24/2010   Qualifier: Diagnosis of  By: Wynona Luna   . HYPOTHYROIDISM 07/22/2008   Qualifier: Diagnosis of  By: Wynona Luna   . IBS 06/21/2007   Qualifier: Diagnosis of  By: Wynona Luna   . IBS (irritable bowel syndrome)   . INSOMNIA, CHRONIC 07/19/2009   Qualifier: Diagnosis of  By: Wynona Luna   . LEG PAIN, RIGHT 02/01/2009   Qualifier: Diagnosis of  By: Wynona Luna   . Leukopenia    chronic  . LEUKOPENIA, CHRONIC 06/21/2007   Qualifier: Diagnosis of  By: Wynona Luna   . NECK PAIN 02/03/2008   Qualifier: Diagnosis of  By: Wynona Luna   . NUMBNESS 01/29/2008   Qualifier: Diagnosis of  By: Loanne Drilling MD, Jacelyn Pi   . Docia Furl NOS-Unspec 06/21/2007   Centricity Description: DEGENERATIVE JOINT DISEASE, MILD Qualifier: Diagnosis of  By: Shawna Orleans  DO, DHerbie Baltimore  Centricity Description: OSTEOARTHRITIS Qualifier: Diagnosis of  By: Wynona Luna   . PULMONARY NODULE 12/26/2007   Qualifier: Diagnosis of  By: Wynona Luna   . Routine general medical examination at a health care facility   . SCIATICA, RIGHT 05/06/2008   Qualifier: Diagnosis of  By: Nathaneil Canary, CMA, Sarah    . Stroke ([place])   . THROMBOCYTOPENIA 02/09/2009   Qualifier: Diagnosis of  By: Wynona Luna   . UNSTEADY GAIT 07/28/2009   Qualifier: Diagnosis of  By: Wynona Luna   . VENOUS INSUFFICIENCY 05/03/2008   Qualifier: Diagnosis of  By: Julien Girt CMA, Marliss Czar     Past Surgical History:  Procedure Laterality Date  . ABDOMINAL HYSTERECTOMY    . ANEURYSM COILING    . APPENDECTOMY    . CHOLECYSTECTOMY    . JOINT REPLACEMENT     total right knee replacement  . KNEE SURGERY     Current Medications: No outpatient medications have been marked as  taking for the 09/19/20 encounter (Appointment) with Werner Lean, MD.     Allergies:   Cyclobenzaprine, Amitriptyline hcl, Aspirin, Citalopram, Escitalopram oxalate, Escitalopram oxalate, Furosemide, Gabapentin, Hydrocodone-acetaminophen, Lorazepam, Oxycodone, Oxycodone-acetaminophen, Terfenadine, Tramadol, Codeine, Lactulose, Latex, Levofloxacin, Moxifloxacin, Pantoprazole, Polyethylene glycol 3350, and Rabeprazole   Social History   Socioeconomic History  . Marital status: Widowed    Spouse name: Not on file  . Number of children: 4  . Years of education: Not on file  . Highest education level: Not on file  Occupational History  . 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 Smoking status: Never Smoker  . Smokeless tobacco: Never Used  Substance and Sexual Activity  . Alcohol use: No  . Drug use: No  . Sexual activity: Not on file  Other Topics Concern  . Not on file  Social History Narrative  . Not on file   Social Determinants of Health   Financial Resource Strain:   . Difficulty of Paying Living Expenses: Not on file  Food Insecurity:   . Worried About Charity fundraiser in the Last Year: Not on file  . Ran Out of Food in the Last Year: Not on file  Transportation Needs:   . Lack of Transportation (Medical): Not on file  . Lack of Transportation (Non-Medical): Not on file  Physical Activity:   . Days of Exercise per Week: Not on file  . Minutes of Exercise per Session: Not on file  Stress:   . Feeling of Stress : Not on file  Social Connections:   . Frequency of Communication with Friends and Family: Not on file  . Frequency of Social Gatherings with Friends and Family: Not on file  . Attends Religious Services: Not on file  . Active Member of Clubs or Organizations: Not on file  . Attends Archivist Meetings: Not on file  . Marital Status: Not on file    Family History: The patient's ***family  history includes Allergies in her brother; Asthma in her sister; Breast cancer in her sister; Cancer in her brother, maternal aunt, mother, sister, and another family member; Coronary artery disease in her father; Diabetes in an other family member.  ROS:   Please see the history of present illness.    *** All other systems reviewed and are negative.  EKGs/Labs/Other Studies Reviewed:    The following studies were reviewed  today: ***  EKG:  EKG is *** ordered today.  The ekg ordered today demonstrates ***  Recent Labs: 10/10/2019: BUN 13; Creatinine, Ser 0.86; Hemoglobin 11.0; Platelets 180; Potassium 4.7; Sodium 138  Recent Lipid Panel    Component Value Date/Time   CHOL 174 12/10/2016 0933   TRIG 90 12/10/2016 0933   HDL 81 12/10/2016 0933   CHOLHDL 2.1 12/10/2016 0933   CHOLHDL 2 06/29/2013 1024   VLDL 10.2 06/29/2013 1024   LDLCALC 75 12/10/2016 0933    Physical Exam:    VS:  There were no vitals taken for this visit.    Wt Readings from Last 3 Encounters:  01/18/20 184 lb (83.5 kg)  01/13/20 184 lb (83.5 kg)  01/12/20 180 lb (81.6 kg)     GEN: *** Well nourished, well developed in no acute distress HEENT: Normal NECK: No JVD; No carotid bruits LYMPHATICS: No lymphadenopathy CARDIAC: ***RRR, no murmurs, rubs, gallops RESPIRATORY:  Clear to auscultation without rales, wheezing or rhonchi  ABDOMEN: Soft, non-tender, non-distended MUSCULOSKELETAL:  No edema; No deformity  SKIN: Warm and dry NEUROLOGIC:  Alert and oriented x 3 PSYCHIATRIC:  Normal affect   ASSESSMENT:    No diagnosis found. PLAN:    In order of problems listed above:  Palpitations - 14 day non-live ziopatch   Medication Adjustments/Labs and Tests Ordered: Current medicines are reviewed at length with the patient today.  Concerns regarding medicines are outlined above.  No orders of the defined types were placed in this encounter.  No orders of the defined types were placed in this  encounter.   There are no Patient Instructions on file for this visit.   Signed, Werner Lean, MD  09/19/2020 8:25 AM     [facility]

## 2020-10-04 ENCOUNTER — Other Ambulatory Visit: Payer: Self-pay

## 2020-10-04 ENCOUNTER — Encounter: Payer: Self-pay | Admitting: Internal Medicine

## 2020-10-04 ENCOUNTER — Ambulatory Visit: Payer: Medicare Other | Admitting: Internal Medicine

## 2020-10-04 VITALS — BP 142/76 | HR 83 | Ht 65.0 in | Wt 188.0 lb

## 2020-10-04 DIAGNOSIS — E785 Hyperlipidemia, unspecified: Secondary | ICD-10-CM

## 2020-10-04 DIAGNOSIS — R011 Cardiac murmur, unspecified: Secondary | ICD-10-CM | POA: Diagnosis not present

## 2020-10-04 DIAGNOSIS — I10 Essential (primary) hypertension: Secondary | ICD-10-CM | POA: Diagnosis not present

## 2020-10-04 DIAGNOSIS — I739 Peripheral vascular disease, unspecified: Secondary | ICD-10-CM

## 2020-10-04 DIAGNOSIS — R002 Palpitations: Secondary | ICD-10-CM

## 2020-10-04 NOTE — Patient Instructions (Signed)
Medication Instructions:  Your physician recommends that you continue on your current medications as directed. Please refer to the Current Medication list given to you today.  *If you need a refill on your cardiac medications before your next appointment, please call your pharmacy*   Lab Work: Your physician recommends that you return for a FASTING lipid profile in 2-3 months at the next visit  If you have labs (blood work) drawn today and your tests are completely normal, you will receive your results only by: Marland Kitchen MyChart Message (if you have MyChart) OR . A paper copy in the mail If you have any lab test that is abnormal or we need to change your treatment, we will call you to review the results.   Testing/Procedures: Your physician has requested that you have an echocardiogram. Echocardiography is a painless test that uses sound waves to create images of your heart. It provides your doctor with information about the size and shape of your heart and how well your heart's chambers and valves are working. This procedure takes approximately one hour. There are no restrictions for this procedure.  Follow-Up: At [facility], you and your health needs are our priority.  As part of our continuing mission to provide you with exceptional heart care, we have created designated Provider Care Teams.  These Care Teams include your primary Cardiologist (physician) and Advanced Practice Providers (APPs -  Physician Assistants and Nurse Practitioners) who all work together to provide you with the care you need, when you need it.  We recommend signing up for the patient portal called "MyChart".  Sign up information is provided on this After Visit Summary.  MyChart is used to connect with patients for Virtual Visits (Telemedicine).  Patients are able to view lab/test results, encounter notes, upcoming appointments, etc.  Non-urgent messages can be sent to your provider as well.   To learn more about what you  can do with MyChart, go to [email or web address].    Your next appointment:   2-3 months  The format for your next appointment:   In Person  Provider:   Rudean Haskell, MD   Other Instructions None

## 2020-10-04 NOTE — Progress Notes (Signed)
Cardiology Office Note:    Date:  10/04/2020   ID:  Angela Wang, DOB 02-12-1931, MRN [id number]  PCP:  Elisabeth Cara, [place] Cardiologist:  No primary care provider on file.  [facility] Electrophysiologist:  None   CC: palpitations Consulted for the evaluation of palpitations at the [facility] of [place], [place], [place]   History of Present Illness:    Angela Wang is a 86 y.o. female with a hx of HTN, prior cerebral aneurysm 06/21/2007 notes), PAD (seen by podiatry with ABIs ~1.33 both legs in 2019), who presents for palpitations.  For the past two months she has had a fluttering feeling in her heart.  No provoking or relieving factors.  When her heart beats fast, she feels tingles all over her body.  No syncope or syncope.  May worsen with bad dreams.  No change in physical activity.  No chest pain, pressure.  No SOB, DOE.    Patient notes that she has knee pain with ambulation.  No claudication pain presently.  No leg pain with ambulation.  Has chronic LE edema and uses compression stockings. Notes that she does not shave her legs.  Is not sure who she sees for PAD.  Ambulatory BP: not done. Patient has a cuff.  Past Medical History:  Diagnosis Date  . ALLERGIC RHINITIS 06/21/2007   Qualifier: Diagnosis of  By: Wynona Luna   . Allergy    allergic rhinitis  . Anemia    nos  . ANEMIA-NOS 06/21/2007   Qualifier: Diagnosis of  By: Wynona Luna   . ANXIETY 06/21/2007   Qualifier: Diagnosis of  By: Wynona Luna   . Arthritis    osteoarthritis  . Atypical chest pain 09/2008   negative myoview  . BACK PAIN, LUMBAR 03/06/2010   Qualifier: Diagnosis of  By: Wynona Luna   . CEREBRAL ANEURYSM 06/21/2007   Qualifier: History of  By: Wynona Luna   . CEREBROVASCULAR ACCIDENT, HX OF 06/21/2007   Qualifier: Diagnosis of  By: Wynona Luna   . CHEST PAIN, ATYPICAL 09/02/2008   Qualifier: Diagnosis of  By: Wynona Luna   .  Chronic sinusitis   . CONSTIPATION, CHRONIC 09/20/2009   Qualifier: Diagnosis of  By: Olevia Perches MD, Lowella Bandy   . Depression   . DEPRESSION 06/21/2007   Qualifier: Diagnosis of  By: Wynona Luna   . Diverticulosis   . DIVERTICULOSIS-COLON 06/09/2008   Qualifier: Diagnosis of  By: Chester Holstein NP, Nevin Bloodgood    . Fibromyalgia   . FIBROMYALGIA 06/21/2007   Qualifier: Diagnosis of  By: Wynona Luna   . GASTRITIS, HX OF 08/25/2008   Qualifier: Diagnosis of  By: Nelson-Smith CMA (AAMA), Dottie    . GERD 05/03/2008   Qualifier: Diagnosis of  By: Julien Girt CMA, Marliss Czar    . GERD (gastroesophageal reflux disease)   . Headache 03/15/2011  . History of solitary pulmonary nodule    right, stable by CT  . Hyperlipidemia   . HYPERLIPIDEMIA 06/21/2007   Qualifier: Diagnosis of  By: Wynona Luna   . Hypertension   . HYPERTENSION 06/21/2007   Qualifier: Diagnosis of  By: Wynona Luna   . HYPOGLYCEMIA 10/24/2010   Qualifier: Diagnosis of  By: Wynona Luna   . HYPOTHYROIDISM 07/22/2008   Qualifier: Diagnosis of  By: Wynona Luna   . IBS  06/21/2007   Qualifier: Diagnosis of  By: Wynona Luna   . IBS (irritable bowel syndrome)   . INSOMNIA, CHRONIC 07/19/2009   Qualifier: Diagnosis of  By: Wynona Luna   . LEG PAIN, RIGHT 02/01/2009   Qualifier: Diagnosis of  By: Wynona Luna   . Leukopenia    chronic  . LEUKOPENIA, CHRONIC 06/21/2007   Qualifier: Diagnosis of  By: Wynona Luna   . NECK PAIN 02/03/2008   Qualifier: Diagnosis of  By: Wynona Luna   . NUMBNESS 01/29/2008   Qualifier: Diagnosis of  By: Loanne Drilling MD, Jacelyn Pi   . Docia Furl NOS-Unspec 06/21/2007   Centricity Description: DEGENERATIVE JOINT DISEASE, MILD Qualifier: Diagnosis of  By: Wynona Luna  Centricity Description: OSTEOARTHRITIS Qualifier: Diagnosis of  By: Wynona Luna   . PULMONARY NODULE 12/26/2007   Qualifier: Diagnosis of  By: Wynona Luna   . Routine general medical examination at a health  care facility   . SCIATICA, RIGHT 05/06/2008   Qualifier: Diagnosis of  By: Nathaneil Canary, CMA, Sarah    . Stroke ([place])   . THROMBOCYTOPENIA 02/09/2009   Qualifier: Diagnosis of  By: Wynona Luna   . UNSTEADY GAIT 07/28/2009   Qualifier: Diagnosis of  By: Wynona Luna   . VENOUS INSUFFICIENCY 05/03/2008   Qualifier: Diagnosis of  By: Julien Girt CMA, Marliss Czar      Past Surgical History:  Procedure Laterality Date  . ABDOMINAL HYSTERECTOMY    . ANEURYSM COILING    . APPENDECTOMY    . CHOLECYSTECTOMY    . JOINT REPLACEMENT     total right knee replacement  . KNEE SURGERY      Current Medications: Current Meds  Medication Sig  . acetaminophen-codeine (TYLENOL #3) 300-30 MG tablet Take 1 tablet by mouth every 8 (eight) hours as needed for moderate pain.  Marland Kitchen ALPRAZolam (XANAX) 0.5 MG tablet Take 0.5 mg by mouth 3 (three) times daily as needed for sleep or anxiety.  . B Complex Vitamins (B COMPLEX 100 PO) Take 1 tablet by mouth daily.    . Calcium-Magnesium-Vitamin D (CALCIUM 500) 500-250-200 MG-MG-UNIT TABS Take by mouth.  . co-enzyme Q-10 30 MG capsule Take 30 mg by mouth 3 (three) times daily.    . fluticasone (FLONASE) 50 MCG/ACT nasal spray Place 2 sprays into both nostrils as needed.   . folic acid (FOLVITE) 1 MG tablet daily.  Marland Kitchen gabapentin (NEURONTIN) 300 MG capsule Take 300 mg by mouth daily.  . iron polysaccharides (NU-IRON) 150 MG capsule Take by mouth.  . lidocaine (XYLOCAINE) 5 % ointment Apply to affected area as needed  . linaclotide (LINZESS) 72 MCG capsule Take 1 capsule (72 mcg total) by mouth daily before breakfast.  . Olopatadine HCl (PATADAY) 0.2 % SOLN Apply 1 drop to eye daily.  Marland Kitchen omeprazole (PRILOSEC) 40 MG capsule Take 1 capsule (40 mg total) by mouth every morning. Take 30-60 minutes before breakfast.  . promethazine-dextromethorphan (PROMETHAZINE-DM) 6.25-15 MG/5ML syrup Take by mouth.  . traMADol (ULTRAM) 50 MG tablet Take 50 mg by mouth as needed.  . triamcinolone  cream (KENALOG) 0.1 % MIX WITH CETAPHIL MOISTURIZING CREAM. APPLY THREE TIMES DAILY AS NEEDED  . vitamin C (ASCORBIC ACID) 500 MG tablet Take 500 mg by mouth.     Allergies:   Cyclobenzaprine, Amitriptyline hcl, Aspirin, Citalopram, Escitalopram oxalate, Escitalopram oxalate, Furosemide, Gabapentin, Hydrocodone-acetaminophen, Lorazepam, Oxycodone, Oxycodone-acetaminophen, Terfenadine, Tramadol, Codeine,  Lactulose, Latex, Levofloxacin, Moxifloxacin, Pantoprazole, Polyethylene glycol 3350, and Rabeprazole   Social History   Socioeconomic History  . Marital status: Widowed    Spouse name: Not on file  . Number of children: 4  . Years of education: Not on file  . Highest education level: Not on file  Occupational History  . 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 Smoking status: Never Smoker  . Smokeless tobacco: Never Used  Substance and Sexual Activity  . Alcohol use: No  . Drug use: No  . Sexual activity: Not on file  Other Topics Concern  . Not on file  Social History Narrative  . Not on file   Social Determinants of Health   Financial Resource Strain:   . Difficulty of Paying Living Expenses: Not on file  Food Insecurity:   . Worried About Charity fundraiser in the Last Year: Not on file  . Ran Out of Food in the Last Year: Not on file  Transportation Needs:   . Lack of Transportation (Medical): Not on file  . Lack of Transportation (Non-Medical): Not on file  Physical Activity:   . Days of Exercise per Week: Not on file  . Minutes of Exercise per Session: Not on file  Stress:   . Feeling of Stress : Not on file  Social Connections:   . Frequency of Communication with Friends and Family: Not on file  . Frequency of Social Gatherings with Friends and Family: Not on file  . Attends Religious Services: Not on file  . Active Member of Clubs or Organizations: Not on file  . Attends Archivist Meetings: Not on file   . Marital Status: Not on file    Family History: The patient's family history includes Allergies in her brother; Asthma in her sister; Breast cancer in her sister; Cancer in her brother, maternal aunt, mother, sister, and another family member; Coronary artery disease in her father; Diabetes in an other family member. Son had two heart attacks  ROS:   Please see the history of present illness.    Notes knee pain. All other systems reviewed and are negative.  EKGs/Labs/Other Studies Reviewed:    The following studies were reviewed today:  EKG:  EKG is ordered today.  The ekg ordered today demonstrates SR rate 87, Low voltage (percordal leads)  10/12/2019- Sinus 74 No ST/T changes  Recent Labs: 10/10/2019: BUN 13; Creatinine, Ser 0.86; Hemoglobin 11.0; Platelets 180; Potassium 4.7; Sodium 138  Recent Lipid Panel    Component Value Date/Time   CHOL 174 12/10/2016 0933   TRIG 90 12/10/2016 0933   HDL 81 12/10/2016 0933   CHOLHDL 2.1 12/10/2016 0933   CHOLHDL 2 06/29/2013 1024   VLDL 10.2 06/29/2013 1024   LDLCALC 75 12/10/2016 0933   07/23/2019 Personally Review Biventricular Function no LA enlargement Study Conclusions- Left ventricle: The cavity size was normal. Wall thickness was  normal. Systolic function was normal. The estimated ejection  fraction was in the range of 55% to 60%. Doppler parameters are  consistent with abnormal left ventricular relaxation (grade 1  diastolic dysfunction).  Echocardiography. M-mode, complete 2D,  spectral Doppler, and color Doppler. Height: Height: 170.2cm.  Height: 66.9in. Weight: Weight: 73.1kg. Weight: 160.8lb. Body mass  index: BMI: 25.2kg/m^2. Body surface area: BSA: 1.59m^2. Patient  status: Inpatient. Location: Echo laboratory.   Physical Exam:    VS:  BP (!) 142/76   Pulse  83   Ht 5\' 5"  (1.651 m)   Wt 188 lb (85.3 kg)   SpO2 98%   BMI 31.28 kg/m     Wt Readings from Last 3 Encounters:  10/04/20 188 lb (85.3  kg)  01/18/20 184 lb (83.5 kg)  01/13/20 184 lb (83.5 kg)    GEN: Well nourished, well developed in no acute distress HEENT: Normal NECK: No JVD; No carotid bruits LYMPHATICS: No lymphadenopathy CARDIAC: RRR, II/VI systolic crescendo, rubs, gallops RESPIRATORY:  Clear to auscultation without rales, wheezing or rhonchi  ABDOMEN: Soft, non-tender, non-distended MUSCULOSKELETAL:  +1 edema; No deformity  SKIN: Warm and dry, skins is slightly mottled without hair bilaterally, small dark spot without ulceration; palpable DP Pulses NEUROLOGIC:  Alert and oriented x 3 PSYCHIATRIC:  Normal affect   ASSESSMENT:    1. Palpitations   2. Heart murmur, systolic   3. Essential hypertension   4. Hyperlipidemia, unspecified hyperlipidemia type   5. PAD (peripheral artery disease) (HCC)    PLAN:    In order of problems listed above:  Palpitations - denies issues in the past 30 days, will defer monitor presently unless return of sx (14 day)  New systolic heart murmur - will echocardiogram  Essential Hypertension - ambulatory blood pressure , will start ambulatory BP monitoring (gave education) - continue home medications  - discussed diet (DASH/low sodium), and exercise/weight loss interventions   PAD Hyperlipidemia Prior aneurysmal stroke -will check lipids at next visit (fasting) - would meet indications for at least plavix therapy - gave education on dietary changes - will repeat ABIs at next visit (gave education to look for claudication pain or new ulcers) - continue compression stockings  2-3 month follow up unless new symptoms or abnormal test results warranting change in plan  Would be reasonable for Virtual Follow up Would be reasonable for APP Follow up   Shared Decision Making/Informed Consent      Medication Adjustments/Labs and Tests Ordered: Current medicines are reviewed at length with the patient today.  Concerns regarding medicines are outlined above.  No  orders of the defined types were placed in this encounter.  No orders of the defined types were placed in this encounter.   There are no Patient Instructions on file for this visit.   Signed, Werner Lean, MD  10/04/2020 10:30 AM    [place]

## 2020-10-28 ENCOUNTER — Other Ambulatory Visit (HOSPITAL_COMMUNITY): Payer: Medicare Other

## 2020-11-01 ENCOUNTER — Ambulatory Visit (HOSPITAL_COMMUNITY): Payer: Medicare Other | Attending: Internal Medicine

## 2020-11-01 ENCOUNTER — Other Ambulatory Visit: Payer: Self-pay

## 2020-11-01 DIAGNOSIS — R011 Cardiac murmur, unspecified: Secondary | ICD-10-CM | POA: Insufficient documentation

## 2020-11-01 LAB — ECHOCARDIOGRAM COMPLETE
Area-P 1/2: 3.99 cm2
S' Lateral: 2.3 cm

## 2020-11-25 ENCOUNTER — Emergency Department (HOSPITAL_BASED_OUTPATIENT_CLINIC_OR_DEPARTMENT_OTHER): Payer: Medicare Other

## 2020-11-25 ENCOUNTER — Emergency Department (HOSPITAL_BASED_OUTPATIENT_CLINIC_OR_DEPARTMENT_OTHER)
Admission: EM | Admit: 2020-11-25 | Discharge: 2020-11-25 | Disposition: A | Discharge status: Home / Self Care | Payer: Medicare Other | Attending: Emergency Medicine | Admitting: Emergency Medicine

## 2020-11-25 ENCOUNTER — Other Ambulatory Visit: Payer: Self-pay

## 2020-11-25 ENCOUNTER — Encounter (HOSPITAL_BASED_OUTPATIENT_CLINIC_OR_DEPARTMENT_OTHER): Payer: Self-pay | Admitting: *Deleted

## 2020-11-25 DIAGNOSIS — Z79899 Other long term (current) drug therapy: Secondary | ICD-10-CM | POA: Diagnosis not present

## 2020-11-25 DIAGNOSIS — Z91040 Latex allergy status: Secondary | ICD-10-CM | POA: Insufficient documentation

## 2020-11-25 DIAGNOSIS — S0990XA Unspecified injury of head, initial encounter: Secondary | ICD-10-CM | POA: Insufficient documentation

## 2020-11-25 DIAGNOSIS — E039 Hypothyroidism, unspecified: Secondary | ICD-10-CM | POA: Diagnosis not present

## 2020-11-25 DIAGNOSIS — Z8669 Personal history of other diseases of the nervous system and sense organs: Secondary | ICD-10-CM | POA: Insufficient documentation

## 2020-11-25 DIAGNOSIS — Z96651 Presence of right artificial knee joint: Secondary | ICD-10-CM | POA: Diagnosis not present

## 2020-11-25 DIAGNOSIS — M25561 Pain in right knee: Secondary | ICD-10-CM | POA: Insufficient documentation

## 2020-11-25 DIAGNOSIS — M549 Dorsalgia, unspecified: Secondary | ICD-10-CM | POA: Diagnosis not present

## 2020-11-25 DIAGNOSIS — M25512 Pain in left shoulder: Secondary | ICD-10-CM | POA: Diagnosis not present

## 2020-11-25 DIAGNOSIS — M25511 Pain in right shoulder: Secondary | ICD-10-CM

## 2020-11-25 DIAGNOSIS — W01198A Fall on same level from slipping, tripping and stumbling with subsequent striking against other object, initial encounter: Secondary | ICD-10-CM | POA: Diagnosis not present

## 2020-11-25 DIAGNOSIS — M25462 Effusion, left knee: Secondary | ICD-10-CM

## 2020-11-25 DIAGNOSIS — I10 Essential (primary) hypertension: Secondary | ICD-10-CM | POA: Insufficient documentation

## 2020-11-25 DIAGNOSIS — W19XXXA Unspecified fall, initial encounter: Secondary | ICD-10-CM

## 2020-11-25 MED ORDER — KETOROLAC TROMETHAMINE 15 MG/ML IJ SOLN
15.0000 mg | Freq: Once | INTRAMUSCULAR | Status: AC
  Administered 2020-11-25: 15 mg via INTRAMUSCULAR
  Filled 2020-11-25: qty 1

## 2020-11-25 MED ORDER — TRAMADOL HCL 50 MG PO TABS: 50.0000 mg | ORAL_TABLET | Freq: Four times a day (QID) | ORAL | 0 refills | Status: DC | PRN

## 2020-11-25 NOTE — ED Triage Notes (Signed)
Pt reports trip and fall on 12/23, tripped on floor and fell backwards, called 911 but declined transport. Has had back pain, bilateral knee pain, right lateral neck and arm pains, "my whole body just hurts". Denies any fevers, cough or any other c/o.

## 2020-11-25 NOTE — Discharge Instructions (Signed)
Your imaging did not show any evidence of broken bones or dislocated joints.  Your head CT as well as her neck CT were reassuring without any significant findings.  You did have some swelling to your left knee.  We have given you a shot for this.  I have written you for a short course of pain medicine.  As we discussed in the room follow-up with orthopedics if your symptoms are unresolved.

## 2020-11-25 NOTE — ED Notes (Signed)
Patient taken to Xray/CT.

## 2020-11-25 NOTE — ED Notes (Signed)
ED Provider at bedside. 

## 2020-11-25 NOTE — ED Provider Notes (Signed)
[facility] EMERGENCY DEPARTMENT Provider Note   CSN: [id number] Arrival date & time: 11/25/20  1200    History Chief Complaint  Patient presents with  . Fall    Janeece AgeeBetty I Monrroy is a [age] y.o. female with past medical history significant for chronic pain, chronic constipation, fibromyalgia, hypertension, hyperlipidemia who presents for evaluation after mechanical fall.  Patient states approximately 8 days ago she tripped and fell while going from tile to carpet.  Patient denies any preceding symptoms of thunderclap headache, paresthesias, weakness, chest pain, shortness of breath.  Patient states she fell backwards.  She admits to hitting the posterior aspect of her head on tile floor.  States she was able to get up with help of her family.  Her daughter recently moved in with her to help her.  Had called 911 who assessed her however declined transport.  Patient states she has had "pain all over" since the incident.  Has not taken anything for her symptoms.  Has been ambulatory with her walker which he uses at baseline.  She denies headache, lightheadedness, dizziness, chest pain, shortness of breath abdominal pain, diarrhea, dysuria, paresthesias, weakness.  States she does follow with an orthopedic doctor for her bilateral, chronic knee pain.  She recently had injections to her left knee approximately 3 weeks ago.  She does not member the orthopedist name.  She denies any fever, chills, nausea, vomiting, cough, congestion, rhinorrhea.  No one has been sick around her.  Denies additional aggravating or alleviating factors.  Patient is wondering if she can get a shot of "something like Penicillin or prednisone" to help with her pain  History obtained from patient and past medical records.  No interpreter used.  HPI     Past Medical History:  Diagnosis Date  . ALLERGIC RHINITIS 06/21/2007   Qualifier: Diagnosis of  By: Nena JordanYoo DO, D. Robert   . Allergy    allergic rhinitis  . Anemia     nos  . ANEMIA-NOS 06/21/2007   Qualifier: Diagnosis of  By: Nena JordanYoo DO, D. Robert   . ANXIETY 06/21/2007   Qualifier: Diagnosis of  By: Nena JordanYoo DO, D. Robert   . Arthritis    osteoarthritis  . Atypical chest pain 09/2008   negative myoview  . BACK PAIN, LUMBAR 03/06/2010   Qualifier: Diagnosis of  By: Nena JordanYoo DO, D. Robert   . CEREBRAL ANEURYSM 06/21/2007   Qualifier: History of  By: Nena JordanYoo DO, D. Robert   . CEREBROVASCULAR ACCIDENT, HX OF 06/21/2007   Qualifier: Diagnosis of  By: Nena JordanYoo DO, D. Robert   . CHEST PAIN, ATYPICAL 09/02/2008   Qualifier: Diagnosis of  By: Nena JordanYoo DO, D. Robert   . Chronic sinusitis   . CONSTIPATION, CHRONIC 09/20/2009   Qualifier: Diagnosis of  By: Juanda ChanceBrodie MD, Hedwig Mortonora M   . Depression   . DEPRESSION 06/21/2007   Qualifier: Diagnosis of  By: Nena JordanYoo DO, D. Robert   . Diverticulosis   . DIVERTICULOSIS-COLON 06/09/2008   Qualifier: Diagnosis of  By: Wilmon PaliGuenther NP, Gunnar FusiPaula    . Fibromyalgia   . FIBROMYALGIA 06/21/2007   Qualifier: Diagnosis of  By: Nena JordanYoo DO, D. Robert   . GASTRITIS, HX OF 08/25/2008   Qualifier: Diagnosis of  By: Nelson-Smith CMA (AAMA), Dottie    . GERD 05/03/2008   Qualifier: Diagnosis of  By: Renaldo FiddlerAdkins CMA, Marliss CzarLeigh    . GERD (gastroesophageal reflux disease)   . Headache 03/15/2011  . History of solitary pulmonary nodule    right, stable  by CT  . Hyperlipidemia   . HYPERLIPIDEMIA 06/21/2007   Qualifier: Diagnosis of  By: Wynona Luna   . Hypertension   . HYPERTENSION 06/21/2007   Qualifier: Diagnosis of  By: Wynona Luna   . HYPOGLYCEMIA 10/24/2010   Qualifier: Diagnosis of  By: Wynona Luna   . HYPOTHYROIDISM 07/22/2008   Qualifier: Diagnosis of  By: Wynona Luna   . IBS 06/21/2007   Qualifier: Diagnosis of  By: Wynona Luna   . IBS (irritable bowel syndrome)   . INSOMNIA, CHRONIC 07/19/2009   Qualifier: Diagnosis of  By: Wynona Luna   . LEG PAIN, RIGHT 02/01/2009   Qualifier: Diagnosis of  By: Wynona Luna   . Leukopenia    chronic  .  LEUKOPENIA, CHRONIC 06/21/2007   Qualifier: Diagnosis of  By: Wynona Luna   . NECK PAIN 02/03/2008   Qualifier: Diagnosis of  By: Wynona Luna   . NUMBNESS 01/29/2008   Qualifier: Diagnosis of  By: Loanne Drilling MD, Jacelyn Pi   . Docia Furl NOS-Unspec 06/21/2007   Centricity Description: DEGENERATIVE JOINT DISEASE, MILD Qualifier: Diagnosis of  By: Wynona Luna  Centricity Description: OSTEOARTHRITIS Qualifier: Diagnosis of  By: Wynona Luna   . PULMONARY NODULE 12/26/2007   Qualifier: Diagnosis of  By: Wynona Luna   . Routine general medical examination at a health care facility   . SCIATICA, RIGHT 05/06/2008   Qualifier: Diagnosis of  By: Nathaneil Canary, CMA, Sarah    . Stroke ([place])   . THROMBOCYTOPENIA 02/09/2009   Qualifier: Diagnosis of  By: Wynona Luna   . UNSTEADY GAIT 07/28/2009   Qualifier: Diagnosis of  By: Wynona Luna   . VENOUS INSUFFICIENCY 05/03/2008   Qualifier: Diagnosis of  By: Julien Girt CMA, Leigh      Patient Active Problem List   Diagnosis Date Noted  . Palpitations 10/04/2020  . Heart murmur, systolic [id number]  . Essential hypertension 10/04/2020  . PAD (peripheral artery disease) ([place]) 05/09/2016  . Seasonal allergic rhinitis due to pollen 05/09/2016  . Benign neoplasm of left retina 01/10/2016  . Pseudophakia of both eyes 01/10/2016  . Pseudophakia 01/10/2016  . Retinal edema 01/10/2016  . Decreased mobility and endurance 11/22/2015  . Pain due to knee joint prosthesis ([place]) 10/02/2013  . Cerebral embolism with cerebral infarction ([place]) 07/10/2013  . Bilateral posterior capsular opacification 05/19/2012  . Branch retinal vein occlusion 02/08/2012  . Cystoid macular edema 02/08/2012  . Macular degeneration 02/08/2012  . Headache(784.0) 03/15/2011  . HYPOGLYCEMIA 10/24/2010  . BACK PAIN, LUMBAR 03/06/2010  . CONSTIPATION, CHRONIC 09/20/2009  . SINUSITIS, CHRONIC 08/16/2009  . UNSTEADY GAIT 07/28/2009  . INSOMNIA, CHRONIC 07/19/2009  .  THROMBOCYTOPENIA 02/09/2009  . LEG PAIN, RIGHT 02/01/2009  . Other abnormal glucose 02/01/2009  . CHEST PAIN, ATYPICAL 09/02/2008  . GASTRITIS, HX OF 08/25/2008  . HYPOTHYROIDISM 07/22/2008  . DIVERTICULOSIS-COLON 06/09/2008  . Sciatica of left side 05/06/2008  . VENOUS INSUFFICIENCY 05/03/2008  . GERD 05/03/2008  . NECK PAIN 02/03/2008  . NUMBNESS 01/29/2008  . PULMONARY NODULE 12/26/2007  . Hyperlipidemia 06/21/2007  . ANEMIA-NOS 06/21/2007  . LEUKOPENIA, CHRONIC 06/21/2007  . ANXIETY 06/21/2007  . DEPRESSION 06/21/2007  . HYPERTENSION 06/21/2007  . CEREBRAL ANEURYSM 06/21/2007  . ALLERGIC RHINITIS 06/21/2007  . IBS 06/21/2007  . Osteoarthrosis, unspecified whether generalized or localized, unspecified site 06/21/2007  . FIBROMYALGIA  06/21/2007  . CEREBROVASCULAR ACCIDENT, HX OF 06/21/2007    Past Surgical History:  Procedure Laterality Date  . ABDOMINAL HYSTERECTOMY    . ANEURYSM COILING    . APPENDECTOMY    . CHOLECYSTECTOMY    . JOINT REPLACEMENT     total right knee replacement  . KNEE SURGERY       OB History   No obstetric history on file.     Family History  Problem Relation Age of Onset  . Cancer Mother        pancreatic  . Coronary artery disease Father   . Cancer Sister         breast  . Asthma Sister   . Breast cancer Sister   . Cancer Brother        prostate  . Allergies Brother   . Cancer Maternal Aunt        colon  . Diabetes Other   . Cancer Other        ovarian    Social History   Tobacco Use  . Smoking status: Never Smoker  . Smokeless tobacco: Never Used  Substance Use Topics  . Alcohol use: No  . Drug use: No    Home Medications Prior to Admission medications   Medication Sig Start Date End Date Taking? Authorizing Provider  traMADol (ULTRAM) 50 MG tablet Take 1 tablet (50 mg total) by mouth every 6 (six) hours as needed. 11/25/20  Yes Everette Dimauro A, PA-C  acetaminophen-codeine (TYLENOL #3) 300-30 MG tablet Take 1  tablet by mouth every 8 (eight) hours as needed for moderate pain. 01/18/20   Nadara Mustard, MD  ALPRAZolam Prudy Feeler) 0.5 MG tablet Take 0.5 mg by mouth 3 (three) times daily as needed for sleep or anxiety.    [provider]  B Complex Vitamins (B COMPLEX 100 PO) Take 1 tablet by mouth daily.      [provider]  Calcium-Magnesium-Vitamin D (CALCIUM 500) 500-250-200 MG-MG-UNIT TABS Take by mouth.    [provider]  co-enzyme Q-10 30 MG capsule Take 30 mg by mouth 3 (three) times daily.      [provider]  fluticasone (FLONASE) 50 MCG/ACT nasal spray Place 2 sprays into both nostrils as needed.     [provider]  folic acid (FOLVITE) 1 MG tablet daily. 01/25/20   [provider]  gabapentin (NEURONTIN) 300 MG capsule Take 300 mg by mouth daily. 09/26/20   [provider]  iron polysaccharides (NU-IRON) 150 MG capsule Take by mouth. 02/11/18   [provider]  lidocaine (XYLOCAINE) 5 % ointment Apply to affected area as needed 03/04/18   [provider]  linaclotide (LINZESS) 72 MCG capsule Take 1 capsule (72 mcg total) by mouth daily before breakfast. 09/07/19   Unk Lightning, PA  Olopatadine HCl (PATADAY) 0.2 % SOLN Apply 1 drop to eye daily.    [provider]  omeprazole (PRILOSEC) 40 MG capsule Take 1 capsule (40 mg total) by mouth every morning. Take 30-60 minutes before breakfast. 01/13/20   Hilarie Fredrickson, MD  promethazine-dextromethorphan (PROMETHAZINE-DM) 6.25-15 MG/5ML syrup Take by mouth. 12/18/18   [provider]  triamcinolone cream (KENALOG) 0.1 % MIX WITH CETAPHIL MOISTURIZING CREAM. APPLY THREE TIMES DAILY AS NEEDED 10/30/16   [provider]  vitamin C (ASCORBIC ACID) 500 MG tablet Take 500 mg by mouth.    [provider]    Allergies    Cyclobenzaprine, Amitriptyline hcl, Aspirin,  Citalopram, Escitalopram oxalate, Escitalopram oxalate, Furosemide, Gabapentin,  Hydrocodone-acetaminophen, Lorazepam, Oxycodone, Oxycodone-acetaminophen, Terfenadine, Tramadol, Codeine, Lactulose, Latex, Levofloxacin, Moxifloxacin, Pantoprazole, Polyethylene glycol 3350, and Rabeprazole  Review of Systems   Review of Systems  Constitutional: Negative.   HENT: Negative.   Respiratory: Negative.   Cardiovascular: Negative.   Gastrointestinal: Negative.   Genitourinary: Negative.   Musculoskeletal: Positive for back pain.       Right shoulder, bilateral knee pain  Skin: Negative.   Neurological: Negative.   All other systems reviewed and are negative.   Physical Exam Updated Vital Signs BP (!) 157/89 (BP Location: Right Arm)   Pulse 70   Temp 98.4 F (36.9 C) (Oral)   Resp 18   Ht 5\' 6"  (1.676 m)   Wt 80.3 kg   SpO2 98%   BMI 28.57 kg/m   Physical Exam Vitals and nursing note reviewed.  Constitutional:      General: She is not in acute distress.    Appearance: She is well-developed and well-nourished. She is not ill-appearing, toxic-appearing or diaphoretic.  HENT:     Head: Normocephalic and atraumatic. No raccoon eyes, Battle's sign, abrasion, contusion, masses, right periorbital erythema, left periorbital erythema or laceration.     Jaw: There is normal jaw occlusion.     Right Ear: No hemotympanum.     Left Ear: No hemotympanum.     Nose: Nose normal.     Right Sinus: No maxillary sinus tenderness or frontal sinus tenderness.     Left Sinus: No maxillary sinus tenderness or frontal sinus tenderness.     Comments: No septal hematoma    Mouth/Throat:     Lips: Pink.     Mouth: Mucous membranes are moist.     Pharynx: Oropharynx is clear. Uvula midline.     Comments: No drooling, dysphagia or trismus.  Tongue midline.  Smile symmetric. Eyes:     Extraocular Movements: Extraocular movements intact.     Conjunctiva/sclera: Conjunctivae normal.     Pupils: Pupils are equal, round, and reactive to light.     Comments: PERRLA  Neck:     Trachea:  Trachea and phonation normal.      Comments: Tenderness to right cervical paraspinal muscles.  Full range of motion without difficulty.  Able to place chin to chest.  No rigidity.  No meningismus. Cardiovascular:     Rate and Rhythm: Normal rate.     Pulses: Normal pulses and intact distal pulses.          Dorsalis pedis pulses are 2+ on the right side and 2+ on the left side.       Posterior tibial pulses are 2+ on the right side and 2+ on the left side.     Heart sounds: Normal heart sounds.  Pulmonary:     Effort: Pulmonary effort is normal. No respiratory distress.     Breath sounds: Normal breath sounds and air entry.     Comments: Speaks in full sentences without difficulty.  Clear to auscultation bilateral without wheeze, rhonchi or rales. Chest:     Comments: Equal rise and fall to chest wall.  No bony tenderness to anterior, posterior ribs.  No traumatic injuries. Abdominal:     General: Bowel sounds are normal. There is no distension.     Palpations: Abdomen is soft.     Tenderness: There is no abdominal tenderness. There is no right CVA tenderness, left CVA tenderness, guarding or rebound. Negative signs include Murphy's sign and McBurney's sign.  Hernia: No hernia is present.     Comments: Soft, nontender without rebound or guarding.  No overlying skin changes.  Musculoskeletal:        General: Normal range of motion.     Cervical back: Full passive range of motion without pain and normal range of motion.     Comments: Diffuse right cervical and diffuse bilateral paraspinal tenderness to lumbar region.  No midline thoracic tenderness.  No step-off.  Diffuse tenderness to right shoulder.  Able to raise bilateral arms overhead.  Negative Hawkins, empty can.  No bony tenderness to humerus, forearm or hands.  Pelvis stable, nontender palpation.  No shortening or rotation of legs.  No bony tenderness to femur, tib-fib or feet.  She has generalized tenderness to bilateral anterior  knees.  Negative Michel Bickers cane.  Moderate left knee effusion.  Lymphadenopathy:     Cervical: No cervical adenopathy.  Skin:    General: Skin is warm and dry.     Capillary Refill: Capillary refill takes less than 2 seconds.     Comments: Trace bilateral non pitting edema to bilateral lower extremities   Neurological:     Mental Status: She is alert.     Comments: CN 2-12 grossly intact  Psychiatric:        Mood and Affect: Mood and affect normal.     ED Results / Procedures / Treatments   Labs (all labs ordered are listed, but only abnormal results are displayed) Labs Reviewed - No data to display  EKG None  Radiology DG Lumbar Spine Complete  Result Date: 11/25/2020 CLINICAL DATA:  [place].  Back pain. EXAM: LUMBAR SPINE - COMPLETE 4+ VIEW COMPARISON:  09/22/2018 FINDINGS: Stable degenerative retrolisthesis of L5 and stable moderate lower lumbar facet disease. No acute fracture. Stable degenerative disc disease at L4-5 and L5-S1. The bony pelvis is intact. No pelvic fractures or obvious bone lesions. IMPRESSION: 1. Stable degenerative lumbar spondylosis with disc disease and facet disease. 2. No acute bony findings. Electronically Signed   By: Marijo Sanes M.D.   On: 11/25/2020 16:11   DG Pelvis 1-2 Views  Result Date: 11/25/2020 CLINICAL DATA:  [place] on 11/17/2020. Bilateral knee pain. Low back and pelvic pain. EXAM: PELVIS - 1-2 VIEW COMPARISON:  None. FINDINGS: Degenerative changes in the lower lumbar spine and hips. No evidence of acute fracture or dislocation of the pelvis. SI joints and symphysis pubis are not displaced. No focal bone lesions. Soft tissue calcifications, likely injection granulomas. IMPRESSION: No acute fracture or dislocation. Electronically Signed   By: Lucienne Capers M.D.   On: 11/25/2020 16:10   CT Head Wo Contrast  Result Date: 11/25/2020 CLINICAL DATA:  [place] 11/17/2020, right lateral neck pain EXAM: CT HEAD WITHOUT CONTRAST TECHNIQUE:  Contiguous axial images were obtained from the base of the skull through the vertex without intravenous contrast. COMPARISON:  10/10/2019 FINDINGS: Brain: No acute infarct or hemorrhage. The lateral ventricles and midline structures are stable. Cavum septum pellucidum again noted. No acute extra-axial fluid collections. No mass effect. Vascular: Aneurysm coil in the right MCA territory unchanged. No hyperdense vessel. Skull: Normal. Negative for fracture or focal lesion. Sinuses/Orbits: Chronic opacification of the right maxillary sinus and posterior right ethmoid air cells. Other: None. IMPRESSION: 1. Stable head CT, no acute process. Electronically Signed   By: Randa Ngo M.D.   On: 11/25/2020 15:45   CT Cervical Spine Wo Contrast  Result Date: 11/25/2020 CLINICAL DATA:  [place] 11/17/2020, right lateral neck pain  EXAM: CT CERVICAL SPINE WITHOUT CONTRAST TECHNIQUE: Multidetector CT imaging of the cervical spine was performed without intravenous contrast. Multiplanar CT image reconstructions were also generated. COMPARISON:  09/22/2018 FINDINGS: Alignment: Alignment is anatomic. Skull base and vertebrae: No acute fracture. No primary bone lesion or focal pathologic process. Soft tissues and spinal canal: No prevertebral fluid or swelling. No visible canal hematoma. Disc levels: Mild diffuse cervical spondylosis greatest at the C3-4 and C6-7 levels. Mild diffuse facet hypertrophy. No significant bony encroachment upon the central canal or neural foramina. Upper chest: Airway is patent.  Lung apices are clear. Other: Reconstructed images demonstrate no additional findings. IMPRESSION: 1. Mild diffuse cervical spondylosis and facet hypertrophy. 2. No acute cervical spine fracture. Electronically Signed   By: Randa Ngo M.D.   On: 11/25/2020 15:47   DG Knee Complete 4 Views Left  Result Date: 11/25/2020 CLINICAL DATA:  [place] 11/17/2020, bilateral knee pain EXAM: RIGHT KNEE - COMPLETE 4+ VIEW; LEFT KNEE -  COMPLETE 4+ VIEW COMPARISON:  None. FINDINGS: Left knee: Frontal, bilateral oblique, lateral views of the left knee are obtained. There is moderate to severe 3 compartmental osteoarthritis greatest in the medial and lateral compartments. No acute displaced fractures. Large joint effusion. Soft tissues are unremarkable. Right knee: Frontal, bilateral oblique, lateral views of the right knee are obtained. Three component right knee arthroplasty is identified in the expected position without evidence of complication. There are no acute or destructive bony lesions. Small joint effusion. Soft tissues are unremarkable. IMPRESSION: Left knee: 1. Moderate to severe 3 compartmental osteoarthritis. 2. No acute fracture. 3. Large joint effusion. Right knee: 1. Unremarkable right knee arthroplasty. 2. No acute fracture. 3. Small joint effusion. Electronically Signed   By: Randa Ngo M.D.   On: 11/25/2020 16:11   DG Knee Complete 4 Views Right  Result Date: 11/25/2020 CLINICAL DATA:  [place] 11/17/2020, bilateral knee pain EXAM: RIGHT KNEE - COMPLETE 4+ VIEW; LEFT KNEE - COMPLETE 4+ VIEW COMPARISON:  None. FINDINGS: Left knee: Frontal, bilateral oblique, lateral views of the left knee are obtained. There is moderate to severe 3 compartmental osteoarthritis greatest in the medial and lateral compartments. No acute displaced fractures. Large joint effusion. Soft tissues are unremarkable. Right knee: Frontal, bilateral oblique, lateral views of the right knee are obtained. Three component right knee arthroplasty is identified in the expected position without evidence of complication. There are no acute or destructive bony lesions. Small joint effusion. Soft tissues are unremarkable. IMPRESSION: Left knee: 1. Moderate to severe 3 compartmental osteoarthritis. 2. No acute fracture. 3. Large joint effusion. Right knee: 1. Unremarkable right knee arthroplasty. 2. No acute fracture. 3. Small joint effusion. Electronically Signed    By: Randa Ngo M.D.   On: 11/25/2020 16:11    Procedures Procedures (including critical care time)  Medications Ordered in ED Medications  ketorolac (TORADOL) 15 MG/ML injection 15 mg (15 mg Intramuscular Given 11/25/20 1655)    ED Course  I have reviewed the triage vital signs and the nursing notes.  Pertinent labs & imaging results that were available during my care of the patient were reviewed by me and considered in my medical decision making (see chart for details).  86 year old presents for evaluation of mechanical fall 1 week ago.  No preceding symptoms prior to fall.  He has been ambulatory with a cane which she uses at baseline prior to fall.  States fell backwards on the posterior aspect of her head.  Has had some right paraspinal lumbar pain  as well as pain to her bilateral knees, right shoulder.  She has full range of motion of all of her joints.  Does have moderate knee effusion to her left knee.  She is followed by orthopedics for chronic bilateral knee pain.  Her heart and lungs are clear.  Her abdomen is soft, nontender.  She has a nonfocal neuro exam without deficits.  Plan on imaging and reassess  CT head without significant findings CT cervical spine without significant finding Plain film right shoulder without acute fracture, dislocation or effusion Plain from pelvis without fracture dislocation Plain film right knee without fracture, dislocation.  Does have small effusion Plain film left knee without fracture, dislocation.  Does have large joint effusion.  Discussed imaging results with patient in room.  Her pain is been controlled here in the ED.  She is ambulatory with her cane which she is at baseline.  She continues have a nonfocal neuro exam without deficits.  We will have her follow-up with orthopedics who she follows at baseline for her chronic knee pain.  I do not feel she needs arthrocentesis at this time.  I have low suspicion for septic joint, gout,  hemarthrosis, acute fracture, dislocation.  I discussed return precautions with patient.  She is agreeable for this.  She will return for any worsening symptoms.  The patient has been appropriately medically screened and/or stabilized in the ED. I have low suspicion for any other emergent medical condition which would require further screening, evaluation or treatment in the ED or require inpatient management.  Patient is hemodynamically stable and in no acute distress.  Patient able to ambulate in department prior to ED.  Evaluation does not show acute pathology that would require ongoing or additional emergent interventions while in the emergency department or further inpatient treatment.  I have discussed the diagnosis with the patient and answered all questions.  Pain is been managed while in the emergency department and patient has no further complaints prior to discharge.  Patient is comfortable with plan discussed in room and is stable for discharge at this time.  I have discussed strict return precautions for returning to the emergency department.  Patient was encouraged to follow-up with PCP/specialist refer to at discharge.    MDM Rules/Calculators/A&P                           Final Clinical Impression(s) / ED Diagnoses Final diagnoses:  Fall, initial encounter  Acute pain of right shoulder  Acute pain of both knees  Effusion of bursa of left knee    Rx / DC Orders ED Discharge Orders         Ordered    traMADol (ULTRAM) 50 MG tablet  Every 6 hours PRN        11/25/20 1658           Elijahjames Fuelling A, PA-C 11/25/20 1701    Quintella Reichert, MD 11/26/20 [phone]

## 2020-12-27 ENCOUNTER — Ambulatory Visit: Payer: Medicare Other | Admitting: Internal Medicine

## 2020-12-27 ENCOUNTER — Other Ambulatory Visit: Payer: Self-pay

## 2020-12-27 ENCOUNTER — Other Ambulatory Visit: Payer: Medicare Other | Admitting: *Deleted

## 2020-12-27 ENCOUNTER — Encounter: Payer: Self-pay | Admitting: Internal Medicine

## 2020-12-27 VITALS — BP 128/72 | HR 83 | Ht 65.5 in | Wt 190.0 lb

## 2020-12-27 DIAGNOSIS — E785 Hyperlipidemia, unspecified: Secondary | ICD-10-CM

## 2020-12-27 DIAGNOSIS — I739 Peripheral vascular disease, unspecified: Secondary | ICD-10-CM

## 2020-12-27 DIAGNOSIS — I10 Essential (primary) hypertension: Secondary | ICD-10-CM | POA: Diagnosis not present

## 2020-12-27 DIAGNOSIS — I371 Nonrheumatic pulmonary valve insufficiency: Secondary | ICD-10-CM | POA: Diagnosis not present

## 2020-12-27 LAB — LIPID PANEL
Chol/HDL Ratio: 2.5 ratio (ref 0.0–4.4)
Cholesterol, Total: 169 mg/dL (ref 100–199)
HDL: 68 mg/dL (ref >39)
LDL Chol Calc (NIH): 84 mg/dL (ref 0–99)
Triglycerides: 95 mg/dL (ref 0–149)
VLDL Cholesterol Cal: 17 mg/dL (ref 5–40)

## 2020-12-27 NOTE — Patient Instructions (Signed)
Medication Instructions:  Your physician recommends that you continue on your current medications as directed. Please refer to the Current Medication list given to you today.  *If you need a refill on your cardiac medications before your next appointment, please call your pharmacy*   Lab Work: TODAY: Lipid Panel  If you have labs (blood work) drawn today and your tests are completely normal, you will receive your results only by: Marland Kitchen MyChart Message (if you have MyChart) OR . A paper copy in the mail If you have any lab test that is abnormal or we need to change your treatment, we will call you to review the results.   Testing/Procedures: Your physician has requested that you have an echocardiogram. Echocardiography is a painless test that uses sound waves to create images of your heart. It provides your doctor with information about the size and shape of your heart and how well your heart's chambers and valves are working. This procedure takes approximately one hour. There are no restrictions for this procedure. TO BE DONE IN 1 YEAR.   Your physician has requested that you have an ankle brachial index (ABI). During this test an ultrasound and blood pressure cuff are used to evaluate the arteries that supply the arms and legs with blood. Allow thirty minutes for this exam. There are no restrictions or special instructions.  Your physician has requested that you have a lower  arterial duplex. This test is an ultrasound of the arteries in the legs. It looks at arterial blood flow in the legs. Allow one hour for Lower Arterial scans. There are no restrictions or special instructions    Follow-Up: At [facility], you and your health needs are our priority.  As part of our continuing mission to provide you with exceptional heart care, we have created designated Provider Care Teams.  These Care Teams include your primary Cardiologist (physician) and Advanced Practice Providers (APPs -  Physician  Assistants and Nurse Practitioners) who all work together to provide you with the care you need, when you need it.  We recommend signing up for the patient portal called "MyChart".  Sign up information is provided on this After Visit Summary.  MyChart is used to connect with patients for Virtual Visits (Telemedicine).  Patients are able to view lab/test results, encounter notes, upcoming appointments, etc.  Non-urgent messages can be sent to your provider as well.   To learn more about what you can do with MyChart, go to [email or web address].    Your next appointment:   6 month(s)  The format for your next appointment:   In Person  Provider:   You may see Gasper Sells, MD or one of the following Advanced Practice Providers on your designated Care Team:    Melina Copa, PA-C  Ermalinda Barrios, PA-C

## 2020-12-27 NOTE — Progress Notes (Signed)
Cardiology Office Note:    Date:  12/27/2020   ID:  Angela Wang, DOB 06/22/1931, MRN [id number]  PCP:  Elisabeth Cara, PA-C  [facility] Cardiologist:  Rudean Haskell MD [place] Electrophysiologist:  None   CC: Follow up palpitations, heart murmur  History of Present Illness:    Angela Wang is a 87 y.o. female with a hx of HTN, prior cerebral aneurysm 06/21/2007 notes), PAD (seen by podiatry with ABIs ~1.33 both legs in 2019), who presented for palpitations 10/04/20.  In interval; had moderate pulmonic insufficiency without RV dilation or dysfunction.  Patient notes that she is doing badly.  Everything hurts:  Is going to the orthopedic doctor tomorrow (arms and feel and wrists, finger and hands hurt).  Since last visit notes  changes.  Relevant interval testing or therapy include no new medications.  Had interval visit 11/25/20 for mechanical fall.  No chest pain or pressure .  No SOB/DOE and no PND/Orthopnea.  Notes weight gain but no leg swelling (does notes knee swelling).  No palpitations or syncope   Ambulatory blood pressure 120/80.   Past Medical History:  Diagnosis Date  . ALLERGIC RHINITIS 06/21/2007   Qualifier: Diagnosis of  By: Wynona Luna   . Allergy    allergic rhinitis  . Anemia    nos  . ANEMIA-NOS 06/21/2007   Qualifier: Diagnosis of  By: Wynona Luna   . ANXIETY 06/21/2007   Qualifier: Diagnosis of  By: Wynona Luna   . Arthritis    osteoarthritis  . Atypical chest pain 09/2008   negative myoview  . BACK PAIN, LUMBAR 03/06/2010   Qualifier: Diagnosis of  By: Wynona Luna   . CEREBRAL ANEURYSM 06/21/2007   Qualifier: History of  By: Wynona Luna   . CEREBROVASCULAR ACCIDENT, HX OF 06/21/2007   Qualifier: Diagnosis of  By: Wynona Luna   . CHEST PAIN, ATYPICAL 09/02/2008   Qualifier: Diagnosis of  By: Wynona Luna   . Chronic sinusitis   . CONSTIPATION, CHRONIC 09/20/2009   Qualifier: Diagnosis of   By: Olevia Perches MD, Lowella Bandy   . Depression   . DEPRESSION 06/21/2007   Qualifier: Diagnosis of  By: Wynona Luna   . Diverticulosis   . DIVERTICULOSIS-COLON 06/09/2008   Qualifier: Diagnosis of  By: Chester Holstein NP, Nevin Bloodgood    . Fibromyalgia   . FIBROMYALGIA 06/21/2007   Qualifier: Diagnosis of  By: Wynona Luna   . GASTRITIS, HX OF 08/25/2008   Qualifier: Diagnosis of  By: Nelson-Smith CMA (AAMA), Dottie    . GERD 05/03/2008   Qualifier: Diagnosis of  By: Julien Girt CMA, Marliss Czar    . GERD (gastroesophageal reflux disease)   . Headache 03/15/2011  . History of solitary pulmonary nodule    right, stable by CT  . Hyperlipidemia   . HYPERLIPIDEMIA 06/21/2007   Qualifier: Diagnosis of  By: Wynona Luna   . Hypertension   . HYPERTENSION 06/21/2007   Qualifier: Diagnosis of  By: Wynona Luna   . HYPOGLYCEMIA 10/24/2010   Qualifier: Diagnosis of  By: Wynona Luna   . HYPOTHYROIDISM 07/22/2008   Qualifier: Diagnosis of  By: Wynona Luna   . IBS 06/21/2007   Qualifier: Diagnosis of  By: Wynona Luna   . IBS (irritable bowel syndrome)   . INSOMNIA, CHRONIC 07/19/2009   Qualifier: Diagnosis of  By: Wynona Luna   . LEG PAIN, RIGHT 02/01/2009   Qualifier: Diagnosis of  By: Wynona Luna   . Leukopenia    chronic  . LEUKOPENIA, CHRONIC 06/21/2007   Qualifier: Diagnosis of  By: Wynona Luna   . NECK PAIN 02/03/2008   Qualifier: Diagnosis of  By: Wynona Luna   . NUMBNESS 01/29/2008   Qualifier: Diagnosis of  By: Loanne Drilling MD, Jacelyn Pi   . Docia Furl NOS-Unspec 06/21/2007   Centricity Description: DEGENERATIVE JOINT DISEASE, MILD Qualifier: Diagnosis of  By: Wynona Luna  Centricity Description: OSTEOARTHRITIS Qualifier: Diagnosis of  By: Wynona Luna   . PULMONARY NODULE 12/26/2007   Qualifier: Diagnosis of  By: Wynona Luna   . Routine general medical examination at a health care facility   . SCIATICA, RIGHT 05/06/2008   Qualifier: Diagnosis of  By:  Nathaneil Canary, CMA, Sarah    . Stroke ([place])   . THROMBOCYTOPENIA 02/09/2009   Qualifier: Diagnosis of  By: Wynona Luna   . UNSTEADY GAIT 07/28/2009   Qualifier: Diagnosis of  By: Wynona Luna   . VENOUS INSUFFICIENCY 05/03/2008   Qualifier: Diagnosis of  By: Julien Girt CMA, Marliss Czar      Past Surgical History:  Procedure Laterality Date  . ABDOMINAL HYSTERECTOMY    . ANEURYSM COILING    . APPENDECTOMY    . CHOLECYSTECTOMY    . JOINT REPLACEMENT     total right knee replacement  . KNEE SURGERY      Current Medications: Current Meds  Medication Sig  . acetaminophen-codeine (TYLENOL #3) 300-30 MG tablet Take 1 tablet by mouth every 8 (eight) hours as needed for moderate pain.  Marland Kitchen ALPRAZolam (XANAX) 0.5 MG tablet Take 0.5 mg by mouth 3 (three) times daily as needed for sleep or anxiety.  Marland Kitchen amoxicillin-clavulanate (AUGMENTIN) 875-125 MG tablet Take 1 tablet by mouth daily.  . B Complex Vitamins (B COMPLEX 100 PO) Take 1 tablet by mouth daily.  . Calcium-Magnesium-Vitamin D [id number] MG-MG-UNIT TABS Take 1 tablet by mouth daily.  Marland Kitchen co-enzyme Q-10 30 MG capsule Take 30 mg by mouth 3 (three) times daily.  . fluticasone (FLONASE) 50 MCG/ACT nasal spray Place 2 sprays into both nostrils as needed for allergies or rhinitis.  . folic acid (FOLVITE) 1 MG tablet Take 1 mg by mouth daily.  Marland Kitchen gabapentin (NEURONTIN) 300 MG capsule Take 300 mg by mouth daily.  . iron polysaccharides (NIFEREX) 150 MG capsule Take 150 mg by mouth daily.  Marland Kitchen lidocaine (XYLOCAINE) 5 % ointment Apply 1 application topically as directed.  . linaclotide (LINZESS) 72 MCG capsule Take 1 capsule (72 mcg total) by mouth daily before breakfast.  . Olopatadine HCl 0.2 % SOLN Apply 1 drop to eye daily.  Marland Kitchen omeprazole (PRILOSEC) 40 MG capsule Take 1 capsule (40 mg total) by mouth every morning. Take 30-60 minutes before breakfast.  . promethazine-dextromethorphan (PROMETHAZINE-DM) 6.25-15 MG/5ML syrup Take 5 mLs by mouth as directed.   . traMADol (ULTRAM) 50 MG tablet Take 1 tablet (50 mg total) by mouth every 6 (six) hours as needed.  . triamcinolone cream (KENALOG) 0.1 % MIX WITH CETAPHIL MOISTURIZING CREAM. APPLY THREE TIMES DAILY AS NEEDED  . vitamin C (ASCORBIC ACID) 500 MG tablet Take 500 mg by mouth.     Allergies:   Cyclobenzaprine, Amitriptyline hcl, Aspirin, Citalopram, Escitalopram oxalate, Escitalopram oxalate, Furosemide, Gabapentin, Hydrocodone, Hydrocodone-acetaminophen, Lorazepam, Oxycodone, Oxycodone-acetaminophen, Terbinafine, Terfenadine,  Tramadol, Codeine, Lactulose, Latex, Levofloxacin, Moxifloxacin, Pantoprazole, Polyethylene glycol 3350, and Rabeprazole   Social History   Socioeconomic History  . Marital status: Widowed    Spouse name: Not on file  . Number of children: 4  . Years of education: Not on file  . Highest education level: Not on file  Occupational History  . 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 Smoking status: Never Smoker  . Smokeless tobacco: Never Used  Substance and Sexual Activity  . Alcohol use: No  . Drug use: No  . Sexual activity: Not on file  Other Topics Concern  . Not on file  Social History Narrative  . Not on file   Social Determinants of Health   Financial Resource Strain: Not on file  Food Insecurity: Not on file  Transportation Needs: Not on file  Physical Activity: Not on file  Stress: Not on file  Social Connections: Not on file    Family History: The patient's family history includes Allergies in her brother; Asthma in her sister; Breast cancer in her sister; Cancer in her brother, maternal aunt, mother, sister, and another family member; Coronary artery disease in her father; Diabetes in an other family member. History of coronary artery disease notable for son (two heart attacks, and father). History of heart failure notable for no members. History of arrhythmia notable for no members.  ROS:    Please see the history of present illness.    Still notes bilateral knee pain. All other systems reviewed and are negative.  EKGs/Labs/Other Studies Reviewed:    The following studies were reviewed today:  EKG:   10/04/2020 SR rate 87, Low voltage (percordal leads)  10/12/2019- Sinus 74 No ST/T changes  Transthoracic Echocardiogram: Date: 11/01/2020 Results:  1. Left ventricular ejection fraction, by estimation, is 55 to 60%. The  left ventricle has normal function. The left ventricle has no regional  wall motion abnormalities. There is mild concentric left ventricular  hypertrophy. Left ventricular diastolic  parameters are consistent with Grade I diastolic dysfunction (impaired  relaxation).  2. Right ventricular systolic function is normal. The right ventricular  size is normal.  3. Pulmonic valve regurgitation is moderate.  4. The mitral valve is grossly normal. No evidence of mitral valve  regurgitation.  5. The aortic valve is grossly normal. There is mild calcification of the  aortic valve. Aortic valve regurgitation is not visualized.  6. The inferior vena cava is normal in size with <50% respiratory  variability, suggesting right atrial pressure of 8 mmHg.    Recent Labs: No results found for requested labs within last 8760 hours.  Recent Lipid Panel    Component Value Date/Time   CHOL 174 12/10/2016 0933   TRIG 90 12/10/2016 0933   HDL 81 12/10/2016 0933   CHOLHDL 2.1 12/10/2016 0933   CHOLHDL 2 06/29/2013 1024   VLDL 10.2 06/29/2013 1024   LDLCALC 75 12/10/2016 0933   L Physical Exam:    VS:  BP 128/72   Pulse 83   Ht 5' 5.5" (1.664 m)   Wt 190 lb (86.2 kg)   SpO2 95%   BMI 31.14 kg/m     Wt Readings from Last 3 Encounters:  12/27/20 190 lb (86.2 kg)  11/25/20 177 lb (80.3 kg)  10/04/20 188 lb (85.3 kg)    GEN: Well nourished, well developed in no acute distress HEENT: Normal NECK: No JVD; No carotid bruits LYMPHATICS:  No  lymphadenopathy CARDIAC: RRR, II/VI systolic crescendo, rubs, gallops RESPIRATORY:  Clear to auscultation without rales, wheezing or rhonchi  ABDOMEN: Soft, non-tender, non-distended MUSCULOSKELETAL:  No edema; No deformity  SKIN: Warm and dry, skins is slightly mottled without hair bilaterally NEUROLOGIC:  Alert and oriented x 3 PSYCHIATRIC:  Normal affect   ASSESSMENT:    1. PVD (peripheral vascular disease) ([place])   2. Pulmonary valve insufficiency, unspecified etiology    PLAN:    In order of problems listed above:  Moderate Pulmonic Regurgitation - will repeat echocardiogram in one year  Essential Hypertension - ambulatory blood pressure 120/70, will continue ambulatory BP monitoring; gave education on how to perform ambulatory blood pressure monitoring including the frequency and technique; goal ambulatory blood pressure < 135/85 on average - continue home medications (presently on no anti-hypertensives) - discussed diet (DASH/low sodium), and exercise/weight loss interventions (focusing on caloric and salt intake)   PAD Hyperlipidemia Prior aneurysmal stroke -will check lipids today - will repeat ABIs at next visit (gave education to look for claudication pain or new ulcers) - continue compression stockings - would meet indications for at least plavix therapy - gave education on dietary changes - possible ASA or at least Plavix start post procedure  Six monthes follow up unless new symptoms or abnormal test results warranting change in plan  Would be reasonable for  Video Visit Follow up Would be reasonable for  APP Follow up   Shared Decision Making/Informed Consent      Medication Adjustments/Labs and Tests Ordered: Current medicines are reviewed at length with the patient today.  Concerns regarding medicines are outlined above.  Orders Placed This Encounter  Procedures  . ECHOCARDIOGRAM COMPLETE  . VAS [place] ABI WITH/WO TBI  . VAS [place] LOWER EXTREMITY ARTERIAL  DUPLEX   No orders of the defined types were placed in this encounter.   Patient Instructions  Medication Instructions:  Your physician recommends that you continue on your current medications as directed. Please refer to the Current Medication list given to you today.  *If you need a refill on your cardiac medications before your next appointment, please call your pharmacy*   Lab Work: TODAY: Lipid Panel  If you have labs (blood work) drawn today and your tests are completely normal, you will receive your results only by: Marland Kitchen MyChart Message (if you have MyChart) OR . A paper copy in the mail If you have any lab test that is abnormal or we need to change your treatment, we will call you to review the results.   Testing/Procedures: Your physician has requested that you have an echocardiogram. Echocardiography is a painless test that uses sound waves to create images of your heart. It provides your doctor with information about the size and shape of your heart and how well your heart's chambers and valves are working. This procedure takes approximately one hour. There are no restrictions for this procedure. TO BE DONE IN 1 YEAR.   Your physician has requested that you have an ankle brachial index (ABI). During this test an ultrasound and blood pressure cuff are used to evaluate the arteries that supply the arms and legs with blood. Allow thirty minutes for this exam. There are no restrictions or special instructions.  Your physician has requested that you have a lower  arterial duplex. This test is an ultrasound of the arteries in the legs. It looks at arterial blood flow in the legs. Allow one hour for Lower Arterial scans. There are  no restrictions or special instructions    Follow-Up: At [facility], you and your health needs are our priority.  As part of our continuing mission to provide you with exceptional heart care, we have created designated Provider Care Teams.  These Care  Teams include your primary Cardiologist (physician) and Advanced Practice Providers (APPs -  Physician Assistants and Nurse Practitioners) who all work together to provide you with the care you need, when you need it.  We recommend signing up for the patient portal called "MyChart".  Sign up information is provided on this After Visit Summary.  MyChart is used to connect with patients for Virtual Visits (Telemedicine).  Patients are able to view lab/test results, encounter notes, upcoming appointments, etc.  Non-urgent messages can be sent to your provider as well.   To learn more about what you can do with MyChart, go to [email or web address].    Your next appointment:   6 month(s)  The format for your next appointment:   In Person  Provider:   You may see Gasper Sells, MD or one of the following Advanced Practice Providers on your designated Care Team:    Melina Copa, PA-C  Ermalinda Barrios, PA-C       Signed, Werner Lean, MD  12/27/2020 9:53 AM    [place]

## 2020-12-28 ENCOUNTER — Telehealth: Payer: Self-pay

## 2020-12-28 DIAGNOSIS — I739 Peripheral vascular disease, unspecified: Secondary | ICD-10-CM

## 2020-12-28 MED ORDER — ROSUVASTATIN CALCIUM 10 MG PO TABS: 10.0000 mg | ORAL_TABLET | Freq: Every day | ORAL | 3 refills | Status: DC

## 2020-12-28 NOTE — Telephone Encounter (Signed)
-----   Message from Werner Lean, MD sent at 12/28/2020  8:24 AM EST ----- Results: Cholesterol above goal for patient with PAD Plan: Would offer rosuvastatin 10 mg PO Daily and repeat labs in 3 months  Werner Lean, MD

## 2020-12-28 NOTE — Telephone Encounter (Signed)
Pt called and was told that her cholesterol was elevated and that Dr.Chandrasekhar recommended for her to start  rosuvastatin 10 mg PO Daily and repeat labs in 3 months, pt agreed to this plan, denied any questions or concerns. RN scheduled repeat lab for 03/27/21, Monday.

## 2021-01-09 ENCOUNTER — Other Ambulatory Visit: Payer: Self-pay

## 2021-01-09 ENCOUNTER — Ambulatory Visit (HOSPITAL_COMMUNITY)
Admission: RE | Admit: 2021-01-09 | Discharge: 2021-01-09 | Disposition: A | Discharge status: Home / Self Care | Payer: Medicare Other | Source: Ambulatory Visit | Attending: Cardiology | Admitting: Cardiology

## 2021-01-09 DIAGNOSIS — I739 Peripheral vascular disease, unspecified: Secondary | ICD-10-CM | POA: Diagnosis not present

## 2021-03-27 ENCOUNTER — Other Ambulatory Visit: Payer: Self-pay | Admitting: Internal Medicine

## 2021-03-27 ENCOUNTER — Other Ambulatory Visit: Payer: Medicare Other | Admitting: *Deleted

## 2021-03-27 ENCOUNTER — Other Ambulatory Visit: Payer: Self-pay

## 2021-03-27 DIAGNOSIS — I739 Peripheral vascular disease, unspecified: Secondary | ICD-10-CM

## 2021-03-27 LAB — LIPID PANEL
Chol/HDL Ratio: 2.7 ratio (ref 0.0–4.4)
Cholesterol, Total: 186 mg/dL (ref 100–199)
HDL: 70 mg/dL (ref >39)
LDL Chol Calc (NIH): 102 mg/dL — ABNORMAL HIGH (ref 0–99)
Triglycerides: 76 mg/dL (ref 0–149)
VLDL Cholesterol Cal: 14 mg/dL (ref 5–40)

## 2021-03-28 ENCOUNTER — Telehealth: Payer: Self-pay

## 2021-03-28 MED ORDER — EZETIMIBE 10 MG PO TABS: 10.0000 mg | ORAL_TABLET | Freq: Every day | ORAL | 11 refills | Status: DC

## 2021-03-28 NOTE — Telephone Encounter (Signed)
-----   Message from Werner Lean, MD sent at 03/28/2021 12:02 PM EDT ----- Can offer her Zetia 10 mg PO daily ----- Message ----- From: Precious Gilding, RN Sent: 03/28/2021  11:50 AM EDT To: Werner Lean, MD  Called patient notified her of results.  She reports that she is not taking rosuvastatin.  Per she took 1 dose and it caused side effects.  She could barely walk, made her nervous/ jittery and she felt like she couldn't think right.  I briefly reviewed ways to improve cholesterol with patient.  She has eaten a lot of fatty and take out food recently d/t daughter visiting with her.  I advised her that I will route her concerns to Dr. Gasper Sells.

## 2021-03-28 NOTE — Telephone Encounter (Signed)
I notified patient of Dr. Gasper Sells recommendation that she start Zetia 10 mg PO QD.  She is willing to try medication.  She request that I send in a 30  Day supply to ensure she can tolerate medication.  Sent to [facility] per pt request.

## 2021-05-22 ENCOUNTER — Encounter (HOSPITAL_BASED_OUTPATIENT_CLINIC_OR_DEPARTMENT_OTHER): Payer: Self-pay | Admitting: Urology

## 2021-05-22 ENCOUNTER — Other Ambulatory Visit: Payer: Self-pay

## 2021-05-22 ENCOUNTER — Emergency Department (HOSPITAL_BASED_OUTPATIENT_CLINIC_OR_DEPARTMENT_OTHER): Payer: Medicare Other

## 2021-05-22 ENCOUNTER — Emergency Department (HOSPITAL_BASED_OUTPATIENT_CLINIC_OR_DEPARTMENT_OTHER)
Admission: EM | Admit: 2021-05-22 | Discharge: 2021-05-22 | Disposition: A | Discharge status: Home / Self Care | Payer: Medicare Other | Attending: Emergency Medicine | Admitting: Emergency Medicine

## 2021-05-22 DIAGNOSIS — Z96651 Presence of right artificial knee joint: Secondary | ICD-10-CM | POA: Insufficient documentation

## 2021-05-22 DIAGNOSIS — R519 Headache, unspecified: Secondary | ICD-10-CM | POA: Diagnosis not present

## 2021-05-22 DIAGNOSIS — J320 Chronic maxillary sinusitis: Secondary | ICD-10-CM

## 2021-05-22 DIAGNOSIS — E039 Hypothyroidism, unspecified: Secondary | ICD-10-CM | POA: Insufficient documentation

## 2021-05-22 DIAGNOSIS — Z91040 Latex allergy status: Secondary | ICD-10-CM | POA: Insufficient documentation

## 2021-05-22 DIAGNOSIS — I10 Essential (primary) hypertension: Secondary | ICD-10-CM | POA: Diagnosis not present

## 2021-05-22 DIAGNOSIS — Z86018 Personal history of other benign neoplasm: Secondary | ICD-10-CM | POA: Diagnosis not present

## 2021-05-22 DIAGNOSIS — M542 Cervicalgia: Secondary | ICD-10-CM | POA: Diagnosis not present

## 2021-05-22 LAB — BASIC METABOLIC PANEL
Anion gap: 7 (ref 5–15)
BUN: 15 mg/dL (ref 8–23)
CO2: 29 mmol/L (ref 22–32)
Calcium: 9.2 mg/dL (ref 8.9–10.3)
Chloride: 99 mmol/L (ref 98–111)
Creatinine, Ser: 0.89 mg/dL (ref 0.44–1.00)
GFR, Estimated: >60 mL/min (ref >60)
Glucose, Bld: 90 mg/dL (ref 70–99)
Potassium: 4.2 mmol/L (ref 3.5–5.1)
Sodium: 135 mmol/L (ref 135–145)

## 2021-05-22 LAB — CBC WITH DIFFERENTIAL/PLATELET
Abs Immature Granulocytes: 0.01 10*3/uL (ref 0.00–0.07)
Basophils Absolute: 0 10*3/uL (ref 0.0–0.1)
Basophils Relative: 0 %
Eosinophils Absolute: 0.2 10*3/uL (ref 0.0–0.5)
Eosinophils Relative: 3 %
HCT: 37 % (ref 36.0–46.0)
Hemoglobin: 11.1 g/dL — ABNORMAL LOW (ref 12.0–15.0)
Immature Granulocytes: 0 %
Lymphocytes Relative: 41 %
Lymphs Abs: 2.8 10*3/uL (ref 0.7–4.0)
MCH: 25.6 pg — ABNORMAL LOW (ref 26.0–34.0)
MCHC: 30 g/dL (ref 30.0–36.0)
MCV: 85.3 fL (ref 80.0–100.0)
Monocytes Absolute: 0.9 10*3/uL (ref 0.1–1.0)
Monocytes Relative: 13 %
Neutro Abs: 2.9 10*3/uL (ref 1.7–7.7)
Neutrophils Relative %: 43 %
Platelets: 243 10*3/uL (ref 150–400)
RBC: 4.34 MIL/uL (ref 3.87–5.11)
RDW: 15.3 % (ref 11.5–15.5)
WBC: 6.8 10*3/uL (ref 4.0–10.5)
nRBC: 0 % (ref 0.0–0.2)

## 2021-05-22 MED ORDER — DICLOFENAC SODIUM 1 % EX GEL: 4.0000 g | Freq: Four times a day (QID) | CUTANEOUS | 0 refills | Status: AC

## 2021-05-22 MED ORDER — IOHEXOL 350 MG/ML SOLN
100.0000 mL | Freq: Once | INTRAVENOUS | Status: AC | PRN
  Administered 2021-05-22: 100 mL via INTRAVENOUS

## 2021-05-22 MED ORDER — AMOXICILLIN-POT CLAVULANATE 875-125 MG PO TABS
1.0000 | ORAL_TABLET | Freq: Once | ORAL | Status: AC
  Administered 2021-05-22: 1 via ORAL
  Filled 2021-05-22: qty 1

## 2021-05-22 MED ORDER — AMOXICILLIN-POT CLAVULANATE 875-125 MG PO TABS: 1.0000 | ORAL_TABLET | Freq: Two times a day (BID) | ORAL | 0 refills | Status: DC

## 2021-05-22 MED ORDER — FENTANYL CITRATE (PF) 100 MCG/2ML IJ SOLN
50.0000 ug | Freq: Once | INTRAMUSCULAR | Status: AC
  Administered 2021-05-22: 14:00:00 50 ug via INTRAVENOUS
  Filled 2021-05-22: qty 2

## 2021-05-22 MED ORDER — KETOROLAC TROMETHAMINE 15 MG/ML IJ SOLN
15.0000 mg | Freq: Once | INTRAMUSCULAR | Status: AC
  Administered 2021-05-22: 15 mg via INTRAVENOUS
  Filled 2021-05-22: qty 1

## 2021-05-22 MED ORDER — FENTANYL CITRATE (PF) 100 MCG/2ML IJ SOLN
50.0000 ug | Freq: Once | INTRAMUSCULAR | Status: AC
  Administered 2021-05-22: 50 ug via INTRAVENOUS
  Filled 2021-05-22: qty 2

## 2021-05-22 MED ORDER — ACETAMINOPHEN 325 MG PO TABS
650.0000 mg | ORAL_TABLET | Freq: Once | ORAL | Status: AC
  Administered 2021-05-22: 650 mg via ORAL
  Filled 2021-05-22: qty 2

## 2021-05-22 MED ORDER — SODIUM CHLORIDE 0.9 % IV BOLUS
500.0000 mL | Freq: Once | INTRAVENOUS | Status: AC
  Administered 2021-05-22: 500 mL via INTRAVENOUS

## 2021-05-22 MED ORDER — CYCLOBENZAPRINE HCL 10 MG PO TABS: 10.0000 mg | ORAL_TABLET | Freq: Two times a day (BID) | ORAL | 0 refills | Status: AC | PRN

## 2021-05-22 NOTE — Discharge Instructions (Addendum)
Follow-up with your family doctor.  Please return for worsening neck pain or if you develop a fever.  We are going to try another round of Augmentin to see if that clears your sinus infection.  Be careful with the muscle relaxant as it can make you sleepy and could make it more easy for you to fall or have an accident.  Use the gel as prescribed.  You can then take up to 2 extra strength Tylenol tablets 4 times a day.

## 2021-05-22 NOTE — ED Provider Notes (Signed)
[facility] Provider Note   CSN: [id number] Arrival date & time: 05/22/21  1018     History Chief Complaint  Patient presents with   Neck Pain   sinus congestion    Angela Wang is a 87 y.o. female.  HPI 87 year old female presents with neck pain and headache.  Symptoms started on 6/24 with neck pain and stiffness.  Bilateral neck, left worse than right hurts.  It is painful to move.  Yesterday she developed a headache that is global.  Got better with Tylenol which prevented her from coming to the ER last night but it came back today.  She has not taken anything for it today.  She has a remote history of an aneurysm being coiled over a decade ago.  She has chronic right-sided weakness from a stroke but no new weakness.  No vomiting.  She is been dealing with sinus infection with congestion and discharge/drainage that is green for over a month.  Took antibiotics but it did not seem to help.  No vision changes.  Past Medical History:  Diagnosis Date   ALLERGIC RHINITIS 06/21/2007   Qualifier: Diagnosis of  By: Wynona Luna    Allergy    allergic rhinitis   Anemia    nos   ANEMIA-NOS 06/21/2007   Qualifier: Diagnosis of  By: Wynona Luna    ANXIETY 06/21/2007   Qualifier: Diagnosis of  By: Wynona Luna    Arthritis    osteoarthritis   Atypical chest pain 09/2008   negative myoview   BACK PAIN, LUMBAR 03/06/2010   Qualifier: Diagnosis of  By: Wynona Luna    CEREBRAL ANEURYSM 06/21/2007   Qualifier: History of  By: Yoo DO, [place], HX OF 06/21/2007   Qualifier: Diagnosis of  By: Wynona Luna    CHEST PAIN, ATYPICAL 09/02/2008   Qualifier: Diagnosis of  By: Wynona Luna    Chronic sinusitis    CONSTIPATION, CHRONIC 09/20/2009   Qualifier: Diagnosis of  By: Olevia Perches MD, Lowella Bandy    Depression    DEPRESSION 06/21/2007   Qualifier: Diagnosis of  By: Wynona Luna    Diverticulosis     DIVERTICULOSIS-COLON 06/09/2008   Qualifier: Diagnosis of  By: Chester Holstein NP, Nevin Bloodgood     Fibromyalgia    FIBROMYALGIA 06/21/2007   Qualifier: Diagnosis of  By: Berneta Sages, HX OF 08/25/2008   Qualifier: Diagnosis of  By: Nelson-Smith CMA (AAMA), Dottie     GERD 05/03/2008   Qualifier: Diagnosis of  By: Julien Girt CMA, Leigh     GERD (gastroesophageal reflux disease)    Headache 03/15/2011   History of solitary pulmonary nodule    right, stable by CT   Hyperlipidemia    HYPERLIPIDEMIA 06/21/2007   Qualifier: Diagnosis of  By: Wynona Luna    Hypertension    HYPERTENSION 06/21/2007   Qualifier: Diagnosis of  By: Wynona Luna    HYPOGLYCEMIA 10/24/2010   Qualifier: Diagnosis of  By: Wynona Luna    HYPOTHYROIDISM 07/22/2008   Qualifier: Diagnosis of  By: Wynona Luna    IBS 06/21/2007   Qualifier: Diagnosis of  By: Wynona Luna    IBS (irritable bowel syndrome)    INSOMNIA, CHRONIC 07/19/2009   Qualifier: Diagnosis of  By: Wynona Luna  LEG PAIN, RIGHT 02/01/2009   Qualifier: Diagnosis of  By: Shawna Orleans DO, Keturah Barre Robert    Leukopenia    chronic   LEUKOPENIA, CHRONIC 06/21/2007   Qualifier: Diagnosis of  By: Wynona Luna    NECK PAIN 02/03/2008   Qualifier: Diagnosis of  By: Wynona Luna    NUMBNESS 01/29/2008   Qualifier: Diagnosis of  By: Loanne Drilling MD, Derek Jack NOS-Unspec 06/21/2007   Centricity Description: DEGENERATIVE JOINT DISEASE, MILD Qualifier: Diagnosis of  By: Wynona Luna  Centricity Description: OSTEOARTHRITIS Qualifier: Diagnosis of  By: Wynona Luna    PULMONARY NODULE 12/26/2007   Qualifier: Diagnosis of  By: Wynona Luna    Routine general medical examination at a health care facility    SCIATICA, RIGHT 05/06/2008   Qualifier: Diagnosis of  By: Fredrich Birks, Sarah     Stroke [facility])    THROMBOCYTOPENIA 02/09/2009   Qualifier: Diagnosis of  By: Wynona Luna    UNSTEADY GAIT 07/28/2009   Qualifier: Diagnosis  of  By: Wynona Luna    VENOUS INSUFFICIENCY 05/03/2008   Qualifier: Diagnosis of  By: Julien Girt CMA, Marliss Czar      Patient Active Problem List   Diagnosis Date Noted   Pulmonary valve insufficiency 12/27/2020   Palpitations 10/04/2020   Heart murmur, systolic 74/16/3845   Essential hypertension 10/04/2020   PAD (peripheral artery disease) ([place]) 05/09/2016   Seasonal allergic rhinitis due to pollen 05/09/2016   Benign neoplasm of left retina 01/10/2016   Pseudophakia of both eyes 01/10/2016   Pseudophakia 01/10/2016   Retinal edema 01/10/2016   Decreased mobility and endurance 11/22/2015   Pain due to knee joint prosthesis ([place]) 10/02/2013   Cerebral embolism with cerebral infarction ([place]) 07/10/2013   Bilateral posterior capsular opacification 05/19/2012   Branch retinal vein occlusion 02/08/2012   Cystoid macular edema 02/08/2012   Macular degeneration 02/08/2012   Headache(784.0) 03/15/2011   HYPOGLYCEMIA 10/24/2010   BACK PAIN, LUMBAR 03/06/2010   CONSTIPATION, CHRONIC 09/20/2009   SINUSITIS, CHRONIC 08/16/2009   UNSTEADY GAIT 07/28/2009   INSOMNIA, CHRONIC 07/19/2009   THROMBOCYTOPENIA 02/09/2009   LEG PAIN, RIGHT 02/01/2009   Other abnormal glucose 02/01/2009   CHEST PAIN, ATYPICAL 09/02/2008   GASTRITIS, HX OF 08/25/2008   HYPOTHYROIDISM 07/22/2008   DIVERTICULOSIS-COLON 06/09/2008   Sciatica of left side 05/06/2008   VENOUS INSUFFICIENCY 05/03/2008   GERD 05/03/2008   NECK PAIN 02/03/2008   NUMBNESS 01/29/2008   PULMONARY NODULE 12/26/2007   Hyperlipidemia 06/21/2007   ANEMIA-NOS 06/21/2007   LEUKOPENIA, CHRONIC 06/21/2007   ANXIETY 06/21/2007   DEPRESSION 06/21/2007   HYPERTENSION 06/21/2007   CEREBRAL ANEURYSM 06/21/2007   ALLERGIC RHINITIS 06/21/2007   IBS 06/21/2007   Osteoarthrosis, unspecified whether generalized or localized, unspecified site 06/21/2007   FIBROMYALGIA 06/21/2007   CEREBROVASCULAR ACCIDENT, HX OF 06/21/2007    Past Surgical  History:  Procedure Laterality Date   ABDOMINAL HYSTERECTOMY     ANEURYSM COILING     APPENDECTOMY     CHOLECYSTECTOMY     JOINT REPLACEMENT     total right knee replacement   KNEE SURGERY       OB History   No obstetric history on file.     Family History  Problem Relation Age of Onset   Cancer Mother        pancreatic   Coronary artery disease Father    Cancer Sister  breast   Asthma Sister    Breast cancer Sister    Cancer Brother        prostate   Allergies Brother    Cancer Maternal Aunt        colon   Diabetes Other    Cancer Other        ovarian    Social History   Tobacco Use   Smoking status: Never   Smokeless tobacco: Never  Substance Use Topics   Alcohol use: No   Drug use: No    Home Medications Prior to Admission medications   Medication Sig Start Date End Date Taking? Authorizing Provider  acetaminophen-codeine (TYLENOL #3) 300-30 MG tablet Take 1 tablet by mouth every 8 (eight) hours as needed for moderate pain. 01/18/20   Newt Minion, MD  ALPRAZolam Duanne Moron) 0.5 MG tablet Take 0.5 mg by mouth 3 (three) times daily as needed for sleep or anxiety.    [provider]  B Complex Vitamins (B COMPLEX 100 PO) Take 1 tablet by mouth daily.    [provider]  Calcium-Magnesium-Vitamin D [phone] MG-MG-UNIT TABS Take 1 tablet by mouth daily.    [provider]  co-enzyme Q-10 30 MG capsule Take 30 mg by mouth 3 (three) times daily.    [provider]  ezetimibe (ZETIA) 10 MG tablet Take 1 tablet (10 mg total) by mouth daily. 03/28/21   Chandrasekhar, Terisa Starr, MD  fluticasone (FLONASE) 50 MCG/ACT nasal spray Place 2 sprays into both nostrils as needed for allergies or rhinitis.    [provider]  folic acid (FOLVITE) 1 MG tablet Take 1 mg by mouth daily. 01/25/20   [provider]  gabapentin (NEURONTIN) 300 MG capsule Take 300 mg by mouth daily. 09/26/20   [provider]  iron  polysaccharides (NIFEREX) 150 MG capsule Take 150 mg by mouth daily. 02/11/18   [provider]  lidocaine (XYLOCAINE) 5 % ointment Apply 1 application topically as directed. 03/04/18   [provider]  linaclotide Rolan Lipa) 72 MCG capsule Take 1 capsule (72 mcg total) by mouth daily before breakfast. 09/07/19   Levin Erp, PA  Olopatadine HCl 0.2 % SOLN Apply 1 drop to eye daily.    [provider]  omeprazole (PRILOSEC) 40 MG capsule TAKE ONE CAPSULE IN THE MORNING, 30-60 MINUTES BEFORE BREAKFAST. 03/27/21   Irene Shipper, MD  promethazine-dextromethorphan (PROMETHAZINE-DM) 6.25-15 MG/5ML syrup Take 5 mLs by mouth as directed. 12/18/18   [provider]  rosuvastatin (CRESTOR) 10 MG tablet Take 1 tablet (10 mg total) by mouth daily. 12/28/20 03/28/21  Werner Lean, MD  traMADol (ULTRAM) 50 MG tablet Take 1 tablet (50 mg total) by mouth every 6 (six) hours as needed. 11/25/20   Henderly, Britni A, PA-C  triamcinolone cream (KENALOG) 0.1 % MIX WITH CETAPHIL MOISTURIZING CREAM. APPLY THREE TIMES DAILY AS NEEDED 10/30/16   [provider]  vitamin C (ASCORBIC ACID) 500 MG tablet Take 500 mg by mouth.    [provider]    Allergies    Cyclobenzaprine, Amitriptyline hcl, Aspirin, Citalopram, Escitalopram oxalate, Escitalopram oxalate, Furosemide, Gabapentin, Hydrocodone, Hydrocodone-acetaminophen, Lorazepam, Oxycodone, Oxycodone-acetaminophen, Terbinafine, Terfenadine, Tramadol, Codeine, Lactulose, Latex, Levofloxacin, Moxifloxacin, Pantoprazole, Polyethylene glycol 3350, and Rabeprazole  Review of Systems   Review of Systems  Constitutional:  Negative for fever.  Eyes:  Negative for visual disturbance.  Cardiovascular:  Negative for chest pain.  Gastrointestinal:  Negative for vomiting.  Musculoskeletal:  Positive for  neck pain and neck stiffness.  Neurological:  Positive for headaches. Negative for dizziness, weakness and  numbness.   Physical Exam Updated Vital Signs BP (!) 166/81 (BP Location: Left Arm)   Pulse 61   Temp 98 F (36.7 C) (Oral)   Resp 19   Ht 5\' 5"  (1.651 m)   Wt 77.1 kg   SpO2 96%   BMI 28.29 kg/m   Physical Exam Vitals and nursing note reviewed.  Constitutional:      Appearance: She is well-developed.  HENT:     Head: Normocephalic and atraumatic.     Right Ear: External ear normal.     Left Ear: External ear normal.     Nose: Nose normal.  Eyes:     General:        Right eye: No discharge.        Left eye: No discharge.     Extraocular Movements: Extraocular movements intact.     Pupils: Pupils are equal, round, and reactive to light.  Cardiovascular:     Rate and Rhythm: Normal rate and regular rhythm.     Heart sounds: Normal heart sounds.  Pulmonary:     Effort: Pulmonary effort is normal.     Breath sounds: Normal breath sounds.  Abdominal:     Palpations: Abdomen is soft.     Tenderness: There is no abdominal tenderness.  Musculoskeletal:     Cervical back: Muscular tenderness (bilateral paraspinal) present. Decreased range of motion.  Skin:    General: Skin is warm and dry.  Neurological:     Mental Status: She is alert.     Comments: CN 3-12 grossly intact. 5/5 strength in all 4 extremities. Grossly normal sensation. Normal finger to nose.   Psychiatric:        Mood and Affect: Mood is not anxious.    ED Results / Procedures / Treatments   Labs (all labs ordered are listed, but only abnormal results are displayed) Labs Reviewed  CBC WITH DIFFERENTIAL/PLATELET - Abnormal; Notable for the following components:      Result Value   Hemoglobin 11.1 (*)    MCH 25.6 (*)    All other components within normal limits  BASIC METABOLIC PANEL    EKG EKG Interpretation  Date/Time:  Monday May 22 2021 13:04:22 EDT Ventricular Rate:  70 PR Interval:  185 QRS Duration: 98 QT Interval:  422 QTC Calculation: 456 R Axis:   47 Text Interpretation: Sinus  rhythm Low voltage, precordial leads no acute ST/T changes similar to Nov 2020 Confirmed by Sherwood Gambler [phone]) on 05/22/2021 1:20:28 PM  Radiology No results found.  Procedures Procedures   Medications Ordered in ED Medications  ketorolac (TORADOL) 15 MG/ML injection 15 mg (has no administration in time range)  fentaNYL (SUBLIMAZE) injection 50 mcg (50 mcg Intravenous Given 05/22/21 1338)  sodium chloride 0.9 % bolus 500 mL (500 mLs Intravenous New Bag/Given 05/22/21 1337)  acetaminophen (TYLENOL) tablet 650 mg (650 mg Oral Given 05/22/21 1256)  fentaNYL (SUBLIMAZE) injection 50 mcg (50 mcg Intravenous Given 05/22/21 1412)  iohexol (OMNIPAQUE) 350 MG/ML injection 100 mL (100 mLs Intravenous Contrast Given 05/22/21 1456)    ED Course  I have reviewed the triage vital signs and the nursing notes.  Pertinent labs & imaging results that were available during my care of the patient were reviewed by me and considered in my medical decision making (see chart for details).    MDM Rules/Calculators/A&P  Patient with posterior neck pain/stiffness and headache.  These have been progressive symptoms.  She is not febrile.  Neuro exam is unremarkable.  I suspect this is muscular/spasm in nature.  However with her remote history of aneurysm I think CTA would be reasonable.  Labs are unremarkable.  She is noticing some moderate improvement with fentanyl and will add on some Toradol.  I have a low suspicion this is a CNS infection.  Care transferred to Dr. Tyrone Nine. Final Clinical Impression(s) / ED Diagnoses Final diagnoses:  None    Rx / DC Orders ED Discharge Orders     None        Sherwood Gambler, MD 05/22/21 1506

## 2021-05-22 NOTE — ED Provider Notes (Signed)
I received the patient in signout from Dr. Regenia Skeeter briefly the patient is a 87 year old female with a chief complaints of chronic sinus problems and acute neck pain.  Patient is been complaining of a bit of a stiff neck over 3 to 4 days.  Worse with movement twisting.  Plan for a CT angiogram of the head and the neck and reassessment after Toradol.  On reassessment the patient is feeling a bit better.  No meningeal signs on my exam.  She tells me that she has had improvement with Flexeril in the past.  Tells me that Augmentin is the only thing that works for her sinus infections.  We will try both of these for her.  Diclofenac gel.  Tylenol.  PCP follow-up.   Deno Etienne, DO 05/22/21 1553

## 2021-05-22 NOTE — ED Triage Notes (Signed)
Pt states sinus congestion, HA x 2 weeks, states Neck pain and stiffness x 2 days

## 2021-05-22 NOTE — ED Notes (Signed)
Patient given crackers

## 2021-08-09 ENCOUNTER — Other Ambulatory Visit: Payer: Self-pay | Admitting: Obstetrics and Gynecology

## 2021-08-09 DIAGNOSIS — Z1231 Encounter for screening mammogram for malignant neoplasm of breast: Secondary | ICD-10-CM

## 2021-09-12 ENCOUNTER — Ambulatory Visit
Admission: RE | Admit: 2021-09-12 | Discharge: 2021-09-12 | Disposition: A | Discharge status: Home / Self Care | Payer: Medicare Other | Source: Ambulatory Visit | Attending: Obstetrics and Gynecology | Admitting: Obstetrics and Gynecology

## 2021-09-12 ENCOUNTER — Other Ambulatory Visit: Payer: Self-pay

## 2021-09-12 DIAGNOSIS — Z1231 Encounter for screening mammogram for malignant neoplasm of breast: Secondary | ICD-10-CM

## 2021-10-13 ENCOUNTER — Other Ambulatory Visit: Payer: Self-pay | Admitting: Internal Medicine

## 2021-12-26 ENCOUNTER — Other Ambulatory Visit: Payer: Self-pay

## 2021-12-26 ENCOUNTER — Ambulatory Visit (HOSPITAL_COMMUNITY): Payer: Medicare Other | Attending: Internal Medicine

## 2021-12-26 DIAGNOSIS — I371 Nonrheumatic pulmonary valve insufficiency: Secondary | ICD-10-CM

## 2021-12-26 LAB — ECHOCARDIOGRAM COMPLETE
Area-P 1/2: 3.43 cm2
S' Lateral: 2.1 cm

## 2022-01-25 ENCOUNTER — Ambulatory Visit (INDEPENDENT_AMBULATORY_CARE_PROVIDER_SITE_OTHER): Payer: Medicare Other

## 2022-01-25 ENCOUNTER — Ambulatory Visit: Payer: Medicare Other | Admitting: Podiatry

## 2022-01-25 ENCOUNTER — Other Ambulatory Visit: Payer: Self-pay

## 2022-01-25 ENCOUNTER — Ambulatory Visit: Payer: Medicare Other

## 2022-01-25 DIAGNOSIS — M79672 Pain in left foot: Secondary | ICD-10-CM

## 2022-01-25 DIAGNOSIS — L84 Corns and callosities: Secondary | ICD-10-CM | POA: Diagnosis not present

## 2022-01-25 DIAGNOSIS — S90852A Superficial foreign body, left foot, initial encounter: Secondary | ICD-10-CM

## 2022-01-25 DIAGNOSIS — L97521 Non-pressure chronic ulcer of other part of left foot limited to breakdown of skin: Secondary | ICD-10-CM

## 2022-01-25 MED ORDER — DOXYCYCLINE HYCLATE 100 MG PO TABS: 100.0000 mg | ORAL_TABLET | Freq: Two times a day (BID) | ORAL | 0 refills | Status: DC

## 2022-01-25 MED ORDER — MUPIROCIN 2 % EX OINT: 1.0000 "application " | TOPICAL_OINTMENT | Freq: Two times a day (BID) | CUTANEOUS | 2 refills | Status: DC

## 2022-01-29 NOTE — Progress Notes (Signed)
Subjective: ?88 year old female presents the office today for concerns of a painful bump on the second toe of the right foot.  She is also noticed some bleeding on the bottom of the left foot.  She has noticed a small spot has been bleeding and tender.  She denies stepping any foreign objects or any swelling or redness.  Area is tender to put weight on it.  No fevers or chills.  No other concerns. ? ?Objective: ?AAO x3, NAD ?DP/PT pulses palpable bilaterally, CRT less than 3 seconds ?On the plantar aspect left foot is 1 pinpoint area of the hyperkeratotic lesion and upon review there is a small pinpoint opening present.  It appears to almost a puncture wound but she does not recall stepping any foreign objects.  There is no surrounding erythema, fluctuation or crepitation. ?At the base of the right second toe dorsally is a small annular callus which could have been from an old open wound there is no edema, erythema or signs of infection at this time. ?No pain with calf compression, swelling, warmth, erythema ? ?Assessment: ?Wound left foot plantarly, preulcerative lesion right second toe ? ?Plan: ?-All treatment options discussed with the patient including all alternatives, risks, complications.  ?-X-rays obtained reviewed.  No definitive evidence of foreign body noted today. ?-Prescribed doxycycline as well as mupirocin ointment to apply daily. ?-Offloading pads dispensed. ?-Monitor for any clinical signs or symptoms of infection and directed to call the office immediately should any occur or go to the ER. ?-Patient encouraged to call the office with any questions, concerns, change in symptoms.  ? ?Trula Slade DPM ? ?

## 2022-02-08 ENCOUNTER — Other Ambulatory Visit: Payer: Self-pay

## 2022-02-08 ENCOUNTER — Ambulatory Visit (INDEPENDENT_AMBULATORY_CARE_PROVIDER_SITE_OTHER): Payer: Medicare Other | Admitting: Podiatry

## 2022-02-08 DIAGNOSIS — L97521 Non-pressure chronic ulcer of other part of left foot limited to breakdown of skin: Secondary | ICD-10-CM | POA: Diagnosis not present

## 2022-02-12 NOTE — Progress Notes (Signed)
Subjective: ?88 year old female presents the office today for follow-up evaluation of a painful bump on the second toe of the right foot and for evaluation of wound on her left foot.  She states that she still has a painful area in the bottom of her left foot.  She denies any swelling redness or any drainage.  Area is tender to put pressure.  ? ?Objective: ?AAO x3, NAD ?DP/PT pulses palpable bilaterally, CRT less than 3 seconds ?On the plantar aspect left foot is still area of hyperkeratotic tissue and there is appears to be almost hyper granulated tissue present.  Could be a pyogenic granuloma.  There is no probing, no tunneling.  No surrounding erythema, ascending cellulitis.  No fluctuation or crepitation.  No malodor.   ?During the right second toe is improving there is no pain.  There is no open lesions bilaterally otherwise. ?No pain with calf compression, swelling, warmth, erythema ? ?Assessment: ?Wound left foot plantarly, preulcerative lesion right second toe ? ?Plan: ?-All treatment options discussed with the patient including all alternatives, risks, complications.  ?-Regards to the wound in the left foot I did apply silver nitrate after cleaned it.  Dressing was then applied.  I discussed biopsy but was hesitant to proceed with this today. Offloading ?-I will order blood work including sed rate, CRP, CBC, BMP ?-Monitor for any clinical signs or symptoms of infection and directed to call the office immediately should any occur or go to the ER. ? ?Trula Slade DPM ?

## 2022-02-22 ENCOUNTER — Ambulatory Visit: Payer: Medicare Other | Admitting: Podiatry

## 2022-02-23 ENCOUNTER — Other Ambulatory Visit: Payer: Self-pay | Admitting: Podiatry

## 2022-02-23 DIAGNOSIS — S90852A Superficial foreign body, left foot, initial encounter: Secondary | ICD-10-CM

## 2022-03-16 ENCOUNTER — Telehealth: Payer: Self-pay

## 2022-03-16 ENCOUNTER — Telehealth: Payer: Self-pay | Admitting: Orthopaedic Surgery

## 2022-03-16 ENCOUNTER — Ambulatory Visit: Payer: Medicare Other | Admitting: Orthopaedic Surgery

## 2022-03-16 ENCOUNTER — Ambulatory Visit (INDEPENDENT_AMBULATORY_CARE_PROVIDER_SITE_OTHER): Payer: Medicare Other

## 2022-03-16 ENCOUNTER — Encounter: Payer: Self-pay | Admitting: Orthopaedic Surgery

## 2022-03-16 DIAGNOSIS — M1712 Unilateral primary osteoarthritis, left knee: Secondary | ICD-10-CM | POA: Diagnosis not present

## 2022-03-16 NOTE — Progress Notes (Signed)
? ?Office Visit Note ?  ?Patient: Angela Wang           ?Date of Birth: [birth year]           ?MRN: [id number] ?Visit Date: 03/16/2022 ?             ?Requested by: [place], [place], PA-C ?[address] ?[address] ?[place],  [place] [address] ?PCP: Elisabeth Cara, PA-C ? ? ?Assessment & Plan: ?Visit Diagnoses:  ?1. Primary osteoarthritis of left knee   ? ? ?Plan:  impression is left knee osteoarthritis. Today, we discussed left knee aspiration and repeat injection per her wishes.  We have also discussed getting approval for left knee visco injection.  She will follow up with [place] once approved.  ? ?This patient is diagnosed with osteoarthritis of the knee(s).   ? ?Radiographs show evidence of joint space narrowing, osteophytes, subchondral sclerosis and/or subchondral cysts.  This patient has knee pain which interferes with functional and activities of daily living.   ? ?This patient has experienced inadequate response, adverse effects and/or intolerance with conservative treatments such as acetaminophen, NSAIDS, topical creams, physical therapy or regular exercise, knee bracing and/or weight loss.  ? ?This patient has experienced inadequate response or has a contraindication to intra articular steroid injections for at least 3 months.  ? ?This patient is not scheduled to have a total knee replacement within 6 months of starting treatment with viscosupplementation. ? ? ?Follow-Up Instructions: Return if symptoms worsen or fail to improve.  ? ?Orders:  ?Orders Placed This Encounter  ?Procedures  ? Large Joint Inj: L knee  ? XR KNEE 3 VIEW LEFT  ? ?No orders of the defined types were placed in this encounter. ? ? ? ? Procedures: ?Large Joint Inj: L knee on 03/16/2022 11:45 AM ?Indications: pain ?Details: 22 G needle, anterolateral approach ?Medications: 2 mL lidocaine 1 %; 2 mL bupivacaine 0.25 %; 40 mg methylPREDNISolone acetate 40 MG/ML ? ? ? ? ?Clinical Data: ?No additional findings. ? ? ?Subjective: ?Chief  Complaint  ?Patient presents with  ? Left Knee - Pain  ? ? ?HPI patient comes in with left knee pain.  Underlying osteoarthritis.  She has pain to the entire knee worse with weight bearing and with flexion.  She has associated swelling.   Previous cortisone injection was about 3-4 months ago without significant relief.  She thinks it would have helped more if the knee had also been aspirated.  Previous visco injection has helped.   ? ?Review of Systems as detailed in HPI.  All others reviewed and are negative ? ? ?Objective: ?Vital Signs: There were no vitals taken for this visit. ? ?Physical Exam well developed and well nourished female in no acute distress ? ?Ortho Exam left knee exam show a moderate effusion.  Range of motion 0-110.  Lateral joint line tenderness.   ? ?Specialty Comments:  ?No specialty comments available. ? ?Imaging: ?XR KNEE 3 VIEW LEFT ? ?Result Date: 03/16/2022 ?Advance degenerative changes lateral and patellofemoral compartments  ? ? ?PMFS History: ?Patient Active Problem List  ? Diagnosis Date Noted  ? Pulmonary valve insufficiency 12/27/2020  ? Palpitations 10/04/2020  ? Heart murmur, systolic 54/00/8676  ? Essential hypertension 10/04/2020  ? PAD (peripheral artery disease) ([place]) 05/09/2016  ? Seasonal allergic rhinitis due to pollen 05/09/2016  ? Benign neoplasm of left retina 01/10/2016  ? Pseudophakia of both eyes 01/10/2016  ? Pseudophakia 01/10/2016  ? Retinal edema 01/10/2016  ? Decreased mobility  and endurance 11/22/2015  ? Pain due to knee joint prosthesis ([place]) 10/02/2013  ? Cerebral embolism with cerebral infarction ([place]) 07/10/2013  ? Bilateral posterior capsular opacification 05/19/2012  ? Branch retinal vein occlusion 02/08/2012  ? Cystoid macular edema 02/08/2012  ? Macular degeneration 02/08/2012  ? Headache(784.0) 03/15/2011  ? HYPOGLYCEMIA 10/24/2010  ? BACK PAIN, LUMBAR 03/06/2010  ? CONSTIPATION, CHRONIC 09/20/2009  ? SINUSITIS, CHRONIC 08/16/2009  ? UNSTEADY GAIT  07/28/2009  ? INSOMNIA, CHRONIC 07/19/2009  ? THROMBOCYTOPENIA 02/09/2009  ? LEG PAIN, RIGHT 02/01/2009  ? Other abnormal glucose 02/01/2009  ? CHEST PAIN, ATYPICAL 09/02/2008  ? GASTRITIS, HX OF 08/25/2008  ? HYPOTHYROIDISM 07/22/2008  ? DIVERTICULOSIS-COLON 06/09/2008  ? Sciatica of left side 05/06/2008  ? VENOUS INSUFFICIENCY 05/03/2008  ? GERD 05/03/2008  ? NECK PAIN 02/03/2008  ? NUMBNESS 01/29/2008  ? PULMONARY NODULE 12/26/2007  ? Hyperlipidemia 06/21/2007  ? ANEMIA-NOS 06/21/2007  ? LEUKOPENIA, CHRONIC 06/21/2007  ? ANXIETY 06/21/2007  ? DEPRESSION 06/21/2007  ? HYPERTENSION 06/21/2007  ? CEREBRAL ANEURYSM 06/21/2007  ? ALLERGIC RHINITIS 06/21/2007  ? IBS 06/21/2007  ? Osteoarthrosis, unspecified whether generalized or localized, unspecified site 06/21/2007  ? FIBROMYALGIA 06/21/2007  ? CEREBROVASCULAR ACCIDENT, HX OF 06/21/2007  ? ?Past Medical History:  ?Diagnosis Date  ? ALLERGIC RHINITIS 06/21/2007  ? Qualifier: Diagnosis of  By: Wynona Luna   ? Allergy   ? allergic rhinitis  ? Anemia   ? nos  ? ANEMIA-NOS 06/21/2007  ? Qualifier: Diagnosis of  By: Wynona Luna   ? ANXIETY 06/21/2007  ? Qualifier: Diagnosis of  By: Wynona Luna   ? Arthritis   ? osteoarthritis  ? Atypical chest pain 09/2008  ? negative myoview  ? BACK PAIN, LUMBAR 03/06/2010  ? Qualifier: Diagnosis of  By: Wynona Luna   ? CEREBRAL ANEURYSM 06/21/2007  ? Qualifier: History of  By: Wynona Luna   ? CEREBROVASCULAR ACCIDENT, HX OF 06/21/2007  ? Qualifier: Diagnosis of  By: Wynona Luna   ? CHEST PAIN, ATYPICAL 09/02/2008  ? Qualifier: Diagnosis of  By: Wynona Luna   ? Chronic sinusitis   ? CONSTIPATION, CHRONIC 09/20/2009  ? Qualifier: Diagnosis of  By: Olevia Perches MD, Lowella Bandy   ? Depression   ? DEPRESSION 06/21/2007  ? Qualifier: Diagnosis of  By: Wynona Luna   ? Diverticulosis   ? DIVERTICULOSIS-COLON 06/09/2008  ? Qualifier: Diagnosis of  By: Chester Holstein NP, Nevin Bloodgood    ? Fibromyalgia   ? FIBROMYALGIA 06/21/2007  ?  Qualifier: Diagnosis of  By: Wynona Luna   ? GASTRITIS, HX OF 08/25/2008  ? Qualifier: Diagnosis of  By: Nelson-Smith CMA (AAMA), Dottie    ? GERD 05/03/2008  ? Qualifier: Diagnosis of  By: Julien Girt CMA, Leigh    ? GERD (gastroesophageal reflux disease)   ? Headache 03/15/2011  ? History of solitary pulmonary nodule   ? right, stable by CT  ? Hyperlipidemia   ? HYPERLIPIDEMIA 06/21/2007  ? Qualifier: Diagnosis of  By: Wynona Luna   ? Hypertension   ? HYPERTENSION 06/21/2007  ? Qualifier: Diagnosis of  By: Wynona Luna   ? HYPOGLYCEMIA 10/24/2010  ? Qualifier: Diagnosis of  By: Wynona Luna   ? HYPOTHYROIDISM 07/22/2008  ? Qualifier: Diagnosis of  By: Wynona Luna   ? IBS 06/21/2007  ? Qualifier: Diagnosis of  By: Wynona Luna   ?  IBS (irritable bowel syndrome)   ? INSOMNIA, CHRONIC 07/19/2009  ? Qualifier: Diagnosis of  By: Wynona Luna   ? LEG PAIN, RIGHT 02/01/2009  ? Qualifier: Diagnosis of  By: Wynona Luna   ? Leukopenia   ? chronic  ? LEUKOPENIA, CHRONIC 06/21/2007  ? Qualifier: Diagnosis of  By: Wynona Luna   ? NECK PAIN 02/03/2008  ? Qualifier: Diagnosis of  By: Wynona Luna   ? NUMBNESS 01/29/2008  ? Qualifier: Diagnosis of  By: Loanne Drilling MD, Jacelyn Pi   ? Osteoarth NOS-Unspec 06/21/2007  ? Centricity Description: DEGENERATIVE JOINT DISEASE, MILD Qualifier: Diagnosis of  By: Wynona Luna  Centricity Description: OSTEOARTHRITIS Qualifier: Diagnosis of  By: Wynona Luna   ? PULMONARY NODULE 12/26/2007  ? Qualifier: Diagnosis of  By: Wynona Luna   ? Routine general medical examination at a health care facility   ? SCIATICA, RIGHT 05/06/2008  ? Qualifier: Diagnosis of  By: Nathaneil Canary, CMA, Sarah    ? Stroke [facility])   ? THROMBOCYTOPENIA 02/09/2009  ? Qualifier: Diagnosis of  By: Wynona Luna   ? UNSTEADY GAIT 07/28/2009  ? Qualifier: Diagnosis of  By: Wynona Luna   ? VENOUS INSUFFICIENCY 05/03/2008  ? Qualifier: Diagnosis of  By: Julien Girt CMA, Leigh    ?  ?Family  History  ?Problem Relation Age of Onset  ? Cancer Mother   ?     pancreatic  ? Coronary artery disease Father   ? Cancer Sister   ?      breast  ? Asthma Sister   ? Breast cancer Sister   ? Cancer Brother   ?

## 2022-03-16 NOTE — Telephone Encounter (Signed)
Noted  

## 2022-03-16 NOTE — Telephone Encounter (Signed)
Patient called. She would like Tramadol called in for her pain.  ?

## 2022-03-16 NOTE — Telephone Encounter (Signed)
Please precert for left knee gel approval. Xu's patient. ?

## 2022-03-17 MED ORDER — METHYLPREDNISOLONE ACETATE 40 MG/ML IJ SUSP
40.0000 mg | INTRAMUSCULAR | Status: AC | PRN
  Administered 2022-03-16: 40 mg via INTRA_ARTICULAR

## 2022-03-17 MED ORDER — LIDOCAINE HCL 1 % IJ SOLN
2.0000 mL | INTRAMUSCULAR | Status: AC | PRN
  Administered 2022-03-16: 2 mL

## 2022-03-17 MED ORDER — BUPIVACAINE HCL 0.25 % IJ SOLN
2.0000 mL | INTRAMUSCULAR | Status: AC | PRN
  Administered 2022-03-16: 2 mL via INTRA_ARTICULAR

## 2022-03-19 ENCOUNTER — Other Ambulatory Visit: Payer: Self-pay | Admitting: Physician Assistant

## 2022-03-19 MED ORDER — TRAMADOL HCL 50 MG PO TABS: 50.0000 mg | ORAL_TABLET | Freq: Two times a day (BID) | ORAL | 0 refills | Status: DC | PRN

## 2022-03-19 NOTE — Telephone Encounter (Signed)
Sent in

## 2022-03-26 ENCOUNTER — Other Ambulatory Visit: Payer: Self-pay | Admitting: Internal Medicine

## 2022-04-06 ENCOUNTER — Telehealth: Payer: Self-pay | Admitting: Orthopaedic Surgery

## 2022-04-06 NOTE — Telephone Encounter (Signed)
Patient called asked if she can be worked into Dr. Phoebe Sharps schedule? Patient said both os her legs and feet are so swollen she can not take it anymore. Patient said the swelling started 2 weeks ago. The number to contact patient is [phone]  ?

## 2022-04-09 ENCOUNTER — Other Ambulatory Visit: Payer: Self-pay | Admitting: Internal Medicine

## 2022-04-09 NOTE — Telephone Encounter (Signed)
Called patient and [facility] for patient.  ?

## 2022-04-09 NOTE — Telephone Encounter (Signed)
Sounds like a medical issue.  She should probably see her PCP instead.

## 2022-05-16 ENCOUNTER — Other Ambulatory Visit: Payer: Self-pay | Admitting: Internal Medicine

## 2022-06-01 ENCOUNTER — Other Ambulatory Visit: Payer: Self-pay

## 2022-06-01 MED ORDER — EZETIMIBE 10 MG PO TABS: ORAL_TABLET | ORAL | 0 refills | Status: DC

## 2022-07-03 NOTE — Progress Notes (Unsigned)
Cardiology Office Note:    Date:  07/04/2022   ID:  [name], DOB 1931-10-05, MRN [id number]  PCP:  Elisabeth Cara, PA-C  [facility] Cardiologist:  Rudean Haskell MD [place] Electrophysiologist:  None   CC: Follow up PAD, HTN, HLD  History of Present Illness:    Angela Wang is a 88 y.o. female with a hx of HTN, prior cerebral aneurysm 06/21/2007 notes), PAD (seen by podiatry with ABIs ~1.33 both legs in 2019), who presented for palpitations 10/04/20.  2022:  In interval; had moderate pulmonic insufficiency without RV dilation or dysfunction. 2023: Echo showed only mild PR  Patient notes that she is doing badly three to five months ago. Went to urgent care and got a knee injection. Started to swell in both legs.    Since last visit notes that she is seeing [facility] vascular surgery.  Tomorrow she has a bilateral venous duplex  There are no interval hospital/ED visit.    No chest pain or pressure .  No SOB/DOE and no PND/Orthopnea.  She has notable painful leg swelling.   Past Medical History:  Diagnosis Date   ALLERGIC RHINITIS 06/21/2007   Qualifier: Diagnosis of  By: Angela Wang    Allergy    allergic rhinitis   Anemia    nos   ANEMIA-NOS 06/21/2007   Qualifier: Diagnosis of  By: Angela Wang    ANXIETY 06/21/2007   Qualifier: Diagnosis of  By: Angela Wang    Arthritis    osteoarthritis   Atypical chest pain 09/2008   negative myoview   BACK PAIN, LUMBAR 03/06/2010   Qualifier: Diagnosis of  By: Angela Wang    CEREBRAL ANEURYSM 06/21/2007   Qualifier: History of  By: Yoo DO, [place], HX OF 06/21/2007   Qualifier: Diagnosis of  By: Angela Wang    CHEST PAIN, ATYPICAL 09/02/2008   Qualifier: Diagnosis of  By: Angela Wang    Chronic sinusitis    CONSTIPATION, CHRONIC 09/20/2009   Qualifier: Diagnosis of  By: Olevia Perches MD, Lowella Bandy    Depression    DEPRESSION 06/21/2007   Qualifier:  Diagnosis of  By: Angela Wang    Diverticulosis    DIVERTICULOSIS-COLON 06/09/2008   Qualifier: Diagnosis of  By: Chester Holstein NP, Nevin Bloodgood     Fibromyalgia    FIBROMYALGIA 06/21/2007   Qualifier: Diagnosis of  By: Berneta Sages, HX OF 08/25/2008   Qualifier: Diagnosis of  By: Nelson-Smith CMA (AAMA), Dottie     GERD 05/03/2008   Qualifier: Diagnosis of  By: Julien Girt CMA, Leigh     GERD (gastroesophageal reflux disease)    Headache 03/15/2011   History of solitary pulmonary nodule    right, stable by CT   Hyperlipidemia    HYPERLIPIDEMIA 06/21/2007   Qualifier: Diagnosis of  By: Angela Wang    Hypertension    HYPERTENSION 06/21/2007   Qualifier: Diagnosis of  By: Angela Wang    HYPOGLYCEMIA 10/24/2010   Qualifier: Diagnosis of  By: Angela Wang    HYPOTHYROIDISM 07/22/2008   Qualifier: Diagnosis of  By: Angela Wang    IBS 06/21/2007   Qualifier: Diagnosis of  By: Angela Wang    IBS (irritable bowel syndrome)    INSOMNIA, CHRONIC 07/19/2009   Qualifier: Diagnosis of  By:  Yoo DO, [place], RIGHT 02/01/2009   Qualifier: Diagnosis of  By: Angela Wang    Leukopenia    chronic   LEUKOPENIA, CHRONIC 06/21/2007   Qualifier: Diagnosis of  By: Angela Wang    NECK PAIN 02/03/2008   Qualifier: Diagnosis of  By: Angela Wang    NUMBNESS 01/29/2008   Qualifier: Diagnosis of  By: Loanne Drilling MD, Derek Jack NOS-Unspec 06/21/2007   Centricity Description: DEGENERATIVE JOINT DISEASE, MILD Qualifier: Diagnosis of  By: Angela Wang  Centricity Description: OSTEOARTHRITIS Qualifier: Diagnosis of  By: Angela Wang    PULMONARY NODULE 12/26/2007   Qualifier: Diagnosis of  By: Angela Wang    Routine general medical examination at a health care facility    SCIATICA, RIGHT 05/06/2008   Qualifier: Diagnosis of  By: Fredrich Birks, Sarah     Stroke [facility])    THROMBOCYTOPENIA 02/09/2009   Qualifier: Diagnosis of  By: Angela Wang    UNSTEADY GAIT 07/28/2009   Qualifier: Diagnosis of  By: Angela Wang    VENOUS INSUFFICIENCY 05/03/2008   Qualifier: Diagnosis of  By: Julien Girt CMA, Marliss Czar      Past Surgical History:  Procedure Laterality Date   ABDOMINAL HYSTERECTOMY     ANEURYSM COILING     APPENDECTOMY     CHOLECYSTECTOMY     JOINT REPLACEMENT     total right knee replacement   KNEE SURGERY      Current Medications: Current Meds  Medication Sig   ALPRAZolam (XANAX) 0.5 MG tablet Take 0.5 mg by mouth 3 (three) times daily as needed for sleep or anxiety.   amoxicillin-clavulanate (AUGMENTIN) 875-125 MG tablet Take 1 tablet by mouth 2 (two) times daily. One po bid x 7 days   B Complex Vitamins (B COMPLEX 100 PO) Take 1 tablet by mouth daily.   Calcium-Magnesium-Vitamin D 440-347-425 MG-MG-UNIT TABS Take 1 tablet by mouth daily.   clopidogrel (PLAVIX) 75 MG tablet Take 1 tablet (75 mg total) by mouth daily.   co-enzyme Q-10 30 MG capsule Take 30 mg by mouth 3 (three) times daily.   cyclobenzaprine (FLEXERIL) 10 MG tablet Take 1 tablet (10 mg total) by mouth 2 (two) times daily as needed for muscle spasms.   diclofenac Sodium (VOLTAREN) 1 % GEL Apply 4 g topically 4 (four) times daily.   fluticasone (FLONASE) 50 MCG/ACT nasal spray Place 2 sprays into both nostrils as needed for allergies or rhinitis.   folic acid (FOLVITE) 1 MG tablet Take 1 mg by mouth daily.   furosemide (LASIX) 20 MG tablet Take 1 tablet (20 mg total) by mouth daily.   ipratropium (ATROVENT) 0.03 % nasal spray as needed for congestion.   iron polysaccharides (NIFEREX) 150 MG capsule Take 150 mg by mouth daily.   lidocaine (XYLOCAINE) 5 % ointment Apply 1 application topically as directed.   linaclotide (LINZESS) 72 MCG capsule Take 1 capsule (72 mcg total) by mouth daily before breakfast.   mupirocin ointment (BACTROBAN) 2 % Apply 1 application topically 2 (two) times daily.   Olopatadine HCl 0.2 % SOLN Apply 1 drop to eye daily.    omeprazole (PRILOSEC) 40 MG capsule TAKE ONE CAPSULE IN THE MORNING, 30-60 MINUTES BEFORE BREAKFAST.   promethazine-dextromethorphan (PROMETHAZINE-DM) 6.25-15 MG/5ML syrup Take 5 mLs by mouth as directed.   traMADol (ULTRAM) 50 MG tablet Take 1 tablet (50  mg total) by mouth every 12 (twelve) hours as needed.   triamcinolone cream (KENALOG) 0.1 % MIX WITH CETAPHIL MOISTURIZING CREAM. APPLY THREE TIMES DAILY AS NEEDED   vitamin C (ASCORBIC ACID) 500 MG tablet Take 500 mg by mouth.   [DISCONTINUED] ezetimibe (ZETIA) 10 MG tablet TAKE ONE TABLET BY MOUTH DAILY. MUST MAKE APPOINTMENT FOR FURTHER REFILLS     Allergies:   Cyclobenzaprine, Amitriptyline hcl, Aspirin, Citalopram, Escitalopram oxalate, Escitalopram oxalate, Furosemide, Gabapentin, Hydrocodone, Hydrocodone-acetaminophen, Lorazepam, Other, Oxycodone, Oxycodone-acetaminophen, Terbinafine, Terfenadine, Tramadol, Codeine, Lactulose, Latex, Levofloxacin, Moxifloxacin, Pantoprazole, Polyethylene glycol 3350, and Rabeprazole   Social History   Socioeconomic History   Marital status: Widowed    Spouse name: Not on file   Number of children: 4   Years of education: Not on file   Highest education level: Not on file  Occupational History   Occupation: retired    Fish farm manager: RETIRED    Comment: tobacco company  Tobacco Use   Smoking status: Never   Smokeless tobacco: Never  Substance and Sexual Activity   Alcohol use: No   Drug use: No   Sexual activity: Not on file  Other Topics Concern   Not on file  Social History Narrative   Not on file   Social Determinants of Health   Financial Resource Strain: Not on file  Food Insecurity: Not on file  Transportation Needs: Not on file  Physical Activity: Not on file  Stress: Not on file  Social Connections: Not on file    Family History: The patient's family history includes Allergies in her brother; Asthma in her sister; Breast cancer in her sister; Cancer in her brother, maternal aunt,  mother, sister, and another family member; Coronary artery disease in her father; Diabetes in an other family member. History of coronary artery disease notable for son (two heart attacks, and father). History of heart failure notable for no members. History of arrhythmia notable for no members.  ROS:   Please see the history of present illness.    All other systems reviewed and are negative.  EKGs/Labs/Other Studies Reviewed:    The following studies were reviewed today:  EKG:   07/04/22: SR rate 76 10/04/2020 SR rate 87, Low voltage (percordal leads)  10/12/2019- Sinus 74 No ST/T changes    ECHO COMPLETE WO IMAGING ENHANCING AGENT 12/26/2021 1. Left ventricular ejection fraction, by estimation, is 60 to 65%. The left ventricle has normal function. The left ventricle has no regional wall motion abnormalities. There is moderate asymmetric left ventricular hypertrophy of the basal-septal segment. Left ventricular diastolic parameters are indeterminate. 2. Right ventricular systolic function is normal. The right ventricular size is normal. There is normal pulmonary artery systolic pressure. 3. Left atrial size was mildly dilated. 4. The mitral valve is normal in structure. No evidence of mitral valve regurgitation. No evidence of mitral stenosis. 5. The aortic valve is tricuspid. There is mild calcification of the aortic valve. Aortic valve regurgitation is not visualized. No aortic stenosis is present.  Comparison(s): Changes from prior study are noted. PR mild on current study.  Conclusion(s)/Recommendation(s): Otherwise normal echocardiogram, with minor abnormalities described in the report.     Recent Labs: No results found for requested labs within last 365 days.  Recent Lipid Panel    Component Value Date/Time   CHOL 186 03/27/2021 0753   TRIG 76 03/27/2021 0753   HDL 70 03/27/2021 0753   CHOLHDL 2.7 03/27/2021 0753   CHOLHDL 2 06/29/2013 1024   VLDL 10.2 06/29/2013  1024    LDLCALC 102 (H) 03/27/2021 0753     Physical Exam:    VS:  BP (!) 140/80   Pulse 76   Ht '5\' 5"'$  (1.651 m)   Wt 207 lb (93.9 kg)   SpO2 96%   BMI 34.45 kg/m     Wt Readings from Last 3 Encounters:  07/04/22 207 lb (93.9 kg)  05/22/21 170 lb (77.1 kg)  12/27/20 190 lb (86.2 kg)    Gen: no distress   Neck: No JVD Cardiac: No Rubs or Gallops, soft systolic murmur, RRR +2 radial pulses Respiratory: Clear to auscultation bilaterally, normal effort, normal  respiratory rate GI: Soft, nontender, non-distended  MS: +1 painful bilateral edema;  moves all extremities Integument: Skin feels warm, shiny skin with hair loss in legs Neuro:  At time of evaluation, alert and oriented to person/place/time/situation  Psych: Normal affect, patient feels poorly   ASSESSMENT:    1. PAD (peripheral artery disease) ([place])   2. Hyperlipidemia, unspecified hyperlipidemia type   3. Essential hypertension   4. Lower extremity edema   5. Myalgia due to statin     PLAN:    LE Edema Multiple drug allergies PAD Hyperlipidemia complicated by statin myalgias (off rosuvastatin) HTN Prior aneurysmal stroke - we have reviewed her 25 drugs allergies at length today; I am concerned that some of this may not be allergies and would like to cautiously re-engage some of these medication clases - she has LE swelling; she is not on compression stockings for leg pain; we will re-trial lasix 20 mg PO daily; BMP and BNP in one to two weeks, may need K - she has had GI upset with ASA, given her PAD (sees [facility] and has duplex imaging tomorrow) we will trial plavix 75 mg PO daily; she would need DAPT prior to intervention - LDL goal < 70, will recheck labs and may consider lipid clinic (potentially for inclisran) - discussed her [place] (full code to barriers to care; she will tells [place] if we are being to aggressive)  Three months me or APP  Time Spent Directly with Patient:   I have spent a  total of 40 minutes with the patient reviewing notes, imaging, EKGs, labs and examining the patient as well as establishing an assessment and plan that was discussed personally with the patient.  > 50% of time was spent in direct patient care and family.     Medication Adjustments/Labs and Tests Ordered: Current medicines are reviewed at length with the patient today.  Concerns regarding medicines are outlined above.  Orders Placed This Encounter  Procedures   Basic metabolic panel   Pro b natriuretic peptide (BNP)   ALT   Lipid panel   EKG 12-Lead   Meds ordered this encounter  Medications   furosemide (LASIX) 20 MG tablet    Sig: Take 1 tablet (20 mg total) by mouth daily.    Dispense:  90 tablet    Refill:  3   ezetimibe (ZETIA) 10 MG tablet    Sig: TAKE ONE TABLET BY MOUTH DAILY. MUST MAKE APPOINTMENT FOR FURTHER REFILLS    Dispense:  90 tablet    Refill:  3    Pt must keep upcoming appt for future refills thank you   clopidogrel (PLAVIX) 75 MG tablet    Sig: Take 1 tablet (75 mg total) by mouth daily.    Dispense:  90 tablet    Refill:  3  Patient Instructions  Medication Instructions:  Your physician has recommended you make the following change in your medication:  START: furosemide (Lasix) 20 mg by mouth once daily START: clopidogrel (Plavix) 75 mg by mouth once daily  REFILLED: ezetimibe (Zetia) 10 mg   *If you need a refill on your cardiac medications before your next appointment, please call your pharmacy*   Lab Work: IN 1-2 WEEKS: BNP, BMP, FLP, ALT (nothing to eat 8-12 hours before except water and black coffee)  If you have labs (blood work) drawn today and your tests are completely normal, you will receive your results only by: [place] (if you have MyChart) OR A paper copy in the mail If you have any lab test that is abnormal or we need to change your treatment, we will call you to review the  results.   Testing/Procedures: NONE   Follow-Up: At [facility], you and your health needs are our priority.  As part of our continuing mission to provide you with exceptional heart care, we have created designated Provider Care Teams.  These Care Teams include your primary Cardiologist (physician) and Advanced Practice Providers (APPs -  Physician Assistants and Nurse Practitioners) who all work together to provide you with the care you need, when you need it.  Your next appointment:   4-5 month(s)  The format for your next appointment:   In Person  Provider:   Werner Lean, MD  or Melina Copa, PA-C, Ermalinda Barrios, PA-C, or Christen Bame, NP       Important Information About Sugar         Signed, Werner Lean, MD  07/04/2022 11:14 AM    [place]

## 2022-07-04 ENCOUNTER — Ambulatory Visit: Payer: Medicare Other | Admitting: Internal Medicine

## 2022-07-04 ENCOUNTER — Encounter: Payer: Self-pay | Admitting: Internal Medicine

## 2022-07-04 VITALS — BP 140/80 | HR 76 | Ht 65.0 in | Wt 207.0 lb

## 2022-07-04 DIAGNOSIS — I739 Peripheral vascular disease, unspecified: Secondary | ICD-10-CM | POA: Diagnosis not present

## 2022-07-04 DIAGNOSIS — E785 Hyperlipidemia, unspecified: Secondary | ICD-10-CM

## 2022-07-04 DIAGNOSIS — T466X5A Adverse effect of antihyperlipidemic and antiarteriosclerotic drugs, initial encounter: Secondary | ICD-10-CM

## 2022-07-04 DIAGNOSIS — I10 Essential (primary) hypertension: Secondary | ICD-10-CM | POA: Diagnosis not present

## 2022-07-04 DIAGNOSIS — R600 Localized edema: Secondary | ICD-10-CM | POA: Diagnosis not present

## 2022-07-04 DIAGNOSIS — M791 Myalgia, unspecified site: Secondary | ICD-10-CM

## 2022-07-04 MED ORDER — EZETIMIBE 10 MG PO TABS: ORAL_TABLET | ORAL | 3 refills | Status: DC

## 2022-07-04 MED ORDER — FUROSEMIDE 20 MG PO TABS: 20.0000 mg | ORAL_TABLET | Freq: Every day | ORAL | 3 refills | Status: DC

## 2022-07-04 MED ORDER — CLOPIDOGREL BISULFATE 75 MG PO TABS: 75.0000 mg | ORAL_TABLET | Freq: Every day | ORAL | 3 refills | Status: DC

## 2022-07-04 NOTE — Patient Instructions (Addendum)
Medication Instructions:  Your physician has recommended you make the following change in your medication:  START: furosemide (Lasix) 20 mg by mouth once daily START: clopidogrel (Plavix) 75 mg by mouth once daily  REFILLED: ezetimibe (Zetia) 10 mg   *If you need a refill on your cardiac medications before your next appointment, please call your pharmacy*   Lab Work: IN 1-2 WEEKS: BNP, BMP, FLP, ALT (nothing to eat 8-12 hours before except water and black coffee)  If you have labs (blood work) drawn today and your tests are completely normal, you will receive your results only by: [place] (if you have MyChart) OR A paper copy in the mail If you have any lab test that is abnormal or we need to change your treatment, we will call you to review the results.   Testing/Procedures: NONE   Follow-Up: At [facility], you and your health needs are our priority.  As part of our continuing mission to provide you with exceptional heart care, we have created designated Provider Care Teams.  These Care Teams include your primary Cardiologist (physician) and Advanced Practice Providers (APPs -  Physician Assistants and Nurse Practitioners) who all work together to provide you with the care you need, when you need it.  Your next appointment:   4-5 month(s)  The format for your next appointment:   In Person  Provider:   Werner Lean, MD  or Melina Copa, PA-C, Ermalinda Barrios, PA-C, or Christen Bame, NP       Important Information About Sugar

## 2022-07-17 ENCOUNTER — Other Ambulatory Visit: Payer: Medicare Other

## 2022-07-17 DIAGNOSIS — E785 Hyperlipidemia, unspecified: Secondary | ICD-10-CM

## 2022-07-17 DIAGNOSIS — I10 Essential (primary) hypertension: Secondary | ICD-10-CM

## 2022-07-17 DIAGNOSIS — I739 Peripheral vascular disease, unspecified: Secondary | ICD-10-CM

## 2022-07-18 LAB — BASIC METABOLIC PANEL
BUN/Creatinine Ratio: 21 (ref 12–28)
BUN: 18 mg/dL (ref 8–27)
CO2: 25 mmol/L (ref 20–29)
Calcium: 9.4 mg/dL (ref 8.7–10.3)
Chloride: 101 mmol/L (ref 96–106)
Creatinine, Ser: 0.86 mg/dL (ref 0.57–1.00)
Glucose: 91 mg/dL (ref 70–99)
Potassium: 4.7 mmol/L (ref 3.5–5.2)
Sodium: 140 mmol/L (ref 134–144)
eGFR: 65 mL/min/{1.73_m2} (ref >59)

## 2022-07-18 LAB — PRO B NATRIURETIC PEPTIDE: NT-Pro BNP: 92 pg/mL (ref 0–738)

## 2022-07-18 LAB — LIPID PANEL
Chol/HDL Ratio: 2.2 ratio (ref 0.0–4.4)
Cholesterol, Total: 179 mg/dL (ref 100–199)
HDL: 80 mg/dL (ref >39)
LDL Chol Calc (NIH): 89 mg/dL (ref 0–99)
Triglycerides: 53 mg/dL (ref 0–149)
VLDL Cholesterol Cal: 10 mg/dL (ref 5–40)

## 2022-07-18 LAB — ALT: ALT: 9 IU/L (ref 0–32)

## 2022-07-26 ENCOUNTER — Telehealth: Payer: Self-pay | Admitting: Internal Medicine

## 2022-07-26 DIAGNOSIS — E785 Hyperlipidemia, unspecified: Secondary | ICD-10-CM

## 2022-07-26 DIAGNOSIS — I739 Peripheral vascular disease, unspecified: Secondary | ICD-10-CM

## 2022-07-26 NOTE — Telephone Encounter (Signed)
Pt c/o medication issue:  1. Name of Medication: clopidogrel (PLAVIX) 75 MG tablet  2. How are you currently taking this medication (dosage and times per day)? Take 1 tablet (75 mg total) by mouth daily.  3. Are you having a reaction (difficulty breathing--STAT)? no  4. What is your medication issue? Is causing her problems. She states her stomach is upset and unable to walk because her legs is so tight. Please advise

## 2022-07-27 NOTE — Telephone Encounter (Signed)
Referral placed for Lipid clinic.

## 2022-07-27 NOTE — Telephone Encounter (Signed)
Pt reports since starting Plavix has experienced ongoing nausea and bloating.  Last took Plavix 2 days ago and since stopping medication feels a lot better.  Also, reports leg and feet swelling.  Reports knees feel hard. Denies increased sodium intake denies eating high sodium foods.  Elevates legs throughout the day.  Does not wear compression socks hurts to put them on.  Takes furosemide 20 mg PO QD as ordered.  Advised pt to weigh self daily in the A.M.keep a log if gains more than 2-3 lbs overnight or 5 lbs in 1 week to call our office. Pt reports has an OV with PCP on 07/31/22.   Advised pt will send message to MD to review.  Let pt know that it maybe Tuesday before gets a reply from MD.

## 2022-09-06 ENCOUNTER — Ambulatory Visit: Payer: Medicare Other | Attending: Cardiology | Admitting: Pharmacist

## 2022-09-06 DIAGNOSIS — I739 Peripheral vascular disease, unspecified: Secondary | ICD-10-CM

## 2022-09-06 DIAGNOSIS — E785 Hyperlipidemia, unspecified: Secondary | ICD-10-CM

## 2022-09-06 MED ORDER — PRAVASTATIN SODIUM 20 MG PO TABS: 20.0000 mg | ORAL_TABLET | Freq: Every evening | ORAL | 3 refills | Status: DC

## 2022-09-06 NOTE — Assessment & Plan Note (Signed)
Assessment:  Intolerance to rosuvastatin atypical  Tolerating ezetimibe fine  Reviewed importance of cholesterol control to reduce risk of MI, stroke or worsening PAD  Reviewed that majority of cholesterol is made by the body itself  Discussed how aggressive in risk reduction patient would like to be at almost 90  Discussed treatment options including retrial of statin vs PCKS9i vs Nexlizet  Leqvio would not be affordable  Plan:  Start pravastatin '20mg'$  daily  Continue ezetimibe '10mg'$  daily  Repeat lipid labs in Dec at appointment with Dr. Gasper Sells. Direct LDL-C ordered incase patient isn't fasting.

## 2022-09-06 NOTE — Patient Instructions (Addendum)
Please start taking pravastatin '20mg'$  daily Continue taking ezetimibe '10mg'$  daily Please call me at [phone] with any issues  Follow up labs on 12/13 at appointment with Dr. Gasper Sells

## 2022-09-06 NOTE — Progress Notes (Signed)
Patient ID: Angela Wang                 DOB: 06-02-1931                    MRN: [id number]      HPI: Angela Wang is a 88 y.o. female patient referred to lipid clinic by Dr. Gasper Sells. PMH is significant for HTN, prior cerebral aneurysm 06/21/2007 notes) and PAD (seen by podiatry with ABIs ~1.33 both legs in 2019).    Patient started on rosuvastatin previously but did not tolerate. Currently on ezetimibe. LDL-C 89 on last lipid panel.   Patient presents today to lipid clinic accompanied by her daughter. Patient saw vascular doctor yesterday who started her on pentoxifylline '400mg'$  three times a day. She states that one time after she took rosuvastatin she was walking to the car from dinner and about passed out. Says she cant take it. No issues with ezetimibe. Doesn't recall any other cholesterol medications. Has [employer] medicare advantage plan.  Current Medications: ezetimibe '10mg'$  daily Intolerances: rosuvastatin '10mg'$  daily (about passed out per patient) Risk Factors: cerebral aneurysm, PAD LDL goal: <70  Diet: reviewed foods high in saturated fat to avoid  Exercise: very limited due to knee pain. Was encouraged to get up every 30 min -1 hour and walk for a few min.  Family History: The patient's family history includes Allergies in her brother; Asthma in her sister; Breast cancer in her sister; Cancer in her brother, maternal aunt, mother, sister, and another family member; Coronary artery disease in her father; Diabetes in an other family member. History of coronary artery disease notable for son (two heart attacks, and father). History of heart failure notable for no members. History of arrhythmia notable for no members.  Social History:  Social History   Socioeconomic History   Marital status: Widowed    Spouse name: Not on file   Number of children: 4   Years of education: Not on file   Highest education level: Not on file  Occupational History   Occupation: retired     Fish farm manager: RETIRED    Comment: [employer]  Tobacco Use   Smoking status: Never   Smokeless tobacco: Never  Substance and Sexual Activity   Alcohol use: No   Drug use: No   Sexual activity: Not on file  Other Topics Concern   Not on file  Social History Narrative   Not on file   Social Determinants of Health   Financial Resource Strain: Not on file  Food Insecurity: Not on file  Transportation Needs: Not on file  Physical Activity: Not on file  Stress: Not on file  Social Connections: Not on file  Intimate Partner Violence: Not on file     Labs:07/17/22 TC 179, TG 53, HDL 80, LDL-C 89 (ezetimibe '10mg'$  daily)  Past Medical History:  Diagnosis Date   ALLERGIC RHINITIS 06/21/2007   Qualifier: Diagnosis of  By: Wynona Luna    Allergy    allergic rhinitis   Anemia    nos   ANEMIA-NOS 06/21/2007   Qualifier: Diagnosis of  By: Wynona Luna    ANXIETY 06/21/2007   Qualifier: Diagnosis of  By: Wynona Luna    Arthritis    osteoarthritis   Atypical chest pain 09/2008   negative myoview   BACK PAIN, LUMBAR 03/06/2010   Qualifier: Diagnosis of  By: Wynona Luna    CEREBRAL ANEURYSM  06/21/2007   Qualifier: History of  By: Yoo DO, [place], HX OF 06/21/2007   Qualifier: Diagnosis of  By: Wynona Luna    CHEST PAIN, ATYPICAL 09/02/2008   Qualifier: Diagnosis of  By: Wynona Luna    Chronic sinusitis    CONSTIPATION, CHRONIC 09/20/2009   Qualifier: Diagnosis of  By: Olevia Perches MD, Lowella Bandy    Depression    DEPRESSION 06/21/2007   Qualifier: Diagnosis of  By: Wynona Luna    Diverticulosis    DIVERTICULOSIS-COLON 06/09/2008   Qualifier: Diagnosis of  By: Chester Holstein NP, Nevin Bloodgood     Fibromyalgia    FIBROMYALGIA 06/21/2007   Qualifier: Diagnosis of  By: Berneta Sages, HX OF 08/25/2008   Qualifier: Diagnosis of  By: Nelson-Smith CMA (AAMA), Dottie     GERD 05/03/2008   Qualifier: Diagnosis of  By: Julien Girt CMA,  Leigh     GERD (gastroesophageal reflux disease)    Headache 03/15/2011   History of solitary pulmonary nodule    right, stable by CT   Hyperlipidemia    HYPERLIPIDEMIA 06/21/2007   Qualifier: Diagnosis of  By: Wynona Luna    Hypertension    HYPERTENSION 06/21/2007   Qualifier: Diagnosis of  By: Wynona Luna    HYPOGLYCEMIA 10/24/2010   Qualifier: Diagnosis of  By: Wynona Luna    HYPOTHYROIDISM 07/22/2008   Qualifier: Diagnosis of  By: Wynona Luna    IBS 06/21/2007   Qualifier: Diagnosis of  By: Wynona Luna    IBS (irritable bowel syndrome)    INSOMNIA, CHRONIC 07/19/2009   Qualifier: Diagnosis of  By: Wynona Luna    LEG PAIN, RIGHT 02/01/2009   Qualifier: Diagnosis of  By: Wynona Luna    Leukopenia    chronic   LEUKOPENIA, CHRONIC 06/21/2007   Qualifier: Diagnosis of  By: Wynona Luna    NECK PAIN 02/03/2008   Qualifier: Diagnosis of  By: Wynona Luna    NUMBNESS 01/29/2008   Qualifier: Diagnosis of  By: Loanne Drilling MD, Derek Jack NOS-Unspec 06/21/2007   Centricity Description: DEGENERATIVE JOINT DISEASE, MILD Qualifier: Diagnosis of  By: Wynona Luna  Centricity Description: OSTEOARTHRITIS Qualifier: Diagnosis of  By: Wynona Luna    PULMONARY NODULE 12/26/2007   Qualifier: Diagnosis of  By: Wynona Luna    Routine general medical examination at a health care facility    SCIATICA, RIGHT 05/06/2008   Qualifier: Diagnosis of  By: Fredrich Birks, Sarah     Stroke [facility])    THROMBOCYTOPENIA 02/09/2009   Qualifier: Diagnosis of  By: Wynona Luna    UNSTEADY GAIT 07/28/2009   Qualifier: Diagnosis of  By: Wynona Luna    VENOUS INSUFFICIENCY 05/03/2008   Qualifier: Diagnosis of  By: Julien Girt CMA, Leigh      Current Outpatient Medications on File Prior to Visit  Medication Sig Dispense Refill   ALPRAZolam (XANAX) 0.5 MG tablet Take 0.5 mg by mouth 3 (three) times daily as needed for sleep or anxiety.      amoxicillin-clavulanate (AUGMENTIN) 875-125 MG tablet Take 1 tablet by mouth 2 (two) times daily. One po bid x 7 days 14 tablet 0   B Complex Vitamins (B COMPLEX 100 PO) Take 1 tablet by mouth daily.  Calcium-Magnesium-Vitamin D [phone] MG-MG-UNIT TABS Take 1 tablet by mouth daily.     clopidogrel (PLAVIX) 75 MG tablet Take 1 tablet (75 mg total) by mouth daily. 90 tablet 3   co-enzyme Q-10 30 MG capsule Take 30 mg by mouth 3 (three) times daily.     cyclobenzaprine (FLEXERIL) 10 MG tablet Take 1 tablet (10 mg total) by mouth 2 (two) times daily as needed for muscle spasms. 20 tablet 0   diclofenac Sodium (VOLTAREN) 1 % GEL Apply 4 g topically 4 (four) times daily. 100 g 0   ezetimibe (ZETIA) 10 MG tablet TAKE ONE TABLET BY MOUTH DAILY. MUST MAKE APPOINTMENT FOR FURTHER REFILLS 90 tablet 3   fluticasone (FLONASE) 50 MCG/ACT nasal spray Place 2 sprays into both nostrils as needed for allergies or rhinitis.     folic acid (FOLVITE) 1 MG tablet Take 1 mg by mouth daily.     furosemide (LASIX) 20 MG tablet Take 1 tablet (20 mg total) by mouth daily. 90 tablet 3   ipratropium (ATROVENT) 0.03 % nasal spray as needed for congestion.     iron polysaccharides (NIFEREX) 150 MG capsule Take 150 mg by mouth daily.     lidocaine (XYLOCAINE) 5 % ointment Apply 1 application topically as directed.     linaclotide (LINZESS) 72 MCG capsule Take 1 capsule (72 mcg total) by mouth daily before breakfast. 30 capsule 3   mupirocin ointment (BACTROBAN) 2 % Apply 1 application topically 2 (two) times daily. 30 g 2   Olopatadine HCl 0.2 % SOLN Apply 1 drop to eye daily.     omeprazole (PRILOSEC) 40 MG capsule TAKE ONE CAPSULE IN THE MORNING, 30-60 MINUTES BEFORE BREAKFAST. 90 capsule 1   pentoxifylline (TRENTAL) 400 MG CR tablet Take 400 mg by mouth 3 (three) times daily.     promethazine-dextromethorphan (PROMETHAZINE-DM) 6.25-15 MG/5ML syrup Take 5 mLs by mouth as directed.     traMADol (ULTRAM) 50 MG tablet  Take 1 tablet (50 mg total) by mouth every 12 (twelve) hours as needed. 30 tablet 0   triamcinolone cream (KENALOG) 0.1 % MIX WITH CETAPHIL MOISTURIZING CREAM. APPLY THREE TIMES DAILY AS NEEDED     vitamin C (ASCORBIC ACID) 500 MG tablet Take 500 mg by mouth.     No current facility-administered medications on file prior to visit.    Allergies  Allergen Reactions   Cyclobenzaprine Swelling   Amitriptyline Hcl     REACTION: throat swelling   Aspirin Other (See Comments)    REACTION: stomach upset   Citalopram Other (See Comments)   Escitalopram Oxalate     REACTION: Hallucinations   Escitalopram Oxalate     REACTION: Hallucinations   Furosemide Nausea And Vomiting    Abdominal pain   Gabapentin     GI Upset    Hydrocodone Other (See Comments)   Hydrocodone-Acetaminophen     REACTION: stomach upset   Lorazepam Other (See Comments)    REACTION: Throat Swelling   Other Swelling    Other reaction(s): Other (See Comments) ANASTHESIA   Oxycodone Other (See Comments)    Headache, change in mental status   Oxycodone-Acetaminophen     REACTION: Nausea and upset stomach   Terbinafine Other (See Comments)   Terfenadine     REACTION: stomach upset   Tramadol Nausea And Vomiting and Other (See Comments)   Codeine Nausea Only and Other (See Comments)    REACTION: stomach upset REACTION: stomach upset REACTION: stomach upset   Lactulose Other (  See Comments)    Passed out Passed out    Latex Rash    REACTION: rash REACTION: rash REACTION: rash   Levofloxacin Nausea And Vomiting and Nausea Only   Moxifloxacin Nausea And Vomiting and Nausea Only   Pantoprazole Other (See Comments) and Nausea Only   Polyethylene Glycol 3350 Other (See Comments)    Makes her bloated Makes her bloated    Rabeprazole Other (See Comments) and Nausea Only    Assessment/Plan:  1. Hyperlipidemia -   Hyperlipidemia Assessment: Intolerance to rosuvastatin atypical Tolerating ezetimibe  fine Reviewed importance of cholesterol control to reduce risk of MI, stroke or worsening PAD Reviewed that majority of cholesterol is made by the body itself Discussed how aggressive in risk reduction patient would like to be at almost 90 Discussed treatment options including retrial of statin vs PCKS9i vs Nexlizet Leqvio would not be affordable  Plan: Start pravastatin '20mg'$  daily Continue ezetimibe '10mg'$  daily Repeat lipid labs in Dec at appointment with Dr. Gasper Sells. Direct LDL-C ordered incase patient isn't fasting.  Thank you,   Ramond Dial, Pharm.D, BCPS, CPP [facility] of [facility] [address], [place], [place] [address]  Phone: [phone]; Fax: [phone]

## 2022-09-10 ENCOUNTER — Other Ambulatory Visit: Payer: Self-pay | Admitting: Physician Assistant

## 2022-10-02 ENCOUNTER — Ambulatory Visit: Payer: Medicare Other | Admitting: Podiatry

## 2022-10-02 DIAGNOSIS — G609 Hereditary and idiopathic neuropathy, unspecified: Secondary | ICD-10-CM | POA: Diagnosis not present

## 2022-10-02 DIAGNOSIS — M79673 Pain in unspecified foot: Secondary | ICD-10-CM | POA: Diagnosis not present

## 2022-10-02 DIAGNOSIS — G8929 Other chronic pain: Secondary | ICD-10-CM | POA: Diagnosis not present

## 2022-10-05 NOTE — Progress Notes (Signed)
  Subjective:  Patient ID: Angela Wang, female    DOB: [birth year],  MRN: [id number]  Chief Complaint  Patient presents with   Peripheral Neuropathy    pt interested in new treatment for neuropathy( req Dr Sherryle Lis)    88 y.o. female presents with the above complaint. History confirmed with patient.  She was referred to me by her daughter who is a patient of mine.  She has burning aching pain tingling and numbness and tightness in the feet.  She has tried gabapentin and Lyrica but was unable to tolerate these.  She is not diabetic  Objective:  Physical Exam: warm, good capillary refill, no trophic changes or ulcerative lesions, normal DP and PT pulses, and she has an abnormal sensory exam with mixed protective sensation, subjective paresthesias throughout the feet, no other significant clinical exam findings to explain the patient's pain.  Assessment:   1. Idiopathic peripheral neuropathy   2. Chronic foot pain, unspecified laterality      Plan:  Patient was evaluated and treated and all questions answered.  We discussed her longstanding chronic peripheral neuropathy.  We discussed that with most types of neuropathy there is no known cure and likely symptomatic treatment is the mainstay of treatment.  She has had gabapentin and Lyrica without relief.  Unfortunately she would not be a candidate for Qutenza.  I recommended referral to pain management.  Referral was sent.  She will follow-up with me as needed  Return if symptoms worsen or fail to improve.

## 2022-10-10 ENCOUNTER — Encounter: Payer: Self-pay | Admitting: Physical Medicine and Rehabilitation

## 2022-10-10 ENCOUNTER — Telehealth: Payer: Self-pay | Admitting: *Deleted

## 2022-10-10 NOTE — Telephone Encounter (Signed)
Patient is calling for status of a pain management referral to [phone]),fax: [phone]) that was sent,called but could not get thru, gave patient # to contact.

## 2022-10-30 ENCOUNTER — Other Ambulatory Visit: Payer: Self-pay | Admitting: Internal Medicine

## 2022-11-06 NOTE — Progress Notes (Unsigned)
Cardiology Office Note:    Date:  11/07/2022   ID:  Angela Wang, DOB 01/16/1931, MRN [id number]  PCP:  Elisabeth Cara, PA-C  [facility] Cardiologist:  Rudean Haskell MD [place] Electrophysiologist:  None   CC: Follow up PAD, HTN, HLD  History of Present Illness:    Angela Wang is a 88 y.o. female with a hx of HTN, prior cerebral aneurysm 06/21/2007 notes), PAD (seen by podiatry with ABIs ~1.33 both legs in 2019), who presented for palpitations 10/04/20.  2022:  In interval; had moderate pulmonic insufficiency without RV dilation or dysfunction. 2023: Echo showed only mild PR; started pravastatin with plans to follow up today.  Patient notes that she is doing well.   Saw [facility] vascular MD and wants to see someone closer. They decreased diuretic.  She would like to change in someone closer.  No chest pain or pressure .  Rare throat pain when she gets upset. No SOB and DOE and no PND/Orthopnea.  Her LE has improved but is still present.  Wounds are improving.   No palpitations or syncope.   Past Medical History:  Diagnosis Date   ALLERGIC RHINITIS 06/21/2007   Qualifier: Diagnosis of  By: Wynona Luna    Allergy    allergic rhinitis   Anemia    nos   ANEMIA-NOS 06/21/2007   Qualifier: Diagnosis of  By: Wynona Luna    ANXIETY 06/21/2007   Qualifier: Diagnosis of  By: Wynona Luna    Arthritis    osteoarthritis   Atypical chest pain 09/2008   negative myoview   BACK PAIN, LUMBAR 03/06/2010   Qualifier: Diagnosis of  By: Wynona Luna    CEREBRAL ANEURYSM 06/21/2007   Qualifier: History of  By: Yoo DO, Ly[place], HX OF 06/21/2007   Qualifier: Diagnosis of  By: Wynona Luna    CHEST PAIN, ATYPICAL 09/02/2008   Qualifier: Diagnosis of  By: Wynona Luna    Chronic sinusitis    CONSTIPATION, CHRONIC 09/20/2009   Qualifier: Diagnosis of  By: Olevia Perches MD, Lowella Bandy    Depression    DEPRESSION  06/21/2007   Qualifier: Diagnosis of  By: Wynona Luna    Diverticulosis    DIVERTICULOSIS-COLON 06/09/2008   Qualifier: Diagnosis of  By: Chester Holstein NP, Nevin Bloodgood     Fibromyalgia    FIBROMYALGIA 06/21/2007   Qualifier: Diagnosis of  By: Berneta Sages, HX OF 08/25/2008   Qualifier: Diagnosis of  By: Nelson-Smith CMA (AAMA), Dottie     GERD 05/03/2008   Qualifier: Diagnosis of  By: Julien Girt CMA, Leigh     GERD (gastroesophageal reflux disease)    Headache 03/15/2011   History of solitary pulmonary nodule    right, stable by CT   Hyperlipidemia    HYPERLIPIDEMIA 06/21/2007   Qualifier: Diagnosis of  By: Wynona Luna    Hypertension    HYPERTENSION 06/21/2007   Qualifier: Diagnosis of  By: Wynona Luna    HYPOGLYCEMIA 10/24/2010   Qualifier: Diagnosis of  By: Wynona Luna    HYPOTHYROIDISM 07/22/2008   Qualifier: Diagnosis of  By: Wynona Luna    IBS 06/21/2007   Qualifier: Diagnosis of  By: Wynona Luna    IBS (irritable bowel syndrome)    INSOMNIA, CHRONIC 07/19/2009   Qualifier: Diagnosis  of  By: Rod Holler PAIN, RIGHT 02/01/2009   Qualifier: Diagnosis of  By: Wynona Luna    Leukopenia    chronic   LEUKOPENIA, CHRONIC 06/21/2007   Qualifier: Diagnosis of  By: Wynona Luna    NECK PAIN 02/03/2008   Qualifier: Diagnosis of  By: Wynona Luna    NUMBNESS 01/29/2008   Qualifier: Diagnosis of  By: Loanne Drilling MD, Derek Jack NOS-Unspec 06/21/2007   Centricity Description: DEGENERATIVE JOINT DISEASE, MILD Qualifier: Diagnosis of  By: Wynona Luna  Centricity Description: OSTEOARTHRITIS Qualifier: Diagnosis of  By: Wynona Luna    PULMONARY NODULE 12/26/2007   Qualifier: Diagnosis of  By: Wynona Luna    Routine general medical examination at a health care facility    SCIATICA, RIGHT 05/06/2008   Qualifier: Diagnosis of  By: Fredrich Birks, Sarah     Stroke [facility])    THROMBOCYTOPENIA 02/09/2009   Qualifier:  Diagnosis of  By: Wynona Luna    UNSTEADY GAIT 07/28/2009   Qualifier: Diagnosis of  By: Wynona Luna    VENOUS INSUFFICIENCY 05/03/2008   Qualifier: Diagnosis of  By: Julien Girt CMA, Marliss Czar      Past Surgical History:  Procedure Laterality Date   ABDOMINAL HYSTERECTOMY     ANEURYSM COILING     APPENDECTOMY     CHOLECYSTECTOMY     JOINT REPLACEMENT     total right knee replacement   KNEE SURGERY      Current Medications: Current Meds  Medication Sig   ALPRAZolam (XANAX) 0.5 MG tablet Take 0.5 mg by mouth 3 (three) times daily as needed for sleep or anxiety.   B Complex Vitamins (B COMPLEX 100 PO) Take 1 tablet by mouth daily.   bisacodyl (DULCOLAX) 5 MG EC tablet as needed for moderate constipation or severe constipation.   Calcium-Magnesium-Vitamin D [phone] MG-MG-UNIT TABS Take 1 tablet by mouth daily.   co-enzyme Q-10 30 MG capsule Take 30 mg by mouth 3 (three) times daily.   cyclobenzaprine (FLEXERIL) 10 MG tablet Take 1 tablet (10 mg total) by mouth 2 (two) times daily as needed for muscle spasms.   diclofenac Sodium (VOLTAREN) 1 % GEL Apply 4 g topically 4 (four) times daily.   ezetimibe (ZETIA) 10 MG tablet TAKE ONE TABLET BY MOUTH DAILY. MUST MAKE APPOINTMENT FOR FURTHER REFILLS   fluticasone (FLONASE) 50 MCG/ACT nasal spray Place 2 sprays into both nostrils as needed for allergies or rhinitis.   folic acid (FOLVITE) 1 MG tablet Take 1 mg by mouth daily.   furosemide (LASIX) 20 MG tablet Take 1 tablet (20 mg total) by mouth daily. (Patient taking differently: Take 20 mg by mouth 3 (three) times a week.)   ipratropium (ATROVENT) 0.03 % nasal spray as needed for congestion.   iron polysaccharides (NIFEREX) 150 MG capsule Take 150 mg by mouth daily.   lidocaine (XYLOCAINE) 5 % ointment Apply 1 application topically as directed.   linaclotide (LINZESS) 145 MCG CAPS capsule Take 145 mcg by mouth as needed (irritable bowel).   Olopatadine HCl 0.2 % SOLN Apply 1 drop to eye  daily.   omeprazole (PRILOSEC) 40 MG capsule TAKE ONE CAPSULE IN THE MORNING, 30-60 MINUTES BEFORE BREAKFAST.   pravastatin (PRAVACHOL) 20 MG tablet Take 1 tablet (20 mg total) by mouth every evening.   promethazine-dextromethorphan (PROMETHAZINE-DM) 6.25-15 MG/5ML syrup Take 5 mLs by  mouth as directed.   traMADol (ULTRAM) 50 MG tablet Take 1 tablet (50 mg total) by mouth every 12 (twelve) hours as needed.   triamcinolone cream (KENALOG) 0.1 % MIX WITH CETAPHIL MOISTURIZING CREAM. APPLY THREE TIMES DAILY AS NEEDED   vitamin C (ASCORBIC ACID) 500 MG tablet Take 500 mg by mouth.   [DISCONTINUED] amoxicillin-clavulanate (AUGMENTIN) 875-125 MG tablet Take 1 tablet by mouth 2 (two) times daily. One po bid x 7 days   [DISCONTINUED] linaclotide (LINZESS) 72 MCG capsule Take 1 capsule (72 mcg total) by mouth daily before breakfast. (Patient taking differently: Take 145 mcg by mouth daily before breakfast.)   [DISCONTINUED] mupirocin ointment (BACTROBAN) 2 % Apply 1 application topically 2 (two) times daily.   [DISCONTINUED] pentoxifylline (TRENTAL) 400 MG CR tablet Take 400 mg by mouth 3 (three) times daily.     Allergies:   Cyclobenzaprine, Amitriptyline hcl, Aspirin, Citalopram, Escitalopram oxalate, Escitalopram oxalate, Furosemide, Gabapentin, Hydrocodone, Hydrocodone-acetaminophen, Lorazepam, Other, Oxycodone, Oxycodone-acetaminophen, Terbinafine, Terfenadine, Tramadol, Codeine, Lactulose, Latex, Levofloxacin, Moxifloxacin, Pantoprazole, Polyethylene glycol 3350, and Rabeprazole   Social History   Socioeconomic History   Marital status: Widowed    Spouse name: Not on file   Number of children: 4   Years of education: Not on file   Highest education level: Not on file  Occupational History   Occupation: retired    Fish farm manager: RETIRED    Comment: [employer]  Tobacco Use   Smoking status: Never   Smokeless tobacco: Never  Substance and Sexual Activity   Alcohol use: No   Drug use: No    Sexual activity: Not on file  Other Topics Concern   Not on file  Social History Narrative   Not on file   Social Determinants of Health   Financial Resource Strain: Not on file  Food Insecurity: Not on file  Transportation Needs: Not on file  Physical Activity: Not on file  Stress: Not on file  Social Connections: Not on file    Family History: The patient's family history includes Allergies in her brother; Asthma in her sister; Breast cancer in her sister; Cancer in her brother, maternal aunt, mother, sister, and another family member; Coronary artery disease in her father; Diabetes in an other family member. History of coronary artery disease notable for son (two heart attacks, and father). History of heart failure notable for no members. History of arrhythmia notable for no members.  ROS:   Please see the history of present illness.    All other systems reviewed and are negative.  EKGs/Labs/Other Studies Reviewed:    The following studies were reviewed today:  EKG:   07/04/22: SR rate 76 10/04/2020 SR rate 87, Low voltage (percordal leads)  10/12/2019- Sinus 74 No ST/T changes  Cardiac Studies & Procedures       ECHOCARDIOGRAM  ECHOCARDIOGRAM COMPLETE 12/26/2021  Narrative ECHOCARDIOGRAM REPORT    Patient Name:   Angela Wang Date of Exam: 12/26/2021 Medical Rec #:  [id number]     Height:       65.0 in Accession #:    [id number]    Weight:       170.0 lb Date of Birth:  1931-11-01     BSA:          1.846 m Patient Age:    89 years      BP:           131/79 mmHg Patient Gender: F  HR:           75 bpm. Exam Location:  [place]  Procedure: 2D Echo, Cardiac Doppler and Color Doppler  Indications:     I37.1 Pulmonary valve insufficiency  History:         Patient has prior history of Echocardiogram examinations, most recent 11/01/2020. PVD, Signs/Symptoms:Chest Pain; Risk Factors:Hypertension.  Sonographer:     Marygrace Drought RCS Referring Phys:   [id number] [facility] Diagnosing Phys: Buford Dresser MD  IMPRESSIONS   1. Left ventricular ejection fraction, by estimation, is 60 to 65%. The left ventricle has normal function. The left ventricle has no regional wall motion abnormalities. There is moderate asymmetric left ventricular hypertrophy of the basal-septal segment. Left ventricular diastolic parameters are indeterminate. 2. Right ventricular systolic function is normal. The right ventricular size is normal. There is normal pulmonary artery systolic pressure. 3. Left atrial size was mildly dilated. 4. The mitral valve is normal in structure. No evidence of mitral valve regurgitation. No evidence of mitral stenosis. 5. The aortic valve is tricuspid. There is mild calcification of the aortic valve. Aortic valve regurgitation is not visualized. No aortic stenosis is present.  Comparison(s): Changes from prior study are noted. PR mild on current study.  Conclusion(s)/Recommendation(s): Otherwise normal echocardiogram, with minor abnormalities described in the report.  FINDINGS Left Ventricle: Left ventricular ejection fraction, by estimation, is 60 to 65%. The left ventricle has normal function. The left ventricle has no regional wall motion abnormalities. The left ventricular internal cavity size was normal in size. There is moderate asymmetric left ventricular hypertrophy of the basal-septal segment. Left ventricular diastolic parameters are indeterminate.  Right Ventricle: The right ventricular size is normal. No increase in right ventricular wall thickness. Right ventricular systolic function is normal. There is normal pulmonary artery systolic pressure. The tricuspid regurgitant velocity is 2.52 m/s, and with an assumed right atrial pressure of 8 mmHg, the estimated right ventricular systolic pressure is 27.7 mmHg.  Left Atrium: Left atrial size was mildly dilated.  Right Atrium: Right atrial size was normal in  size.  Pericardium: There is no evidence of pericardial effusion.  Mitral Valve: The mitral valve is normal in structure. No evidence of mitral valve regurgitation. No evidence of mitral valve stenosis.  Tricuspid Valve: The tricuspid valve is normal in structure. Tricuspid valve regurgitation is trivial. No evidence of tricuspid stenosis.  Aortic Valve: The aortic valve is tricuspid. There is mild calcification of the aortic valve. Aortic valve regurgitation is not visualized. No aortic stenosis is present.  Pulmonic Valve: The pulmonic valve was grossly normal. Pulmonic valve regurgitation is mild. No evidence of pulmonic stenosis.  Aorta: The aortic root, ascending aorta and aortic arch are all structurally normal, with no evidence of dilitation or obstruction.  Venous: The inferior vena cava was not well visualized.  IAS/Shunts: The atrial septum is grossly normal.   LEFT VENTRICLE PLAX 2D LVIDd:         3.30 cm   Diastology LVIDs:         2.10 cm   LV e' medial:    6.53 cm/s LV PW:         1.10 cm   LV E/e' medial:  8.9 LV IVS:        1.50 cm   LV e' lateral:   3.37 cm/s LVOT diam:     2.00 cm   LV E/e' lateral: 17.3 LV SV:         69 LV  SV Index:   38 LVOT Area:     3.14 cm   RIGHT VENTRICLE RV Basal diam:  3.90 cm RV S prime:     12.70 cm/s RVSP:           28.4 mmHg  LEFT ATRIUM             Index        RIGHT ATRIUM           Index LA diam:        3.00 cm 1.63 cm/m   RA Pressure: 3.00 mmHg LA Vol (A2C):   53.8 ml 29.14 ml/m  RA Area:     8.98 cm LA Vol (A4C):   24.8 ml 13.43 ml/m  RA Volume:   15.30 ml  8.29 ml/m LA Biplane Vol: 39.9 ml 21.61 ml/m AORTIC VALVE LVOT Vmax:   82.20 cm/s LVOT Vmean:  54.800 cm/s LVOT VTI:    0.221 m  AORTA Ao Root diam: 3.10 cm Ao Asc diam:  3.20 cm  MITRAL VALVE                TRICUSPID VALVE MV Area (PHT):              TR Peak grad:   25.4 mmHg MV Decel Time:              TR Vmax:        252.00 cm/s MV E velocity:  58.20 cm/s   Estimated RAP:  3.00 mmHg MV A velocity: 105.00 cm/s  RVSP:           28.4 mmHg MV E/A ratio:  0.55 SHUNTS Systemic VTI:  0.22 m Systemic Diam: 2.00 cm  Buford Dresser MD Electronically signed by Buford Dresser MD Signature Date/Time: 12/26/2021/4:41:28 PM    Final (Updated)             Recent Labs: 07/17/2022: ALT 9; BUN 18; Creatinine, Ser 0.86; NT-Pro BNP 92; Potassium 4.7; Sodium 140  Recent Lipid Panel    Component Value Date/Time   CHOL 179 07/17/2022 0800   TRIG 53 07/17/2022 0800   HDL 80 07/17/2022 0800   CHOLHDL 2.2 07/17/2022 0800   CHOLHDL 2 06/29/2013 1024   VLDL 10.2 06/29/2013 1024   LDLCALC 89 07/17/2022 0800     Physical Exam:    VS:  BP 130/62   Pulse 87   Ht '5\' 5"'$  (1.651 m)   Wt 199 lb 6.4 oz (90.4 kg)   SpO2 96%   BMI 33.18 kg/m     Wt Readings from Last 3 Encounters:  11/07/22 199 lb 6.4 oz (90.4 kg)  07/04/22 207 lb (93.9 kg)  05/22/21 170 lb (77.1 kg)    Gen: no distress   Neck: No JVD Cardiac: No rubs or gallops, soft systolic murmur, RRR +2 radial pulses Respiratory: Clear to auscultation bilaterally, normal effort, normal  respiratory rate GI: Soft, nontender, non-distended  MS: +1 painful bilateral edema;  moves all extremities Integument: Skin feels warm, shiny skin with hair loss in legs Neuro:  At time of evaluation, alert and oriented to person/place/time/situation  Psych: Normal affect, patient feels poorly   ASSESSMENT:    1. Hyperlipidemia, unspecified hyperlipidemia type   2. Essential hypertension   3. PVD (peripheral vascular disease) (HCC)     PLAN:    PAD Hyperlipidemia complicated by statin myalgias (off rosuvastatin) LE Edema Prior aneurysmal stroke - I have concerns for her intermittent plavix use: recommend daily use,  will do CBC in two weeks - she still have LE edema, when taking it daily, she has normaly kidney function; BMP and BNP on two weeks after daily lasix - she has  asked to see a new vascular doctor; will refer to [facility] on her request  HTN - controlled on current therapy  HLD - LDL goal < 70, will gets lipids and ALT at her follow up diuretic labs   Six months with me or APP     Medication Adjustments/Labs and Tests Ordered: Current medicines are reviewed at length with the patient today.  Concerns regarding medicines are outlined above.  Orders Placed This Encounter  Procedures   CBC   Basic metabolic panel   Pro b natriuretic peptide (BNP)   Ambulatory referral to Vascular Surgery   No orders of the defined types were placed in this encounter.   Patient Instructions  Medication Instructions:  Your physician has recommended you make the following change in your medication:   Take lasix daily  Take plavix daily   *If you need a refill on your cardiac medications before your next appointment, please call your pharmacy*   Lab Work: BMET, BNP and CBC 2 weeks If you have labs (blood work) drawn today and your tests are completely normal, you will receive your results only by: [place] (if you have MyChart) OR A paper copy in the mail If you have any lab test that is abnormal or we need to change your treatment, we will call you to review the results.  Follow-Up: At [facility], you and your health needs are our priority.  As part of our continuing mission to provide you with exceptional heart care, we have created designated Provider Care Teams.  These Care Teams include your primary Cardiologist (physician) and Advanced Practice Providers (APPs -  Physician Assistants and Nurse Practitioners) who all work together to provide you with the care you need, when you need it.  We recommend signing up for the patient portal called "MyChart".  Sign up information is provided on this After Visit Summary.  MyChart is used to connect with patients for Virtual Visits (Telemedicine).  Patients are able to view lab/test results,  encounter notes, upcoming appointments, etc.  Non-urgent messages can be sent to your provider as well.   To learn more about what you can do with MyChart, go to [email or web address].    Your next appointment:   6 month(s)  The format for your next appointment:   In Person  Provider:   Werner Lean, MD     Important Information About Sugar         Signed, Werner Lean, MD  11/07/2022 12:03 PM    [place]

## 2022-11-07 ENCOUNTER — Encounter: Payer: Self-pay | Admitting: Internal Medicine

## 2022-11-07 ENCOUNTER — Ambulatory Visit: Payer: Medicare Other | Attending: Internal Medicine | Admitting: Internal Medicine

## 2022-11-07 ENCOUNTER — Ambulatory Visit: Payer: Medicare Other

## 2022-11-07 VITALS — BP 130/62 | HR 87 | Ht 65.0 in | Wt 199.4 lb

## 2022-11-07 DIAGNOSIS — E785 Hyperlipidemia, unspecified: Secondary | ICD-10-CM

## 2022-11-07 DIAGNOSIS — I10 Essential (primary) hypertension: Secondary | ICD-10-CM | POA: Diagnosis not present

## 2022-11-07 DIAGNOSIS — I739 Peripheral vascular disease, unspecified: Secondary | ICD-10-CM

## 2022-11-07 NOTE — Patient Instructions (Signed)
Medication Instructions:  Your physician has recommended you make the following change in your medication:   Take lasix daily  Take plavix daily   *If you need a refill on your cardiac medications before your next appointment, please call your pharmacy*   Lab Work: BMET, BNP and CBC 2 weeks If you have labs (blood work) drawn today and your tests are completely normal, you will receive your results only by: [place] (if you have MyChart) OR A paper copy in the mail If you have any lab test that is abnormal or we need to change your treatment, we will call you to review the results.  Follow-Up: At [facility], you and your health needs are our priority.  As part of our continuing mission to provide you with exceptional heart care, we have created designated Provider Care Teams.  These Care Teams include your primary Cardiologist (physician) and Advanced Practice Providers (APPs -  Physician Assistants and Nurse Practitioners) who all work together to provide you with the care you need, when you need it.  We recommend signing up for the patient portal called "MyChart".  Sign up information is provided on this After Visit Summary.  MyChart is used to connect with patients for Virtual Visits (Telemedicine).  Patients are able to view lab/test results, encounter notes, upcoming appointments, etc.  Non-urgent messages can be sent to your provider as well.   To learn more about what you can do with MyChart, go to [email or web address].    Your next appointment:   6 month(s)  The format for your next appointment:   In Person  Provider:   Werner Lean, MD     Important Information About Sugar

## 2022-11-16 ENCOUNTER — Ambulatory Visit: Payer: Medicare Other | Attending: Internal Medicine

## 2022-11-16 DIAGNOSIS — I739 Peripheral vascular disease, unspecified: Secondary | ICD-10-CM

## 2022-11-16 DIAGNOSIS — E785 Hyperlipidemia, unspecified: Secondary | ICD-10-CM

## 2022-11-16 DIAGNOSIS — I10 Essential (primary) hypertension: Secondary | ICD-10-CM

## 2022-11-17 LAB — BASIC METABOLIC PANEL
BUN/Creatinine Ratio: 15 (ref 12–28)
BUN: 13 mg/dL (ref 8–27)
CO2: 22 mmol/L (ref 20–29)
Calcium: 9.4 mg/dL (ref 8.7–10.3)
Chloride: 100 mmol/L (ref 96–106)
Creatinine, Ser: 0.87 mg/dL (ref 0.57–1.00)
Glucose: 95 mg/dL (ref 70–99)
Potassium: 4.5 mmol/L (ref 3.5–5.2)
Sodium: 138 mmol/L (ref 134–144)
eGFR: 64 mL/min/{1.73_m2} (ref >59)

## 2022-11-17 LAB — CBC
Hematocrit: 35.3 % (ref 34.0–46.6)
Hemoglobin: 10.9 g/dL — ABNORMAL LOW (ref 11.1–15.9)
MCH: 24 pg — ABNORMAL LOW (ref 26.6–33.0)
MCHC: 30.9 g/dL — ABNORMAL LOW (ref 31.5–35.7)
MCV: 78 fL — ABNORMAL LOW (ref 79–97)
Platelets: 254 10*3/uL (ref 150–450)
RBC: 4.55 x10E6/uL (ref 3.77–5.28)
RDW: 14.2 % (ref 11.7–15.4)
WBC: 7.8 10*3/uL (ref 3.4–10.8)

## 2022-11-17 LAB — PRO B NATRIURETIC PEPTIDE: NT-Pro BNP: 196 pg/mL (ref 0–738)

## 2022-11-22 ENCOUNTER — Telehealth: Payer: Self-pay | Admitting: Internal Medicine

## 2022-11-22 NOTE — Telephone Encounter (Signed)
Left a message to call back.

## 2022-11-22 NOTE — Telephone Encounter (Signed)
Pt is returning call in regards to labs. Requesting call back.

## 2022-11-23 NOTE — Telephone Encounter (Signed)
The patient has been notified of the result and verbalized understanding.  All questions (if any) were answered. Precious Gilding, RN 11/23/2022 8:59 AM   Reviewed lab results.

## 2022-12-19 ENCOUNTER — Other Ambulatory Visit: Payer: Self-pay | Admitting: *Deleted

## 2022-12-19 DIAGNOSIS — R600 Localized edema: Secondary | ICD-10-CM

## 2022-12-20 ENCOUNTER — Encounter
Payer: Medicare Other | Attending: Physical Medicine and Rehabilitation | Admitting: Physical Medicine and Rehabilitation

## 2022-12-20 ENCOUNTER — Encounter: Payer: Self-pay | Admitting: Physical Medicine and Rehabilitation

## 2022-12-20 VITALS — BP 123/75 | HR 72 | Ht 65.0 in

## 2022-12-20 DIAGNOSIS — Z7409 Other reduced mobility: Secondary | ICD-10-CM | POA: Diagnosis not present

## 2022-12-20 DIAGNOSIS — R7303 Prediabetes: Secondary | ICD-10-CM | POA: Insufficient documentation

## 2022-12-20 NOTE — Patient Instructions (Signed)
Foods that may reduce pain: 1) Ginger (especially studied for arthritis)- reduce leukotriene production to decrease inflammation 2) Blueberries- high in phytonutrients that decrease inflammation 3) Salmon- marine omega-3s reduce joint swelling and pain 4) Pumpkin seeds- reduce inflammation 5) dark chocolate- reduces inflammation 6) turmeric- reduces inflammation 7) tart cherries - reduce pain and stiffness 8) extra virgin olive oil - its compound olecanthal helps to block prostaglandins  9) chili peppers- can be eaten or applied topically via capsaicin 10) mint- helpful for headache, muscle aches, joint pain, and itching 11) garlic- reduces inflammation  Link to further information on diet for chronic pain: [email or web address]    Diabetes: -check CBGs daily, log, and bring log to follow-up appointment -avoid sugar, bread, pasta, rice -avoid snacking -perform daily foot exam and at least annual eye exam -try to incorporate into your diet some of the following foods which are good for diabetes: 1) cinnamon- imitates effects of insulin, increasing glucose transport into cells ([place] or [place] cinnamon is best, least processed) 2) nuts- can slow down the blood sugar response of carbohydrate rich foods 3) oatmeal- contains and anti-inflammatory compound avenanthramide 4) whole-milk yogurt (best types are no sugar, [place] yogurt, or goat/sheep yogurt) 5) beans- high in protein, fiber, and vitamins, low glycemic index 6) broccoli- great source of vitamin A and C 7) quinoa- higher in protein and fiber than other grains 8) spinach- high in vitamin A, fiber, and protein 9) olive oil- reduces glucose levels, LDL, and triglycerides 10) salmon- excellent amount of omega-3-fatty acids 11) walnuts- rich in antioxidants 12) apples- high in fiber and quercetin 13) carrots- highly nutritious with low impact on blood  sugar 14) eggs- improve HDL (good cholesterol), high in protein, keep you satiated 15) turmeric: improves blood sugars, cardiovascular disease, and protects kidney health 16) garlic: improves blood sugar, blood pressure, pain 17) tomatoes: highly nutritious with low impact on blood sugar

## 2022-12-20 NOTE — Progress Notes (Signed)
Subjective:    Patient ID: Angela Wang, female    DOB: 01/23/1931, 89 y.o.   MRN: [id number]  HPI Angela Wang is an 89 year old woman who presents to establish care for neuropathy  1) Neuropathy - has had for many years - did not tolerate topamax well -would like to try Qutenza -she failed gabapentin -she does not want to try Lyrica  2) Prediabetes -she notices that when she eats sugar her pain worsens   Pain Inventory Average Pain 10 Pain Right Now 10 My pain is constant, sharp, burning, stabbing, tingling, and aching  In the last 24 hours, has pain interfered with the following? General activity 10 Relation with others 10 Enjoyment of life 0 What TIME of day is your pain at its worst? morning , daytime, evening, night, and varies Sleep (in general) Good  Pain is worse with: walking, bending, and inactivity Pain improves with: rest, therapy/exercise, TENS, and injections Relief from Meds: 10  use a walker ability to climb steps?  no do you drive?  no needs help with transfers Do you have any goals in this area?  yes  retired I need assistance with the following:  meal prep, household duties, and shopping Do you have any goals in this area?  yes  weakness tremor confusion depression  Any changes since last visit?  no  Any changes since last visit?  no    Family History  Problem Relation Age of Onset   Cancer Mother        pancreatic   Coronary artery disease Father    Cancer Sister         breast   Asthma Sister    Breast cancer Sister    Cancer Brother        prostate   Allergies Brother    Cancer Maternal Aunt        colon   Diabetes Other    Cancer Other        ovarian   Social History   Socioeconomic History   Marital status: Widowed    Spouse name: Not on file   Number of children: 4   Years of education: Not on file   Highest education level: Not on file  Occupational History   Occupation: retired    Fish farm manager: RETIRED     Comment: [employer]  Tobacco Use   Smoking status: Never   Smokeless tobacco: Never  Vaping Use   Vaping Use: Never used  Substance and Sexual Activity   Alcohol use: No   Drug use: No   Sexual activity: Not on file  Other Topics Concern   Not on file  Social History Narrative   Not on file   Social Determinants of Health   Financial Resource Strain: Not on file  Food Insecurity: Not on file  Transportation Needs: Not on file  Physical Activity: Not on file  Stress: Not on file  Social Connections: Not on file   Past Surgical History:  Procedure Laterality Date   [place]     total right knee replacement   KNEE SURGERY     Past Medical History:  Diagnosis Date   ALLERGIC RHINITIS 06/21/2007   Qualifier: Diagnosis of  By: Wynona Luna    Allergy    allergic rhinitis   Anemia    nos  ANEMIA-NOS 06/21/2007   Qualifier: Diagnosis of  By: Wynona Luna    ANXIETY 06/21/2007   Qualifier: Diagnosis of  By: Wynona Luna    Arthritis    osteoarthritis   Atypical chest pain 09/2008   negative myoview   BACK PAIN, LUMBAR 03/06/2010   Qualifier: Diagnosis of  By: Wynona Luna    CEREBRAL ANEURYSM 06/21/2007   Qualifier: History of  By: Yoo DO, [place], HX OF 06/21/2007   Qualifier: Diagnosis of  By: Wynona Luna    CHEST PAIN, ATYPICAL 09/02/2008   Qualifier: Diagnosis of  By: Wynona Luna    Chronic sinusitis    CONSTIPATION, CHRONIC 09/20/2009   Qualifier: Diagnosis of  By: Olevia Perches MD, Lowella Bandy    Depression    DEPRESSION 06/21/2007   Qualifier: Diagnosis of  By: Wynona Luna    Diverticulosis    DIVERTICULOSIS-COLON 06/09/2008   Qualifier: Diagnosis of  By: Chester Holstein NP, Nevin Bloodgood     Fibromyalgia    FIBROMYALGIA 06/21/2007   Qualifier: Diagnosis of  By: Berneta Sages, HX OF 08/25/2008    Qualifier: Diagnosis of  By: Nelson-Smith CMA (AAMA), Dottie     GERD 05/03/2008   Qualifier: Diagnosis of  By: Julien Girt CMA, Leigh     GERD (gastroesophageal reflux disease)    Headache 03/15/2011   History of solitary pulmonary nodule    right, stable by CT   Hyperlipidemia    HYPERLIPIDEMIA 06/21/2007   Qualifier: Diagnosis of  By: Wynona Luna    Hypertension    HYPERTENSION 06/21/2007   Qualifier: Diagnosis of  By: Wynona Luna    HYPOGLYCEMIA 10/24/2010   Qualifier: Diagnosis of  By: Wynona Luna    HYPOTHYROIDISM 07/22/2008   Qualifier: Diagnosis of  By: Wynona Luna    IBS 06/21/2007   Qualifier: Diagnosis of  By: Wynona Luna    IBS (irritable bowel syndrome)    INSOMNIA, CHRONIC 07/19/2009   Qualifier: Diagnosis of  By: Wynona Luna    LEG PAIN, RIGHT 02/01/2009   Qualifier: Diagnosis of  By: Wynona Luna    Leukopenia    chronic   LEUKOPENIA, CHRONIC 06/21/2007   Qualifier: Diagnosis of  By: Wynona Luna    NECK PAIN 02/03/2008   Qualifier: Diagnosis of  By: Wynona Luna    NUMBNESS 01/29/2008   Qualifier: Diagnosis of  By: Loanne Drilling MD, Derek Jack NOS-Unspec 06/21/2007   Centricity Description: DEGENERATIVE JOINT DISEASE, MILD Qualifier: Diagnosis of  By: Wynona Luna  Centricity Description: OSTEOARTHRITIS Qualifier: Diagnosis of  By: Wynona Luna    PULMONARY NODULE 12/26/2007   Qualifier: Diagnosis of  By: Wynona Luna    Routine general medical examination at a health care facility    SCIATICA, RIGHT 05/06/2008   Qualifier: Diagnosis of  By: Fredrich Birks, Sarah     Stroke [facility])    THROMBOCYTOPENIA 02/09/2009   Qualifier: Diagnosis of  By: Wynona Luna    UNSTEADY GAIT 07/28/2009   Qualifier: Diagnosis of  By: Wynona Luna    VENOUS INSUFFICIENCY 05/03/2008   Qualifier: Diagnosis of  By: Julien Girt CMA, Leigh     Ht '5\' 5"'$  (1.651 m)   BMI 33.18 kg/m  Opioid Risk Score:   Fall Risk Score:   `1  Depression screen [facility] 2/9     12/20/2022   10:49 AM 06/29/2013    9:59 AM  Depression screen PHQ 2/9  Decreased Interest 3 0  Down, Depressed, Hopeless 3 0  PHQ - 2 Score 6 0  Altered sleeping 3   Tired, decreased energy 3   Change in appetite 1   Feeling bad or failure about yourself  1   Trouble concentrating 1   Moving slowly or fidgety/restless 1   Suicidal thoughts 0   PHQ-9 Score 16     Review of Systems  Cardiovascular:  Positive for leg swelling.  Gastrointestinal:  Positive for constipation and nausea.  Musculoskeletal:  Positive for gait problem.       Pain in both legs, pain both feet, pain in both hands, & right arm pain  Neurological:  Positive for tremors and weakness.  Psychiatric/Behavioral:  Positive for confusion.        Depression  All other systems reviewed and are negative.      Objective:   Physical Exam  Gen: no distress, normal appearing HEENT: oral mucosa pink and moist, NCAT Cardio: Reg rate Chest: normal effort, normal rate of breathing Abd: soft, non-distended Ext: no edema Psych: pleasant, normal affect Skin: intact Neuro: Alert and oriented x3 Musculoskeletal: Venous stasis of lower extremities. Using cane      Assessment & Plan:   1) Chronic Pain Syndrome secondary to diabetic neuropathy -Discussed current symptoms of pain and history of pain.  -Discussed benefits of exercise in reducing pain. -Discussed following foods that may reduce pain: 1) Ginger (especially studied for arthritis)- reduce leukotriene production to decrease inflammation 2) Blueberries- high in phytonutrients that decrease inflammation 3) Salmon- marine omega-3s reduce joint swelling and pain 4) Pumpkin seeds- reduce inflammation 5) dark chocolate- reduces inflammation 6) turmeric- reduces inflammation 7) tart cherries - reduce pain and stiffness 8) extra virgin olive oil - its compound olecanthal helps to block prostaglandins  9) chili peppers- can be  eaten or applied topically via capsaicin 10) mint- helpful for headache, muscle aches, joint pain, and itching 11) garlic- reduces inflammation  Link to further information on diet for chronic pain: [email or web address]    2) Diabetes: -check CBGs daily, log, and bring log to follow-up appointment -avoid sugar, bread, pasta, rice -avoid snacking -perform daily foot exam and at least annual eye exam -try to incorporate into your diet some of the following foods which are good for diabetes: 1) cinnamon- imitates effects of insulin, increasing glucose transport into cells ([place] or [place] cinnamon is best, least processed) 2) nuts- can slow down the blood sugar response of carbohydrate rich foods 3) oatmeal- contains and anti-inflammatory compound avenanthramide 4) whole-milk yogurt (best types are no sugar, [place] yogurt, or goat/sheep yogurt) 5) beans- high in protein, fiber, and vitamins, low glycemic index 6) broccoli- great source of vitamin A and C 7) quinoa- higher in protein and fiber than other grains 8) spinach- high in vitamin A, fiber, and protein 9) olive oil- reduces glucose levels, LDL, and triglycerides 10) salmon- excellent amount of omega-3-fatty acids 11) walnuts- rich in antioxidants 12) apples- high in fiber and quercetin 13) carrots- highly nutritious with low impact on blood sugar 14) eggs- improve HDL (good cholesterol), high in protein, keep you satiated 15) turmeric: improves blood sugars, cardiovascular disease, and protects kidney health 16) garlic: improves blood sugar, blood pressure, pain 17) tomatoes: highly nutritious with low impact  on blood sugar

## 2023-01-01 ENCOUNTER — Encounter (HOSPITAL_COMMUNITY): Payer: Medicare Other

## 2023-01-01 DIAGNOSIS — M79673 Pain in unspecified foot: Secondary | ICD-10-CM

## 2023-01-08 ENCOUNTER — Other Ambulatory Visit: Payer: Self-pay | Admitting: Internal Medicine

## 2023-01-11 ENCOUNTER — Telehealth: Payer: Self-pay | Admitting: Physical Medicine and Rehabilitation

## 2023-01-11 NOTE — Telephone Encounter (Signed)
The patient called and stated that Dr. Ranell Patrick set her up for [facility] to come to her house. But they didn't show up so she called them and they told her they don't have enough staff right now. She wants to know if there is anywhere else and Dr. Ranell Patrick could call her. Her number is [phone].

## 2023-01-16 ENCOUNTER — Telehealth: Payer: Self-pay | Admitting: *Deleted

## 2023-01-16 NOTE — Telephone Encounter (Signed)
Returned call to Wilson Singer at [facility] to give verbal orders per referral in system. Also needed to know if patient has diabetes or pre diabetes? Pre-DM verbal orders given. Nothing further needed.

## 2023-02-18 ENCOUNTER — Telehealth: Payer: Self-pay

## 2023-02-18 DIAGNOSIS — E1142 Type 2 diabetes mellitus with diabetic polyneuropathy: Secondary | ICD-10-CM

## 2023-02-18 MED ORDER — QUTENZA (4 PATCH) 8 % EX KIT: 4.0000 | PACK | Freq: Once | CUTANEOUS | 0 refills | Status: AC

## 2023-02-18 NOTE — Telephone Encounter (Signed)
Tried to call patient to let her know that she will be responsible for 20% for the patches, patient did not respond on phone when I called her, I stated who I was and where I called from and I could here patient in the background but no answer, I will still send patches to pharmacy to see if they can be covered under her pharmacy benefits however if patient does not want to pay 20% then she will need to r/s so that she we can allow time for the approval and the patches to be sent.

## 2023-02-18 NOTE — Telephone Encounter (Signed)
PA submitted for Qutenza Patches through cover my meds

## 2023-02-19 ENCOUNTER — Encounter: Payer: Medicare Other | Admitting: Physical Medicine and Rehabilitation

## 2023-02-20 ENCOUNTER — Telehealth: Payer: Self-pay | Admitting: *Deleted

## 2023-02-20 NOTE — Telephone Encounter (Signed)
FYI patient slipped and fell out of bed on Sunday. Patient experiencing hip pain. Call back for Ariel [phone].

## 2023-02-21 ENCOUNTER — Encounter
Payer: Medicare Other | Attending: Physical Medicine and Rehabilitation | Admitting: Physical Medicine and Rehabilitation

## 2023-02-21 ENCOUNTER — Ambulatory Visit (HOSPITAL_COMMUNITY): Payer: Medicare Other

## 2023-02-21 DIAGNOSIS — M25552 Pain in left hip: Secondary | ICD-10-CM | POA: Insufficient documentation

## 2023-02-21 NOTE — Progress Notes (Signed)
Left msg to discuss recent fall/hip pain

## 2023-03-13 ENCOUNTER — Telehealth: Payer: Self-pay | Admitting: *Deleted

## 2023-03-13 NOTE — Telephone Encounter (Signed)
Marcie Bal PT with [facility] wants to know if you will orders for patient to continue  PT and HH with an aide?

## 2023-03-13 NOTE — Telephone Encounter (Signed)
Angela Wang notified.

## 2023-03-21 ENCOUNTER — Ambulatory Visit: Payer: Medicare Other | Admitting: Physician Assistant

## 2023-03-21 ENCOUNTER — Ambulatory Visit (HOSPITAL_COMMUNITY)
Admission: RE | Admit: 2023-03-21 | Discharge: 2023-03-21 | Disposition: A | Discharge status: Home / Self Care | Payer: Medicare Other | Source: Ambulatory Visit | Attending: Physician Assistant | Admitting: Physician Assistant

## 2023-03-21 VITALS — BP 147/84 | HR 80 | Temp 97.1°F | Resp 18 | Ht 65.0 in | Wt 207.0 lb

## 2023-03-21 DIAGNOSIS — R600 Localized edema: Secondary | ICD-10-CM

## 2023-03-21 DIAGNOSIS — I872 Venous insufficiency (chronic) (peripheral): Secondary | ICD-10-CM | POA: Diagnosis not present

## 2023-03-21 NOTE — Progress Notes (Signed)
Office Note     CC:  follow up Requesting Provider:  Burnis Medin, *  HPI: Angela Wang is a 89 y.o. (16-Jun-1931) female who presents for evaluation of bilateral lower extremity edema referred by her cardiologist.  She is seen today in a wheelchair accompanied by her daughter.  Patient states she has noticed an increase in edema and pain in her lower legs over the past several months.  The pain is bad enough to keep her from walking or having any form of exercise.  She does not wear compression.  She does not elevate her legs and in fact is sleeping with her legs down in a chair.  She describes blister formation of bilateral lower shins.  She denies any drainage or weeping at this time.  She also denies any redness, fevers, chills, nausea/vomiting.  She has no history of DVT, venous ulcerations, trauma, or prior vascular interventions.  She denies chronic kidney disease.  She had a normal echo last year.   Past Medical History:  Diagnosis Date   ALLERGIC RHINITIS 06/21/2007   Qualifier: Diagnosis of  By: Nena Jordan    Allergy    allergic rhinitis   Anemia    nos   ANEMIA-NOS 06/21/2007   Qualifier: Diagnosis of  By: Nena Jordan    ANXIETY 06/21/2007   Qualifier: Diagnosis of  By: Nena Jordan    Arthritis    osteoarthritis   Atypical chest pain 09/2008   negative myoview   BACK PAIN, LUMBAR 03/06/2010   Qualifier: Diagnosis of  By: Nena Jordan    CEREBRAL ANEURYSM 06/21/2007   Qualifier: History of  By: Nena Jordan    CEREBROVASCULAR ACCIDENT, HX OF 06/21/2007   Qualifier: Diagnosis of  By: Nena Jordan    CHEST PAIN, ATYPICAL 09/02/2008   Qualifier: Diagnosis of  By: Nena Jordan    Chronic sinusitis    CONSTIPATION, CHRONIC 09/20/2009   Qualifier: Diagnosis of  By: Juanda Chance MD, Hedwig Morton    Depression    DEPRESSION 06/21/2007   Qualifier: Diagnosis of  By: Nena Jordan    Diverticulosis    DIVERTICULOSIS-COLON 06/09/2008    Qualifier: Diagnosis of  By: Wilmon Pali NP, Gunnar Fusi     Fibromyalgia    FIBROMYALGIA 06/21/2007   Qualifier: Diagnosis of  By: Laveda Abbe, HX OF 08/25/2008   Qualifier: Diagnosis of  By: Nelson-Smith CMA (AAMA), Dottie     GERD 05/03/2008   Qualifier: Diagnosis of  By: Renaldo Fiddler CMA, Leigh     GERD (gastroesophageal reflux disease)    Headache 03/15/2011   History of solitary pulmonary nodule    right, stable by CT   Hyperlipidemia    HYPERLIPIDEMIA 06/21/2007   Qualifier: Diagnosis of  By: Nena Jordan    Hypertension    HYPERTENSION 06/21/2007   Qualifier: Diagnosis of  By: Nena Jordan    HYPOGLYCEMIA 10/24/2010   Qualifier: Diagnosis of  By: Nena Jordan    HYPOTHYROIDISM 07/22/2008   Qualifier: Diagnosis of  By: Nena Jordan    IBS 06/21/2007   Qualifier: Diagnosis of  By: Nena Jordan    IBS (irritable bowel syndrome)    INSOMNIA, CHRONIC 07/19/2009   Qualifier: Diagnosis of  By: Nena Jordan    LEG PAIN, RIGHT 02/01/2009   Qualifier: Diagnosis of  By: Nena Jordan    Leukopenia    chronic   LEUKOPENIA, CHRONIC 06/21/2007   Qualifier: Diagnosis of  By: Nena Jordan    NECK PAIN 02/03/2008   Qualifier: Diagnosis of  By: Nena Jordan    NUMBNESS 01/29/2008   Qualifier: Diagnosis of  By: Everardo All MD, Dorena Dew NOS-Unspec 06/21/2007   Centricity Description: DEGENERATIVE JOINT DISEASE, MILD Qualifier: Diagnosis of  By: Nena Jordan  Centricity Description: OSTEOARTHRITIS Qualifier: Diagnosis of  By: Nena Jordan    PULMONARY NODULE 12/26/2007   Qualifier: Diagnosis of  By: Nena Jordan    Routine general medical examination at a health care facility    SCIATICA, RIGHT 05/06/2008   Qualifier: Diagnosis of  By: Laural Roes, Sarah     Stroke    THROMBOCYTOPENIA 02/09/2009   Qualifier: Diagnosis of  By: Nena Jordan    UNSTEADY GAIT 07/28/2009   Qualifier: Diagnosis of  By: Nena Jordan    VENOUS  INSUFFICIENCY 05/03/2008   Qualifier: Diagnosis of  By: Renaldo Fiddler CMA, Marliss Czar      Past Surgical History:  Procedure Laterality Date   ABDOMINAL HYSTERECTOMY     ANEURYSM COILING     APPENDECTOMY     CHOLECYSTECTOMY     JOINT REPLACEMENT     total right knee replacement   KNEE SURGERY      Social History   Socioeconomic History   Marital status: Widowed    Spouse name: Not on file   Number of children: 4   Years of education: Not on file   Highest education level: Not on file  Occupational History   Occupation: retired    Associate Professor: RETIRED    Comment: tobacco company  Tobacco Use   Smoking status: Never   Smokeless tobacco: Never  Vaping Use   Vaping Use: Never used  Substance and Sexual Activity   Alcohol use: No   Drug use: No   Sexual activity: Not on file  Other Topics Concern   Not on file  Social History Narrative   Not on file   Social Determinants of Health   Financial Resource Strain: Not on file  Food Insecurity: Not on file  Transportation Needs: Not on file  Physical Activity: Not on file  Stress: Not on file  Social Connections: Not on file  Intimate Partner Violence: Not on file    Family History  Problem Relation Age of Onset   Cancer Mother        pancreatic   Coronary artery disease Father    Cancer Sister         breast   Asthma Sister    Breast cancer Sister    Cancer Brother        prostate   Allergies Brother    Cancer Maternal Aunt        colon   Diabetes Other    Cancer Other        ovarian    Current Outpatient Medications  Medication Sig Dispense Refill   ALPRAZolam (XANAX) 0.5 MG tablet Take 0.5 mg by mouth 3 (three) times daily as needed for sleep or anxiety.     B Complex Vitamins (B COMPLEX 100 PO) Take 1 tablet by mouth daily.     bisacodyl (DULCOLAX) 5 MG EC tablet as needed for moderate constipation or severe constipation.     Calcium-Magnesium-Vitamin D 500-250-200  MG-MG-UNIT TABS Take 1 tablet by mouth daily.      clopidogrel (PLAVIX) 75 MG tablet Take 1 tablet (75 mg total) by mouth daily. (Patient not taking: Reported on 11/07/2022) 90 tablet 3   co-enzyme Q-10 30 MG capsule Take 30 mg by mouth 3 (three) times daily.     cyclobenzaprine (FLEXERIL) 10 MG tablet Take 1 tablet (10 mg total) by mouth 2 (two) times daily as needed for muscle spasms. 20 tablet 0   diclofenac Sodium (VOLTAREN) 1 % GEL Apply 4 g topically 4 (four) times daily. 100 g 0   ezetimibe (ZETIA) 10 MG tablet TAKE ONE TABLET BY MOUTH DAILY. MUST MAKE APPOINTMENT FOR FURTHER REFILLS 90 tablet 3   fluticasone (FLONASE) 50 MCG/ACT nasal spray Place 2 sprays into both nostrils as needed for allergies or rhinitis.     folic acid (FOLVITE) 1 MG tablet Take 1 mg by mouth daily.     furosemide (LASIX) 20 MG tablet Take 1 tablet (20 mg total) by mouth daily. (Patient taking differently: Take 20 mg by mouth 3 (three) times a week.) 90 tablet 3   ipratropium (ATROVENT) 0.03 % nasal spray as needed for congestion.     iron polysaccharides (NIFEREX) 150 MG capsule Take 150 mg by mouth daily.     lidocaine (XYLOCAINE) 5 % ointment Apply topically.     linaclotide (LINZESS) 145 MCG CAPS capsule Take 145 mcg by mouth as needed (irritable bowel).     Olopatadine HCl 0.2 % SOLN Apply 1 drop to eye daily.     omeprazole (PRILOSEC) 40 MG capsule TAKE ONE CAPSULE IN THE MORNING, 30-60 MINUTES BEFORE BREAKFAST. 90 capsule 1   pravastatin (PRAVACHOL) 20 MG tablet Take 1 tablet (20 mg total) by mouth every evening. 90 tablet 3   promethazine-dextromethorphan (PROMETHAZINE-DM) 6.25-15 MG/5ML syrup Take 5 mLs by mouth as directed.     traMADol (ULTRAM) 50 MG tablet Take 1 tablet (50 mg total) by mouth every 12 (twelve) hours as needed. 30 tablet 0   triamcinolone cream (KENALOG) 0.1 % MIX WITH CETAPHIL MOISTURIZING CREAM. APPLY THREE TIMES DAILY AS NEEDED     vitamin C (ASCORBIC ACID) 500 MG tablet Take 500 mg by mouth.     No current facility-administered  medications for this visit.    Allergies  Allergen Reactions   Amitriptyline Hcl Swelling and Other (See Comments)    Headache   Cyclobenzaprine Swelling   Escitalopram Oxalate     REACTION: Hallucinations  Other Reaction(s): Mental Status Changes, Psychosis   Tramadol Nausea And Vomiting and Other (See Comments)    Other Reaction(s): GI Intolerance   Furosemide Nausea And Vomiting    Abdominal pain  Other Reaction(s): GI Intolerance, Other (See Comments)   Hydrocodone Other (See Comments)    Other Reaction(s): GI Intolerance   Hydrocodone-Acetaminophen     REACTION: stomach upset   Other Swelling    Other reaction(s): Other (See Comments) ANASTHESIA   Oxycodone-Acetaminophen     REACTION: Nausea and upset stomach   Terbinafine Other (See Comments)   Aspirin Other (See Comments)    REACTION: stomach upset   Baclofen Other (See Comments)    Other Reaction(s): GI Intolerance   Citalopram Other (See Comments)    Other Reaction(s): GI Intolerance   Codeine Nausea Only and Other (See Comments)    REACTION: stomach upset  Other Reaction(s): GI Intolerance   Diazepam     Other Reaction(s): GI Intolerance   Escitalopram Oxalate  Other Reaction(s): GI Intolerance   Gabapentin     GI Upset  Other Reaction(s): GI Intolerance, Other (See Comments)   Hydrocodone-Acetaminophen     Other Reaction(s): GI Intolerance  Headache   Lactulose Other (See Comments)    Passed out  Other Reaction(s): Other (See Comments)   Latex Rash    REACTION: rash REACTION: rash REACTION: rash   Levofloxacin Nausea And Vomiting and Nausea Only    Other Reaction(s): GI Intolerance, Other (See Comments)   Lorazepam Other (See Comments)    REACTION: Throat Swelling  Other Reaction(s): GI Intolerance, Other (See Comments)  Other reaction(s): Other (See Comments), REACTION: Throat Swelling   Moxifloxacin Nausea And Vomiting and Nausea Only    Other Reaction(s): GI Intolerance, Other (See  Comments)   Oxycodone Other (See Comments)    Headache, change in mental status  Other Reaction(s): Mental Status Changes, Other (See Comments), Psychosis  Headache   Pantoprazole Nausea Only and Other (See Comments)    Other Reaction(s): GI Intolerance, Other (See Comments)   Polyethylene Glycol 3350 Other (See Comments)    Makes her bloated  Other Reaction(s): GI Intolerance   Rabeprazole Nausea Only and Other (See Comments)    Other Reaction(s): GI Intolerance   Terfenadine     REACTION: stomach upset  Other Reaction(s): GI Intolerance     REVIEW OF SYSTEMS:   [X]  denotes positive finding, [ ]  denotes negative finding Cardiac  Comments:  Chest pain or chest pressure:    Shortness of breath upon exertion:    Short of breath when lying flat:    Irregular heart rhythm:        Vascular    Pain in calf, thigh, or hip brought on by ambulation:    Pain in feet at night that wakes you up from your sleep:     Blood clot in your veins:    Leg swelling:         Pulmonary    Oxygen at home:    Productive cough:     Wheezing:         Neurologic    Sudden weakness in arms or legs:     Sudden numbness in arms or legs:     Sudden onset of difficulty speaking or slurred speech:    Temporary loss of vision in one eye:     Problems with dizziness:         Gastrointestinal    Blood in stool:     Vomited blood:         Genitourinary    Burning when urinating:     Blood in urine:        Psychiatric    Major depression:         Hematologic    Bleeding problems:    Problems with blood clotting too easily:        Skin    Rashes or ulcers:        Constitutional    Fever or chills:      PHYSICAL EXAMINATION:  Vitals:   03/21/23 1308  BP: (!) 147/84  Pulse: 80  Resp: 18  Temp: (!) 97.1 F (36.2 C)  TempSrc: Temporal  SpO2: 98%  Weight: 207 lb (93.9 kg)  Height: 5\' 5"  (1.651 m)    General:  WDWN in NAD; vital signs documented above Gait: Not  observed HENT: WNL, normocephalic Pulmonary: normal non-labored breathing , without Rales, rhonchi,  wheezing Cardiac: regular HR Abdomen: soft, NT, no  masses Skin: without rashes Vascular Exam/Pulses: Brisk multiphasic DP waveforms bilaterally Extremities: Bilateral lower extremity pitting edema; edema extends into the distal thigh bilaterally she is experiencing hyperplasia of the skin of both ankles; she has hyperpigmentation however no redness or erythema Musculoskeletal: no muscle wasting or atrophy  Neurologic: A&O X 3 Psychiatric:  The pt has Normal affect.   Non-Invasive Vascular Imaging:    RIGHT         Reflux NoRefluxReflux TimeDiameter cmsComments                               Yes                                        +--------------+---------+------+-----------+------------+-------------+  CFV          no                                                   +--------------+---------+------+-----------+------------+-------------+  FV mid        no                                                   +--------------+---------+------+-----------+------------+-------------+  GSV at SFJ    no                            0.59                   +--------------+---------+------+-----------+------------+-------------+  GSV prox thighno                            0.21                   +--------------+---------+------+-----------+------------+-------------+  GSV mid thigh                                       out of fascia  +--------------+---------+------+-----------+------------+-------------+  SSV prox calf no                            0.24                     ASSESSMENT/PLAN:: 89 y.o. female here for evaluation of bilateral lower extremity edema  -Angela Wang is experiencing more pain and swelling of both legs over the past several months.  Unfortunately this has decreased her mobility likely resulting in more swelling.  She is  sleeping with her legs down at night.  She is not wearing any form of compression.  She has developed hyperplasia of the skin at her ankles but not quite appearing to be papillomatosis.  She has venous insufficiency of the greater saphenous vein on the right lower extremity venous reflux study however no deep venous reflux.  Study was also negative for DVT.  Patient also has well-perfused feet with multiphasic  dopplerable flow in both DP arteries.  I educated the patient and her daughter on the importance of proper leg elevation periodically throughout the day above the level of her heart.  She will also need to wear some form of compression on a daily basis at least until her edema is better controlled.  She was wrapped in Ace wrap's from her toes to her knee bilaterally.  She needs to avoid prolonged sitting and standing during the day.  She will also need to sleep with her legs elevated instead of being on the floor while sleeping in a chair.  If edema does not improve with the above measures we can obtain a noncontrasted CT abdomen and pelvis to rule out any outflow obstruction.  Patient is also requested a referral to pain management for her left hip pain.  Patient will otherwise follow-up on an as-needed basis.   Emilie Rutter, PA-C [facility] [phone]  Clinic MD:   Edilia Bo

## 2023-03-27 ENCOUNTER — Telehealth: Payer: Self-pay | Admitting: *Deleted

## 2023-03-28 ENCOUNTER — Encounter: Payer: Medicare Other | Admitting: Physical Medicine and Rehabilitation

## 2023-04-11 NOTE — Telephone Encounter (Signed)
Pharmacy has been unable to reach pt about qutenza.

## 2023-04-30 ENCOUNTER — Ambulatory Visit: Payer: Medicare Other | Admitting: Internal Medicine

## 2023-07-05 ENCOUNTER — Other Ambulatory Visit: Payer: Self-pay | Admitting: Internal Medicine

## 2023-07-25 NOTE — Progress Notes (Signed)
Cardiology Office Note:    Date:  07/26/2023   ID:  Angela Wang, DOB 26-Oct-1931, MRN [id number]  PCP:  Burnis Medin, PA-C  [facility] Cardiologist:  Riley Lam MD [facility] Electrophysiologist:  None   CC: Follow up PAD, HTN, HLD  History of Present Illness:    Angela Wang is a 89 y.o. female with a hx of HTN, prior cerebral aneurysm 06/21/2007 notes), PAD (seen by podiatry with ABIs ~1.33 both legs in 2019), who presented for palpitations 10/04/20.  2022:  In interval; had moderate pulmonic insufficiency without RV dilation or dysfunction. 2023: Echo showed only mild PR; started pravastatin with plans to follow up today. 2024: Transitioned from [facility] to [facility]  Patient notes that she is doing poorly.   Since last visit notes that her stomach is messed up from all of her medication  Notes significant constipation and would like to come off some medications. Felt better with the omeprazole.   Saw a [facility] APP.  Was told to come back if needed   No chest pain or pressure .  No SOB/DOE and no PND/Orthopnea.Notes worsening LE edema.  This is complicated that on diuretics she has issues getting to the bathroom on time.   Past Medical History:  Diagnosis Date   ALLERGIC RHINITIS 06/21/2007   Qualifier: Diagnosis of  By: Nena Jordan    Allergy    allergic rhinitis   Anemia    nos   ANEMIA-NOS 06/21/2007   Qualifier: Diagnosis of  By: Nena Jordan    ANXIETY 06/21/2007   Qualifier: Diagnosis of  By: Nena Jordan    Arthritis    osteoarthritis   Atypical chest pain 09/2008   negative myoview   BACK PAIN, LUMBAR 03/06/2010   Qualifier: Diagnosis of  By: Nena Jordan    CEREBRAL ANEURYSM 06/21/2007   Qualifier: History of  By: Nena Jordan    CEREBROVASCULAR ACCIDENT, HX OF 06/21/2007   Qualifier: Diagnosis of  By: Nena Jordan    CHEST PAIN, ATYPICAL 09/02/2008   Qualifier: Diagnosis of  By: Nena Jordan    Chronic  sinusitis    CONSTIPATION, CHRONIC 09/20/2009   Qualifier: Diagnosis of  By: Juanda Chance MD, Hedwig Morton    Depression    DEPRESSION 06/21/2007   Qualifier: Diagnosis of  By: Nena Jordan    Diverticulosis    DIVERTICULOSIS-COLON 06/09/2008   Qualifier: Diagnosis of  By: Wilmon Pali NP, Gunnar Fusi     Fibromyalgia    FIBROMYALGIA 06/21/2007   Qualifier: Diagnosis of  By: Laveda Abbe, HX OF 08/25/2008   Qualifier: Diagnosis of  By: Nelson-Smith CMA (AAMA), Dottie     GERD 05/03/2008   Qualifier: Diagnosis of  By: Renaldo Fiddler CMA, Leigh     GERD (gastroesophageal reflux disease)    Headache 03/15/2011   History of solitary pulmonary nodule    right, stable by CT   Hyperlipidemia    HYPERLIPIDEMIA 06/21/2007   Qualifier: Diagnosis of  By: Nena Jordan    Hypertension    HYPERTENSION 06/21/2007   Qualifier: Diagnosis of  By: Nena Jordan    HYPOGLYCEMIA 10/24/2010   Qualifier: Diagnosis of  By: Nena Jordan    HYPOTHYROIDISM 07/22/2008   Qualifier: Diagnosis of  By: Nena Jordan    IBS 06/21/2007   Qualifier: Diagnosis of  By: Nena Jordan    IBS (irritable bowel syndrome)    INSOMNIA, CHRONIC 07/19/2009   Qualifier: Diagnosis of  By: Nena Jordan    LEG PAIN, RIGHT 02/01/2009   Qualifier: Diagnosis of  By: Nena Jordan    Leukopenia    chronic   LEUKOPENIA, CHRONIC 06/21/2007   Qualifier: Diagnosis of  By: Nena Jordan    NECK PAIN 02/03/2008   Qualifier: Diagnosis of  By: Nena Jordan    NUMBNESS 01/29/2008   Qualifier: Diagnosis of  By: Everardo All MD, Dorena Dew NOS-Unspec 06/21/2007   Centricity Description: DEGENERATIVE JOINT DISEASE, MILD Qualifier: Diagnosis of  By: Nena Jordan  Centricity Description: OSTEOARTHRITIS Qualifier: Diagnosis of  By: Nena Jordan    PULMONARY NODULE 12/26/2007   Qualifier: Diagnosis of  By: Nena Jordan    Routine general medical examination at a health care facility    SCIATICA, RIGHT  05/06/2008   Qualifier: Diagnosis of  By: Laural Roes, Sarah     Stroke [facility])    THROMBOCYTOPENIA 02/09/2009   Qualifier: Diagnosis of  By: Nena Jordan    UNSTEADY GAIT 07/28/2009   Qualifier: Diagnosis of  By: Nena Jordan    VENOUS INSUFFICIENCY 05/03/2008   Qualifier: Diagnosis of  By: Renaldo Fiddler CMA, Marliss Czar      Past Surgical History:  Procedure Laterality Date   ABDOMINAL HYSTERECTOMY     ANEURYSM COILING     APPENDECTOMY     CHOLECYSTECTOMY     JOINT REPLACEMENT     total right knee replacement   KNEE SURGERY      Current Medications: Current Meds  Medication Sig   ALPRAZolam (XANAX) 0.5 MG tablet Take 0.5 mg by mouth 3 (three) times daily as needed for sleep or anxiety.   amoxicillin-clavulanate (AUGMENTIN) 875-125 MG tablet Take 1 tablet by mouth 2 (two) times daily.   B Complex Vitamins (B COMPLEX 100 PO) Take 1 tablet by mouth daily.   bisacodyl (DULCOLAX) 5 MG EC tablet as needed for moderate constipation or severe constipation.   Calcium-Magnesium-Vitamin D 500-250-200 MG-MG-UNIT TABS Take 1 tablet by mouth daily.   co-enzyme Q-10 30 MG capsule Take 30 mg by mouth 3 (three) times daily.   cyclobenzaprine (FLEXERIL) 10 MG tablet Take 1 tablet (10 mg total) by mouth 2 (two) times daily as needed for muscle spasms.   diclofenac Sodium (VOLTAREN) 1 % GEL Apply 4 g topically 4 (four) times daily.   fluocinonide cream (LIDEX) 0.05 % every other day.   fluticasone (FLONASE) 50 MCG/ACT nasal spray Place 2 sprays into both nostrils as needed for allergies or rhinitis.   folic acid (FOLVITE) 1 MG tablet Take 1 mg by mouth daily.   furosemide (LASIX) 40 MG tablet Take 1 tablet (40 mg total) by mouth daily.   ipratropium (ATROVENT) 0.03 % nasal spray as needed for congestion.   iron polysaccharides (NIFEREX) 150 MG capsule Take 150 mg by mouth daily.   lidocaine (XYLOCAINE) 5 % ointment Apply topically.   linaclotide (LINZESS) 145 MCG CAPS capsule Take 145 mcg by mouth as  needed (irritable bowel).   Multiple Vitamin (MULTI-VITAMIN) tablet Take 1 tablet by mouth daily.   Olopatadine HCl 0.2 % SOLN Apply 1 drop to eye daily.   promethazine-dextromethorphan (PROMETHAZINE-DM) 6.25-15 MG/5ML syrup Take 5 mLs by mouth as directed.   traMADol (ULTRAM) 50 MG tablet  Take 1 tablet (50 mg total) by mouth every 12 (twelve) hours as needed.   triamcinolone cream (KENALOG) 0.1 % MIX WITH CETAPHIL MOISTURIZING CREAM. APPLY THREE TIMES DAILY AS NEEDED   vitamin C (ASCORBIC ACID) 500 MG tablet Take 500 mg by mouth.   [DISCONTINUED] ezetimibe (ZETIA) 10 MG tablet TAKE ONE TABLET BY MOUTH DAILY. MUST MAKE APPOINTMENT FOR FURTHER REFILLS   [DISCONTINUED] furosemide (LASIX) 20 MG tablet Take 1 tablet (20 mg total) by mouth daily.   [DISCONTINUED] omeprazole (PRILOSEC) 40 MG capsule TAKE ONE CAPSULE IN THE MORNING, 30-60 MINUTES BEFORE BREAKFAST.   [DISCONTINUED] pravastatin (PRAVACHOL) 20 MG tablet Take 1 tablet (20 mg total) by mouth every evening.     Allergies:   Amitriptyline hcl, Cyclobenzaprine, Escitalopram oxalate, Tramadol, Furosemide, Hydrocodone, Hydrocodone-acetaminophen, Other, Oxycodone-acetaminophen, Terbinafine, Aspirin, Baclofen, Citalopram, Codeine, Diazepam, Escitalopram oxalate, Gabapentin, Hydrocodone-acetaminophen, Lactulose, Latex, Levofloxacin, Lorazepam, Moxifloxacin, Oxycodone, Pantoprazole, Polyethylene glycol 3350, Rabeprazole, and Terfenadine   Social History   Socioeconomic History   Marital status: Widowed    Spouse name: Not on file   Number of children: 4   Years of education: Not on file   Highest education level: Not on file  Occupational History   Occupation: retired    Associate Professor: RETIRED    Comment: tobacco company  Tobacco Use   Smoking status: Never   Smokeless tobacco: Never  Vaping Use   Vaping status: Never Used  Substance and Sexual Activity   Alcohol use: No   Drug use: No   Sexual activity: Not on file  Other Topics Concern    Not on file  Social History Narrative   Not on file   Social Determinants of Health   Financial Resource Strain: Low Risk  (10/24/2018)   Received from [facility] visits prior to 01/26/2023., [facility] visits prior to 01/26/2023.   Overall Financial Resource Strain (CARDIA)    Difficulty of Paying Living Expenses: Not hard at all  Food Insecurity: No Food Insecurity (10/24/2018)   Received from [facility] visits prior to 01/26/2023., [facility] visits prior to 01/26/2023.   Hunger Vital Sign    Worried About Running Out of Food in the Last Year: Never true    Ran Out of Food in the Last Year: Never true  Transportation Needs: No Transportation Needs (10/24/2018)   Received from [facility] visits prior to 01/26/2023., [facility] visits prior to 01/26/2023.   PRAPARE - Administrator, Civil Service (Medical): No    Lack of Transportation (Non-Medical): No  Physical Activity: Not on file  Stress: Not on file  Social Connections: Not on file    Family History: The patient's family history includes Allergies in her brother; Asthma in her sister; Breast cancer in her sister; Cancer in her brother, maternal aunt, mother, sister, and another family member; Coronary artery disease in her father; Diabetes in an other family member. History of coronary artery disease notable for son (two heart attacks, and father). History of heart failure notable for no members. History of arrhythmia notable for no members.  ROS:   Please see the history of present illness.     EKGs/Labs/Other Studies Reviewed:    The following studies were reviewed today:  EKG:   07/04/22: SR rate 76 10/04/2020 SR rate 87, Low voltage (precordal leads)  10/12/2019- Sinus 74 No ST/T changes  Cardiac Studies & Procedures  ECHOCARDIOGRAM  ECHOCARDIOGRAM COMPLETE  12/26/2021  Narrative ECHOCARDIOGRAM REPORT    Patient Name:   EARLISHA SHARIF Date of Exam: 12/26/2021 Medical Rec #:  [id number]     Height:       65.0 in Accession #:    [id number]    Weight:       170.0 lb Date of Birth:  1931-04-16     BSA:          1.846 m Patient Age:    89 years      BP:           131/79 mmHg Patient Gender: F             HR:           75 bpm. Exam Location:  [address]  Procedure: 2D Echo, Cardiac Doppler and Color Doppler  Indications:     I37.1 Pulmonary valve insufficiency  History:         Patient has prior history of Echocardiogram examinations, most recent 11/01/2020. PVD, Signs/Symptoms:Chest Pain; Risk Factors:Hypertension.  Sonographer:     Clearence Ped RCS Referring Phys:  [id number] [facility] Izora Ribas Diagnosing Phys: Jodelle Red MD  IMPRESSIONS   1. Left ventricular ejection fraction, by estimation, is 60 to 65%. The left ventricle has normal function. The left ventricle has no regional wall motion abnormalities. There is moderate asymmetric left ventricular hypertrophy of the basal-septal segment. Left ventricular diastolic parameters are indeterminate. 2. Right ventricular systolic function is normal. The right ventricular size is normal. There is normal pulmonary artery systolic pressure. 3. Left atrial size was mildly dilated. 4. The mitral valve is normal in structure. No evidence of mitral valve regurgitation. No evidence of mitral stenosis. 5. The aortic valve is tricuspid. There is mild calcification of the aortic valve. Aortic valve regurgitation is not visualized. No aortic stenosis is present.  Comparison(s): Changes from prior study are noted. PR mild on current study.  Conclusion(s)/Recommendation(s): Otherwise normal echocardiogram, with minor abnormalities described in the report.  FINDINGS Left Ventricle: Left ventricular ejection fraction, by estimation, is 60 to 65%. The left ventricle has normal function. The  left ventricle has no regional wall motion abnormalities. The left ventricular internal cavity size was normal in size. There is moderate asymmetric left ventricular hypertrophy of the basal-septal segment. Left ventricular diastolic parameters are indeterminate.  Right Ventricle: The right ventricular size is normal. No increase in right ventricular wall thickness. Right ventricular systolic function is normal. There is normal pulmonary artery systolic pressure. The tricuspid regurgitant velocity is 2.52 m/s, and with an assumed right atrial pressure of 8 mmHg, the estimated right ventricular systolic pressure is 33.4 mmHg.  Left Atrium: Left atrial size was mildly dilated.  Right Atrium: Right atrial size was normal in size.  Pericardium: There is no evidence of pericardial effusion.  Mitral Valve: The mitral valve is normal in structure. No evidence of mitral valve regurgitation. No evidence of mitral valve stenosis.  Tricuspid Valve: The tricuspid valve is normal in structure. Tricuspid valve regurgitation is trivial. No evidence of tricuspid stenosis.  Aortic Valve: The aortic valve is tricuspid. There is mild calcification of the aortic valve. Aortic valve regurgitation is not visualized. No aortic stenosis is present.  Pulmonic Valve: The pulmonic valve was grossly normal. Pulmonic valve regurgitation is mild. No evidence of pulmonic stenosis.  Aorta: The aortic root, ascending aorta and aortic arch are all structurally normal, with no evidence of dilitation or obstruction.  Venous: The inferior  vena cava was not well visualized.  IAS/Shunts: The atrial septum is grossly normal.   LEFT VENTRICLE PLAX 2D LVIDd:         3.30 cm   Diastology LVIDs:         2.10 cm   LV e' medial:    6.53 cm/s LV PW:         1.10 cm   LV E/e' medial:  8.9 LV IVS:        1.50 cm   LV e' lateral:   3.37 cm/s LVOT diam:     2.00 cm   LV E/e' lateral: 17.3 LV SV:         69 LV SV Index:   38 LVOT  Area:     3.14 cm   RIGHT VENTRICLE RV Basal diam:  3.90 cm RV S prime:     12.70 cm/s RVSP:           28.4 mmHg  LEFT ATRIUM             Index        RIGHT ATRIUM           Index LA diam:        3.00 cm 1.63 cm/m   RA Pressure: 3.00 mmHg LA Vol (A2C):   53.8 ml 29.14 ml/m  RA Area:     8.98 cm LA Vol (A4C):   24.8 ml 13.43 ml/m  RA Volume:   15.30 ml  8.29 ml/m LA Biplane Vol: 39.9 ml 21.61 ml/m AORTIC VALVE LVOT Vmax:   82.20 cm/s LVOT Vmean:  54.800 cm/s LVOT VTI:    0.221 m  AORTA Ao Root diam: 3.10 cm Ao Asc diam:  3.20 cm  MITRAL VALVE                TRICUSPID VALVE MV Area (PHT):              TR Peak grad:   25.4 mmHg MV Decel Time:              TR Vmax:        252.00 cm/s MV E velocity: 58.20 cm/s   Estimated RAP:  3.00 mmHg MV A velocity: 105.00 cm/s  RVSP:           28.4 mmHg MV E/A ratio:  0.55 SHUNTS Systemic VTI:  0.22 m Systemic Diam: 2.00 cm  Jodelle Red MD Electronically signed by Jodelle Red MD Signature Date/Time: 12/26/2021/4:41:28 PM    Final (Updated)             Recent Labs: 11/16/2022: BUN 13; Creatinine, Ser 0.87; Hemoglobin 10.9; NT-Pro BNP 196; Platelets 254; Potassium 4.5; Sodium 138  Recent Lipid Panel    Component Value Date/Time   CHOL 179 07/17/2022 0800   TRIG 53 07/17/2022 0800   HDL 80 07/17/2022 0800   CHOLHDL 2.2 07/17/2022 0800   CHOLHDL 2 06/29/2013 1024   VLDL 10.2 06/29/2013 1024   LDLCALC 89 07/17/2022 0800     Physical Exam:    VS:  BP 134/78   Pulse 79   Ht 5\' 5"  (1.651 m)   Wt 207 lb (93.9 kg)   SpO2 95%   BMI 34.45 kg/m     Wt Readings from Last 3 Encounters:  07/26/23 207 lb (93.9 kg)  03/21/23 207 lb (93.9 kg)  11/07/22 199 lb 6.4 oz (90.4 kg)    Gen: no distress   Neck: No JVD Cardiac: No rubs  or gallops, soft systolic murmur, RRR +2 radial pulses Respiratory: Clear to auscultation bilaterally, normal effort, normal  respiratory rate GI: Soft, nontender, non-distended   MS: +1 painful bilateral edema;  moves all extremities Integument: Skin feels warm, shiny skin with hair loss in legs Neuro:  At time of evaluation, alert and oriented to person/place/time/situation  Psych: Normal affect, patient feels poorly  ASSESSMENT:    1. PAD (peripheral artery disease) (HCC)   2. Hyperlipidemia, unspecified hyperlipidemia type   3. Myalgia due to statin   4. Lower extremity edema   5. Iron deficiency anemia, unspecified iron deficiency anemia type   6. Constipation, unspecified constipation type     PLAN:    PAD Hyperlipidemia complicated by statin myalgias (off rosuvastatin) LE Edema Prior aneurysmal stroke - refill pravastatin 20 mg and zetia 10 mg - increase lasix to 40 mg PO daily; BMP and BNP in two weeks - her K is ULN at baseline; will not start K at this time  IDA and constipation - iron studies at lab draw, may be able to back off iron  HTN - controlled on current therapy  GERD - will controlled on her omeprazole; could not reach her APPl; will refill and her her f/u with her PCP AP   Will see out APP team 2 months; low threshold to return to [facility]    Medication Adjustments/Labs and Tests Ordered: Current medicines are reviewed at length with the patient today.  Concerns regarding medicines are outlined above.  Orders Placed This Encounter  Procedures   Basic metabolic panel   Pro b natriuretic peptide (BNP)   Anemia panel   Meds ordered this encounter  Medications   omeprazole (PRILOSEC) 40 MG capsule    Sig: Take 1 capsule (40 mg total) by mouth daily.    Dispense:  90 capsule    Refill:  1   pravastatin (PRAVACHOL) 20 MG tablet    Sig: Take 1 tablet (20 mg total) by mouth every evening.    Dispense:  90 tablet    Refill:  3   ezetimibe (ZETIA) 10 MG tablet    Sig: TAKE ONE TABLET BY MOUTH DAILY. MUST MAKE APPOINTMENT FOR FURTHER REFILLS    Dispense:  90 tablet    Refill:  3    Pt must keep upcoming appt for future refills  thank you   furosemide (LASIX) 40 MG tablet    Sig: Take 1 tablet (40 mg total) by mouth daily.    Dispense:  90 tablet    Refill:  3    Patient Instructions  Medication Instructions:  Your physician has recommended you make the following change in your medication:  INCREASE: furosemide (Lasix) to 40 mg by mouth once daily  REFILLED: omeprazole (1 time refill please have Primary care refill) REFILLED: Pravastatin and ezetimibe  *If you need a refill on your cardiac medications before your next appointment, please call your pharmacy*   Lab Work: IN 2 WEEKS: BMP, BNP, anemia panel  If you have labs (blood work) drawn today and your tests are completely normal, you will receive your results only by: MyChart Message (if you have MyChart) OR A paper copy in the mail If you have any lab test that is abnormal or we need to change your treatment, we will call you to review the results.   Testing/Procedures: NONE   Follow-Up: At [facility], you and your health needs are our priority.  As part of our continuing mission  to provide you with exceptional heart care, we have created designated Provider Care Teams.  These Care Teams include your primary Cardiologist (physician) and Advanced Practice Providers (APPs -  Physician Assistants and Nurse Practitioners) who all work together to provide you with the care you need, when you need it.   Your next appointment:   2-3 month(s)  Provider:   Jari Favre, PA-C, Ronie Spies, PA-C, Robin Searing, NP, Jacolyn Reedy, PA-C, Eligha Bridegroom, NP, Tereso Newcomer, PA-C, or Perlie Gold, PA-C        Signed, Christell Constant, MD  07/26/2023 11:03 AM    [facility]

## 2023-07-26 ENCOUNTER — Ambulatory Visit: Payer: Medicare Other | Attending: Internal Medicine | Admitting: Internal Medicine

## 2023-07-26 ENCOUNTER — Encounter: Payer: Self-pay | Admitting: Internal Medicine

## 2023-07-26 VITALS — BP 134/78 | HR 79 | Ht 65.0 in | Wt 207.0 lb

## 2023-07-26 DIAGNOSIS — R600 Localized edema: Secondary | ICD-10-CM

## 2023-07-26 DIAGNOSIS — D509 Iron deficiency anemia, unspecified: Secondary | ICD-10-CM

## 2023-07-26 DIAGNOSIS — K5900 Constipation, unspecified: Secondary | ICD-10-CM

## 2023-07-26 DIAGNOSIS — T466X5A Adverse effect of antihyperlipidemic and antiarteriosclerotic drugs, initial encounter: Secondary | ICD-10-CM

## 2023-07-26 DIAGNOSIS — T466X5D Adverse effect of antihyperlipidemic and antiarteriosclerotic drugs, subsequent encounter: Secondary | ICD-10-CM

## 2023-07-26 DIAGNOSIS — E785 Hyperlipidemia, unspecified: Secondary | ICD-10-CM | POA: Diagnosis not present

## 2023-07-26 DIAGNOSIS — I739 Peripheral vascular disease, unspecified: Secondary | ICD-10-CM

## 2023-07-26 DIAGNOSIS — M791 Myalgia, unspecified site: Secondary | ICD-10-CM

## 2023-07-26 MED ORDER — FUROSEMIDE 40 MG PO TABS: 40.0000 mg | ORAL_TABLET | Freq: Every day | ORAL | 3 refills | Status: DC

## 2023-07-26 MED ORDER — EZETIMIBE 10 MG PO TABS: ORAL_TABLET | ORAL | 3 refills | Status: DC

## 2023-07-26 MED ORDER — OMEPRAZOLE 40 MG PO CPDR: 40.0000 mg | DELAYED_RELEASE_CAPSULE | Freq: Every day | ORAL | 1 refills | Status: DC

## 2023-07-26 MED ORDER — PRAVASTATIN SODIUM 20 MG PO TABS: 20.0000 mg | ORAL_TABLET | Freq: Every evening | ORAL | 3 refills | Status: DC

## 2023-07-26 NOTE — Patient Instructions (Signed)
Medication Instructions:  Your physician has recommended you make the following change in your medication:  INCREASE: furosemide (Lasix) to 40 mg by mouth once daily  REFILLED: omeprazole (1 time refill please have Primary care refill) REFILLED: Pravastatin and ezetimibe  *If you need a refill on your cardiac medications before your next appointment, please call your pharmacy*   Lab Work: IN 2 WEEKS: BMP, BNP, anemia panel  If you have labs (blood work) drawn today and your tests are completely normal, you will receive your results only by: MyChart Message (if you have MyChart) OR A paper copy in the mail If you have any lab test that is abnormal or we need to change your treatment, we will call you to review the results.   Testing/Procedures: NONE   Follow-Up: At [facility], you and your health needs are our priority.  As part of our continuing mission to provide you with exceptional heart care, we have created designated Provider Care Teams.  These Care Teams include your primary Cardiologist (physician) and Advanced Practice Providers (APPs -  Physician Assistants and Nurse Practitioners) who all work together to provide you with the care you need, when you need it.   Your next appointment:   2-3 month(s)  Provider:   Jari Favre, PA-C, Ronie Spies, PA-C, Robin Searing, NP, Jacolyn Reedy, PA-C, Eligha Bridegroom, NP, Tereso Newcomer, PA-C, or Perlie Gold, PA-C

## 2023-08-14 ENCOUNTER — Ambulatory Visit: Payer: Medicare Other | Attending: Internal Medicine

## 2023-08-14 DIAGNOSIS — M791 Myalgia, unspecified site: Secondary | ICD-10-CM

## 2023-08-14 DIAGNOSIS — E785 Hyperlipidemia, unspecified: Secondary | ICD-10-CM

## 2023-08-14 DIAGNOSIS — D509 Iron deficiency anemia, unspecified: Secondary | ICD-10-CM

## 2023-08-14 DIAGNOSIS — I739 Peripheral vascular disease, unspecified: Secondary | ICD-10-CM

## 2023-08-14 DIAGNOSIS — R600 Localized edema: Secondary | ICD-10-CM

## 2023-08-14 DIAGNOSIS — K5900 Constipation, unspecified: Secondary | ICD-10-CM

## 2023-08-15 LAB — SPECIMEN STATUS REPORT

## 2023-08-17 LAB — ANEMIA PANEL
Ferritin: 115 ng/mL (ref 15–150)
Hematocrit: 35.8 % (ref 34.0–46.6)
Iron Saturation: 19 % (ref 15–55)
Iron: 45 ug/dL (ref 27–139)
Retic Ct Pct: 1.2 % (ref 0.6–2.6)
Total Iron Binding Capacity: 237 ug/dL — ABNORMAL LOW (ref 250–450)
UIBC: 192 ug/dL (ref 118–369)
Vitamin B-12: 482 pg/mL (ref 232–1245)

## 2023-08-17 LAB — BASIC METABOLIC PANEL
BUN/Creatinine Ratio: 21 (ref 12–28)
BUN: 18 mg/dL (ref 10–36)
CO2: 24 mmol/L (ref 20–29)
Calcium: 9 mg/dL (ref 8.7–10.3)
Chloride: 103 mmol/L (ref 96–106)
Creatinine, Ser: 0.85 mg/dL (ref 0.57–1.00)
Glucose: 83 mg/dL (ref 70–99)
Potassium: 4.2 mmol/L (ref 3.5–5.2)
Sodium: 139 mmol/L (ref 134–144)
eGFR: 65 mL/min/{1.73_m2} (ref >59)

## 2023-08-17 LAB — PRO B NATRIURETIC PEPTIDE: NT-Pro BNP: 446 pg/mL (ref 0–738)

## 2023-10-01 NOTE — Progress Notes (Deleted)
  Cardiology Office Note:  .   Date:  10/01/2023  ID:  Angela Wang, DOB 30-Sep-1931, MRN [id number] PCP: Gillian Scarce  [facility] Providers Cardiologist:  Christell Constant, MD {  History of Present Illness: .   Angela Wang is a 89 y.o. female with a PMH of hypertension, prior cerebral aneurysm 06/21/2007, PAD (seen by podiatry with ABIs 1.33 in both legs 2019) he who originally presented for palpitations 10/04/2020 here for follow-up appointment.  2022:  In interval; had moderate pulmonic insufficiency without RV dilation or dysfunction. 2023: Echo showed only mild PR; started pravastatin with plans to follow up today. 2024: Transition[facility] from [facility] to [facility]  When patient was seen in August and noted that she was doing poorly at that time.  Her stomach was not doing well from all these medications.  Significant constipation from and wanted to come off of some medications.  Felt better with the omeprazole.  Saw a [facility] APP and was told to come back if needed.  Luckily, no chest pain/pressure.  No DOE/SOB.  No PND/orthopnea.  She noted worsening of lower extremity edema.  Complicated because she is on diuretics and has issues getting to the bathroom on time.  Today, she***  ROS: ***  Studies Reviewed: .        *** Risk Assessment/Calculations:   {Does this patient have ATRIAL FIBRILLATION?:[phone]} No BP recorded.  {Refresh Note OR Click here to enter BP  :1}***       Physical Exam:   VS:  There were no vitals taken for this visit.   Wt Readings from Last 3 Encounters:  07/26/23 207 lb (93.9 kg)  03/21/23 207 lb (93.9 kg)  11/07/22 199 lb 6.4 oz (90.4 kg)    GEN: Well nourished, well developed in no acute distress NECK: No JVD; No carotid bruits CARDIAC: ***RRR, no murmurs, rubs, gallops RESPIRATORY:  Clear to auscultation without rales, wheezing or rhonchi  ABDOMEN: Soft, non-tender, non-distended EXTREMITIES:  No edema; No deformity    ASSESSMENT AND PLAN: .   1 PAD 2 HLD 3 myalgias due to statin 4 lower extremity edema 5 iron deficiency anemia 6 constipation    {Are you ordering a CV Procedure (e.g. stress test, cath, DCCV, TEE, etc)?   Press F2        :[id number]}  Dispo: ***  Signed, Sharlene Dory, PA-C

## 2023-10-02 ENCOUNTER — Ambulatory Visit: Payer: Medicare Other | Attending: Physician Assistant | Admitting: Physician Assistant

## 2023-10-02 DIAGNOSIS — I10 Essential (primary) hypertension: Secondary | ICD-10-CM

## 2023-10-02 DIAGNOSIS — D509 Iron deficiency anemia, unspecified: Secondary | ICD-10-CM

## 2023-10-02 DIAGNOSIS — R600 Localized edema: Secondary | ICD-10-CM

## 2023-10-02 DIAGNOSIS — K5900 Constipation, unspecified: Secondary | ICD-10-CM

## 2023-10-02 DIAGNOSIS — I739 Peripheral vascular disease, unspecified: Secondary | ICD-10-CM

## 2023-10-02 DIAGNOSIS — M791 Myalgia, unspecified site: Secondary | ICD-10-CM

## 2023-10-14 ENCOUNTER — Encounter: Payer: Self-pay | Admitting: Neurology

## 2023-11-13 ENCOUNTER — Other Ambulatory Visit: Payer: Self-pay | Admitting: Physician Assistant
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
## 2023-12-26 ENCOUNTER — Encounter: Payer: Self-pay | Admitting: Neurology

## 2023-12-26 ENCOUNTER — Ambulatory Visit: Payer: Medicare Other | Admitting: Neurology

## 2023-12-26 VITALS — BP 132/78 | HR 88 | Ht 65.5 in | Wt 177.0 lb

## 2023-12-26 DIAGNOSIS — R209 Unspecified disturbances of skin sensation: Secondary | ICD-10-CM

## 2023-12-26 DIAGNOSIS — L853 Xerosis cutis: Secondary | ICD-10-CM

## 2023-12-26 DIAGNOSIS — G629 Polyneuropathy, unspecified: Secondary | ICD-10-CM

## 2023-12-26 DIAGNOSIS — R269 Unspecified abnormalities of gait and mobility: Secondary | ICD-10-CM | POA: Diagnosis not present

## 2023-12-26 MED ORDER — DULOXETINE HCL 30 MG PO CPEP
30.0000 mg | ORAL_CAPSULE | Freq: Every day | ORAL | 5 refills | Status: DC
Start: 2023-12-26 — End: 2024-07-15

## 2023-12-26 NOTE — Patient Instructions (Signed)
I saw you today because of problems with walking. I think it is a combination of arthritis, knee problems/pain, and nerve damage (neuropathy).  I want to check some labs today.  I am prescribing Cymbalta 30 mg daily to try to help with your nerve pain.  I am ordering a wheelchair/transfer chair to help you get around.  I am referring you to dermatology for the skin issues on your legs.  I will be in touch when I have your results.  I will see you back in clinic in 6 months or sooner if needed.  Please let me know if you have any questions or concerns in the meantime.  The physicians and staff at [facility] are committed to providing excellent care. You may receive a survey requesting feedback about your experience at our office. We strive to receive "very good" responses to the survey questions. If you feel that your experience would prevent you from giving the office a "very good " response, please contact our office to try to remedy the situation. We may be reached at [phone]. Thank you for taking the time out of your busy day to complete the survey.  Jacquelyne Balint, MD [facility]  Preventing Falls at [facility] are common, often dreaded events in the lives of older people. Aside from the obvious injuries and even death that may result, fall can cause wide-ranging consequences including loss of independence, mental decline, decreased activity and mobility. Younger people are also at risk of falling, especially those with chronic illnesses and fatigue.  Ways to reduce risk for falling Examine diet and medications. Warm foods and alcohol dilate blood vessels, which can lead to dizziness when standing. Sleep aids, antidepressants and pain medications can also increase the likelihood of a fall.  Get a vision exam. Poor vision, cataracts and glaucoma increase the chances of falling.  Check foot gear. Shoes should fit snugly and have a sturdy, nonskid sole and a broad, low  heel  Participate in a physician-approved exercise program to build and maintain muscle strength and improve balance and coordination. Programs that use ankle weights or stretch bands are excellent for muscle-strengthening. Water aerobics programs and low-impact Tai Chi programs have also been shown to improve balance and coordination.  Increase vitamin D intake. Vitamin D improves muscle strength and increases the amount of calcium the body is able to absorb and deposit in bones.  How to prevent falls from common hazards Floors - Remove all loose wires, cords, and throw rugs. Minimize clutter. Make sure rugs are anchored and smooth. Keep furniture in its usual place.  Chairs -- Use chairs with straight backs, armrests and firm seats. Add firm cushions to existing pieces to add height.  Bathroom - Install grab bars and non-skid tape in the tub or shower. Use a bathtub transfer bench or a shower chair with a back support Use an elevated toilet seat and/or safety rails to assist standing from a low surface. Do not use towel racks or bathroom tissue holders to help you stand.  Lighting - Make sure halls, stairways, and entrances are well-lit. Install a night light in your bathroom or hallway. Make sure there is a light switch at the top and bottom of the staircase. Turn lights on if you get up in the middle of the night. Make sure lamps or light switches are within reach of the bed if you have to get up during the night.  Kitchen - Install non-skid rubber mats near the  sink and stove. Clean spills immediately. Store frequently used utensils, pots, pans between waist and eye level. This helps prevent reaching and bending. Sit when getting things out of lower cupboards.  Living room/ Bedrooms - Place furniture with wide spaces in between, giving enough room to move around. Establish a route through the living room that gives you something to hold onto as you walk.  Stairs - Make sure treads, rails, and  rugs are secure. Install a rail on both sides of the stairs. If stairs are a threat, it might be helpful to arrange most of your activities on the lower level to reduce the number of times you must climb the stairs.  Entrances and doorways - Install metal handles on the walls adjacent to the doorknobs of all doors to make it more secure as you travel through the doorway.  Tips for maintaining balance Keep at least one hand free at all times. Try using a backpack or fanny pack to hold things rather than carrying them in your hands. Never carry objects in both hands when walking as this interferes with keeping your balance.  Attempt to swing both arms from front to back while walking. This might require a conscious effort if Parkinson's disease has diminished your movement. It will, however, help you to maintain balance and posture, and reduce fatigue.  Consciously lift your feet off of the ground when walking. Shuffling and dragging of the feet is a common culprit in losing your balance.  When trying to navigate turns, use a "U" technique of facing forward and making a wide turn, rather than pivoting sharply.  Try to stand with your feet shoulder-length apart. When your feet are close together for any length of time, you increase your risk of losing your balance and falling.  Do one thing at a time. Don't try to walk and accomplish another task, such as reading or looking around. The decrease in your automatic reflexes complicates motor function, so the less distraction, the better.  Do not wear rubber or gripping soled shoes, they might "catch" on the floor and cause tripping.  Move slowly when changing positions. Use deliberate, concentrated movements and, if needed, use a grab bar or walking aid. Count 15 seconds between each movement. For example, when rising from a seated position, wait 15 seconds after standing to begin walking.  If balance is a continuous problem, you might want to  consider a walking aid such as a cane, walking stick, or walker. Once you've mastered walking with help, you might be ready to try it on your own again.

## 2023-12-26 NOTE — Addendum Note (Signed)
Addended by: Lenise Herald on: 12/26/2023 04:13 PM   Modules accepted: Orders

## 2023-12-31 ENCOUNTER — Ambulatory Visit (INDEPENDENT_AMBULATORY_CARE_PROVIDER_SITE_OTHER): Payer: Medicare Other | Admitting: Family

## 2023-12-31 ENCOUNTER — Encounter: Payer: Self-pay | Admitting: Family

## 2023-12-31 VITALS — BP 134/72 | HR 73 | Temp 97.6°F | Resp 17 | Ht 65.5 in | Wt 181.1 lb

## 2023-12-31 DIAGNOSIS — K581 Irritable bowel syndrome with constipation: Secondary | ICD-10-CM

## 2023-12-31 DIAGNOSIS — I872 Venous insufficiency (chronic) (peripheral): Secondary | ICD-10-CM | POA: Diagnosis not present

## 2023-12-31 DIAGNOSIS — R2681 Unsteadiness on feet: Secondary | ICD-10-CM

## 2023-12-31 DIAGNOSIS — K219 Gastro-esophageal reflux disease without esophagitis: Secondary | ICD-10-CM | POA: Diagnosis not present

## 2023-12-31 DIAGNOSIS — I10 Essential (primary) hypertension: Secondary | ICD-10-CM

## 2023-12-31 DIAGNOSIS — F411 Generalized anxiety disorder: Secondary | ICD-10-CM

## 2023-12-31 DIAGNOSIS — H6123 Impacted cerumen, bilateral: Secondary | ICD-10-CM

## 2023-12-31 DIAGNOSIS — I739 Peripheral vascular disease, unspecified: Secondary | ICD-10-CM | POA: Diagnosis not present

## 2023-12-31 MED ORDER — ALPRAZOLAM 0.5 MG PO TABS: 0.5000 mg | ORAL_TABLET | Freq: Every evening | ORAL | 3 refills | Status: DC | PRN

## 2023-12-31 MED ORDER — DEBROX 6.5 % OT SOLN: 5.0000 [drp] | Freq: Two times a day (BID) | OTIC | 0 refills | Status: AC

## 2023-12-31 NOTE — Patient Instructions (Signed)
-  Instill debrox 6.5 otic solution 5 drops into each ear twice daily x 4 days then follow up for ear lavage.May apply cotton ball at bedtime to prevent drainage to pillow.

## 2023-12-31 NOTE — Progress Notes (Signed)
 Provider: Roxan Tresha Muzio FNP-C   Boneta, [place]  E, PA-C  Patient Care Team: Fulbright, [place]  E, PA-C as PCP - General (Family Medicine) Santo Stanly LABOR, MD as PCP - Cardiology (Cardiology)  Extended Emergency Contact Information Primary Emergency Contact: Bingham,Adrian  [place] Home Phone: [phone] Mobile Phone: [phone] Relation: Son Secondary Emergency Contact: Owens Cones Address: [address]          [place], [place] [address] [place] Home Phone: [phone] Work Phone: [phone] Relation: Daughter  Code Status:  Full Code  Goals of care: Advanced Directive information    12/31/2023    2:10 PM  Advanced Directives  Does Patient Have a Medical Advance Directive? Yes  Type of Estate Agent of [place];Living will;Out of facility DNR (pink MOST or yellow form)  Does patient want to make changes to medical advance directive? No - Patient declined     Chief Complaint  Patient presents with   New Patient (Initial Visit)    Patient is here to establish care    Discussed the use of AI scribe software for clinical note transcription with the patient, who gave verbal consent to proceed.  History of Present Illness   Angela Wang is a 89 year old female with Hypertension, Irritable bowel syndrome, Iron deficiency anemia, chronic venous insufficiency, Peripheral Artery disease,GERD, Chronic sinusitis allergies, neuropathy and anxiety who presents to establish care with provider. She is accompanied by her daughter, who is currently living with her and assisting with Cooking and cleaning.  She has a long-standing history of anxiety, describing it as persistent throughout her life. She has been taking Xanax  0.5 mg three times daily as needed though takes only at bedtime but is currently out of the medication. Xanax  helps with her stomach and overall well-being. She has tried Cymbalta   (duloxetine ) but cannot tolerate it due to stomach issues. Valium  has been used in the past, which helped to calm her.  She experiences neuropathy, primarily affecting her legs and feet, causing significant discomfort. Her nerves are described as 'really bad,' with the condition worsening over time. She experiences pain in her legs and feet but denies any open wounds. She uses triamcinolone  cream and Lindex 0.05% daily for her skin, and lidocaine  ointment occasionally for itching in the vaginal area.  She has a history of acid reflux and has been taking omeprazole  40 mg daily for many years. She describes having a 'nervous stomach' and a spastic colon. She has been anemic all her life and takes iron supplements as needed. She also takes a multivitamin, vitamin C 500 mg daily, and folic acid.  She experiences sinus issues and allergies, for which she uses promethazine  syrup, Afarin nasal spray, and Flonase. She takes furosemide  as needed for leg swelling, which she attributes to fluid retention. No potassium supplements are taken with furosemide .  She has a history of a stroke in 2000, which left her with weakness on the right side. She has been advised to take Plavix  by one doctor but was told not to by another, so she has not been taking it. She also takes pravastatin  20 mg daily and Zetia  for cholesterol management.  She reports a history of irritable bowel syndrome with constipation, for which she takes Linzess  145 mcg as needed and Dulcolax. She also takes a B complex vitamin and calcium , magnesium, and vitamin D  supplements.  She mentions having had an aneurysm in 2000 and experiences occasional thyroid  swelling, especially during sinus problems, but  has never taken thyroid  medication. She points right back of the neck as the thyroid  swelling.   she walks with a Rolling walker. No recent fall episodes  appetite described as good. No weight loss reported.            Past Medical History:   Diagnosis Date   ALLERGIC RHINITIS 06/21/2007   Qualifier: Diagnosis of  By: Georgian ROSALEA CHARM Lamar    Allergy    allergic rhinitis   Anemia    nos   ANEMIA-NOS 06/21/2007   Qualifier: Diagnosis of  By: Georgian ROSALEA CHARM Lamar    ANXIETY 06/21/2007   Qualifier: Diagnosis of  By: Georgian ROSALEA CHARM Lamar    Arthritis    osteoarthritis   Atypical chest pain 09/2008   negative myoview   BACK PAIN, LUMBAR 03/06/2010   Qualifier: Diagnosis of  By: Georgian ROSALEA CHARM Lamar    CEREBRAL ANEURYSM 06/21/2007   Qualifier: History of  By: Georgian ROSALEA CHARM Lamar    CEREBROVASCULAR ACCIDENT, HX OF 06/21/2007   Qualifier: Diagnosis of  By: Georgian ROSALEA CHARM Lamar    CHEST PAIN, ATYPICAL 09/02/2008   Qualifier: Diagnosis of  By: Georgian ROSALEA CHARM Lamar    Chronic sinusitis    CONSTIPATION, CHRONIC 09/20/2009   Qualifier: Diagnosis of  By: Obie MD, Princella HERO    Depression    DEPRESSION 06/21/2007   Qualifier: Diagnosis of  By: Georgian ROSALEA CHARM Lamar    Diverticulosis    DIVERTICULOSIS-COLON 06/09/2008   Qualifier: Diagnosis of  By: Kerman NP, Vina     Fibromyalgia    FIBROMYALGIA 06/21/2007   Qualifier: Diagnosis of  By: Georgian ROSALEA CHARM Lamar SERENE, HX OF 08/25/2008   Qualifier: Diagnosis of  By: Nelson-Smith CMA (AAMA), Dottie     GERD 05/03/2008   Qualifier: Diagnosis of  By: Latisha CMA, Leigh     GERD (gastroesophageal reflux disease)    Headache 03/15/2011   History of solitary pulmonary nodule    right, stable by CT   Hyperlipidemia    HYPERLIPIDEMIA 06/21/2007   Qualifier: Diagnosis of  By: Georgian ROSALEA CHARM Lamar    Hypertension    HYPERTENSION 06/21/2007   Qualifier: Diagnosis of  By: Georgian ROSALEA CHARM Lamar    HYPOGLYCEMIA 10/24/2010   Qualifier: Diagnosis of  By: Georgian ROSALEA CHARM Lamar    HYPOTHYROIDISM 07/22/2008   Qualifier: Diagnosis of  By: Georgian ROSALEA CHARM Lamar    IBS 06/21/2007   Qualifier: Diagnosis of  By: Georgian ROSALEA CHARM Lamar    IBS (irritable bowel syndrome)    INSOMNIA, CHRONIC 07/19/2009   Qualifier: Diagnosis of  By: Georgian ROSALEA CHARM Lamar     LEG PAIN, RIGHT 02/01/2009   Qualifier: Diagnosis of  By: Georgian ROSALEA CHARM Lamar    Leukopenia    chronic   LEUKOPENIA, CHRONIC 06/21/2007   Qualifier: Diagnosis of  By: Georgian ROSALEA CHARM Lamar    NECK PAIN 02/03/2008   Qualifier: Diagnosis of  By: Georgian ROSALEA CHARM Lamar    NUMBNESS 01/29/2008   Qualifier: Diagnosis of  By: Kassie MD, Alyce DELENA Pyles NOS-Unspec 06/21/2007   Centricity Description: DEGENERATIVE JOINT DISEASE, MILD Qualifier: Diagnosis of  By: Georgian ROSALEA CHARM Lamar  Centricity Description: OSTEOARTHRITIS Qualifier: Diagnosis of  By: Georgian ROSALEA CHARM Lamar    PULMONARY NODULE 12/26/2007   Qualifier: Diagnosis of  By: Georgian ROSALEA CHARM Lamar    Routine general medical examination at a health  care facility    SCIATICA, RIGHT 05/06/2008   Qualifier: Diagnosis of  By: Vicenta CMA, Sarah     Stroke [facility])    THROMBOCYTOPENIA 02/09/2009   Qualifier: Diagnosis of  By: Georgian ROSALEA CHARM Lamar    UNSTEADY GAIT 07/28/2009   Qualifier: Diagnosis of  By: Georgian ROSALEA CHARM Lamar    VENOUS INSUFFICIENCY 05/03/2008   Qualifier: Diagnosis of  By: Latisha CMA, Anette     Past Surgical History:  Procedure Laterality Date   ABDOMINAL HYSTERECTOMY     ANEURYSM COILING     APPENDECTOMY     CHOLECYSTECTOMY     JOINT REPLACEMENT     total right knee replacement   KNEE SURGERY      Allergies  Allergen Reactions   Amitriptyline Hcl Swelling and Other (See Comments)    Headache   Cyclobenzaprine  Swelling   Escitalopram Oxalate     REACTION: Hallucinations  Other Reaction(s): Mental Status Changes, Psychosis   Tramadol  Nausea And Vomiting and Other (See Comments)    Other Reaction(s): GI Intolerance   Furosemide  Nausea And Vomiting    Abdominal pain  Other Reaction(s): GI Intolerance, Other (See Comments)   Hydrocodone Other (See Comments)    Other Reaction(s): GI Intolerance   Hydrocodone-Acetaminophen      REACTION: stomach upset   Other Swelling    Other reaction(s): Other (See Comments) ANASTHESIA    Oxycodone -Acetaminophen      REACTION: Nausea and upset stomach   Terbinafine Other (See Comments)   Aspirin Other (See Comments)    REACTION: stomach upset   Baclofen Other (See Comments)    Other Reaction(s): GI Intolerance   Citalopram Other (See Comments)    Other Reaction(s): GI Intolerance   Codeine  Nausea Only and Other (See Comments)    REACTION: stomach upset  Other Reaction(s): GI Intolerance   Diazepam      Other Reaction(s): GI Intolerance   Escitalopram Oxalate     Other Reaction(s): GI Intolerance   Gabapentin      GI Upset  Other Reaction(s): GI Intolerance, Other (See Comments)   Hydrocodone-Acetaminophen      Other Reaction(s): GI Intolerance  Headache   Lactulose Other (See Comments)    Passed out  Other Reaction(s): Other (See Comments)   Latex Rash    REACTION: rash REACTION: rash REACTION: rash   Levofloxacin Nausea And Vomiting and Nausea Only    Other Reaction(s): GI Intolerance, Other (See Comments)   Lorazepam Other (See Comments)    REACTION: Throat Swelling  Other Reaction(s): GI Intolerance, Other (See Comments)  Other reaction(s): Other (See Comments), REACTION: Throat Swelling   Moxifloxacin Nausea And Vomiting and Nausea Only    Other Reaction(s): GI Intolerance, Other (See Comments)   Oxycodone  Other (See Comments)    Headache, change in mental status  Other Reaction(s): Mental Status Changes, Other (See Comments), Psychosis  Headache   Pantoprazole Nausea Only and Other (See Comments)    Other Reaction(s): GI Intolerance, Other (See Comments)   Polyethylene Glycol 3350 Other (See Comments)    Makes her bloated  Other Reaction(s): GI Intolerance   Rabeprazole Nausea Only and Other (See Comments)    Other Reaction(s): GI Intolerance   Terfenadine     REACTION: stomach upset  Other Reaction(s): GI Intolerance    Allergies as of 12/31/2023       Reactions   Amitriptyline Hcl Swelling, Other (See Comments)   Headache    Cyclobenzaprine  Swelling   Escitalopram Oxalate    REACTION: Hallucinations Other Reaction(s):  Mental Status Changes, Psychosis   Tramadol  Nausea And Vomiting, Other (See Comments)   Other Reaction(s): GI Intolerance   Furosemide  Nausea And Vomiting   Abdominal pain Other Reaction(s): GI Intolerance, Other (See Comments)   Hydrocodone Other (See Comments)   Other Reaction(s): GI Intolerance   Hydrocodone-acetaminophen     REACTION: stomach upset   Other Swelling   Other reaction(s): Other (See Comments) ANASTHESIA   Oxycodone -acetaminophen     REACTION: Nausea and upset stomach   Terbinafine Other (See Comments)   Aspirin Other (See Comments)   REACTION: stomach upset   Baclofen Other (See Comments)   Other Reaction(s): GI Intolerance   Citalopram Other (See Comments)   Other Reaction(s): GI Intolerance   Codeine  Nausea Only, Other (See Comments)   REACTION: stomach upset Other Reaction(s): GI Intolerance   Diazepam     Other Reaction(s): GI Intolerance   Escitalopram Oxalate    Other Reaction(s): GI Intolerance   Gabapentin     GI Upset Other Reaction(s): GI Intolerance, Other (See Comments)   Hydrocodone-acetaminophen     Other Reaction(s): GI Intolerance Headache   Lactulose Other (See Comments)   Passed out Other Reaction(s): Other (See Comments)   Latex Rash   REACTION: rash REACTION: rash REACTION: rash   Levofloxacin Nausea And Vomiting, Nausea Only   Other Reaction(s): GI Intolerance, Other (See Comments)   Lorazepam Other (See Comments)   REACTION: Throat Swelling Other Reaction(s): GI Intolerance, Other (See Comments) Other reaction(s): Other (See Comments), REACTION: Throat Swelling   Moxifloxacin Nausea And Vomiting, Nausea Only   Other Reaction(s): GI Intolerance, Other (See Comments)   Oxycodone  Other (See Comments)   Headache, change in mental status Other Reaction(s): Mental Status Changes, Other (See Comments), Psychosis Headache   Pantoprazole  Nausea Only, Other (See Comments)   Other Reaction(s): GI Intolerance, Other (See Comments)   Polyethylene Glycol 3350 Other (See Comments)   Makes her bloated Other Reaction(s): GI Intolerance   Rabeprazole Nausea Only, Other (See Comments)   Other Reaction(s): GI Intolerance   Terfenadine    REACTION: stomach upset Other Reaction(s): GI Intolerance        Medication List        Accurate as of December 31, 2023  2:18 PM. If you have any questions, ask your nurse or doctor.          ALPRAZolam  0.5 MG tablet Commonly known as: XANAX  Take 0.5 mg by mouth 3 (three) times daily as needed for sleep or anxiety.   amoxicillin -clavulanate 875-125 MG tablet Commonly known as: AUGMENTIN  Take 1 tablet by mouth 2 (two) times daily.   ascorbic acid 500 MG tablet Commonly known as: VITAMIN C Take 500 mg by mouth.   B COMPLEX 100 PO Take 1 tablet by mouth daily.   Calcium -Magnesium-Vitamin D  500-250-200 MG-MG-UNIT Tabs Take 1 tablet by mouth daily.   clopidogrel  75 MG tablet Commonly known as: PLAVIX  Take 1 tablet (75 mg total) by mouth daily.   co-enzyme Q-10 30 MG capsule Take 30 mg by mouth 1 day or 1 dose.   cyclobenzaprine  10 MG tablet Commonly known as: FLEXERIL  Take 1 tablet (10 mg total) by mouth 2 (two) times daily as needed for muscle spasms.   diclofenac  Sodium 1 % Gel Commonly known as: VOLTAREN  Apply 4 g topically 4 (four) times daily.   Dulcolax 5 MG EC tablet Generic drug: bisacodyl as needed for moderate constipation or severe constipation.   DULoxetine  30 MG capsule Commonly known as: Cymbalta  Take 1 capsule (30 mg  total) by mouth daily.   ezetimibe  10 MG tablet Commonly known as: ZETIA  TAKE ONE TABLET BY MOUTH DAILY. MUST MAKE APPOINTMENT FOR FURTHER REFILLS   fluocinonide  cream 0.05 % Commonly known as: LIDEX  every other day.   fluticasone 50 MCG/ACT nasal spray Commonly known as: FLONASE Place 2 sprays into both nostrils as needed for  allergies or rhinitis.   folic acid 1 MG tablet Commonly known as: FOLVITE Take 1 mg by mouth daily.   furosemide  40 MG tablet Commonly known as: LASIX  Take 1 tablet (40 mg total) by mouth daily. What changed:  when to take this reasons to take this   ipratropium 0.03 % nasal spray Commonly known as: ATROVENT as needed for congestion.   iron polysaccharides 150 MG capsule Commonly known as: NIFEREX Take 150 mg by mouth daily.   lidocaine  5 % ointment Commonly known as: XYLOCAINE  Apply topically.   Linzess  145 MCG Caps capsule Generic drug: linaclotide  Take 145 mcg by mouth as needed (irritable bowel).   Multi-Vitamin tablet Take 1 tablet by mouth daily.   Olopatadine HCl 0.2 % Soln Apply 1 drop to eye daily.   omeprazole  40 MG capsule Commonly known as: PRILOSEC Take 1 capsule (40 mg total) by mouth daily.   pravastatin  20 MG tablet Commonly known as: PRAVACHOL  Take 1 tablet (20 mg total) by mouth every evening.   promethazine -dextromethorphan 6.25-15 MG/5ML syrup Commonly known as: PROMETHAZINE -DM Take 5 mLs by mouth as directed.   traMADol  50 MG tablet Commonly known as: ULTRAM  Take 1 tablet (50 mg total) by mouth every 12 (twelve) hours as needed.   triamcinolone  cream 0.1 % Commonly known as: KENALOG  MIX WITH CETAPHIL MOISTURIZING CREAM. APPLY THREE TIMES DAILY AS NEEDED        Review of Systems  Constitutional:  Negative for appetite change, chills, fatigue, fever and unexpected weight change.  HENT:  Negative for congestion, dental problem, ear discharge, ear pain, facial swelling, hearing loss, nosebleeds, postnasal drip, rhinorrhea, sinus pressure, sinus pain, sneezing, sore throat, tinnitus and trouble swallowing.   Eyes:  Negative for pain, discharge, redness, itching and visual disturbance.  Respiratory:  Negative for cough, chest tightness, shortness of breath and wheezing.   Cardiovascular:  Negative for chest pain, palpitations and leg  swelling.  Gastrointestinal:  Negative for abdominal distention, abdominal pain, blood in stool, constipation, diarrhea, nausea and vomiting.  Endocrine: Negative for cold intolerance, heat intolerance, polydipsia, polyphagia and polyuria.  Genitourinary:  Negative for difficulty urinating, dysuria, flank pain, frequency and urgency.  Musculoskeletal:  Positive for arthralgias and gait problem. Negative for back pain, joint swelling, myalgias, neck pain and neck stiffness.  Skin:  Negative for color change, pallor, rash and wound.  Neurological:  Negative for dizziness, syncope, speech difficulty, weakness, light-headedness, numbness and headaches.  Hematological:  Does not bruise/bleed easily.  Psychiatric/Behavioral:  Positive for sleep disturbance. Negative for agitation, behavioral problems, confusion, hallucinations, self-injury and suicidal ideas. The patient is nervous/anxious.     Immunization History  Administered Date(s) Administered   Influenza Whole 09/12/2008, 09/12/2009, 08/15/2010   Influenza, High Dose Seasonal PF 08/26/2019   Influenza-Unspecified 08/26/2023   Moderna Sars-Covid-2 Vaccination 10/18/2020, 05/08/2021   PFIZER(Purple Top)SARS-COV-2 Vaccination 01/01/2020, 01/22/2020   PNEUMOCOCCAL CONJUGATE-20 08/26/2023   Pfizer Covid-19 Vaccine Bivalent Booster 71yrs & up 12/06/2021, 10/31/2022   Pneumococcal Conjugate-13 12/03/2013   Pneumococcal Polysaccharide-23 11/13/1999, 10/16/2004, 01/11/2015   Pneumococcal-Unspecified 12/03/2013   Smallpox 09/12/1964   Td 02/24/2004   Tdap 08/21/2017   Zoster Recombinant(Shingrix)  08/26/2019, 11/07/2019   Zoster, Live 11/26/2012   Pertinent  Health Maintenance Due  Topic Date Due   DEXA SCAN  Never done   INFLUENZA VACCINE  Completed      11/25/2020   12:17 PM 05/22/2021   10:32 AM 12/20/2022   10:48 AM 12/26/2023   12:55 PM 12/31/2023    2:09 PM  Fall Risk  Falls in the past year?   0 0 0  Was there an injury with Fall?    0 0 0  Fall Risk Category Calculator   0 0 0  (RETIRED) Patient Fall Risk Level High fall risk Low fall risk     Patient at Risk for Falls Due to     Impaired balance/gait;Impaired mobility  Fall risk Follow up     Falls evaluation completed   Functional Status Survey:    Vitals:   12/31/23 1412  BP: 134/72  Pulse: 73  Resp: 17  Temp: 97.6 F (36.4 C)  TempSrc: Temporal  SpO2: 98%  Weight: 181 lb 1.6 oz (82.1 kg)  Height: 5' 5.5 (1.664 m)   Body mass index is 29.68 kg/m. Physical Exam Vitals reviewed.  Constitutional:      General: She is not in acute distress.    Appearance: Normal appearance. She is normal weight. She is not ill-appearing or diaphoretic.  HENT:     Head: Normocephalic.     Right Ear: There is impacted cerumen.     Left Ear: There is impacted cerumen.     Nose: Nose normal. No congestion or rhinorrhea.     Mouth/Throat:     Mouth: Mucous membranes are moist.     Pharynx: Oropharynx is clear. No oropharyngeal exudate or posterior oropharyngeal erythema.  Eyes:     General: No scleral icterus.       Right eye: No discharge.        Left eye: No discharge.     Extraocular Movements: Extraocular movements intact.     Conjunctiva/sclera: Conjunctivae normal.     Pupils: Pupils are equal, round, and reactive to light.  Neck:     Vascular: No carotid bruit.  Cardiovascular:     Rate and Rhythm: Normal rate and regular rhythm.     Pulses: Normal pulses.     Heart sounds: Normal heart sounds. No murmur heard.    No friction rub. No gallop.  Pulmonary:     Effort: Pulmonary effort is normal. No respiratory distress.     Breath sounds: Normal breath sounds. No wheezing, rhonchi or rales.  Chest:     Chest wall: No tenderness.  Abdominal:     General: Bowel sounds are normal. There is no distension.     Palpations: Abdomen is soft. There is no mass.     Tenderness: There is no abdominal tenderness. There is no right CVA tenderness, left CVA tenderness,  guarding or rebound.  Musculoskeletal:        General: No swelling or tenderness. Normal range of motion.     Cervical back: Normal range of motion. No rigidity or tenderness.     Right lower leg: No edema.     Left lower leg: No edema.  Lymphadenopathy:     Cervical: No cervical adenopathy.  Skin:    General: Skin is warm and dry.     Coloration: Skin is not pale.     Findings: No bruising, erythema, lesion or rash.     Comments: Bilateral lower extremities dark dry thick  hyperpigmentation skin   Neurological:     Mental Status: She is alert and oriented to person, place, and time.     Cranial Nerves: No cranial nerve deficit.     Motor: No weakness.     Coordination: Coordination normal.     Gait: Gait abnormal.     Comments: Decreased monofilament sensation to both feet   Psychiatric:        Mood and Affect: Mood normal.        Speech: Speech normal.        Behavior: Behavior normal.        Thought Content: Thought content normal.        Judgment: Judgment normal.     Labs reviewed: Recent Labs    08/14/23 1130  NA 139  K 4.2  CL 103  CO2 24  GLUCOSE 83  BUN 18  CREATININE 0.85  CALCIUM  9.0   No results for input(s): AST, ALT, ALKPHOS, BILITOT, PROT, ALBUMIN in the last 8760 hours. Recent Labs    08/14/23 1130  HCT 35.8   Lab Results  Component Value Date   TSH 1.37 06/29/2013   Lab Results  Component Value Date   HGBA1C 6.0 (H) 12/10/2016   Lab Results  Component Value Date   CHOL 179 07/17/2022   HDL 80 07/17/2022   LDLCALC 89 07/17/2022   TRIG 53 07/17/2022   CHOLHDL 2.2 07/17/2022    Significant Diagnostic Results in last 30 days:  No results found.  Assessment/Plan  Neuropathy Chronic neuropathy primarily affecting the legs and feet, causing significant discomfort and contributing to anxiety and depression. Unable to tolerate tramadol  due to gastrointestinal side effects. - Continue current medications for neuropathy -  Follow up with neurologist as needed  Anxiety and Depression Long-standing anxiety and depression, exacerbated by neuropathy. Prefers Xanax  over other medications for anxiety and depression due to gastrointestinal side effects with Cymbalta  (duloxetine ). - Prescribe Xanax  0.5 mg once daily at bedtime - Discuss the use of sertraline for both anxiety and depression but declined  - Sign a Xanax  contract and perform a drug test.PDMP reviewed.Xanax  side effects discussed with high risk for fall due to advance age.  - Follow up in six months  Chronic Constipation Chronic constipation managed with Linzess  145 mcg and Dulcolax as needed. Omeprazole  40 mg daily for acid reflux may contribute to gastrointestinal symptoms. Advised to switch omeprazole  to as needed after 12 weeks to prevent mineral and vitamin malabsorption. - Continue Linzess  145 mcg as needed - Continue Dulcolax as needed - Recommend daily probiotic - Switch omeprazole  to as needed after 12 weeks  Irritable Bowel Syndrome (IBS) IBS with constipation. Reports a 'nervous stomach' and takes omeprazole  for acid reflux. - Continue omeprazole  40 mg daily - Recommend daily probiotic  Chronic Sinusitis and Allergies Managed with promethazine  as needed, Afrin nasal spray, and Flonase. Reports severe sinus issues and occasional neck swelling.No swelling during visit  - Continue promethazine  as needed - Continue Afrin nasal spray - Continue Flonase  Peripheral Edema Managed with furosemide  as needed for leg swelling. Reports no current leg swelling. Does not take potassium supplementation. - Continue furosemide  as needed - Monitor weight and leg swelling at home - Encourage low-salt diet  Anemia Long-standing anemia managed with iron supplementation.Asymptomatic  - Order CBC to monitor anemia - Continue iron supplementation as needed  Hyperlipidemia Managed with pravastatin  20 mg daily and Zetia . No recent cholesterol levels  checked. - Order fasting lipid panel - Continue  pravastatin  20 mg daily - Continue Zetia   Hypertension Blood pressure today was 134/72 mmHg, within normal range. Does not regularly monitor blood pressure at home. - Encourage regular blood pressure monitoring at home  Bilateral impacted cerumen Bilateral TM not visualized due to cerumen impaction  - carbamide peroxide (DEBROX) 6.5 % OTIC solution; Place 5 drops into both ears 2 (two) times daily for 4 days.  Dispense: 2 mL; Refill: 0  General Health Maintenance Has not had recent blood work for cholesterol and other routine screenings. Last cholesterol check was in 2023. - Order fasting blood work including CBC, chemistry panel, thyroid  function tests, and lipid panel - Encourage hydration before blood draw - Schedule follow-up appointment for lab results  Follow-up - Schedule fasting blood work for Thursday morning - Schedule six-month follow-up appointment.    Family/ staff Communication: Reviewed plan of care with patient verbalized understanding   Labs/tests ordered:  - Lipid panel; Future - TSH; Future - COMPLETE METABOLIC PANEL WITH GFR; Future - CBC with Differential/Platelet; Future  Next Appointment :   Spent 45 minutes of Face to face and non-face to face with patient  >50% time spent counseling; reviewing medical record; tests; labs; documentation and developing future plan of care.   Roxan JAYSON Plough, NP

## 2024-01-02 ENCOUNTER — Other Ambulatory Visit: Payer: Medicare Other

## 2024-01-02 DIAGNOSIS — I10 Essential (primary) hypertension: Secondary | ICD-10-CM

## 2024-01-02 DIAGNOSIS — K219 Gastro-esophageal reflux disease without esophagitis: Secondary | ICD-10-CM

## 2024-01-03 LAB — COMPLETE METABOLIC PANEL WITH GFR
AG Ratio: 1.1 (calc) (ref 1.0–2.5)
ALT: 9 U/L (ref 6–29)
AST: 18 U/L (ref 10–35)
Albumin: 3.7 g/dL (ref 3.6–5.1)
Alkaline phosphatase (APISO): 61 U/L (ref 37–153)
BUN: 20 mg/dL (ref 7–25)
CO2: 26 mmol/L (ref 20–32)
Calcium: 9.5 mg/dL (ref 8.6–10.4)
Chloride: 103 mmol/L (ref 98–110)
Creat: 0.88 mg/dL (ref 0.60–0.95)
Globulin: 3.5 g/dL (ref 1.9–3.7)
Glucose, Bld: 90 mg/dL (ref 65–99)
Potassium: 4.1 mmol/L (ref 3.5–5.3)
Sodium: 138 mmol/L (ref 135–146)
Total Bilirubin: 0.2 mg/dL (ref 0.2–1.2)
Total Protein: 7.2 g/dL (ref 6.1–8.1)
eGFR: 62 mL/min/{1.73_m2} (ref >=60)

## 2024-01-03 LAB — CBC WITH DIFFERENTIAL/PLATELET
Absolute Lymphocytes: 3245 {cells}/uL (ref 850–3900)
Absolute Monocytes: 736 {cells}/uL (ref 200–950)
Basophils Absolute: 38 {cells}/uL (ref 0–200)
Basophils Relative: 0.6 %
Eosinophils Absolute: 198 {cells}/uL (ref 15–500)
Eosinophils Relative: 3.1 %
HCT: 37.1 % (ref 35.0–45.0)
Hemoglobin: 11.1 g/dL — ABNORMAL LOW (ref 11.7–15.5)
MCH: 24.7 pg — ABNORMAL LOW (ref 27.0–33.0)
MCHC: 29.9 g/dL — ABNORMAL LOW (ref 32.0–36.0)
MCV: 82.6 fL (ref 80.0–100.0)
MPV: 11.9 fL (ref 7.5–12.5)
Monocytes Relative: 11.5 %
Neutro Abs: 2182 {cells}/uL (ref 1500–7800)
Neutrophils Relative %: 34.1 %
Platelets: 199 10*3/uL (ref 140–400)
RBC: 4.49 10*6/uL (ref 3.80–5.10)
RDW: 14.4 % (ref 11.0–15.0)
Total Lymphocyte: 50.7 %
WBC: 6.4 10*3/uL (ref 3.8–10.8)

## 2024-01-03 LAB — LIPID PANEL
Cholesterol: 152 mg/dL (ref <200)
HDL: 67 mg/dL (ref >=50)
LDL Cholesterol (Calc): 72 mg/dL
Non-HDL Cholesterol (Calc): 85 mg/dL (ref <130)
Total CHOL/HDL Ratio: 2.3 (calc) (ref <5.0)
Triglycerides: 58 mg/dL (ref <150)

## 2024-01-03 LAB — TSH: TSH: 2.85 m[IU]/L (ref 0.40–4.50)

## 2024-01-08 ENCOUNTER — Ambulatory Visit: Payer: Medicare Other | Admitting: Family

## 2024-01-15 ENCOUNTER — Ambulatory Visit: Payer: Medicare Other | Admitting: Family

## 2024-01-16 ENCOUNTER — Encounter: Payer: Self-pay | Admitting: Family

## 2024-01-16 ENCOUNTER — Telehealth: Payer: Self-pay

## 2024-01-16 NOTE — Telephone Encounter (Signed)
Patient states she would like to get the tramadol that was discussed sent to [facility]. I do not see that listed on the medication list at the moment

## 2024-01-16 NOTE — Telephone Encounter (Signed)
Patient has allergies listed as allergic to Tramadol and Codeine unable to order tramadol. Recommend tylenol 1000 mg twice daily for pain

## 2024-01-16 NOTE — Telephone Encounter (Signed)
Patient states she is no longer taking it because she said she didn't think she needed it at all. Patient states she is having some pain

## 2024-01-16 NOTE — Telephone Encounter (Signed)
Tramadol use not discuss during the visit but previous chart review looks like patient has taken tramadol 50 mg tablet one by mouth twice daily as needed.please verify why medication was stopped.will need to sign a non-opiod use contract for tramadol use.

## 2024-01-21 ENCOUNTER — Ambulatory Visit: Payer: Medicare Other | Admitting: Physician Assistant

## 2024-01-24 NOTE — Telephone Encounter (Signed)
 I attempted to call the patient twice.

## 2024-02-03 DIAGNOSIS — M25562 Pain in left knee: Secondary | ICD-10-CM | POA: Diagnosis not present

## 2024-02-04 ENCOUNTER — Telehealth: Payer: Self-pay | Admitting: Neurology

## 2024-02-04 NOTE — Telephone Encounter (Signed)
 Pt called in and left a message. She stated Dr. Loleta Chance was supposed to order her a wheelchair, but she has not heard anything about it.

## 2024-02-04 NOTE — Telephone Encounter (Signed)
 Called and talked to [facility] and they had not received request . Refax and called and let pt know she understood.

## 2024-02-06 NOTE — Telephone Encounter (Signed)
 Patient called and stated she needs transport chair and not a regular wheelchair

## 2024-02-06 NOTE — Telephone Encounter (Signed)
 Called patient and let her know that is she would like just the transport chair there is two places I gave to her " [facility] in [place] . [phone]. And [facility] supply on [address]. She is going to call [facility] and she will call up back and let us know what she will be doing. Told her that the insurance will only pay for one device in 3 - 5 years. She understood.

## 2024-02-10 ENCOUNTER — Telehealth: Payer: Self-pay | Admitting: Neurology

## 2024-02-10 NOTE — Telephone Encounter (Signed)
 Can not contact Pt. And can not complete order for wheelchair [facility] phone#  is [phone]

## 2024-02-12 NOTE — Telephone Encounter (Signed)
 Called and left message to call office or I would try again later.

## 2024-02-13 NOTE — Telephone Encounter (Signed)
 Pt reported that she is looking other places for a transfer chair not a wheel chair.

## 2024-02-19 ENCOUNTER — Encounter: Payer: Self-pay | Admitting: Family

## 2024-02-19 ENCOUNTER — Ambulatory Visit (INDEPENDENT_AMBULATORY_CARE_PROVIDER_SITE_OTHER): Admitting: Family

## 2024-02-19 VITALS — BP 136/84 | HR 61 | Temp 97.7°F | Resp 19 | Ht 65.5 in | Wt 185.4 lb

## 2024-02-19 DIAGNOSIS — L509 Urticaria, unspecified: Secondary | ICD-10-CM | POA: Diagnosis not present

## 2024-02-19 DIAGNOSIS — H6123 Impacted cerumen, bilateral: Secondary | ICD-10-CM

## 2024-02-19 DIAGNOSIS — K5909 Other constipation: Secondary | ICD-10-CM

## 2024-02-19 MED ORDER — LINACLOTIDE 145 MCG PO CAPS: 145.0000 ug | ORAL_CAPSULE | ORAL | 3 refills | Status: DC | PRN

## 2024-02-19 MED ORDER — LIDOCAINE 5 % EX OINT: TOPICAL_OINTMENT | Freq: Every day | CUTANEOUS | 3 refills | Status: AC | PRN

## 2024-02-19 MED ORDER — TRIAMCINOLONE ACETONIDE 0.1 % EX CREA: TOPICAL_CREAM | Freq: Two times a day (BID) | CUTANEOUS | 3 refills | Status: DC

## 2024-02-19 NOTE — Progress Notes (Signed)
 Provider: Richarda Blade FNP-C  Tilda Samudio, Donalee Citrin, NP  Patient Care Team: Lallie Strahm, Donalee Citrin, NP as PCP - General (Family Medicine) Christell Constant, MD as PCP - Cardiology (Cardiology)  Extended Emergency Contact Information Primary Emergency Contact: Rudean Curt [place] Home Phone: [phone] Mobile Phone: [phone] Relation: Son Secondary Emergency Contact: Chales Abrahams Address: [address]          [place], [place] [address] [place] Home Phone: [phone] Work Phone: [phone] Relation: Daughter  Code Status:  Full Code  Goals of care: Advanced Directive information    02/19/2024   10:10 AM  Advanced Directives  Does Patient Have a Medical Advance Directive? Yes  Type of Estate agent of [place];Living will;Out of facility DNR (pink MOST or yellow form)  Does patient want to make changes to medical advance directive? No - Patient declined  Copy of Healthcare Power of Attorney in Chart? No - copy requested     Chief Complaint  Patient presents with   Acute Visit    Ears lavaged    Discussed the use of AI scribe software for clinical note transcription with the patient, who gave verbal consent to proceed.  History of Present Illness   Angela Wang is a 89 year old female who presents with ear wax buildup and sinus issues.  She has been experiencing ear wax buildup and has been using ear drops to manage it. Her ears were cleaned today, but not much wax was removed. She prefers not to dig in her ears further and plans to continue using ear drops at home to soften the wax. No ear pain or tinnitus is present.  She experiences sinus problems daily, which are exacerbated by pollen. No high fever is present, but she sometimes experiences chills and feeling hot. She uses Nasonex for her allergies.  She experiences constipation, which she attributes to the meals provided by [employer], as  they include canned foods that she is not supposed to eat. She finds the meals binding and difficult to digest, leading to bowel movement difficulties. Her current medications include Linzess 140 mg for bowel issues.  She reports back pain, which is not as severe as during her last visit.      Past Medical History:  Diagnosis Date   ALLERGIC RHINITIS 06/21/2007   Qualifier: Diagnosis of  By: Nena Jordan    Allergy    allergic rhinitis   Anemia    nos   ANEMIA-NOS 06/21/2007   Qualifier: Diagnosis of  By: Nena Jordan    ANXIETY 06/21/2007   Qualifier: Diagnosis of  By: Nena Jordan    Arthritis    osteoarthritis   Atypical chest pain 09/2008   negative myoview   BACK PAIN, LUMBAR 03/06/2010   Qualifier: Diagnosis of  By: Nena Jordan    CEREBRAL ANEURYSM 06/21/2007   Qualifier: History of  By: Nena Jordan    CEREBROVASCULAR ACCIDENT, HX OF 06/21/2007   Qualifier: Diagnosis of  By: Nena Jordan    CHEST PAIN, ATYPICAL 09/02/2008   Qualifier: Diagnosis of  By: Nena Jordan    Chronic sinusitis    CONSTIPATION, CHRONIC 09/20/2009   Qualifier: Diagnosis of  By: Juanda Chance MD, Hedwig Morton    Depression    DEPRESSION 06/21/2007   Qualifier: Diagnosis of  By: Nena Jordan    Diverticulosis  DIVERTICULOSIS-COLON 06/09/2008   Qualifier: Diagnosis of  By: Wilmon Pali NP, Gunnar Fusi     Fibromyalgia    FIBROMYALGIA 06/21/2007   Qualifier: Diagnosis of  By: Laveda Abbe, HX OF 08/25/2008   Qualifier: Diagnosis of  By: Candice Camp CMA (AAMA), Dottie     GERD 05/03/2008   Qualifier: Diagnosis of  By: Renaldo Fiddler CMA, Leigh     GERD (gastroesophageal reflux disease)    Headache 03/15/2011   History of solitary pulmonary nodule    right, stable by CT   Hyperlipidemia    HYPERLIPIDEMIA 06/21/2007   Qualifier: Diagnosis of  By: Nena Jordan    Hypertension    HYPERTENSION 06/21/2007   Qualifier: Diagnosis of  By: Nena Jordan    HYPOGLYCEMIA  10/24/2010   Qualifier: Diagnosis of  By: Nena Jordan    HYPOTHYROIDISM 07/22/2008   Qualifier: Diagnosis of  By: Nena Jordan    IBS 06/21/2007   Qualifier: Diagnosis of  By: Nena Jordan    IBS (irritable bowel syndrome)    INSOMNIA, CHRONIC 07/19/2009   Qualifier: Diagnosis of  By: Nena Jordan    LEG PAIN, RIGHT 02/01/2009   Qualifier: Diagnosis of  By: Nena Jordan    Leukopenia    chronic   LEUKOPENIA, CHRONIC 06/21/2007   Qualifier: Diagnosis of  By: Nena Jordan    NECK PAIN 02/03/2008   Qualifier: Diagnosis of  By: Nena Jordan    NUMBNESS 01/29/2008   Qualifier: Diagnosis of  By: Everardo All MD, Dorena Dew NOS-Unspec 06/21/2007   Centricity Description: DEGENERATIVE JOINT DISEASE, MILD Qualifier: Diagnosis of  By: Nena Jordan  Centricity Description: OSTEOARTHRITIS Qualifier: Diagnosis of  By: Nena Jordan    PULMONARY NODULE 12/26/2007   Qualifier: Diagnosis of  By: Nena Jordan    Routine general medical examination at a health care facility    SCIATICA, RIGHT 05/06/2008   Qualifier: Diagnosis of  By: Laural Roes, Sarah     Stroke [facility])    THROMBOCYTOPENIA 02/09/2009   Qualifier: Diagnosis of  By: Nena Jordan    UNSTEADY GAIT 07/28/2009   Qualifier: Diagnosis of  By: Nena Jordan    VENOUS INSUFFICIENCY 05/03/2008   Qualifier: Diagnosis of  By: Renaldo Fiddler CMA, Marliss Czar     Past Surgical History:  Procedure Laterality Date   ABDOMINAL HYSTERECTOMY     ANEURYSM COILING     APPENDECTOMY     CHOLECYSTECTOMY     JOINT REPLACEMENT     total right knee replacement   KNEE SURGERY      Allergies  Allergen Reactions   Amitriptyline Hcl Swelling and Other (See Comments)    Headache   Cyclobenzaprine Swelling   Escitalopram Oxalate     REACTION: Hallucinations  Other Reaction(s): Mental Status Changes, Psychosis   Tramadol Nausea And Vomiting and Other (See Comments)    Other Reaction(s): GI Intolerance   Furosemide  Nausea And Vomiting    Abdominal pain  Other Reaction(s): GI Intolerance, Other (See Comments)   Hydrocodone Other (See Comments)    Other Reaction(s): GI Intolerance   Hydrocodone-Acetaminophen     REACTION: stomach upset   Other Swelling    Other reaction(s): Other (See Comments) ANASTHESIA   Oxycodone-Acetaminophen     REACTION: Nausea and upset stomach   Terbinafine Other (See Comments)  Aspirin Other (See Comments)    REACTION: stomach upset   Baclofen Other (See Comments)    Other Reaction(s): GI Intolerance   Citalopram Other (See Comments)    Other Reaction(s): GI Intolerance   Codeine Nausea Only and Other (See Comments)    REACTION: stomach upset  Other Reaction(s): GI Intolerance   Diazepam     Other Reaction(s): GI Intolerance   Escitalopram Oxalate     Other Reaction(s): GI Intolerance   Gabapentin     GI Upset  Other Reaction(s): GI Intolerance, Other (See Comments)   Hydrocodone-Acetaminophen     Other Reaction(s): GI Intolerance  Headache   Lactulose Other (See Comments)    Passed out  Other Reaction(s): Other (See Comments)   Latex Rash    REACTION: rash REACTION: rash REACTION: rash   Levofloxacin Nausea And Vomiting and Nausea Only    Other Reaction(s): GI Intolerance, Other (See Comments)   Lorazepam Other (See Comments)    REACTION: Throat Swelling  Other Reaction(s): GI Intolerance, Other (See Comments)  Other reaction(s): Other (See Comments), REACTION: Throat Swelling   Moxifloxacin Nausea And Vomiting and Nausea Only    Other Reaction(s): GI Intolerance, Other (See Comments)   Oxycodone Other (See Comments)    Headache, change in mental status  Other Reaction(s): Mental Status Changes, Other (See Comments), Psychosis  Headache   Pantoprazole Nausea Only and Other (See Comments)    Other Reaction(s): GI Intolerance, Other (See Comments)   Polyethylene Glycol 3350 Other (See Comments)    Makes her bloated  Other Reaction(s): GI  Intolerance   Rabeprazole Nausea Only and Other (See Comments)    Other Reaction(s): GI Intolerance   Terfenadine     REACTION: stomach upset  Other Reaction(s): GI Intolerance    Outpatient Encounter Medications as of 02/19/2024  Medication Sig   ALPRAZolam (XANAX) 0.5 MG tablet Take 1 tablet (0.5 mg total) by mouth at bedtime as needed for sleep or anxiety.   B Complex Vitamins (B COMPLEX 100 PO) Take 1 tablet by mouth daily.   bisacodyl (DULCOLAX) 5 MG EC tablet as needed for moderate constipation or severe constipation.   Calcium-Magnesium-Vitamin D 500-250-200 MG-MG-UNIT TABS Take 1 tablet by mouth daily.   co-enzyme Q-10 30 MG capsule Take 30 mg by mouth 1 day or 1 dose.   cyclobenzaprine (FLEXERIL) 10 MG tablet Take 1 tablet (10 mg total) by mouth 2 (two) times daily as needed for muscle spasms.   diclofenac Sodium (VOLTAREN) 1 % GEL Apply 4 g topically 4 (four) times daily.   DULoxetine (CYMBALTA) 30 MG capsule Take 1 capsule (30 mg total) by mouth daily.   ezetimibe (ZETIA) 10 MG tablet TAKE ONE TABLET BY MOUTH DAILY. MUST MAKE APPOINTMENT FOR FURTHER REFILLS   fluocinonide cream (LIDEX) 0.05 % every other day.   fluticasone (FLONASE) 50 MCG/ACT nasal spray Place 2 sprays into both nostrils as needed for allergies or rhinitis.   furosemide (LASIX) 40 MG tablet Take 1 tablet (40 mg total) by mouth daily. (Patient taking differently: Take 40 mg by mouth as needed.)   ipratropium (ATROVENT) 0.03 % nasal spray as needed for congestion.   iron polysaccharides (NIFEREX) 150 MG capsule Take 150 mg by mouth daily.   Multiple Vitamin (MULTI-VITAMIN) tablet Take 1 tablet by mouth daily.   Olopatadine HCl 0.2 % SOLN Apply 1 drop to eye daily.   omeprazole (PRILOSEC) 40 MG capsule Take 1 capsule (40 mg total) by mouth daily.   pravastatin (PRAVACHOL)  20 MG tablet Take 1 tablet (20 mg total) by mouth every evening.   promethazine-dextromethorphan (PROMETHAZINE-DM) 6.25-15 MG/5ML syrup Take 5  mLs by mouth as directed.   vitamin C (ASCORBIC ACID) 500 MG tablet Take 500 mg by mouth.   [DISCONTINUED] lidocaine (XYLOCAINE) 5 % ointment Apply topically.   [DISCONTINUED] linaclotide (LINZESS) 145 MCG CAPS capsule Take 145 mcg by mouth as needed (irritable bowel).   [DISCONTINUED] triamcinolone cream (KENALOG) 0.1 % MIX WITH CETAPHIL MOISTURIZING CREAM. APPLY THREE TIMES DAILY AS NEEDED   folic acid (FOLVITE) 1 MG tablet Take 1 mg by mouth daily. (Patient not taking: Reported on 02/19/2024)   lidocaine (XYLOCAINE) 5 % ointment Apply topically daily as needed.   linaclotide (LINZESS) 145 MCG CAPS capsule Take 1 capsule (145 mcg total) by mouth as needed (irritable bowel).   triamcinolone cream (KENALOG) 0.1 % Apply topically 2 (two) times daily.   No facility-administered encounter medications on file as of 02/19/2024.    Review of Systems  Constitutional:  Negative for appetite change, chills, fatigue, fever and unexpected weight change.  HENT:  Negative for congestion, dental problem, ear discharge, ear pain, facial swelling, hearing loss, nosebleeds, postnasal drip, rhinorrhea, sinus pressure, sinus pain, sneezing, sore throat, tinnitus and trouble swallowing.   Eyes:  Negative for pain, discharge, redness, itching and visual disturbance.  Respiratory:  Negative for cough, chest tightness, shortness of breath and wheezing.   Cardiovascular:  Negative for chest pain, palpitations and leg swelling.  Gastrointestinal:  Positive for constipation. Negative for abdominal distention, abdominal pain, blood in stool, diarrhea, nausea and vomiting.  Endocrine: Negative for cold intolerance, heat intolerance, polydipsia, polyphagia and polyuria.  Genitourinary:  Negative for difficulty urinating, dysuria, flank pain, frequency and urgency.  Musculoskeletal:  Negative for arthralgias, back pain, gait problem, joint swelling, myalgias, neck pain and neck stiffness.  Skin:  Negative for color change,  pallor, rash and wound.  Neurological:  Negative for dizziness, syncope, speech difficulty, weakness, light-headedness, numbness and headaches.  Hematological:  Does not bruise/bleed easily.  Psychiatric/Behavioral:  Negative for agitation, behavioral problems, confusion, hallucinations, self-injury, sleep disturbance and suicidal ideas. The patient is not nervous/anxious.     Immunization History  Administered Date(s) Administered   Influenza Whole 09/12/2008, 09/12/2009, 08/15/2010   Influenza, High Dose Seasonal PF 08/26/2019   Influenza-Unspecified 08/26/2023   Moderna Sars-Covid-2 Vaccination 10/18/2020, 05/08/2021   PFIZER(Purple Top)SARS-COV-2 Vaccination 01/01/2020, 01/22/2020   PNEUMOCOCCAL CONJUGATE-20 08/26/2023   Pfizer Covid-19 Vaccine Bivalent Booster 61yrs & up 12/06/2021, 10/31/2022   Pneumococcal Conjugate-13 12/03/2013   Pneumococcal Polysaccharide-23 11/13/1999, 10/16/2004, 01/11/2015   Pneumococcal-Unspecified 12/03/2013   Smallpox 09/12/1964   Td 02/24/2004   Tdap 08/21/2017   Zoster Recombinant(Shingrix) 08/26/2019, 11/07/2019   Zoster, Live 11/26/2012   Pertinent  Health Maintenance Due  Topic Date Due   DEXA SCAN  Never done   INFLUENZA VACCINE  Completed      05/22/2021   10:32 AM 12/20/2022   10:48 AM 12/26/2023   12:55 PM 12/31/2023    2:09 PM 02/19/2024   10:08 AM  Fall Risk  Falls in the past year?  0 0 0 0  Was there an injury with Fall?  0 0 0 0  Fall Risk Category Calculator  0 0 0 0  (RETIRED) Patient Fall Risk Level Low fall risk      Patient at Risk for Falls Due to    Impaired balance/gait;Impaired mobility History of fall(s)  Fall risk Follow up    Falls evaluation  completed Falls evaluation completed   Functional Status Survey:    Vitals:   02/19/24 1017  BP: 136/84  Pulse: 61  Resp: 19  Temp: 97.7 F (36.5 C)  SpO2: 93%  Weight: 185 lb 6.4 oz (84.1 kg)  Height: 5' 5.5" (1.664 m)   Body mass index is 30.38 kg/m. Physical  Exam  GENERAL: Alert, cooperative, well developed, no acute distress. HEENT: Normocephalic, normal oropharynx, moist mucous membranes. Cerumen present in right ear, improved. Nose and throat normal. CHEST: Clear to auscultation bilaterally. No wheezes, rhonchi, or crackles. CARDIOVASCULAR: Normal heart rate and rhythm, S1 and S2 normal without murmurs. ABDOMEN: Soft, non-tender, non-distended, without organomegaly. Normal bowel sounds. EXTREMITIES: No cyanosis or edema. MUSCULOSKELETAL: Back pain present, improved from last time. NEUROLOGICAL: Cranial nerves grossly intact, moves all extremities without gross motor or sensory deficit.   Labs reviewed: Recent Labs    08/14/23 1130 01/02/24 0932  NA 139 138  K 4.2 4.1  CL 103 103  CO2 24 26  GLUCOSE 83 90  BUN 18 20  CREATININE 0.85 0.88  CALCIUM 9.0 9.5   Recent Labs    01/02/24 0932  AST 18  ALT 9  BILITOT 0.2  PROT 7.2   Recent Labs    08/14/23 1130 01/02/24 0932  WBC  --  6.4  NEUTROABS  --  2,182  HGB  --  11.1*  HCT 35.8 37.1  MCV  --  82.6  PLT  --  199   Lab Results  Component Value Date   TSH 2.85 01/02/2024   Lab Results  Component Value Date   HGBA1C 6.0 (H) 12/10/2016   Lab Results  Component Value Date   CHOL 152 01/02/2024   HDL 67 01/02/2024   LDLCALC 72 01/02/2024   TRIG 58 01/02/2024   CHOLHDL 2.3 01/02/2024    Significant Diagnostic Results in last 30 days:  No results found.  Assessment/Plan  Cerumen impaction Cerumen impaction in the right ear with improvement after partial wax removal. No pain or tinnitus reported. - Continue ear drops in the right ear to soften wax.  Chronic sinusitis Chronic sinusitis with no acute exacerbation. She uses Nasonex for allergy management, especially during pollen season.  Pruritus Pruritus managed with triamcinolone cream applied to legs and lidocaine as needed for itching relief. - Prescribe triamcinolone cream. - Prescribe lidocaine  cream.  Constipation Constipation likely related to dietary changes with [employer], including canned foods and lack of vegetables. Reports difficulty with bowel movements. -Refill Linzess for constipation management.   Family/ staff Communication: Reviewed plan of care with patient verbalized understanding   Labs/tests ordered: None   Next Appointment: Return if symptoms worsen or fail to improve.   Total time: 20 minutes. Greater than 50% of total time spent doing patient education regarding Cerumen impaction,chronic sinusitis,Pruritus,Constipation,health maintenance including symptom/medication management.   Caesar Bookman, NP

## 2024-03-04 ENCOUNTER — Telehealth: Payer: Self-pay

## 2024-03-04 DIAGNOSIS — K5909 Other constipation: Secondary | ICD-10-CM

## 2024-03-04 MED ORDER — LINACLOTIDE 145 MCG PO CAPS: 145.0000 ug | ORAL_CAPSULE | ORAL | 3 refills | Status: DC | PRN

## 2024-03-04 NOTE — Telephone Encounter (Signed)
 Copied from CRM [id number]. Topic: Clinical - Prescription Issue >> Mar 04, 2024 11:59 AM Hector Shade B wrote: Reason for CRM: linaclotide (LINZESS) 145 MCG CAPS capsule: patient states the medication was sent to [facility] and is over 200.00 she is needing this prescription to be sent over to see if she can get it much cheaper.  [id number] [place], [place] - [address]  Phone: [phone] Fax: [phone]

## 2024-03-15 DIAGNOSIS — R059 Cough, unspecified: Secondary | ICD-10-CM | POA: Diagnosis not present

## 2024-03-15 DIAGNOSIS — U071 COVID-19: Secondary | ICD-10-CM | POA: Diagnosis not present

## 2024-03-19 ENCOUNTER — Encounter: Payer: Self-pay | Admitting: Radiology

## 2024-03-23 ENCOUNTER — Ambulatory Visit: Payer: Medicare Other | Admitting: Nurse Practitioner

## 2024-03-23 NOTE — Progress Notes (Deleted)
 Cardiology Office Note    Patient Name: Angela Wang Date of Encounter: 03/23/2024  Primary Care Provider:  Estil Heman, NP Primary Cardiologist:  Jann Melody, MD Primary Electrophysiologist: None   Past Medical History    Past Medical History:  Diagnosis Date   ALLERGIC RHINITIS 06/21/2007   Qualifier: Diagnosis of  By: Marthe Slain    Allergy    allergic rhinitis   Anemia    nos   ANEMIA-NOS 06/21/2007   Qualifier: Diagnosis of  By: Marthe Slain    ANXIETY 06/21/2007   Qualifier: Diagnosis of  By: Marthe Slain    Arthritis    osteoarthritis   Atypical chest pain 09/2008   negative myoview   BACK PAIN, LUMBAR 03/06/2010   Qualifier: Diagnosis of  By: Marthe Slain    CEREBRAL ANEURYSM 06/21/2007   Qualifier: History of  By: Marthe Slain    CEREBROVASCULAR ACCIDENT, HX OF 06/21/2007   Qualifier: Diagnosis of  By: Marthe Slain    CHEST PAIN, ATYPICAL 09/02/2008   Qualifier: Diagnosis of  By: Marthe Slain    Chronic sinusitis    CONSTIPATION, CHRONIC 09/20/2009   Qualifier: Diagnosis of  By: Grandville Lax MD, Raj Burdock    Depression    DEPRESSION 06/21/2007   Qualifier: Diagnosis of  By: Marthe Slain    Diverticulosis    DIVERTICULOSIS-COLON 06/09/2008   Qualifier: Diagnosis of  By: Daphane Dynes NP, Maureen Sour     Fibromyalgia    FIBROMYALGIA 06/21/2007   Qualifier: Diagnosis of  By: Loetta Ringer, HX OF 08/25/2008   Qualifier: Diagnosis of  By: Nelson-Smith CMA (AAMA), Dottie     GERD 05/03/2008   Qualifier: Diagnosis of  By: Avis Boehringer CMA, Leigh     GERD (gastroesophageal reflux disease)    Headache 03/15/2011   History of solitary pulmonary nodule    right, stable by CT   Hyperlipidemia    HYPERLIPIDEMIA 06/21/2007   Qualifier: Diagnosis of  By: Marthe Slain    Hypertension    HYPERTENSION 06/21/2007   Qualifier: Diagnosis of  By: Marthe Slain    HYPOGLYCEMIA 10/24/2010   Qualifier: Diagnosis of  By: Marthe Slain    HYPOTHYROIDISM 07/22/2008   Qualifier: Diagnosis of  By: Marthe Slain    IBS 06/21/2007   Qualifier: Diagnosis of  By: Marthe Slain    IBS (irritable bowel syndrome)    INSOMNIA, CHRONIC 07/19/2009   Qualifier: Diagnosis of  By: Marthe Slain    LEG PAIN, RIGHT 02/01/2009   Qualifier: Diagnosis of  By: Marthe Slain    Leukopenia    chronic   LEUKOPENIA, CHRONIC 06/21/2007   Qualifier: Diagnosis of  By: Marthe Slain    NECK PAIN 02/03/2008   Qualifier: Diagnosis of  By: Marthe Slain    NUMBNESS 01/29/2008   Qualifier: Diagnosis of  By: Washington Hacker MD, Junie Olds NOS-Unspec 06/21/2007   Centricity Description: DEGENERATIVE JOINT DISEASE, MILD Qualifier: Diagnosis of  By: Marthe Slain  Centricity Description: OSTEOARTHRITIS Qualifier: Diagnosis of  By: Marthe Slain    PULMONARY NODULE 12/26/2007   Qualifier: Diagnosis of  By: Marthe Slain    Routine general medical examination at a health care facility    SCIATICA, RIGHT  05/06/2008   Qualifier: Diagnosis of  By: Maile Score, Sarah     Stroke [facility])    THROMBOCYTOPENIA 02/09/2009   Qualifier: Diagnosis of  By: Marthe Slain    UNSTEADY GAIT 07/28/2009   Qualifier: Diagnosis of  By: Marthe Slain    VENOUS INSUFFICIENCY 05/03/2008   Qualifier: Diagnosis of  By: Avis Boehringer CMA, Leigh      History of Present Illness  Angela Wang is a 89 y.o. female with a PMH of***    Patient denies chest pain, palpitations, dyspnea, PND, orthopnea, nausea, vomiting, dizziness, syncope, edema, weight gain, or early satiety.   Discussed the use of AI scribe software for clinical note transcription with the patient, who gave verbal consent to proceed.  History of Present Illness    ***Notes: -Last ischemic evaluation:  Review of Systems  Please see the history of present illness.    All other systems reviewed and are otherwise negative except as noted above.  Physical Exam    Wt  Readings from Last 3 Encounters:  02/19/24 185 lb 6.4 oz (84.1 kg)  12/31/23 181 lb 1.6 oz (82.1 kg)  12/26/23 177 lb (80.3 kg)   ZO:[id number] were no vitals filed for this visit.,There is no height or weight on file to calculate BMI. GEN: Well nourished, well developed in no acute distress Neck: No JVD; No carotid bruits Pulmonary: Clear to auscultation without rales, wheezing or rhonchi  Cardiovascular: Normal rate. Regular rhythm. Normal S1. Normal S2.   Murmurs: There is no murmur.  ABDOMEN: Soft, non-tender, non-distended EXTREMITIES:  No edema; No deformity   EKG/LABS/ Recent Cardiac Studies   ECG personally reviewed by me today - ***  Risk Assessment/Calculations:   {Does this patient have ATRIAL FIBRILLATION?:[id number]}      Lab Results  Component Value Date   WBC 6.4 01/02/2024   HGB 11.1 (L) 01/02/2024   HCT 37.1 01/02/2024   MCV 82.6 01/02/2024   PLT 199 01/02/2024   Lab Results  Component Value Date   CREATININE 0.88 01/02/2024   BUN 20 01/02/2024   NA 138 01/02/2024   K 4.1 01/02/2024   CL 103 01/02/2024   CO2 26 01/02/2024   Lab Results  Component Value Date   CHOL 152 01/02/2024   HDL 67 01/02/2024   LDLCALC 72 01/02/2024   TRIG 58 01/02/2024   CHOLHDL 2.3 01/02/2024    Lab Results  Component Value Date   HGBA1C 6.0 (H) 12/10/2016   Assessment & Plan    1.***  2.***  3.***  4.***      Disposition: Follow-up with Jann Melody, MD or APP in *** months {Are you ordering a CV Procedure (e.g. stress test, cath, DCCV, TEE, etc)?   Press F2        :[id number]}   Signed, Francene Ing, Retha Cast, NP 03/23/2024, 4:48 PM [facility]

## 2024-03-25 ENCOUNTER — Ambulatory Visit: Admitting: Nurse Practitioner

## 2024-03-27 ENCOUNTER — Telehealth

## 2024-03-27 DIAGNOSIS — K5909 Other constipation: Secondary | ICD-10-CM

## 2024-03-27 MED ORDER — LINACLOTIDE 145 MCG PO CAPS
145.0000 ug | ORAL_CAPSULE | ORAL | 3 refills | Status: AC | PRN
Start: 2024-03-27 — End: ?

## 2024-03-27 NOTE — Telephone Encounter (Signed)
 Copied from CRM [id number]. Topic: Clinical - Prescription Issue >> Mar 04, 2024 11:59 AM Arlie Benedict B wrote: Reason for CRM: linaclotide  (LINZESS ) 145 MCG CAPS capsule: patient states the medication was sent to [facility] and is over 200.00 she is needing this prescription to be sent over to see if she can get it much cheaper.  [id number] [place], [place] - [address]  Phone: [phone] Fax: [phone] >> Mar 27, 2024  8:36 AM Retta Caster wrote: linaclotide  (LINZESS ) 145 MCG CAPS capsule     Patient calling due to pharmacy insutructed her to do a 90 day supply if possible . Needs call back on update [phone]

## 2024-03-27 NOTE — Telephone Encounter (Signed)
 Prescription sent to another pharmacy as patient requested.

## 2024-04-01 DIAGNOSIS — M79672 Pain in left foot: Secondary | ICD-10-CM | POA: Diagnosis not present

## 2024-04-01 DIAGNOSIS — B351 Tinea unguium: Secondary | ICD-10-CM | POA: Diagnosis not present

## 2024-04-01 DIAGNOSIS — M79671 Pain in right foot: Secondary | ICD-10-CM | POA: Diagnosis not present

## 2024-04-15 ENCOUNTER — Ambulatory Visit: Payer: Self-pay

## 2024-04-15 NOTE — Telephone Encounter (Signed)
  1st attempt, no answer, left voicemail for patient to return call for nurse triage.  Copied from CRM [id number]. Topic: Clinical - Medication Question >> Apr 15, 2024  3:23 PM Adrianna P wrote: Reason for CRM: Patient would like a prescription for Amoxicllin/ Clavulanate potassium tablets, because it has helped her before. Also, a prescription for Promethazine, she has been coughing up gunk

## 2024-04-15 NOTE — Telephone Encounter (Signed)
 This RN attempted third outbound attempt to patient. LVM. Will forward to office for further follow up.

## 2024-04-15 NOTE — Telephone Encounter (Signed)
 This RN attempted 2nd outbound attempt. No answer. LVM.

## 2024-04-16 NOTE — Telephone Encounter (Signed)
 Patient called and appointment scheduled with Monina for 5/23 for evaluation.

## 2024-04-17 ENCOUNTER — Encounter: Payer: Self-pay | Admitting: Adult Health

## 2024-04-17 ENCOUNTER — Ambulatory Visit: Admitting: Adult Health

## 2024-04-17 VITALS — BP 136/90 | HR 75 | Temp 96.0°F | Ht 65.5 in | Wt 185.6 lb

## 2024-04-17 DIAGNOSIS — B9689 Other specified bacterial agents as the cause of diseases classified elsewhere: Secondary | ICD-10-CM | POA: Diagnosis not present

## 2024-04-17 DIAGNOSIS — R059 Cough, unspecified: Secondary | ICD-10-CM

## 2024-04-17 DIAGNOSIS — E785 Hyperlipidemia, unspecified: Secondary | ICD-10-CM

## 2024-04-17 DIAGNOSIS — J029 Acute pharyngitis, unspecified: Secondary | ICD-10-CM | POA: Diagnosis not present

## 2024-04-17 DIAGNOSIS — H6123 Impacted cerumen, bilateral: Secondary | ICD-10-CM

## 2024-04-17 DIAGNOSIS — J208 Acute bronchitis due to other specified organisms: Secondary | ICD-10-CM

## 2024-04-17 LAB — POCT RAPID STREP A (OFFICE): Rapid Strep A Screen: NEGATIVE

## 2024-04-17 LAB — POCT INFLUENZA A/B
Influenza A, POC: NEGATIVE
Influenza B, POC: NEGATIVE

## 2024-04-17 LAB — POC COVID19 BINAXNOW: SARS Coronavirus 2 Ag: NEGATIVE

## 2024-04-17 MED ORDER — DEBROX 6.5 % OT SOLN: 5.0000 [drp] | Freq: Two times a day (BID) | OTIC | Status: AC

## 2024-04-17 MED ORDER — PROMETHAZINE-DM 6.25-15 MG/5ML PO SYRP: 5.0000 mL | ORAL_SOLUTION | Freq: Four times a day (QID) | ORAL | 0 refills | Status: AC | PRN

## 2024-04-17 MED ORDER — AMOXICILLIN-POT CLAVULANATE 875-125 MG PO TABS: 1.0000 | ORAL_TABLET | Freq: Two times a day (BID) | ORAL | 0 refills | Status: AC

## 2024-04-17 NOTE — Progress Notes (Signed)
 [facility] clinic  Provider:  Inge Mangle DNP  Code Status:  Full Code  Goals of Care:     02/19/2024   10:10 AM  Advanced Directives  Does Patient Have a Medical Advance Directive? Yes  Type of Estate agent of [place];Living will;Out of facility DNR (pink MOST or yellow form)  Does patient want to make changes to medical advance directive? No - Patient declined  Copy of Healthcare Power of Attorney in Chart? No - copy requested     Chief Complaint  Patient presents with   Cough    Patient complains of cough., running nose, sore throat, body weak    Discussed the use of AI scribe software for clinical note transcription with the patient, who gave verbal consent to proceed.  HPI: Patient is a 89 y.o. female seen today for an acute visit for cough.  Three weeks ago, she was diagnosed with COVID-19 in the ER. Initially, her symptoms improved, but they have since returned. She experiences a persistent cough, runny nose, sore throat, and ear pain, particularly in the right ear. She produces green phlegm and feels weak. Her throat becomes dry at night, necessitating frequent hydration. No sneezing is reported.  She denies fever but feels cold and experiences chills. She has shortness of breath, especially when coughing, and sometimes finds breathing uncomfortable.  Her past medical history includes a long-standing history of bronchitis and multiple allergies. She does not smoke, drink alcohol, or use drugs.  She uses a cough syrup that she believes helps with her symptoms.  She received a COVID-19 vaccine last year but not this year. However, she did receive a pneumonia and flu shot. She wants to get the COVID-19 vaccine soon.     Past Medical History:  Diagnosis Date   ALLERGIC RHINITIS 06/21/2007   Qualifier: Diagnosis of  By: Marthe Slain    Allergy    allergic rhinitis   Anemia    nos   ANEMIA-NOS 06/21/2007   Qualifier: Diagnosis of  By:  Marthe Slain    ANXIETY 06/21/2007   Qualifier: Diagnosis of  By: Marthe Slain    Arthritis    osteoarthritis   Atypical chest pain 09/2008   negative myoview   BACK PAIN, LUMBAR 03/06/2010   Qualifier: Diagnosis of  By: Marthe Slain    CEREBRAL ANEURYSM 06/21/2007   Qualifier: History of  By: Marthe Slain    CEREBROVASCULAR ACCIDENT, HX OF 06/21/2007   Qualifier: Diagnosis of  By: Marthe Slain    CHEST PAIN, ATYPICAL 09/02/2008   Qualifier: Diagnosis of  By: Marthe Slain    Chronic sinusitis    CONSTIPATION, CHRONIC 09/20/2009   Qualifier: Diagnosis of  By: Grandville Lax MD, Raj Burdock    Depression    DEPRESSION 06/21/2007   Qualifier: Diagnosis of  By: Marthe Slain    Diverticulosis    DIVERTICULOSIS-COLON 06/09/2008   Qualifier: Diagnosis of  By: Daphane Dynes NP, Maureen Sour     Fibromyalgia    FIBROMYALGIA 06/21/2007   Qualifier: Diagnosis of  By: Loetta Ringer, HX OF 08/25/2008   Qualifier: Diagnosis of  By: Nelson-Smith CMA (AAMA), Dottie     GERD 05/03/2008   Qualifier: Diagnosis of  By: Avis Boehringer CMA, Leigh     GERD (gastroesophageal reflux disease)    Headache 03/15/2011   History of solitary pulmonary nodule    right,  stable by CT   Hyperlipidemia    HYPERLIPIDEMIA 06/21/2007   Qualifier: Diagnosis of  By: Marthe Slain    Hypertension    HYPERTENSION 06/21/2007   Qualifier: Diagnosis of  By: Marthe Slain    HYPOGLYCEMIA 10/24/2010   Qualifier: Diagnosis of  By: Marthe Slain    HYPOTHYROIDISM 07/22/2008   Qualifier: Diagnosis of  By: Marthe Slain    IBS 06/21/2007   Qualifier: Diagnosis of  By: Marthe Slain    IBS (irritable bowel syndrome)    INSOMNIA, CHRONIC 07/19/2009   Qualifier: Diagnosis of  By: Marthe Slain    LEG PAIN, RIGHT 02/01/2009   Qualifier: Diagnosis of  By: Marthe Slain    Leukopenia    chronic   LEUKOPENIA, CHRONIC 06/21/2007   Qualifier: Diagnosis of  By: Marthe Slain    NECK PAIN  02/03/2008   Qualifier: Diagnosis of  By: Marthe Slain    NUMBNESS 01/29/2008   Qualifier: Diagnosis of  By: Washington Hacker MD, Junie Olds NOS-Unspec 06/21/2007   Centricity Description: DEGENERATIVE JOINT DISEASE, MILD Qualifier: Diagnosis of  By: Marthe Slain  Centricity Description: OSTEOARTHRITIS Qualifier: Diagnosis of  By: Marthe Slain    PULMONARY NODULE 12/26/2007   Qualifier: Diagnosis of  By: Marthe Slain    Routine general medical examination at a health care facility    SCIATICA, RIGHT 05/06/2008   Qualifier: Diagnosis of  By: Maile Score, Sarah     Stroke [facility])    THROMBOCYTOPENIA 02/09/2009   Qualifier: Diagnosis of  By: Marthe Slain    UNSTEADY GAIT 07/28/2009   Qualifier: Diagnosis of  By: Marthe Slain    VENOUS INSUFFICIENCY 05/03/2008   Qualifier: Diagnosis of  By: Avis Boehringer CMA, Cleave Curling      Past Surgical History:  Procedure Laterality Date   ABDOMINAL HYSTERECTOMY     ANEURYSM COILING     APPENDECTOMY     CHOLECYSTECTOMY     JOINT REPLACEMENT     total right knee replacement   KNEE SURGERY      Allergies  Allergen Reactions   Amitriptyline Hcl Swelling and Other (See Comments)    Headache   Cyclobenzaprine  Swelling   Escitalopram Oxalate     REACTION: Hallucinations  Other Reaction(s): Mental Status Changes, Psychosis   Tramadol  Nausea And Vomiting and Other (See Comments)    Other Reaction(s): GI Intolerance   Furosemide  Nausea And Vomiting    Abdominal pain  Other Reaction(s): GI Intolerance, Other (See Comments)   Hydrocodone Other (See Comments)    Other Reaction(s): GI Intolerance   Hydrocodone-Acetaminophen      REACTION: stomach upset   Other Swelling    Other reaction(s): Other (See Comments) ANASTHESIA   Oxycodone -Acetaminophen      REACTION: Nausea and upset stomach   Terbinafine Other (See Comments)   Aspirin Other (See Comments)    REACTION: stomach upset   Baclofen Other (See Comments)    Other Reaction(s): GI  Intolerance   Citalopram Other (See Comments)    Other Reaction(s): GI Intolerance   Codeine  Nausea Only and Other (See Comments)    REACTION: stomach upset  Other Reaction(s): GI Intolerance   Diazepam      Other Reaction(s): GI Intolerance   Escitalopram Oxalate     Other Reaction(s): GI Intolerance   Gabapentin      GI Upset  Other Reaction(s): GI Intolerance, Other (See Comments)   Hydrocodone-Acetaminophen      Other Reaction(s): GI Intolerance  Headache   Lactulose Other (See Comments)    Passed out  Other Reaction(s): Other (See Comments)   Latex Rash    REACTION: rash REACTION: rash REACTION: rash   Levofloxacin Nausea And Vomiting and Nausea Only    Other Reaction(s): GI Intolerance, Other (See Comments)   Lorazepam Other (See Comments)    REACTION: Throat Swelling  Other Reaction(s): GI Intolerance, Other (See Comments)  Other reaction(s): Other (See Comments), REACTION: Throat Swelling   Moxifloxacin Nausea And Vomiting and Nausea Only    Other Reaction(s): GI Intolerance, Other (See Comments)   Oxycodone  Other (See Comments)    Headache, change in mental status  Other Reaction(s): Mental Status Changes, Other (See Comments), Psychosis  Headache   Pantoprazole Nausea Only and Other (See Comments)    Other Reaction(s): GI Intolerance, Other (See Comments)   Polyethylene Glycol 3350 Other (See Comments)    Makes her bloated  Other Reaction(s): GI Intolerance   Rabeprazole Nausea Only and Other (See Comments)    Other Reaction(s): GI Intolerance   Terfenadine     REACTION: stomach upset  Other Reaction(s): GI Intolerance    Outpatient Encounter Medications as of 04/17/2024  Medication Sig   ALPRAZolam  (XANAX ) 0.5 MG tablet Take 1 tablet (0.5 mg total) by mouth at bedtime as needed for sleep or anxiety.   [EXPIRED] amoxicillin -clavulanate (AUGMENTIN ) 875-125 MG tablet Take 1 tablet by mouth 2 (two) times daily for 7 days.   B Complex Vitamins (B COMPLEX  100 PO) Take 1 tablet by mouth daily.   bisacodyl (DULCOLAX) 5 MG EC tablet as needed for moderate constipation or severe constipation.   Calcium -Magnesium-Vitamin D  500-250-200 MG-MG-UNIT TABS Take 1 tablet by mouth daily.   [EXPIRED] carbamide peroxide (DEBROX) 6.5 % OTIC solution Place 5 drops into both ears 2 (two) times daily for 4 days.   co-enzyme Q-10 30 MG capsule Take 30 mg by mouth 1 day or 1 dose.   cyclobenzaprine  (FLEXERIL ) 10 MG tablet Take 1 tablet (10 mg total) by mouth 2 (two) times daily as needed for muscle spasms.   diclofenac  Sodium (VOLTAREN ) 1 % GEL Apply 4 g topically 4 (four) times daily.   ezetimibe  (ZETIA ) 10 MG tablet TAKE ONE TABLET BY MOUTH DAILY. MUST MAKE APPOINTMENT FOR FURTHER REFILLS   fluocinonide cream (LIDEX) 0.05 % every other day.   fluticasone (FLONASE) 50 MCG/ACT nasal spray Place 2 sprays into both nostrils as needed for allergies or rhinitis.   ipratropium (ATROVENT) 0.03 % nasal spray as needed for congestion.   iron polysaccharides (NIFEREX) 150 MG capsule Take 150 mg by mouth daily.   lidocaine  (XYLOCAINE ) 5 % ointment Apply topically daily as needed.   linaclotide  (LINZESS ) 145 MCG CAPS capsule Take 1 capsule (145 mcg total) by mouth as needed (irritable bowel).   Multiple Vitamin (MULTI-VITAMIN) tablet Take 1 tablet by mouth daily.   Olopatadine HCl 0.2 % SOLN Apply 1 drop to eye daily.   omeprazole  (PRILOSEC) 40 MG capsule Take 1 capsule (40 mg total) by mouth daily.   pravastatin  (PRAVACHOL ) 20 MG tablet Take 1 tablet (20 mg total) by mouth every evening.   promethazine -dextromethorphan (PROMETHAZINE -DM) 6.25-15 MG/5ML syrup Take 5 mLs by mouth every 6 (six) hours as needed for up to 10 days for cough.   triamcinolone  cream (KENALOG ) 0.1 % Apply topically 2 (two) times daily.   vitamin C (ASCORBIC  ACID) 500 MG tablet Take 500 mg by mouth.   DULoxetine  (CYMBALTA ) 30 MG capsule Take 1 capsule (30 mg total) by mouth daily. (Patient not taking:  Reported on 04/17/2024)   furosemide  (LASIX ) 40 MG tablet Take 1 tablet (40 mg total) by mouth daily. (Patient taking differently: Take 40 mg by mouth as needed.)   [DISCONTINUED] promethazine -dextromethorphan (PROMETHAZINE -DM) 6.25-15 MG/5ML syrup Take 5 mLs by mouth as directed.   No facility-administered encounter medications on file as of 04/17/2024.    Review of Systems:  Review of Systems  Constitutional:  Negative for appetite change, chills, fatigue and fever.  HENT:  Negative for congestion, hearing loss, rhinorrhea and sore throat.   Eyes: Negative.   Respiratory:  Positive for cough, shortness of breath and wheezing.   Cardiovascular:  Negative for chest pain, palpitations and leg swelling.  Gastrointestinal:  Negative for abdominal pain, constipation, diarrhea, nausea and vomiting.  Genitourinary:  Negative for dysuria.  Musculoskeletal:  Negative for arthralgias, back pain and myalgias.  Skin:  Negative for color change, rash and wound.  Neurological:  Negative for dizziness, weakness and headaches.  Psychiatric/Behavioral:  Negative for behavioral problems. The patient is not nervous/anxious.     Health Maintenance  Topic Date Due   DEXA SCAN  Never done   Medicare Annual Wellness (AWV)  04/27/2023   COVID-19 Vaccine (7 - 2024-25 season) 07/28/2023   INFLUENZA VACCINE  06/26/2024   DTaP/Tdap/Td (3 - Td or Tdap) 08/22/2027   Pneumonia Vaccine 52+ Years old  Completed   Zoster Vaccines- Shingrix  Completed   HPV VACCINES  Aged Out   Meningococcal B Vaccine  Aged Out    Physical Exam: Vitals:   04/17/24 1331  BP: (!) 136/90  Pulse: 75  Temp: (!) 96 F (35.6 C)  SpO2: 98%  Weight: 185 lb 9.6 oz (84.2 kg)  Height: 5' 5.5" (1.664 m)   Body mass index is 30.42 kg/m. Physical Exam Constitutional:      Appearance: She is obese.  HENT:     Head: Normocephalic and atraumatic.     Right Ear: There is impacted cerumen.     Left Ear: There is impacted cerumen.      Nose: Nose normal.     Mouth/Throat:     Mouth: Mucous membranes are moist.  Eyes:     Conjunctiva/sclera: Conjunctivae normal.  Cardiovascular:     Rate and Rhythm: Normal rate and regular rhythm.  Pulmonary:     Effort: Pulmonary effort is normal.     Breath sounds: Normal breath sounds.  Abdominal:     General: Bowel sounds are normal.     Palpations: Abdomen is soft.  Musculoskeletal:        General: Normal range of motion.     Cervical back: Normal range of motion.  Skin:    General: Skin is warm and dry.  Neurological:     General: No focal deficit present.     Mental Status: She is alert and oriented to person, place, and time.  Psychiatric:        Mood and Affect: Mood normal.        Behavior: Behavior normal.        Thought Content: Thought content normal.        Judgment: Judgment normal.     Labs reviewed: Basic Metabolic Panel: Recent Labs    08/14/23 1130 01/02/24 0932  NA 139 138  K 4.2 4.1  CL 103 103  CO2 24  26  GLUCOSE 83 90  BUN 18 20  CREATININE 0.85 0.88  CALCIUM  9.0 9.5  TSH  --  2.85   Liver Function Tests: Recent Labs    01/02/24 0932  AST 18  ALT 9  BILITOT 0.2  PROT 7.2   No results for input(s): "LIPASE", "AMYLASE" in the last 8760 hours. No results for input(s): "AMMONIA" in the last 8760 hours. CBC: Recent Labs    08/14/23 1130 01/02/24 0932  WBC  --  6.4  NEUTROABS  --  2,182  HGB  --  11.1*  HCT 35.8 37.1  MCV  --  82.6  PLT  --  199   Lipid Panel: Recent Labs    01/02/24 0932  CHOL 152  HDL 67  LDLCALC 72  TRIG 58  CHOLHDL 2.3   Lab Results  Component Value Date   HGBA1C 6.0 (H) 12/10/2016    Procedures since last visit: No results found.  Assessment/Plan  1. Acute bronchitis, bacterial (Primary) Cough, unspecified type Sore throat -  Chronic bronchitis with acute exacerbation, likely bacterial. Negative COVID and strep tests.  -  Prescribed Augmentin  875 mg/125 mg twice daily for 10 days. -   Prescribed promethazine  5 mL every 6 hours as needed for cough. -  - Advised adequate nutrition and hydration. - amoxicillin -clavulanate (AUGMENTIN ) 875-125 MG tablet; Take 1 tablet by mouth 2 (two) times daily for 7 days.  Dispense: 14 tablet; Refill: 0 - promethazine -dextromethorphan (PROMETHAZINE -DM) 6.25-15 MG/5ML syrup; Take 5 mLs by mouth every 6 (six) hours as needed for up to 10 days for cough.  Dispense: 200 mL; Refill: 0 - POC COVID-19 - POC Rapid Strep A - POCT Influenza A/B  2. Bilateral impacted cerumen - will start on Debrox otic drops  5 drops to bilateral ears BID X 4 days and follow up in 4 days for ear irrigation - carbamide peroxide (DEBROX) 6.5 % OTIC solution; Place 5 drops into both ears 2 (two) times daily for 4 days.     Labs/tests ordered:  - POC COVID-19 - POC Rapid Strep A - POCT Influenza A/B   Return in about 2 weeks (around 05/01/2024).  Kionna Brier Medina-Vargas, NP

## 2024-04-29 ENCOUNTER — Other Ambulatory Visit: Payer: Self-pay | Admitting: Family

## 2024-04-29 MED ORDER — ALPRAZOLAM 0.5 MG PO TABS: 0.5000 mg | ORAL_TABLET | Freq: Every evening | ORAL | 3 refills | Status: DC | PRN

## 2024-04-29 NOTE — Telephone Encounter (Signed)
 Copied from CRM [id number]. Topic: Clinical - Medication Refill >> Apr 29, 2024 11:28 AM Carrielelia G wrote: Medication: ALPRAZolam  (XANAX ) 0.5 MG tablet  Has the patient contacted their pharmacy? No (Agent: If no, request that the patient contact the pharmacy for the refill. If patient does not wish to contact the pharmacy document the reason why and proceed with request.) (Agent: If yes, when and what did the pharmacy advise?)  This is the patient's preferred pharmacy:  [facility] [place], [place] - [address] [facility] [address] [place] [address] Phone: [phone] Fax: [phone]   Is this the correct pharmacy for this prescription? Yes If no, delete pharmacy and type the correct one.    Is the patient out of the medication? Yes  Has the patient been seen for an appointment in the last year OR does the patient have an upcoming appointment? Yes  Can we respond through MyChart? No  Agent: Please be advised that Rx refills may take up to 3 business days. We ask that you follow-up with your pharmacy.

## 2024-05-20 DIAGNOSIS — M1712 Unilateral primary osteoarthritis, left knee: Secondary | ICD-10-CM | POA: Diagnosis not present

## 2024-05-27 DIAGNOSIS — M1712 Unilateral primary osteoarthritis, left knee: Secondary | ICD-10-CM | POA: Diagnosis not present

## 2024-06-03 DIAGNOSIS — M1712 Unilateral primary osteoarthritis, left knee: Secondary | ICD-10-CM | POA: Diagnosis not present
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
## 2024-07-01 ENCOUNTER — Ambulatory Visit: Payer: Medicare Other | Admitting: Neurology

## 2024-07-02 ENCOUNTER — Ambulatory Visit: Payer: Medicare Other | Admitting: Family

## 2024-07-08 DIAGNOSIS — B351 Tinea unguium: Secondary | ICD-10-CM | POA: Diagnosis not present

## 2024-07-08 DIAGNOSIS — M79672 Pain in left foot: Secondary | ICD-10-CM | POA: Diagnosis not present

## 2024-07-08 DIAGNOSIS — M79671 Pain in right foot: Secondary | ICD-10-CM | POA: Diagnosis not present

## 2024-07-15 ENCOUNTER — Encounter: Payer: Self-pay | Admitting: Family

## 2024-07-15 ENCOUNTER — Ambulatory Visit (INDEPENDENT_AMBULATORY_CARE_PROVIDER_SITE_OTHER): Admitting: Family

## 2024-07-15 VITALS — BP 132/78 | HR 77 | Temp 97.9°F | Resp 17 | Ht 65.5 in | Wt 188.2 lb

## 2024-07-15 DIAGNOSIS — I10 Essential (primary) hypertension: Secondary | ICD-10-CM | POA: Diagnosis not present

## 2024-07-15 DIAGNOSIS — I739 Peripheral vascular disease, unspecified: Secondary | ICD-10-CM | POA: Diagnosis not present

## 2024-07-15 DIAGNOSIS — I872 Venous insufficiency (chronic) (peripheral): Secondary | ICD-10-CM

## 2024-07-15 DIAGNOSIS — K219 Gastro-esophageal reflux disease without esophagitis: Secondary | ICD-10-CM | POA: Diagnosis not present

## 2024-07-15 DIAGNOSIS — R059 Cough, unspecified: Secondary | ICD-10-CM

## 2024-07-15 MED ORDER — OMEPRAZOLE 40 MG PO CPDR: 40.0000 mg | DELAYED_RELEASE_CAPSULE | Freq: Every day | ORAL | 1 refills | Status: AC

## 2024-07-15 MED ORDER — AMOXICILLIN-POT CLAVULANATE 875-125 MG PO TABS: 1.0000 | ORAL_TABLET | Freq: Two times a day (BID) | ORAL | 0 refills | Status: AC

## 2024-07-15 MED ORDER — FLUOCINONIDE 0.05 % EX CREA: TOPICAL_CREAM | CUTANEOUS | 3 refills | Status: DC

## 2024-07-15 NOTE — Patient Instructions (Signed)
 1.Patient will go to local pharmacy and receive Covid vaccine. 2.Patient will stop at check out and schedule Annual Wellness Visit.

## 2024-07-15 NOTE — Progress Notes (Signed)
 Provider: Roxan Plough FNP-C   Asa Baudoin, Roxan BROCKS, NP  Patient Care Team: Augusta Mirkin, Roxan BROCKS, NP as PCP - General (Family Medicine) Santo Stanly LABOR, MD as PCP - Cardiology (Cardiology)  Extended Emergency Contact Information Primary Emergency Contact: Bingham,Adrian  [place] Home Phone: [phone] Mobile Phone: [phone] Relation: Son Secondary Emergency Contact: Owens Cones Address: [address]          [place], [place] [address] [place] Home Phone: [phone] Work Phone: [phone] Relation: Daughter  Code Status:  Full Code  Goals of care: Advanced Directive information    07/15/2024   11:19 AM  Advanced Directives  Does Patient Have a Medical Advance Directive? Yes  Type of Estate agent of [place];Living will  Does patient want to make changes to medical advance directive? No - Patient declined  Copy of Healthcare Power of Attorney in Chart? No - copy requested     Chief Complaint  Patient presents with   Medical Management of Chronic Issues    6 Month follow up   Discussed the use of AI scribe software for clinical note transcription with the patient, who gave verbal consent to proceed.  History of Present Illness   Angela Wang is a 89 year old female who presents for a six-month follow-up visit.  She has been experiencing symptoms consistent with a cold for over two weeks, including nasal congestion and intermittent chills, but no fever. Her appetite remains good. She self-manages with amoxicillin , Flonase nasal spray, and promethazine  cough syrup as needed.  She reports swelling in her legs and occasional pain radiating down the leg, which she associates with her known neuropathy. She uses Voltaren  gel on her knees and applies triamcinolone  cream to her legs for dry skin, which she moisturizes regularly.  Her current medications include omeprazole  40 mg, Lindex 0.5% topical, B  complex, CoQ10, multivitamin, vitamin C, calcium , magnesium, vitamin D , iron, folic acid, Flexeril  as needed, Dulcolax as needed, Zetia  10 mg, furosemide  40 mg as needed, pravastatin  20 mg, and alprazolam  as needed. She has discontinued Cymbalta .  No fever, but she reports feeling hot and cold. She confirms nasal congestion and denies any recent illness in family members.    Past Medical History:  Diagnosis Date   ALLERGIC RHINITIS 06/21/2007   Qualifier: Diagnosis of  By: Georgian ROSALEA CHARM Lamar    Allergy    allergic rhinitis   Anemia    nos   ANEMIA-NOS 06/21/2007   Qualifier: Diagnosis of  By: Georgian ROSALEA CHARM Lamar    ANXIETY 06/21/2007   Qualifier: Diagnosis of  By: Georgian ROSALEA CHARM Lamar    Arthritis    osteoarthritis   Atypical chest pain 09/2008   negative myoview   BACK PAIN, LUMBAR 03/06/2010   Qualifier: Diagnosis of  By: Georgian ROSALEA CHARM Lamar    CEREBRAL ANEURYSM 06/21/2007   Qualifier: History of  By: Georgian ROSALEA CHARM Lamar    CEREBROVASCULAR ACCIDENT, HX OF 06/21/2007   Qualifier: Diagnosis of  By: Georgian ROSALEA CHARM Lamar    CHEST PAIN, ATYPICAL 09/02/2008   Qualifier: Diagnosis of  By: Georgian ROSALEA CHARM Lamar    Chronic sinusitis    CONSTIPATION, CHRONIC 09/20/2009   Qualifier: Diagnosis of  By: Obie MD, Princella HERO    Depression    DEPRESSION 06/21/2007   Qualifier: Diagnosis of  By: Georgian ROSALEA CHARM Lamar    Diverticulosis    DIVERTICULOSIS-COLON 06/09/2008   Qualifier: Diagnosis of  By: Kerman NP, Paula     Fibromyalgia    FIBROMYALGIA 06/21/2007   Qualifier: Diagnosis of  By: Georgian ROSALEA CHARM Lamar SERENE, HX OF 08/25/2008   Qualifier: Diagnosis of  By: Marcelo CMA (AAMA), Dottie     GERD 05/03/2008   Qualifier: Diagnosis of  By: Latisha CMA, Leigh     GERD (gastroesophageal reflux disease)    Headache 03/15/2011   History of solitary pulmonary nodule    right, stable by CT   Hyperlipidemia    HYPERLIPIDEMIA 06/21/2007   Qualifier: Diagnosis of  By: Georgian ROSALEA CHARM Lamar    Hypertension     HYPERTENSION 06/21/2007   Qualifier: Diagnosis of  By: Georgian ROSALEA CHARM Lamar    HYPOGLYCEMIA 10/24/2010   Qualifier: Diagnosis of  By: Georgian ROSALEA CHARM Lamar    HYPOTHYROIDISM 07/22/2008   Qualifier: Diagnosis of  By: Georgian ROSALEA CHARM Lamar    IBS 06/21/2007   Qualifier: Diagnosis of  By: Georgian ROSALEA CHARM Lamar    IBS (irritable bowel syndrome)    INSOMNIA, CHRONIC 07/19/2009   Qualifier: Diagnosis of  By: Georgian ROSALEA CHARM Lamar    LEG PAIN, RIGHT 02/01/2009   Qualifier: Diagnosis of  By: Georgian ROSALEA CHARM Lamar    Leukopenia    chronic   LEUKOPENIA, CHRONIC 06/21/2007   Qualifier: Diagnosis of  By: Georgian ROSALEA CHARM Lamar    NECK PAIN 02/03/2008   Qualifier: Diagnosis of  By: Georgian ROSALEA CHARM Lamar    NUMBNESS 01/29/2008   Qualifier: Diagnosis of  By: Kassie MD, Alyce DELENA Pyles NOS-Unspec 06/21/2007   Centricity Description: DEGENERATIVE JOINT DISEASE, MILD Qualifier: Diagnosis of  By: Georgian ROSALEA CHARM Lamar  Centricity Description: OSTEOARTHRITIS Qualifier: Diagnosis of  By: Georgian ROSALEA CHARM Lamar    PULMONARY NODULE 12/26/2007   Qualifier: Diagnosis of  By: Georgian ROSALEA CHARM Lamar    Routine general medical examination at a health care facility    SCIATICA, RIGHT 05/06/2008   Qualifier: Diagnosis of  By: Vicenta LATHER, Sarah     Stroke [facility])    THROMBOCYTOPENIA 02/09/2009   Qualifier: Diagnosis of  By: Georgian ROSALEA CHARM Lamar    UNSTEADY GAIT 07/28/2009   Qualifier: Diagnosis of  By: Georgian ROSALEA CHARM Lamar    VENOUS INSUFFICIENCY 05/03/2008   Qualifier: Diagnosis of  By: Latisha CMA, Anette     Past Surgical History:  Procedure Laterality Date   ABDOMINAL HYSTERECTOMY     ANEURYSM COILING     APPENDECTOMY     CHOLECYSTECTOMY     JOINT REPLACEMENT     total right knee replacement   KNEE SURGERY      Allergies  Allergen Reactions   Amitriptyline Hcl Swelling and Other (See Comments)    Headache   Cyclobenzaprine  Swelling   Escitalopram Oxalate     REACTION: Hallucinations  Other Reaction(s): Mental Status Changes, Psychosis   Tramadol  Nausea  And Vomiting and Other (See Comments)    Other Reaction(s): GI Intolerance   Furosemide  Nausea And Vomiting    Abdominal pain  Other Reaction(s): GI Intolerance, Other (See Comments)   Hydrocodone Other (See Comments)    Other Reaction(s): GI Intolerance   Hydrocodone-Acetaminophen      REACTION: stomach upset   Other Swelling    Other reaction(s): Other (See Comments) ANASTHESIA   Oxycodone -Acetaminophen      REACTION: Nausea and upset stomach   Terbinafine Other (See Comments)   Aspirin Other (See Comments)  REACTION: stomach upset   Baclofen Other (See Comments)    Other Reaction(s): GI Intolerance   Citalopram Other (See Comments)    Other Reaction(s): GI Intolerance   Codeine  Nausea Only and Other (See Comments)    REACTION: stomach upset  Other Reaction(s): GI Intolerance   Diazepam      Other Reaction(s): GI Intolerance   Escitalopram Oxalate     Other Reaction(s): GI Intolerance   Gabapentin      GI Upset  Other Reaction(s): GI Intolerance, Other (See Comments)   Hydrocodone-Acetaminophen      Other Reaction(s): GI Intolerance  Headache   Lactulose Other (See Comments)    Passed out  Other Reaction(s): Other (See Comments)   Latex Rash    REACTION: rash REACTION: rash REACTION: rash   Levofloxacin Nausea And Vomiting and Nausea Only    Other Reaction(s): GI Intolerance, Other (See Comments)   Lorazepam Other (See Comments)    REACTION: Throat Swelling  Other Reaction(s): GI Intolerance, Other (See Comments)  Other reaction(s): Other (See Comments), REACTION: Throat Swelling   Moxifloxacin Nausea And Vomiting and Nausea Only    Other Reaction(s): GI Intolerance, Other (See Comments)   Oxycodone  Other (See Comments)    Headache, change in mental status  Other Reaction(s): Mental Status Changes, Other (See Comments), Psychosis  Headache   Pantoprazole Nausea Only and Other (See Comments)    Other Reaction(s): GI Intolerance, Other (See Comments)    Polyethylene Glycol 3350 Other (See Comments)    Makes her bloated  Other Reaction(s): GI Intolerance   Rabeprazole Nausea Only and Other (See Comments)    Other Reaction(s): GI Intolerance   Terfenadine     REACTION: stomach upset  Other Reaction(s): GI Intolerance    Allergies as of 07/15/2024       Reactions   Amitriptyline Hcl Swelling, Other (See Comments)   Headache   Cyclobenzaprine  Swelling   Escitalopram Oxalate    REACTION: Hallucinations Other Reaction(s): Mental Status Changes, Psychosis   Tramadol  Nausea And Vomiting, Other (See Comments)   Other Reaction(s): GI Intolerance   Furosemide  Nausea And Vomiting   Abdominal pain Other Reaction(s): GI Intolerance, Other (See Comments)   Hydrocodone Other (See Comments)   Other Reaction(s): GI Intolerance   Hydrocodone-acetaminophen     REACTION: stomach upset   Other Swelling   Other reaction(s): Other (See Comments) ANASTHESIA   Oxycodone -acetaminophen     REACTION: Nausea and upset stomach   Terbinafine Other (See Comments)   Aspirin Other (See Comments)   REACTION: stomach upset   Baclofen Other (See Comments)   Other Reaction(s): GI Intolerance   Citalopram Other (See Comments)   Other Reaction(s): GI Intolerance   Codeine  Nausea Only, Other (See Comments)   REACTION: stomach upset Other Reaction(s): GI Intolerance   Diazepam     Other Reaction(s): GI Intolerance   Escitalopram Oxalate    Other Reaction(s): GI Intolerance   Gabapentin     GI Upset Other Reaction(s): GI Intolerance, Other (See Comments)   Hydrocodone-acetaminophen     Other Reaction(s): GI Intolerance Headache   Lactulose Other (See Comments)   Passed out Other Reaction(s): Other (See Comments)   Latex Rash   REACTION: rash REACTION: rash REACTION: rash   Levofloxacin Nausea And Vomiting, Nausea Only   Other Reaction(s): GI Intolerance, Other (See Comments)   Lorazepam Other (See Comments)   REACTION: Throat Swelling Other  Reaction(s): GI Intolerance, Other (See Comments) Other reaction(s): Other (See Comments), REACTION: Throat Swelling   Moxifloxacin Nausea And Vomiting, Nausea Only  Other Reaction(s): GI Intolerance, Other (See Comments)   Oxycodone  Other (See Comments)   Headache, change in mental status Other Reaction(s): Mental Status Changes, Other (See Comments), Psychosis Headache   Pantoprazole Nausea Only, Other (See Comments)   Other Reaction(s): GI Intolerance, Other (See Comments)   Polyethylene Glycol 3350 Other (See Comments)   Makes her bloated Other Reaction(s): GI Intolerance   Rabeprazole Nausea Only, Other (See Comments)   Other Reaction(s): GI Intolerance   Terfenadine    REACTION: stomach upset Other Reaction(s): GI Intolerance        Medication List        Accurate as of July 15, 2024 12:51 PM. If you have any questions, ask your nurse or doctor.          STOP taking these medications    DULoxetine  30 MG capsule Commonly known as: Cymbalta  Stopped by: Shamyah Stantz C Evans Levee       TAKE these medications    ALPRAZolam  0.5 MG tablet Commonly known as: XANAX  Take 1 tablet (0.5 mg total) by mouth at bedtime as needed for sleep or anxiety.   amoxicillin -clavulanate 875-125 MG tablet Commonly known as: AUGMENTIN  Take 1 tablet by mouth 2 (two) times daily for 7 days.   ascorbic acid 500 MG tablet Commonly known as: VITAMIN C Take 500 mg by mouth.   B COMPLEX 100 PO Take 1 tablet by mouth daily.   Calcium -Magnesium-Vitamin D  500-250-200 MG-MG-UNIT Tabs Take 1 tablet by mouth daily.   co-enzyme Q-10 30 MG capsule Take 30 mg by mouth 1 day or 1 dose.   cyclobenzaprine  10 MG tablet Commonly known as: FLEXERIL  Take 1 tablet (10 mg total) by mouth 2 (two) times daily as needed for muscle spasms.   diclofenac  Sodium 1 % Gel Commonly known as: VOLTAREN  Apply 4 g topically 4 (four) times daily.   Dulcolax 5 MG EC tablet Generic drug: bisacodyl as needed for  moderate constipation or severe constipation.   ezetimibe  10 MG tablet Commonly known as: ZETIA  TAKE ONE TABLET BY MOUTH DAILY. MUST MAKE APPOINTMENT FOR FURTHER REFILLS   fluocinonide  cream 0.05 % Commonly known as: LIDEX  Apply topically every other day. What changed: how to take this Changed by: Emery Dupuy C Kimbree Casanas   fluticasone 50 MCG/ACT nasal spray Commonly known as: FLONASE Place 2 sprays into both nostrils as needed for allergies or rhinitis.   folic acid 1 MG tablet Commonly known as: FOLVITE Take 1 mg by mouth daily.   furosemide  40 MG tablet Commonly known as: LASIX  Take 1 tablet (40 mg total) by mouth daily. What changed:  when to take this reasons to take this   ipratropium 0.03 % nasal spray Commonly known as: ATROVENT as needed for congestion.   iron polysaccharides 150 MG capsule Commonly known as: NIFEREX Take 150 mg by mouth daily.   lidocaine  5 % ointment Commonly known as: XYLOCAINE  Apply topically daily as needed.   linaclotide  145 MCG Caps capsule Commonly known as: Linzess  Take 1 capsule (145 mcg total) by mouth as needed (irritable bowel).   Multi-Vitamin tablet Take 1 tablet by mouth daily.   Olopatadine HCl 0.2 % Soln Apply 1 drop to eye daily.   omeprazole  40 MG capsule Commonly known as: PRILOSEC Take 1 capsule (40 mg total) by mouth daily.   pravastatin  20 MG tablet Commonly known as: PRAVACHOL  Take 1 tablet (20 mg total) by mouth every evening.   promethazine -dextromethorphan 6.25-15 MG/5ML syrup Commonly known as: PROMETHAZINE -DM Take 5 mLs by  mouth as needed.   triamcinolone  cream 0.1 % Commonly known as: KENALOG  Apply topically 2 (two) times daily.        Review of Systems  Constitutional:  Negative for appetite change, chills, fatigue, fever and unexpected weight change.  HENT:  Negative for congestion, dental problem, ear discharge, ear pain, hearing loss, nosebleeds, postnasal drip, rhinorrhea, sinus pressure, sinus  pain, sneezing, sore throat, tinnitus and trouble swallowing.   Eyes:  Negative for pain, discharge, redness, itching and visual disturbance.  Respiratory:  Negative for cough, chest tightness, shortness of breath and wheezing.   Cardiovascular:  Negative for chest pain, palpitations and leg swelling.  Gastrointestinal:  Negative for abdominal distention, abdominal pain, blood in stool, constipation, diarrhea, nausea and vomiting.  Endocrine: Negative for cold intolerance, heat intolerance, polydipsia, polyphagia and polyuria.  Genitourinary:  Negative for difficulty urinating, dysuria, flank pain, frequency and urgency.  Musculoskeletal:  Positive for gait problem. Negative for arthralgias, back pain, joint swelling, myalgias, neck pain and neck stiffness.  Skin:  Positive for color change. Negative for pallor, rash and wound.       Dry skin on both legs  Neurological:  Positive for numbness. Negative for dizziness, syncope, speech difficulty, weakness, light-headedness and headaches.  Hematological:  Does not bruise/bleed easily.  Psychiatric/Behavioral:  Negative for agitation, behavioral problems, confusion, hallucinations, self-injury, sleep disturbance and suicidal ideas. The patient is not nervous/anxious.     Immunization History  Administered Date(s) Administered   Influenza Whole 09/12/2008, 09/12/2009, 08/15/2010   Influenza, High Dose Seasonal PF 08/26/2019   Influenza-Unspecified 08/26/2023   Moderna Sars-Covid-2 Vaccination 10/18/2020, 05/08/2021   PFIZER(Purple Top)SARS-COV-2 Vaccination 01/01/2020, 01/22/2020   PNEUMOCOCCAL CONJUGATE-20 08/26/2023   Pfizer Covid-19 Vaccine Bivalent Booster 3yrs & up 12/06/2021, 10/31/2022   Pneumococcal Conjugate-13 12/03/2013   Pneumococcal Polysaccharide-23 11/13/1999, 10/16/2004, 01/11/2015   Pneumococcal-Unspecified 12/03/2013   Smallpox 09/12/1964   Td 02/24/2004   Tdap 08/21/2017   Zoster Recombinant(Shingrix) 08/26/2019,  11/07/2019   Zoster, Live 11/26/2012   Pertinent  Health Maintenance Due  Topic Date Due   INFLUENZA VACCINE  02/23/2025 (Originally 06/26/2024)   DEXA SCAN  05/31/2026 (Originally 03/17/1998)      12/20/2022   10:48 AM 12/26/2023   12:55 PM 12/31/2023    2:09 PM 02/19/2024   10:08 AM 07/15/2024   11:18 AM  Fall Risk  Falls in the past year? 0 0 0 0 0  Was there an injury with Fall? 0 0 0 0 0  Fall Risk Category Calculator 0 0 0 0 0  Patient at Risk for Falls Due to   Impaired balance/gait;Impaired mobility History of fall(s) No Fall Risks  Fall risk Follow up   Falls evaluation completed Falls evaluation completed Falls evaluation completed   Functional Status Survey:    Vitals:   07/15/24 1127  BP: 132/78  Pulse: 77  Resp: 17  Temp: 97.9 F (36.6 C)  SpO2: 96%  Weight: 188 lb 3.2 oz (85.4 kg)  Height: 5' 5.5 (1.664 m)   Body mass index is 30.84 kg/m. Physical Exam   VITALS: T- 97.9, P- 77, BP- 132/78, SaO2- 96% MEASUREMENTS: Weight- 188.2. GENERAL: Alert, cooperative, well developed, no acute distress HEENT: Normocephalic, normal oropharynx, moist mucous membranes, ears normal bilaterally, nose normal, sinus tenderness on right side NECK: Neck normal CHEST: Clear to auscultation bilaterally, no wheezes, rhonchi, or crackles CARDIOVASCULAR: Normal heart rate and rhythm, S1 and S2 normal without murmurs ABDOMEN: Soft, non-tender, non-distended, without organomegaly, normal bowel sounds EXTREMITIES: No  cyanosis or edema, no calf muscle tenderness NEUROLOGICAL: Cranial nerves grossly intact, moves all extremities without gross motor or sensory deficit SKIN: Bilateral Thick Dry skin on lower extremities  PSYCHIATRY/BEHAVIORAL: Mood stable    Labs reviewed: Recent Labs    08/14/23 1130 01/02/24 0932  NA 139 138  K 4.2 4.1  CL 103 103  CO2 24 26  GLUCOSE 83 90  BUN 18 20  CREATININE 0.85 0.88  CALCIUM  9.0 9.5   Recent Labs    01/02/24 0932  AST 18  ALT 9   BILITOT 0.2  PROT 7.2   Recent Labs    08/14/23 1130 01/02/24 0932  WBC  --  6.4  NEUTROABS  --  2,182  HGB  --  11.1*  HCT 35.8 37.1  MCV  --  82.6  PLT  --  199   Lab Results  Component Value Date   TSH 2.85 01/02/2024   Lab Results  Component Value Date   HGBA1C 6.0 (H) 12/10/2016   Lab Results  Component Value Date   CHOL 152 01/02/2024   HDL 67 01/02/2024   LDLCALC 72 01/02/2024   TRIG 58 01/02/2024   CHOLHDL 2.3 01/02/2024    Significant Diagnostic Results in last 30 days:  No results found.  Assessment/Plan    Acute upper respiratory infection with right maxillary sinus involvement Acute upper respiratory infection with symptoms persisting for over two weeks. No fever, but experiences alternating hot and cold sensations. Right maxillary sinus tenderness indicates sinus involvement. 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 Reports pain radiating down the leg. Dry, dark skin noted on lower extremities. Moisturizer has been effective, but continued application is necessary. - Refer to vascular specialist for further evaluation - Continue using moisturizer on lower extremities twice daily  Hypertension Blood pressure is well-controlled at 132/78 mmHg. - continue diuretic   Hyperlipidemia Continues to manage hyperlipidemia with Zetia  10 mg.  Gastroesophageal reflux disease (GERD) GERD managed with omeprazole  40 mg. - Refill omeprazole  40 mg  Constipation Constipation managed with Dulcolax as needed.  Anxiety disorder Anxiety disorder managed with alprazolam  as needed.   Family/ staff Communication: Reviewed plan of care with patient  and daughter verbalized understanding   Labs/tests ordered: - CBC with Differential/Platelet - CMP with  eGFR([facility]) - TSH - Lipid panel   Next Appointment : Return in about 6 months (around 01/15/2025).   Spent 30 minutes of Face to face and non-face to face with patient  >50% time spent counseling; reviewing medical record; tests; labs; documentation and developing future plan of care.   Roxan JAYSON Plough, NP

## 2024-07-16 LAB — COMPLETE METABOLIC PANEL WITHOUT GFR
AG Ratio: 1.1 (calc) (ref 1.0–2.5)
ALT: 14 U/L (ref 6–29)
AST: 20 U/L (ref 10–35)
Albumin: 3.9 g/dL (ref 3.6–5.1)
Alkaline phosphatase (APISO): 77 U/L (ref 37–153)
BUN: 21 mg/dL (ref 7–25)
CO2: 28 mmol/L (ref 20–32)
Calcium: 9.5 mg/dL (ref 8.6–10.4)
Chloride: 101 mmol/L (ref 98–110)
Creat: 0.8 mg/dL (ref 0.60–0.95)
Globulin: 3.7 g/dL (ref 1.9–3.7)
Glucose, Bld: 89 mg/dL (ref 65–99)
Potassium: 4.8 mmol/L (ref 3.5–5.3)
Sodium: 137 mmol/L (ref 135–146)
Total Bilirubin: 0.4 mg/dL (ref 0.2–1.2)
Total Protein: 7.6 g/dL (ref 6.1–8.1)

## 2024-07-16 LAB — CBC WITH DIFFERENTIAL/PLATELET
Absolute Lymphocytes: 2234 {cells}/uL (ref 850–3900)
Absolute Monocytes: 577 {cells}/uL (ref 200–950)
Basophils Absolute: 50 {cells}/uL (ref 0–200)
Basophils Relative: 0.9 %
Eosinophils Absolute: 179 {cells}/uL (ref 15–500)
Eosinophils Relative: 3.2 %
HCT: 37.7 % (ref 35.0–45.0)
Hemoglobin: 11.3 g/dL — ABNORMAL LOW (ref 11.7–15.5)
MCH: 25 pg — ABNORMAL LOW (ref 27.0–33.0)
MCHC: 30 g/dL — ABNORMAL LOW (ref 32.0–36.0)
MCV: 83.4 fL (ref 80.0–100.0)
MPV: 11.6 fL (ref 7.5–12.5)
Monocytes Relative: 10.3 %
Neutro Abs: 2559 {cells}/uL (ref 1500–7800)
Neutrophils Relative %: 45.7 %
Platelets: 230 Thousand/uL (ref 140–400)
RBC: 4.52 Million/uL (ref 3.80–5.10)
RDW: 14.3 % (ref 11.0–15.0)
Total Lymphocyte: 39.9 %
WBC: 5.6 Thousand/uL (ref 3.8–10.8)

## 2024-07-16 LAB — LIPID PANEL
Cholesterol: 195 mg/dL (ref <200)
HDL: 78 mg/dL (ref >=50)
LDL Cholesterol (Calc): 103 mg/dL — ABNORMAL HIGH
Non-HDL Cholesterol (Calc): 117 mg/dL (ref <130)
Total CHOL/HDL Ratio: 2.5 (calc) (ref <5.0)
Triglycerides: 55 mg/dL (ref <150)

## 2024-07-16 LAB — TSH: TSH: 2.55 m[IU]/L (ref 0.40–4.50)

## 2024-07-22 ENCOUNTER — Ambulatory Visit: Payer: Self-pay | Admitting: Family

## 2024-07-22 ENCOUNTER — Ambulatory Visit: Payer: Self-pay

## 2024-07-22 NOTE — Telephone Encounter (Signed)
 Noted

## 2024-07-22 NOTE — Telephone Encounter (Signed)
 Routed to provider that patient is seeing tomorrow.

## 2024-07-22 NOTE — Telephone Encounter (Signed)
 FYI Only or Action Required?: FYI only for provider.  Patient was last seen in primary care on 07/15/2024 by Ngetich, Roxan BROCKS, NP.  Called Nurse Triage reporting Sore Throat.  Symptoms began today.  Interventions attempted: Nothing.  Symptoms are: gradually worsening.  Triage Disposition: See PCP When Office is Open (Within 3 Days)  Patient/caregiver understands and will follow disposition?: Yes   Copied from CRM #[id number]. Topic: Clinical - Red Word Triage >> Jul 22, 2024 11:51 AM Carmell SAUNDERS wrote: Red Word that prompted transfer to Nurse Triage: Shortness of breath, cant hardly breathe. Ears, nose, and throat swollen/sore, weakness. Body aching, lightheaded and head pain, nose stopped up. Wants to come in for an appt. Reason for Disposition  [1] Sore throat is the only symptom AND [2] present > 48 hours  Answer Assessment - Initial Assessment Questions 1. ONSET: When did the throat start hurting? (Hours or days ago)      today 2. SEVERITY: How bad is the sore throat? (Scale 1-10; mild, moderate or severe)     moderate 3. STREP EXPOSURE: Has there been any exposure to strep within the past week? If Yes, ask: What type of contact occurred?      na 4.  VIRAL SYMPTOMS: Are there any symptoms of a cold, such as a runny nose, cough, hoarse voice or red eyes?      Ears-bilateral stopped, sore throat, weakness, headache 5. FEVER: Do you have a fever? If Yes, ask: What is your temperature, how was it measured, and when did it start?     no 6. PUS ON THE TONSILS: Is there pus on the tonsils in the back of your throat?     unknown 7. OTHER SYMPTOMS: Do you have any other symptoms? (e.g., difficulty breathing, headache, rash)     Headache, trouble swallowing 8. PREGNANCY: Is there any chance you are pregnant? When was your last menstrual period?     Na  Pt does not have SOB: pt states it is hard to swallow - feels like something is stuck in her throat  Protocols  used: Sore Throat-A-AH

## 2024-07-23 ENCOUNTER — Ambulatory Visit: Admitting: Adult Health

## 2024-07-31 DIAGNOSIS — H6123 Impacted cerumen, bilateral: Secondary | ICD-10-CM | POA: Diagnosis not present

## 2024-08-17 ENCOUNTER — Other Ambulatory Visit: Payer: Self-pay | Admitting: Family

## 2024-08-17 ENCOUNTER — Other Ambulatory Visit: Payer: Self-pay | Admitting: Internal Medicine

## 2024-08-17 NOTE — Telephone Encounter (Unsigned)
 Copied from CRM [id number]. Topic: Clinical - Medication Refill >> Aug 17, 2024  3:49 PM Fredrica W wrote: Medication: pravastatin  (PRAVACHOL ) 20 MG tablet  Has the patient contacted their pharmacy? Yes (Agent: If no, request that the patient contact the pharmacy for the refill. If patient does not wish to contact the pharmacy document the reason why and proceed with request.) (Agent: If yes, when and what did the pharmacy advise?)  This is the patient's preferred pharmacy:  [facility] [place], [place] - [address] [place] [address] Phone: [phone] Fax: [phone]   Is this the correct pharmacy for this prescription? Yes If no, delete pharmacy and type the correct one.   Has the prescription been filled recently? No  Is the patient out of the medication? Yes  Has the patient been seen for an appointment in the last year OR does the patient have an upcoming appointment? Yes  Can we respond through MyChart? No  Agent: Please be advised that Rx refills may take up to 3 business days. We ask that you follow-up with your pharmacy.

## 2024-08-19 ENCOUNTER — Other Ambulatory Visit: Payer: Self-pay | Admitting: Family

## 2024-08-19 MED ORDER — ALPRAZOLAM 0.5 MG PO TABS: 0.5000 mg | ORAL_TABLET | Freq: Every evening | ORAL | 3 refills | Status: AC | PRN

## 2024-08-19 NOTE — Telephone Encounter (Signed)
 Patient has request for refill on 08/19/24. Patient last refill was 04/29/24. Patient has contract on file dated2/4/25. Patient has upcoming appointment 09/23/24. Updated contract/Sign contract was added to appointment notes. Medication pend and sent to PCP (Ngetich, Dinah C, NP) for approval.

## 2024-08-19 NOTE — Telephone Encounter (Unsigned)
 Copied from CRM #[id number]. Topic: Clinical - Medication Refill >> Aug 19, 2024 10:35 AM Susanna ORN wrote: Medication: ALPRAZolam  (XANAX ) 0.5 MG tablet  Has the patient contacted their pharmacy? Yes, states pharmacy will not fill the prescription because she doesn't have the number on the bottle.  (Agent: If no, request that the patient contact the pharmacy for the refill. If patient does not wish to contact the pharmacy document the reason why and proceed with request.) (Agent: If yes, when and what did the pharmacy advise?)  This is the patient's preferred pharmacy:  [facility] [place], [place] - [address] [place] [address] Phone: [phone] Fax: [phone]  Is this the correct pharmacy for this prescription? Yes If no, delete pharmacy and type the correct one.   Has the prescription been filled recently? Yes  Is the patient out of the medication? Yes  Has the patient been seen for an appointment in the last year OR does the patient have an upcoming appointment? Yes  Can we respond through MyChart? Yes  Agent: Please be advised that Rx refills may take up to 3 business days. We ask that you follow-up with your pharmacy.

## 2024-09-23 ENCOUNTER — Encounter: Admitting: Family

## 2024-09-23 NOTE — Progress Notes (Signed)
   This encounter was created in error - please disregard. No show

## 2024-09-28 ENCOUNTER — Other Ambulatory Visit: Payer: Self-pay

## 2024-09-28 ENCOUNTER — Encounter: Payer: Self-pay | Admitting: Radiology

## 2024-09-28 DIAGNOSIS — I872 Venous insufficiency (chronic) (peripheral): Secondary | ICD-10-CM

## 2024-10-05 NOTE — Progress Notes (Unsigned)
 Cardiology Office Note    Date:  10/06/2024  ID:  Angela Wang, DOB 1930/12/01, MRN [id number] PCP:  Leonarda Roxan BROCKS, NP  Cardiologist:  Stanly DELENA Leavens, MD  Electrophysiologist:  None   Chief Complaint: Chest discomfort  History of Present Illness: .   Angela Wang is a 89 y.o. female with visit-pertinent history of hypertension, prior cerebral aneurysm on 06/21/2007 per notes, PAD and history of palpitations.  First evaluated by Dr. Leavens in 2021.   In 2021 patient reported a fluttering feeling in her chest, no provoking or relieving factors.  Patient noted to have chronic lower extremity edema using compression stockings.  Echocardiogram on 11/01/2020 indicated LVEF 55 to 60%, no RWMA, mild concentric LVH, grade 1 diastolic dysfunction, RV systolic function size is normal, moderate pulm monic valve regurgitation, no evidence of mitral regurgitation, mild calcification the aortic valve.  ABIs in 12/2020 with no evidence of significant PAD.  Repeat echocardiogram in 11/2021 indicated LVEF 66 5%, no artery MI, moderate asymmetric LVH of the basal septal segment, diastolic parameters were indeterminate, RV systolic function size was normal, normal PASP, no evidence of mitral valve regurgitation or stenosis, mild calcification of the aortic valve.  Pulmonic valve regurgitation was mild.  Patient was last seen in clinic on 07/26/2023 by Dr. Leavens.  Patient reported that she was doing poorly as a result of GI problems.  Noted worsening lower extremity edema complicated by the fact she had problems trying to get into the bathroom.  Patient's Lasix  was increased to 40 mg daily with recommended follow-up with APP in 2 months.  Today patient presents regarding an occasional mid to right sided chest discomfort that has been occurring intermittently for the last month.  She reports it occurs when she lays down flat at night and is reproducible with palpation.  Patient notes that she is  not very active and does not walk far however denies any chest pain, pressure or tightness when she does try to walk.  She denies any increased shortness of breath, orthopnea or PND.  She does report significant history for problems with acid reflux and intermittent difficulties with swallowing.  On further discussion with patient she reports that she would not be interested in any stress testing or invasive testing such as a cardiac catheterization.  Patient reports that she primarily followed up today in order to receive refills on her pravastatin  and Zetia , reports that she has not been on Zetia  in recent months.  Labwork independently reviewed: 07/15/2024: Sodium 137, potassium 4.8, creatinine 0.80, AST 20, ALT 14, hemoglobin 11.3, hematocrit 37.7 ROS: .   Today she denies shortness of breath fatigue, palpitations, melena, hematuria, hemoptysis, diaphoresis, weakness, presyncope, syncope, orthopnea, and PND.  All other systems are reviewed and otherwise negative. Studies Reviewed: Angela Wang   EKG:  EKG is ordered today, personally reviewed, demonstrating  EKG Interpretation Date/Time:  Tuesday October 06 2024 08:29:09 EST Ventricular Rate:  86 PR Interval:  180 QRS Duration:  82 QT Interval:  354 QTC Calculation: 423 R Axis:   50  Text Interpretation: Normal sinus rhythm Nonspecific T wave abnormality Confirmed by Bradrick Kamau [phone]) on 10/06/2024 8:46:32 AM   CV Studies: Cardiac studies reviewed are outlined and summarized above. Otherwise please see EMR for full report. Cardiac Studies & Procedures   ______________________________________________________________________________________________     ECHOCARDIOGRAM  ECHOCARDIOGRAM COMPLETE 12/26/2021  Narrative ECHOCARDIOGRAM REPORT    Patient Name:   Angela Wang Date of Exam: 12/26/2021 Medical Rec #:  [id number]     Height:       65.0 in Accession #:    [id number]    Weight:       170.0 lb Date of Birth:  [birth year]     BSA:           1.846 m Patient Age:    89 years      BP:           131/79 mmHg Patient Gender: F             HR:           75 bpm. Exam Location:  [address]  Procedure: 2D Echo, Cardiac Doppler and Color Doppler  Indications:     I37.1 Pulmonary valve insufficiency  History:         Patient has prior history of Echocardiogram examinations, most recent 11/01/2020. PVD, Signs/Symptoms:Chest Pain; Risk Factors:Hypertension.  Sonographer:     Waldo Guadalajara RCS Referring Phys:  [id number] [facility] A SANTO Diagnosing Phys: Shelda Bruckner MD  IMPRESSIONS   1. Left ventricular ejection fraction, by estimation, is 60 to 65%. The left ventricle has normal function. The left ventricle has no regional wall motion abnormalities. There is moderate asymmetric left ventricular hypertrophy of the basal-septal segment. Left ventricular diastolic parameters are indeterminate. 2. Right ventricular systolic function is normal. The right ventricular size is normal. There is normal pulmonary artery systolic pressure. 3. Left atrial size was mildly dilated. 4. The mitral valve is normal in structure. No evidence of mitral valve regurgitation. No evidence of mitral stenosis. 5. The aortic valve is tricuspid. There is mild calcification of the aortic valve. Aortic valve regurgitation is not visualized. No aortic stenosis is present.  Comparison(s): Changes from prior study are noted. PR mild on current study.  Conclusion(s)/Recommendation(s): Otherwise normal echocardiogram, with minor abnormalities described in the report.  FINDINGS Left Ventricle: Left ventricular ejection fraction, by estimation, is 60 to 65%. The left ventricle has normal function. The left ventricle has no regional wall motion abnormalities. The left ventricular internal cavity size was normal in size. There is moderate asymmetric left ventricular hypertrophy of the basal-septal segment. Left ventricular diastolic parameters are  indeterminate.  Right Ventricle: The right ventricular size is normal. No increase in right ventricular wall thickness. Right ventricular systolic function is normal. There is normal pulmonary artery systolic pressure. The tricuspid regurgitant velocity is 2.52 m/s, and with an assumed right atrial pressure of 8 mmHg, the estimated right ventricular systolic pressure is 33.4 mmHg.  Left Atrium: Left atrial size was mildly dilated.  Right Atrium: Right atrial size was normal in size.  Pericardium: There is no evidence of pericardial effusion.  Mitral Valve: The mitral valve is normal in structure. No evidence of mitral valve regurgitation. No evidence of mitral valve stenosis.  Tricuspid Valve: The tricuspid valve is normal in structure. Tricuspid valve regurgitation is trivial. No evidence of tricuspid stenosis.  Aortic Valve: The aortic valve is tricuspid. There is mild calcification of the aortic valve. Aortic valve regurgitation is not visualized. No aortic stenosis is present.  Pulmonic Valve: The pulmonic valve was grossly normal. Pulmonic valve regurgitation is mild. No evidence of pulmonic stenosis.  Aorta: The aortic root, ascending aorta and aortic arch are all structurally normal, with no evidence of dilitation or obstruction.  Venous: The inferior vena cava was not well visualized.  IAS/Shunts: The atrial septum is grossly normal.   LEFT VENTRICLE PLAX 2D LVIDd:  3.30 cm   Diastology LVIDs:         2.10 cm   LV e' medial:    6.53 cm/s LV PW:         1.10 cm   LV E/e' medial:  8.9 LV IVS:        1.50 cm   LV e' lateral:   3.37 cm/s LVOT diam:     2.00 cm   LV E/e' lateral: 17.3 LV SV:         69 LV SV Index:   38 LVOT Area:     3.14 cm   RIGHT VENTRICLE RV Basal diam:  3.90 cm RV S prime:     12.70 cm/s RVSP:           28.4 mmHg  LEFT ATRIUM             Index        RIGHT ATRIUM           Index LA diam:        3.00 cm 1.63 cm/m   RA Pressure: 3.00  mmHg LA Vol (A2C):   53.8 ml 29.14 ml/m  RA Area:     8.98 cm LA Vol (A4C):   24.8 ml 13.43 ml/m  RA Volume:   15.30 ml  8.29 ml/m LA Biplane Vol: 39.9 ml 21.61 ml/m AORTIC VALVE LVOT Vmax:   82.20 cm/s LVOT Vmean:  54.800 cm/s LVOT VTI:    0.221 m  AORTA Ao Root diam: 3.10 cm Ao Asc diam:  3.20 cm  MITRAL VALVE                TRICUSPID VALVE MV Area (PHT):              TR Peak grad:   25.4 mmHg MV Decel Time:              TR Vmax:        252.00 cm/s MV E velocity: 58.20 cm/s   Estimated RAP:  3.00 mmHg MV A velocity: 105.00 cm/s  RVSP:           28.4 mmHg MV E/A ratio:  0.55 SHUNTS Systemic VTI:  0.22 m Systemic Diam: 2.00 cm  Shelda Bruckner MD Electronically signed by Shelda Bruckner MD Signature Date/Time: 12/26/2021/4:41:28 PM    Final (Updated)          ______________________________________________________________________________________________       Current Reported Medications:.    Current Meds  Medication Sig   ALPRAZolam  (XANAX ) 0.5 MG tablet Take 1 tablet (0.5 mg total) by mouth at bedtime as needed for sleep or anxiety.   B Complex Vitamins (B COMPLEX 100 PO) Take 1 tablet by mouth daily.   bisacodyl (DULCOLAX) 5 MG EC tablet as needed for moderate constipation or severe constipation.   Calcium -Magnesium-Vitamin D  500-250-200 MG-MG-UNIT TABS Take 1 tablet by mouth daily.   co-enzyme Q-10 30 MG capsule Take 30 mg by mouth 1 day or 1 dose.   cyclobenzaprine  (FLEXERIL ) 10 MG tablet Take 1 tablet (10 mg total) by mouth 2 (two) times daily as needed for muscle spasms.   diclofenac  Sodium (VOLTAREN ) 1 % GEL Apply 4 g topically 4 (four) times daily.   fluocinonide  cream (LIDEX ) 0.05 % Apply topically every other day.   fluticasone (FLONASE) 50 MCG/ACT nasal spray Place 2 sprays into both nostrils as needed for allergies or rhinitis.   folic acid (FOLVITE) 1 MG tablet Take 1 mg by mouth  daily.   ipratropium (ATROVENT) 0.03 % nasal spray as  needed for congestion.   iron polysaccharides (NIFEREX) 150 MG capsule Take 150 mg by mouth daily.   lidocaine  (XYLOCAINE ) 5 % ointment Apply topically daily as needed.   linaclotide  (LINZESS ) 145 MCG CAPS capsule Take 1 capsule (145 mcg total) by mouth as needed (irritable bowel).   Multiple Vitamin (MULTI-VITAMIN) tablet Take 1 tablet by mouth daily.   Olopatadine HCl 0.2 % SOLN Apply 1 drop to eye daily.   omeprazole  (PRILOSEC) 40 MG capsule Take 1 capsule (40 mg total) by mouth daily.   promethazine -dextromethorphan (PROMETHAZINE -DM) 6.25-15 MG/5ML syrup Take 5 mLs by mouth as needed.   vitamin C (ASCORBIC ACID) 500 MG tablet Take 500 mg by mouth.   [DISCONTINUED] furosemide  (LASIX ) 40 MG tablet Take 1 tablet (40 mg total) by mouth daily. (Patient taking differently: Take 40 mg by mouth as needed.)   [DISCONTINUED] pravastatin  (PRAVACHOL ) 20 MG tablet Take 1 tablet (20 mg total) by mouth every evening.    Physical Exam:    VS:  BP 134/74   Pulse 78   Ht 5' 5.5 (1.664 m)   Wt 187 lb (84.8 kg)   SpO2 98%   BMI 30.65 kg/m    Wt Readings from Last 3 Encounters:  10/06/24 187 lb (84.8 kg)  07/15/24 188 lb 3.2 oz (85.4 kg)  04/17/24 185 lb 9.6 oz (84.2 kg)    GEN: Well nourished, well developed in no acute distress NECK: No JVD; No carotid bruits CARDIAC: RRR, no murmurs, rubs, gallops RESPIRATORY:  Clear to auscultation without rales, wheezing or rhonchi  ABDOMEN: Soft, non-tender, non-distended EXTREMITIES:  No edema; No acute deformity     Asessement and Plan:.    Chest discomfort: Patient reports intermittent episodes of chest discomfort for the last month that occur primarily when she lays down flat at night and is reproducible in office today with palpation.  Patient reports episodes are typically fleeting lasting only a few seconds, denies any chest pain, pressure or tightness on exertion however notes that she does not exert herself significantly.  On further discussion  patient reports she is not interested in a cardiac stress test or any invasive measures.  Patient feels this is likely musculoskeletal or GI related noting history of acid reflux, she is unsure if she has been consistent with her Prilosec, plans to restart and continue.  She plans to notify the office if persistent or she has worsening symptoms. Reviewed ED precautions.  LE edema/PAD: Patient with history of lower extremity edema, also has noted some increased pain when trying to walk at home.  She does note a history of neuropathy.  Patient has follow-up with [facility] next month.  Patient notes that her legs are slightly swollen today, she has not taken her Lasix  in the last 2 days as she ran out of the prescription. On exam she has +1 bilateral edema, will refill lasix  today.  She will follow-up with [facility] scheduled.  Hyperlipidemia: Last lipid profile in 07/15/2024 indicated total cholesterol 195, HDL 78, triglycerides 55 and LDL 103.  Patient reports that she had not been taking pravastatin  and Zetia  consistently, was unaware she was post be taking the 2 medications.  Refills of pravastatin  and Zetia  provided, she will have recheck fasting lipid profile and LFTs in 8 weeks.  HTN: Blood pressure today 134/74.   Disposition: F/u with Dr. Santo in six months per patient request.   Signed, Radha Coggins D Ladawna Walgren, NP

## 2024-10-06 ENCOUNTER — Ambulatory Visit: Attending: Cardiology | Admitting: Cardiology

## 2024-10-06 ENCOUNTER — Ambulatory Visit: Admitting: Physician Assistant

## 2024-10-06 ENCOUNTER — Encounter: Payer: Self-pay | Admitting: Cardiology

## 2024-10-06 VITALS — BP 134/74 | HR 78 | Ht 65.5 in | Wt 187.0 lb

## 2024-10-06 DIAGNOSIS — D509 Iron deficiency anemia, unspecified: Secondary | ICD-10-CM

## 2024-10-06 DIAGNOSIS — R600 Localized edema: Secondary | ICD-10-CM | POA: Diagnosis not present

## 2024-10-06 DIAGNOSIS — R0789 Other chest pain: Secondary | ICD-10-CM | POA: Diagnosis not present

## 2024-10-06 DIAGNOSIS — E785 Hyperlipidemia, unspecified: Secondary | ICD-10-CM | POA: Diagnosis not present

## 2024-10-06 DIAGNOSIS — K5900 Constipation, unspecified: Secondary | ICD-10-CM

## 2024-10-06 DIAGNOSIS — I739 Peripheral vascular disease, unspecified: Secondary | ICD-10-CM | POA: Diagnosis not present

## 2024-10-06 DIAGNOSIS — I10 Essential (primary) hypertension: Secondary | ICD-10-CM

## 2024-10-06 MED ORDER — PRAVASTATIN SODIUM 20 MG PO TABS: 20.0000 mg | ORAL_TABLET | Freq: Every evening | ORAL | 3 refills | Status: AC

## 2024-10-06 MED ORDER — FUROSEMIDE 40 MG PO TABS: 40.0000 mg | ORAL_TABLET | Freq: Every day | ORAL | 3 refills | Status: AC

## 2024-10-06 MED ORDER — EZETIMIBE 10 MG PO TABS: ORAL_TABLET | ORAL | 3 refills | Status: AC

## 2024-10-06 NOTE — Patient Instructions (Signed)
 Medication Instructions:  Your physician recommends that you continue on your current medications as directed. Please refer to the Current Medication list given to you today.  *If you need a refill on your cardiac medications before your next appointment, please call your pharmacy*  Lab Work: THE WEEK OF 12/01/24, COME TO THE LAB, FASTING, FOR:  LIPID & LFT  If you have labs (blood work) drawn today and your tests are completely normal, you will receive your results only by: MyChart Message (if you have MyChart) OR A paper copy in the mail If you have any lab test that is abnormal or we need to change your treatment, we will call you to review the results.  Testing/Procedures: None ordered   Follow-Up: At [facility], you and your health needs are our priority.  As part of our continuing mission to provide you with exceptional heart care, our providers are all part of one team.  This team includes your primary Cardiologist (physician) and Advanced Practice Providers or APPs (Physician Assistants and Nurse Practitioners) who all work together to provide you with the care you need, when you need it.  Your next appointment:   6 month(s)  Provider:   Stanly DELENA Leavens, MD    We recommend signing up for the patient portal called MyChart.  Sign up information is provided on this After Visit Summary.  MyChart is used to connect with patients for Virtual Visits (Telemedicine).  Patients are able to view lab/test results, encounter notes, upcoming appointments, etc.  Non-urgent messages can be sent to your provider as well.   To learn more about what you can do with MyChart, go to [email or web address].   Other Instructions

## 2024-10-08 ENCOUNTER — Ambulatory Visit: Admitting: Family

## 2024-10-29 ENCOUNTER — Ambulatory Visit: Admitting: Family

## 2024-11-05 ENCOUNTER — Ambulatory Visit (HOSPITAL_COMMUNITY)
Admission: RE | Admit: 2024-11-05 | Discharge: 2024-11-05 | Disposition: A | Discharge status: Home / Self Care | Source: Ambulatory Visit | Attending: Vascular Surgery | Admitting: Vascular Surgery

## 2024-11-05 ENCOUNTER — Encounter: Payer: Self-pay | Admitting: Physician Assistant

## 2024-11-05 ENCOUNTER — Ambulatory Visit

## 2024-11-05 VITALS — BP 146/91 | HR 73 | Temp 98.0°F

## 2024-11-05 DIAGNOSIS — R600 Localized edema: Secondary | ICD-10-CM

## 2024-11-05 DIAGNOSIS — I872 Venous insufficiency (chronic) (peripheral): Secondary | ICD-10-CM | POA: Insufficient documentation

## 2024-11-05 NOTE — Progress Notes (Signed)
 [facility]           OF [place]  History and Physical   Angela Wang is a 89 y.o. female who presents with BLE pain and swelling.   She states that she has pain in her lower legs that prevents her from walking.  She states that she has neuropathy in both feet and she does go to podiatry in [facility] to have her nails trimmed.  She states that her legs do feel better in the mornings after being in bed with her legs elevated but as the day goes on, her legs get more swollen and painful.  She states that she is unable to wear compression socks because they hurt her legs.  She used to wrap her legs with ace wraps but she is unable to do this and her daughter has back issues and she does not want to hurt her daughter (her daughter also had a recent knee replacement).  She denies any hx of DVT.  She states she needs a left knee replacement and had 2 knee replacements on the right.  She does have skin changes on BLE below the knee.  She does not have any open ulcers.    She was previously seen in 2024 for leg swelling.  She was having increased swelling and pain that would keep her from walking.  She was not wearing compression.  She was not elevating her legs and would sleep with her legs in dependent position in the chair.  She had brisk doppler flow bilateral DP with multiphasic flow.  Her duplex did not reveal any venous insufficiency but was very difficult study.    She was previously evaluated several times in the past for progressive swelling in the past and duplex did reveal venous insufficiency in the right leg at the time.   She had normal ABI in 2022.    The pt is on a statin for cholesterol management.  The pt is not on a daily aspirin.   Other AC:  none The pt is on diuretic for hypertension.   The pt is not on medication for diabetes.   Tobacco hx:  never  Pt does not have family hx of AAA.  Pt has personal hx of coiling of brain aneurysm about 20  years ago.    Past Medical History:  Diagnosis Date   ALLERGIC RHINITIS 06/21/2007   Qualifier: Diagnosis of  By: Georgian ROSALEA CHARM Lamar    Allergy    allergic rhinitis   Anemia    nos   ANEMIA-NOS 06/21/2007   Qualifier: Diagnosis of  By: Georgian ROSALEA CHARM Lamar    ANXIETY 06/21/2007   Qualifier: Diagnosis of  By: Georgian ROSALEA CHARM Lamar    Arthritis    osteoarthritis   Atypical chest pain 09/2008   negative myoview   BACK PAIN, LUMBAR 03/06/2010   Qualifier: Diagnosis of  By: Georgian ROSALEA CHARM Lamar    CEREBRAL ANEURYSM 06/21/2007   Qualifier: History of  By: Georgian ROSALEA CHARM Lamar    CEREBROVASCULAR ACCIDENT, HX OF 06/21/2007   Qualifier: Diagnosis of  By: Georgian ROSALEA CHARM Lamar    CHEST PAIN, ATYPICAL 09/02/2008   Qualifier: Diagnosis of  By: Georgian ROSALEA CHARM Lamar    Chronic sinusitis    CONSTIPATION, CHRONIC 09/20/2009   Qualifier: Diagnosis of  By: Obie MD, Princella HERO    Depression    DEPRESSION 06/21/2007  Qualifier: Diagnosis of  By: Georgian ROSALEA CHARM Lamar    Diverticulosis    DIVERTICULOSIS-COLON 06/09/2008   Qualifier: Diagnosis of  By: Kerman NP, Vina     Fibromyalgia    FIBROMYALGIA 06/21/2007   Qualifier: Diagnosis of  By: Georgian ROSALEA CHARM Lamar SERENE, HX OF 08/25/2008   Qualifier: Diagnosis of  By: Marcelo CMA (AAMA), Dottie     GERD 05/03/2008   Qualifier: Diagnosis of  By: Latisha CMA, Leigh     GERD (gastroesophageal reflux disease)    Headache 03/15/2011   History of solitary pulmonary nodule    right, stable by CT   Hyperlipidemia    HYPERLIPIDEMIA 06/21/2007   Qualifier: Diagnosis of  By: Georgian ROSALEA CHARM Lamar    Hypertension    HYPERTENSION 06/21/2007   Qualifier: Diagnosis of  By: Georgian ROSALEA CHARM Lamar    HYPOGLYCEMIA 10/24/2010   Qualifier: Diagnosis of  By: Georgian ROSALEA CHARM Lamar    HYPOTHYROIDISM 07/22/2008   Qualifier: Diagnosis of  By: Georgian ROSALEA CHARM Lamar    IBS 06/21/2007   Qualifier: Diagnosis of  By: Georgian ROSALEA CHARM Lamar    IBS (irritable bowel syndrome)    INSOMNIA, CHRONIC 07/19/2009    Qualifier: Diagnosis of  By: Georgian ROSALEA CHARM Lamar    LEG PAIN, RIGHT 02/01/2009   Qualifier: Diagnosis of  By: Georgian ROSALEA CHARM Lamar    Leukopenia    chronic   LEUKOPENIA, CHRONIC 06/21/2007   Qualifier: Diagnosis of  By: Georgian ROSALEA CHARM Lamar    NECK PAIN 02/03/2008   Qualifier: Diagnosis of  By: Georgian ROSALEA CHARM Lamar    NUMBNESS 01/29/2008   Qualifier: Diagnosis of  By: Kassie MD, Alyce DELENA Pyles NOS-Unspec 06/21/2007   Centricity Description: DEGENERATIVE JOINT DISEASE, MILD Qualifier: Diagnosis of  By: Georgian ROSALEA CHARM Lamar  Centricity Description: OSTEOARTHRITIS Qualifier: Diagnosis of  By: Georgian ROSALEA CHARM Lamar    PULMONARY NODULE 12/26/2007   Qualifier: Diagnosis of  By: Georgian ROSALEA CHARM Lamar    Routine general medical examination at a health care facility    SCIATICA, RIGHT 05/06/2008   Qualifier: Diagnosis of  By: Vicenta LATHER, Sarah     Stroke [facility])    THROMBOCYTOPENIA 02/09/2009   Qualifier: Diagnosis of  By: Georgian ROSALEA CHARM Lamar    UNSTEADY GAIT 07/28/2009   Qualifier: Diagnosis of  By: Georgian ROSALEA CHARM Lamar    VENOUS INSUFFICIENCY 05/03/2008   Qualifier: Diagnosis of  By: Latisha CMA, Anette      Past Surgical History:  Procedure Laterality Date   ABDOMINAL HYSTERECTOMY     ANEURYSM COILING     APPENDECTOMY     CHOLECYSTECTOMY     JOINT REPLACEMENT     total right knee replacement   KNEE SURGERY      Social History   Socioeconomic History   Marital status: Widowed    Spouse name: Not on file   Number of children: 4   Years of education: Not on file   Highest education level: Not on file  Occupational History   Occupation: retired    Associate Professor: RETIRED    Comment: tobacco company  Tobacco Use   Smoking status: Never   Smokeless tobacco: Never  Vaping Use   Vaping status: Never Used  Substance and Sexual Activity   Alcohol use: Not Currently   Drug use: No   Sexual activity: Not on file  Other Topics Concern   Not on  file  Social History Narrative   Are you right handed or left handed?  right   Are you currently employed ?    What is your current occupation? retired   Do you live at home alone? no   Who lives with you? daughter   What type of home do you live in: 1 story or 2 story? one    Caffeine 1 cup a day   Social Drivers of Health   Tobacco Use: Low Risk (10/06/2024)   Patient History    Smoking Tobacco Use: Never    Smokeless Tobacco Use: Never    Passive Exposure: Not on file  Financial Resource Strain: Not on file  Food Insecurity: Low Risk (03/15/2024)   Received from [facility]   Epic    Within the past 12 months, you worried that your food would run out before you got money to buy more: Never true    Within the past 12 months, the food you bought just didn't last and you didn't have money to get more. : Never true  Transportation Needs: No Transportation Needs (03/15/2024)   Received from [facility]    In the past 12 months, has lack of reliable transportation kept you from medical appointments, meetings, work or from getting things needed for daily living? : No  Physical Activity: Not on file  Stress: Not on file  Social Connections: Not on file  Intimate Partner Violence: Not on file  Depression (PHQ2-9): Low Risk (07/15/2024)   Depression (PHQ2-9)    PHQ-2 Score: 0  Alcohol Screen: Not on file  Housing: Low Risk (03/15/2024)   Received from [facility]   Epic    What is your living situation today?: I have a steady place to live    Think about the place you live. Do you have problems with any of the following? Choose all that apply:: None/None on this list  Utilities: Low Risk (03/15/2024)   Received from [facility]   Utilities    In the past 12 months has the electric, gas, oil, or water company threatened to shut off services in your home? : No  Health Literacy: Not on file     Family History  Problem Relation Age of Onset   Cancer Mother        pancreatic   Coronary artery disease Father    Cancer Sister          breast   Asthma Sister    Breast cancer Sister    Cancer Brother        prostate   Allergies Brother    Cancer Maternal Aunt        colon   Diabetes Other    Cancer Other        ovarian    Current Outpatient Medications  Medication Sig Dispense Refill   ALPRAZolam  (XANAX ) 0.5 MG tablet Take 1 tablet (0.5 mg total) by mouth at bedtime as needed for sleep or anxiety. 30 tablet 3   B Complex Vitamins (B COMPLEX 100 PO) Take 1 tablet by mouth daily.     bisacodyl (DULCOLAX) 5 MG EC tablet as needed for moderate constipation or severe constipation.     Calcium -Magnesium-Vitamin D  500-250-200 MG-MG-UNIT TABS Take 1 tablet by mouth daily.     co-enzyme Q-10 30 MG capsule Take 30 mg by mouth 1 day or 1 dose.     cyclobenzaprine  (FLEXERIL ) 10 MG tablet Take 1 tablet (10 mg total)  by mouth 2 (two) times daily as needed for muscle spasms. 20 tablet 0   diclofenac  Sodium (VOLTAREN ) 1 % GEL Apply 4 g topically 4 (four) times daily. 100 g 0   ezetimibe  (ZETIA ) 10 MG tablet TAKE ONE TABLET BY MOUTH DAILY 90 tablet 3   fluocinonide  cream (LIDEX ) 0.05 % Apply topically every other day. 30 g 3   fluticasone (FLONASE) 50 MCG/ACT nasal spray Place 2 sprays into both nostrils as needed for allergies or rhinitis.     folic acid (FOLVITE) 1 MG tablet Take 1 mg by mouth daily.     furosemide  (LASIX ) 40 MG tablet Take 1 tablet (40 mg total) by mouth daily. 90 tablet 3   ipratropium (ATROVENT) 0.03 % nasal spray as needed for congestion.     iron polysaccharides (NIFEREX) 150 MG capsule Take 150 mg by mouth daily.     lidocaine  (XYLOCAINE ) 5 % ointment Apply topically daily as needed. 35.44 g 3   linaclotide  (LINZESS ) 145 MCG CAPS capsule Take 1 capsule (145 mcg total) by mouth as needed (irritable bowel). 30 capsule 3   Multiple Vitamin (MULTI-VITAMIN) tablet Take 1 tablet by mouth daily.     Olopatadine HCl 0.2 % SOLN Apply 1 drop to eye daily.     omeprazole  (PRILOSEC) 40 MG capsule Take 1 capsule (40  mg total) by mouth daily. 90 capsule 1   pravastatin  (PRAVACHOL ) 20 MG tablet Take 1 tablet (20 mg total) by mouth every evening. 90 tablet 3   promethazine -dextromethorphan (PROMETHAZINE -DM) 6.25-15 MG/5ML syrup Take 5 mLs by mouth as needed.     vitamin C (ASCORBIC ACID) 500 MG tablet Take 500 mg by mouth.     No current facility-administered medications for this visit.    Allergies[1]  REVIEW OF SYSTEMS:   [X]  denotes positive finding, [ ]  denotes negative finding Cardiac  Comments:  Chest pain or chest pressure:    Shortness of breath upon exertion:    Short of breath when lying flat:    Irregular heart rhythm:        Vascular    Pain in calf, thigh, or hip brought on by ambulation:    Pain in feet at night that wakes you up from your sleep:     Blood clot in your veins:    Leg swelling:  x       Pulmonary    Oxygen at home:    Productive cough:     Wheezing:         Neurologic    Sudden weakness in arms or legs:     Sudden numbness in arms or legs:     Sudden onset of difficulty speaking or slurred speech:    Temporary loss of vision in one eye:     Problems with dizziness:         Gastrointestinal    Blood in stool:     Vomited blood:         Genitourinary    Burning when urinating:     Blood in urine:        Psychiatric    Major depression:         Hematologic    Bleeding problems:    Problems with blood clotting too easily:        Skin    Rashes or ulcers:        Constitutional    Fever or chills:      PHYSICAL EXAMINATION:  Today's Vitals  11/05/24 1323  BP: (!) 146/91  Pulse: 73  Temp: 98 F (36.7 C)  TempSrc: Temporal  PainSc: 0-No pain   There is no height or weight on file to calculate BMI.   General:  WDWN in NAD; vital signs documented above Gait: Not observed HENT: WNL, normocephalic Pulmonary: normal non-labored breathing without wheezing Cardiac: regular HR; without carotid bruits Abdomen: soft, NT, aortic pulse is not  palpable Skin: without rashes Vascular Exam/Pulses:  Right Left  Radial 2+ (normal) 2+ (normal)  DP 2+ (normal) 2+ (normal)   Extremities: palpable DP pulses bilaterally; mild BLE swelling with skin changes below the knee as pictured.  No open wounds.    Neurologic: A&O X 3;  moving all extremities equally Psychiatric:  The pt has Normal affect.   Non-Invasive Vascular Imaging:   Venous duplex on 11/05/2024: +--------------+---------+------+-----------+------------+--------+  LEFT         Reflux NoRefluxReflux TimeDiameter cmsComments                          Yes                                   +--------------+---------+------+-----------+------------+--------+  CFV          no                                              +--------------+---------+------+-----------+------------+--------+  FV prox       no                                              +--------------+---------+------+-----------+------------+--------+  FV mid        no                                              +--------------+---------+------+-----------+------------+--------+  FV dist       no                                              +--------------+---------+------+-----------+------------+--------+  Popliteal    no                                              +--------------+---------+------+-----------+------------+--------+  GSV at [facility]    no                            0.26              +--------------+---------+------+-----------+------------+--------+  GSV prox thighno                            0.29              +--------------+---------+------+-----------+------------+--------+  GSV mid thigh  no                            0.34              +--------------+---------+------+-----------+------------+--------+  GSV dist thighno                            0.31               +--------------+---------+------+-----------+------------+--------+  GSV at knee   no                            0.38              +--------------+---------+------+-----------+------------+--------+  GSV prox calf no                            0.26              +--------------+---------+------+-----------+------------+--------+  GSV mid calf  no                            0.23              +--------------+---------+------+-----------+------------+--------+  GSV dist calf no                            0.14              +--------------+---------+------+-----------+------------+--------+  SSV at [facility]    no                            0.44              +--------------+---------+------+-----------+------------+--------+  SSV prox calf no                            0.17              +--------------+---------+------+-----------+------------+--------+  SSV mid calf  no                            0.18              +--------------+---------+------+-----------+------------+--------+      Summary:  Left:  - No evidence of deep vein thrombosis seen in the left lower extremity,  from the common femoral through the popliteal veins.  - No evidence of superficial venous thrombosis in the left lower extremity.  - There is no evidence of venous reflux seen in the left lower extremity.  - No evidence of superficial venous reflux seen in the left greater saphenous vein.  - No evidence of superficial venous reflux seen in the left short saphenous vein.     KYLE STANSELL is a 89 y.o. female who presents with: BLE swelling and pain    -pt has palpable DP pedal pulses bilaterally -in the left lower extremity, the pt does not have evidence of DVT.  Pt does not have venous reflux in the LLE and therefore, not a laser ablation candidate.   -discussed with pt about wearing knee high compression stockings, however, she cannot tolerate these.  I did wrap her legs  with ace wraps  and she states this did feel good on her legs.   Encouraged her to continue to moisturize her feet and continue follow up with podiatry for nail trims. -discussed the importance of leg elevation and how to elevate properly - pt is advised to elevate their legs and a diagram is given to them to demonstrate for pt to lay flat on their back with knees elevated and slightly bent with their feet higher than their knees, which puts their feet higher than their heart for 15 minutes per day.  If pt cannot lay flat, advised to lay as flat as possible.  -pt is advised to continue trying to find an exercise that she can do seated that can help her stay active as she is unable to ambulate.   -discussed importance of weight loss and exercise  -handout with recommendations given -pt will f/u as needed.    Lucie Apt, [facility] [phone]  Clinic MD:  [place]     [1]  Allergies Allergen Reactions   Amitriptyline Hcl Swelling and Other (See Comments)    Headache   Cyclobenzaprine  Swelling   Escitalopram Oxalate     REACTION: Hallucinations  Other Reaction(s): Mental Status Changes, Psychosis   Tramadol  Nausea And Vomiting and Other (See Comments)    Other Reaction(s): GI Intolerance   Furosemide  Nausea And Vomiting    Abdominal pain  Other Reaction(s): GI Intolerance, Other (See Comments)   Hydrocodone Other (See Comments)    Other Reaction(s): GI Intolerance   Hydrocodone-Acetaminophen      REACTION: stomach upset   Other Swelling    Other reaction(s): Other (See Comments) ANASTHESIA   Oxycodone -Acetaminophen      REACTION: Nausea and upset stomach   Terbinafine Other (See Comments)   Aspirin Other (See Comments)    REACTION: stomach upset   Baclofen Other (See Comments)    Other Reaction(s): GI Intolerance   Citalopram Other (See Comments)    Other Reaction(s): GI Intolerance   Codeine  Nausea Only and Other (See Comments)    REACTION:  stomach upset  Other Reaction(s): GI Intolerance   Diazepam      Other Reaction(s): GI Intolerance   Escitalopram Oxalate     Other Reaction(s): GI Intolerance   Gabapentin      GI Upset  Other Reaction(s): GI Intolerance, Other (See Comments)   Hydrocodone-Acetaminophen      Other Reaction(s): GI Intolerance  Headache   Lactulose Other (See Comments)    Passed out  Other Reaction(s): Other (See Comments)   Latex Rash    REACTION: rash REACTION: rash REACTION: rash   Levofloxacin Nausea And Vomiting and Nausea Only    Other Reaction(s): GI Intolerance, Other (See Comments)   Lorazepam Other (See Comments)    REACTION: Throat Swelling  Other Reaction(s): GI Intolerance, Other (See Comments)  Other reaction(s): Other (See Comments), REACTION: Throat Swelling   Moxifloxacin Nausea And Vomiting and Nausea Only    Other Reaction(s): GI Intolerance, Other (See Comments)   Oxycodone  Other (See Comments)    Headache, change in mental status  Other Reaction(s): Mental Status Changes, Other (See Comments), Psychosis  Headache   Pantoprazole Nausea Only and Other (See Comments)    Other Reaction(s): GI Intolerance, Other (See Comments)   Polyethylene Glycol 3350 Other (See Comments)    Makes her bloated  Other Reaction(s): GI Intolerance   Rabeprazole Nausea Only and Other (See Comments)    Other Reaction(s): GI Intolerance   Terfenadine     REACTION: stomach upset  Other Reaction(s): GI Intolerance

## 2024-11-05 NOTE — Patient Instructions (Signed)

## 2024-11-12 ENCOUNTER — Encounter: Payer: Self-pay | Admitting: Adult Health

## 2024-11-12 ENCOUNTER — Ambulatory Visit: Admitting: Adult Health

## 2024-11-12 VITALS — BP 144/72 | HR 80 | Temp 97.5°F | Ht 66.0 in | Wt 187.0 lb

## 2024-11-12 DIAGNOSIS — Z0000 Encounter for general adult medical examination without abnormal findings: Secondary | ICD-10-CM

## 2024-11-12 MED ORDER — FLUOCINONIDE 0.05 % EX CREA: TOPICAL_CREAM | CUTANEOUS | 3 refills | Status: DC

## 2024-11-12 NOTE — Patient Instructions (Addendum)
 Please bring copy of living will, Health Care Power of [place], and Advanced Directive to next office visit.   Angela Wang , Thank you for taking time to come for your Medicare Wellness Visit. I appreciate your ongoing commitment to your health goals. Please review the following plan we discussed and let me know if I can assist you in the future.   These are the goals we discussed:  Goals      Exercise 3x per week (30 min per time)     -  will do exercises given by physical therapist, 30 mins everyday        This is a list of the screening recommended for you and due dates:  Health Maintenance  Topic Date Due   COVID-19 Vaccine (7 - 2025-26 season) 07/27/2024   Flu Shot  02/23/2025*   Osteoporosis screening with Bone Density Scan  05/31/2026*   Medicare Annual Wellness Visit  11/12/2025   DTaP/Tdap/Td vaccine (3 - Td or Tdap) 08/22/2027   Pneumococcal Vaccine for age over 36  Completed   Zoster (Shingles) Vaccine  Completed   Meningitis B Vaccine  Aged Out  *Topic was postponed. The date shown is not the original due date.

## 2024-11-12 NOTE — Progress Notes (Signed)
 Chief Complaint  Patient presents with   Annual Exam    Medicare Wellness      Subjective:   Angela Wang is a 89 y.o. female who presents for a Medicare Annual Wellness Visit.  Fall Screening Falls in the past year?: 0 Number of falls in past year: 0 Was there an injury with Fall?: 0 Fall Risk Category Calculator: 0 Patient Fall Risk Level: Low Fall Risk  Fall Risk Patient at Risk for Falls Due to: No Fall Risks Fall risk Follow up: Falls evaluation completed  Advance Directives (For Healthcare) Does Patient Have a Medical Advance Directive?: Yes Does patient want to make changes to medical advance directive?: No - Patient declined Type of Advance Directive: Healthcare Power of [place]; Living will Copy of Healthcare Power of Attorney in Chart?: No - copy requested Copy of Living Will in Chart?: No - copy requested Out of facility DNR (pink MOST or yellow form) in Chart? (Ambulatory ONLY): No - copy requested    Allergies (verified) Amitriptyline hcl, Cyclobenzaprine , Escitalopram oxalate, Tramadol , Acetaminophen , Furosemide , Hydrocodone, Hydrocodone-acetaminophen , Other, Oxycodone -acetaminophen , Tegaserod, Terbinafine, Aspirin, Baclofen, Citalopram, Codeine , Diazepam , Escitalopram oxalate, Gabapentin , Hydrocodone-acetaminophen , Lactulose, Latex, Levofloxacin, Lorazepam, Moxifloxacin, Oxycodone , Pantoprazole, Polyethylene glycol 3350, Rabeprazole, and Terfenadine   Current Medications (verified) Outpatient Encounter Medications as of 11/12/2024  Medication Sig   ALPRAZolam  (XANAX ) 0.5 MG tablet Take 1 tablet (0.5 mg total) by mouth at bedtime as needed for sleep or anxiety.   B Complex Vitamins (B COMPLEX 100 PO) Take 1 tablet by mouth daily.   bisacodyl (DULCOLAX) 5 MG EC tablet as needed for moderate constipation or severe constipation.   Calcium -Magnesium-Vitamin D  500-250-200 MG-MG-UNIT TABS Take 1 tablet by mouth daily.   co-enzyme Q-10 30 MG capsule Take 30 mg by  mouth 1 day or 1 dose.   cyclobenzaprine  (FLEXERIL ) 10 MG tablet Take 1 tablet (10 mg total) by mouth 2 (two) times daily as needed for muscle spasms.   diclofenac  Sodium (VOLTAREN ) 1 % GEL Apply 4 g topically 4 (four) times daily.   ezetimibe  (ZETIA ) 10 MG tablet TAKE ONE TABLET BY MOUTH DAILY   fluocinonide  cream (LIDEX ) 0.05 % Apply topically every other day.   fluticasone (FLONASE) 50 MCG/ACT nasal spray Place 2 sprays into both nostrils as needed for allergies or rhinitis.   folic acid (FOLVITE) 1 MG tablet Take 1 mg by mouth daily.   furosemide  (LASIX ) 40 MG tablet Take 1 tablet (40 mg total) by mouth daily.   ipratropium (ATROVENT) 0.03 % nasal spray as needed for congestion.   iron polysaccharides (NIFEREX) 150 MG capsule Take 150 mg by mouth daily.   lidocaine  (XYLOCAINE ) 5 % ointment Apply topically daily as needed.   linaclotide  (LINZESS ) 145 MCG CAPS capsule Take 1 capsule (145 mcg total) by mouth as needed (irritable bowel).   Multiple Vitamin (MULTI-VITAMIN) tablet Take 1 tablet by mouth daily.   Olopatadine HCl 0.2 % SOLN Apply 1 drop to eye daily.   omeprazole  (PRILOSEC) 40 MG capsule Take 1 capsule (40 mg total) by mouth daily.   pravastatin  (PRAVACHOL ) 20 MG tablet Take 1 tablet (20 mg total) by mouth every evening.   vitamin C (ASCORBIC ACID) 500 MG tablet Take 500 mg by mouth.   promethazine -dextromethorphan (PROMETHAZINE -DM) 6.25-15 MG/5ML syrup Take 5 mLs by mouth as needed. (Patient not taking: Reported on 11/12/2024)   No facility-administered encounter medications on file as of 11/12/2024.    History: Past Medical History:  Diagnosis Date   ALLERGIC  RHINITIS 06/21/2007   Qualifier: Diagnosis of  By: Georgian ROSALEA CHARM Lamar    Allergy    allergic rhinitis   Anemia    nos   ANEMIA-NOS 06/21/2007   Qualifier: Diagnosis of  By: Georgian ROSALEA CHARM Lamar    ANXIETY 06/21/2007   Qualifier: Diagnosis of  By: Georgian ROSALEA CHARM Lamar    Arthritis    osteoarthritis   Atypical chest pain  09/2008   negative myoview   BACK PAIN, LUMBAR 03/06/2010   Qualifier: Diagnosis of  By: Georgian ROSALEA CHARM Lamar    CEREBRAL ANEURYSM 06/21/2007   Qualifier: History of  By: Georgian ROSALEA CHARM Lamar    CEREBROVASCULAR ACCIDENT, HX OF 06/21/2007   Qualifier: Diagnosis of  By: Georgian ROSALEA CHARM Lamar    CHEST PAIN, ATYPICAL 09/02/2008   Qualifier: Diagnosis of  By: Georgian ROSALEA CHARM Lamar    Chronic sinusitis    CONSTIPATION, CHRONIC 09/20/2009   Qualifier: Diagnosis of  By: Obie MD, Princella HERO    Depression    DEPRESSION 06/21/2007   Qualifier: Diagnosis of  By: Georgian ROSALEA CHARM Lamar    Diverticulosis    DIVERTICULOSIS-COLON 06/09/2008   Qualifier: Diagnosis of  By: Kerman NP, Vina     Fibromyalgia    FIBROMYALGIA 06/21/2007   Qualifier: Diagnosis of  By: Georgian ROSALEA CHARM Lamar SERENE, HX OF 08/25/2008   Qualifier: Diagnosis of  By: Nelson-Smith CMA (AAMA), Dottie     GERD 05/03/2008   Qualifier: Diagnosis of  By: Latisha CMA, Leigh     GERD (gastroesophageal reflux disease)    Headache 03/15/2011   History of solitary pulmonary nodule    right, stable by CT   Hyperlipidemia    HYPERLIPIDEMIA 06/21/2007   Qualifier: Diagnosis of  By: Georgian ROSALEA CHARM Lamar    Hypertension    HYPERTENSION 06/21/2007   Qualifier: Diagnosis of  By: Georgian ROSALEA CHARM Lamar    HYPOGLYCEMIA 10/24/2010   Qualifier: Diagnosis of  By: Georgian ROSALEA CHARM Lamar    HYPOTHYROIDISM 07/22/2008   Qualifier: Diagnosis of  By: Georgian ROSALEA CHARM Lamar    IBS 06/21/2007   Qualifier: Diagnosis of  By: Georgian ROSALEA CHARM Lamar    IBS (irritable bowel syndrome)    INSOMNIA, CHRONIC 07/19/2009   Qualifier: Diagnosis of  By: Georgian ROSALEA CHARM Lamar    LEG PAIN, RIGHT 02/01/2009   Qualifier: Diagnosis of  By: Georgian ROSALEA CHARM Lamar    Leukopenia    chronic   LEUKOPENIA, CHRONIC 06/21/2007   Qualifier: Diagnosis of  By: Georgian ROSALEA CHARM Lamar    NECK PAIN 02/03/2008   Qualifier: Diagnosis of  By: Georgian ROSALEA CHARM Lamar    NUMBNESS 01/29/2008   Qualifier: Diagnosis of  By: Kassie MD, Alyce DELENA Pyles  NOS-Unspec 06/21/2007   Centricity Description: DEGENERATIVE JOINT DISEASE, MILD Qualifier: Diagnosis of  By: Georgian ROSALEA CHARM Lamar  Centricity Description: OSTEOARTHRITIS Qualifier: Diagnosis of  By: Georgian ROSALEA CHARM Lamar    PULMONARY NODULE 12/26/2007   Qualifier: Diagnosis of  By: Georgian ROSALEA CHARM Lamar    Routine general medical examination at a health care facility    SCIATICA, RIGHT 05/06/2008   Qualifier: Diagnosis of  By: Vicenta LATHER, Sarah     Stroke [facility])    THROMBOCYTOPENIA 02/09/2009   Qualifier: Diagnosis of  By: Georgian ROSALEA CHARM Lamar    UNSTEADY GAIT 07/28/2009   Qualifier: Diagnosis of  By: Georgian ROSALEA CHARM Lamar  VENOUS INSUFFICIENCY 05/03/2008   Qualifier: Diagnosis of  By: Latisha CMA, Anette     Past Surgical History:  Procedure Laterality Date   ABDOMINAL HYSTERECTOMY     ANEURYSM COILING     APPENDECTOMY     CHOLECYSTECTOMY     JOINT REPLACEMENT     total right knee replacement   KNEE SURGERY     Family History  Problem Relation Age of Onset   Cancer Mother        pancreatic   Coronary artery disease Father    Cancer Sister         breast   Asthma Sister    Breast cancer Sister    Cancer Brother        prostate   Allergies Brother    Cancer Maternal Aunt        colon   Diabetes Other    Cancer Other        ovarian   Social History   Occupational History   Occupation: retired    Associate Professor: RETIRED    Comment: tobacco company  Tobacco Use   Smoking status: Never   Smokeless tobacco: Never  Vaping Use   Vaping status: Never Used  Substance and Sexual Activity   Alcohol use: Not Currently   Drug use: No   Sexual activity: Not on file   Tobacco Counseling Counseling given: Not Answered  SDOH Screenings   Food Insecurity: Low Risk (03/15/2024)   Received from [facility]  Housing: Low Risk (03/15/2024)   Received from [facility]  Transportation Needs: No Transportation Needs (03/15/2024)   Received from [facility]  Utilities: Low Risk (03/15/2024)    Received from [facility]  Depression (PHQ2-9): Low Risk (11/12/2024)  Tobacco Use: Low Risk (11/12/2024)   See flowsheets for full screening details  Depression Screen PHQ 2 & 9 Depression Scale- Over the past 2 weeks, how often have you been bothered by any of the following problems? Little interest or pleasure in doing things: 0 Feeling down, depressed, or hopeless (PHQ Adolescent also includes...irritable): 0 PHQ-2 Total Score: 0 Trouble falling or staying asleep, or sleeping too much: 0 Feeling tired or having little energy: 0 Poor appetite or overeating (PHQ Adolescent also includes...weight loss): 0 Feeling bad about yourself - or that you are a failure or have let yourself or your family down: 0 Trouble concentrating on things, such as reading the newspaper or watching television (PHQ Adolescent also includes...like school work): 0 Moving or speaking so slowly that other people could have noticed. Or the opposite - being so fidgety or restless that you have been moving around a lot more than usual: 0 Thoughts that you would be better off dead, or of hurting yourself in some way: 0 PHQ-9 Total Score: 0 If you checked off any problems, how difficult have these problems made it for you to do your work, take care of things at home, or get along with other people?: Not difficult at all  Depression Treatment Depression Interventions/Treatment : Medication     Goals Addressed   None          Objective:    Today's Vitals   11/12/24 1335  BP: (!) 144/72  Pulse: 80  Temp: (!) 97.5 F (36.4 C)  SpO2: 98%  Weight: 187 lb (84.8 kg)  Height: 5' 6 (1.676 m)   Body mass index is 30.18 kg/m.  Hearing/Vision screen Hearing Screening - Comments:: Patient denies any hearing issues.  Vision Screening -  Comments:: PATIENT UP TO DATE 2025 Immunizations and Health Maintenance Health Maintenance  Topic Date Due   COVID-19 Vaccine (7 - 2025-26 season) 07/27/2024   Influenza  Vaccine  02/23/2025 (Originally 06/26/2024)   Bone Density Scan  05/31/2026 (Originally 03/17/1998)   Medicare Annual Wellness (AWV)  11/12/2025   DTaP/Tdap/Td (3 - Td or Tdap) 08/22/2027   Pneumococcal Vaccine: 50+ Years  Completed   Zoster Vaccines- Shingrix  Completed   Meningococcal B Vaccine  Aged Out         Assessment/Plan:  This is a routine wellness examination for Roselin.  Patient Care Team: Ngetich, Dinah C, NP as PCP - General (Family Medicine) Santo Stanly LABOR, MD as PCP - Cardiology (Cardiology)  I have personally reviewed and noted the following in the patients chart:   Medical and social history Use of alcohol, tobacco or illicit drugs  Current medications and supplements including opioid prescriptions. Functional ability and status Nutritional status Physical activity Advanced directives List of other physicians Hospitalizations, surgeries, and ER visits in previous 12 months Vitals Screenings to include cognitive, depression, and falls Referrals and appointments  No orders of the defined types were placed in this encounter.  In addition, I have reviewed and discussed with patient certain preventive protocols, quality metrics, and best practice recommendations. A written personalized care plan for preventive services as well as general preventive health recommendations were provided to patient.   Donie Moulton Medina-Vargas, NP   11/12/2024   Return in 1 year (on 11/12/2025).  After Visit Summary: (In Person-Printed) AVS printed and given to the patient  Nurse Notes:  Pls schedule your next annual wellness visit.

## 2024-12-08 ENCOUNTER — Other Ambulatory Visit: Payer: Self-pay | Admitting: Family

## 2024-12-08 MED ORDER — FLUOCINONIDE 0.05 % EX CREA: TOPICAL_CREAM | CUTANEOUS | 1 refills | Status: AC

## 2024-12-08 NOTE — Telephone Encounter (Signed)
 Copied from CRM [id number]. Topic: Clinical - Medication Refill >> Dec 08, 2024 11:53 AM Chiquita SQUIBB wrote: Medication: fluocinonide  cream (LIDEX ) 0.05 %   Has the patient contacted their pharmacy? Yes (Agent: If no, request that the patient contact the pharmacy for the refill. If patient does not wish to contact the pharmacy document the reason why and proceed with request.) (Agent: If yes, when and what did the pharmacy advise?)  This is the patient's preferred pharmacy:  [facility] [place], [place] - [address] [place] [address] Phone: [phone] Fax: [phone]   Is this the correct pharmacy for this prescription? Yes If no, delete pharmacy and type the correct one.   Has the prescription been filled recently? No  Is the patient out of the medication? Yes  Has the patient been seen for an appointment in the last year OR does the patient have an upcoming appointment? Yes  Can we respond through MyChart? Yes  Agent: Please be advised that Rx refills may take up to 3 business days. We ask that you follow-up with your pharmacy.

## 2025-01-13 ENCOUNTER — Ambulatory Visit: Payer: Self-pay | Admitting: Family

## 2025-11-15 ENCOUNTER — Ambulatory Visit: Admitting: Family
